# Patient Record
Sex: Female | Born: 1935
Health system: Southern US, Community
[De-identification: ages and names within clinical notes are randomized; demographics above are authoritative.]

## PROBLEM LIST (undated history)

## (undated) DIAGNOSIS — K219 Gastro-esophageal reflux disease without esophagitis: Secondary | ICD-10-CM

## (undated) DIAGNOSIS — K449 Diaphragmatic hernia without obstruction or gangrene: Secondary | ICD-10-CM

## (undated) DIAGNOSIS — M722 Plantar fascial fibromatosis: Secondary | ICD-10-CM

## (undated) DIAGNOSIS — E042 Nontoxic multinodular goiter: Secondary | ICD-10-CM

## (undated) DIAGNOSIS — I6529 Occlusion and stenosis of unspecified carotid artery: Secondary | ICD-10-CM

## (undated) DIAGNOSIS — Z0000 Encounter for general adult medical examination without abnormal findings: Secondary | ICD-10-CM

## (undated) DIAGNOSIS — I10 Essential (primary) hypertension: Secondary | ICD-10-CM

## (undated) DIAGNOSIS — J439 Emphysema, unspecified: Secondary | ICD-10-CM

## (undated) DIAGNOSIS — I209 Angina pectoris, unspecified: Secondary | ICD-10-CM

## (undated) DIAGNOSIS — E782 Mixed hyperlipidemia: Secondary | ICD-10-CM

## (undated) DIAGNOSIS — C801 Malignant (primary) neoplasm, unspecified: Secondary | ICD-10-CM

## (undated) DIAGNOSIS — N135 Crossing vessel and stricture of ureter without hydronephrosis: Secondary | ICD-10-CM

## (undated) DIAGNOSIS — E7800 Pure hypercholesterolemia, unspecified: Secondary | ICD-10-CM

## (undated) DIAGNOSIS — I671 Cerebral aneurysm, nonruptured: Secondary | ICD-10-CM

## (undated) DIAGNOSIS — K635 Polyp of colon: Secondary | ICD-10-CM

## (undated) DIAGNOSIS — D7589 Other specified diseases of blood and blood-forming organs: Secondary | ICD-10-CM

## (undated) DIAGNOSIS — R0600 Dyspnea, unspecified: Secondary | ICD-10-CM

## (undated) DIAGNOSIS — R911 Solitary pulmonary nodule: Principal | ICD-10-CM

## (undated) DIAGNOSIS — I479 Paroxysmal tachycardia, unspecified: Secondary | ICD-10-CM

## (undated) DIAGNOSIS — I471 Supraventricular tachycardia: Secondary | ICD-10-CM

## (undated) HISTORY — PX: CATARACT EXTRACTION W/ INTRAOCULAR LENS  IMPLANT, BILATERAL: SHX1307

## (undated) HISTORY — DX: Encounter for general adult medical examination without abnormal findings: Z00.00

## (undated) HISTORY — PX: TUBAL LIGATION: SHX77

## (undated) HISTORY — DX: Mixed hyperlipidemia: E78.2

## (undated) HISTORY — DX: Essential (primary) hypertension: I10

## (undated) HISTORY — DX: Supraventricular tachycardia: I47.1

## (undated) HISTORY — DX: Diaphragmatic hernia without obstruction or gangrene: K44.9

## (undated) HISTORY — DX: Gastro-esophageal reflux disease without esophagitis: K21.9

## (undated) HISTORY — DX: Pure hypercholesterolemia, unspecified: E78.00

## (undated) HISTORY — DX: Solitary pulmonary nodule: R91.1

## (undated) HISTORY — DX: Polyp of colon: K63.5

## (undated) HISTORY — DX: Cerebral aneurysm, nonruptured: I67.1

## (undated) HISTORY — DX: Occlusion and stenosis of unspecified carotid artery: I65.29

## (undated) HISTORY — DX: Plantar fascial fibromatosis: M72.2

## (undated) HISTORY — PX: CERVIX LESION DESTRUCTION: SHX591

## (undated) HISTORY — PX: BREAST LUMPECTOMY: SHX2

## (undated) HISTORY — DX: Other specified diseases of blood and blood-forming organs: D75.89

## (undated) HISTORY — DX: Paroxysmal tachycardia, unspecified: I47.9

## (undated) HISTORY — PX: BREAST EXCISIONAL BIOPSY: SUR124

---

## 2002-01-25 HISTORY — PX: CEREBRAL ANEURYSM REPAIR: SHX164

## 2004-07-13 ENCOUNTER — Ambulatory Visit: Payer: Self-pay | Admitting: Internal Medicine

## 2004-10-21 ENCOUNTER — Ambulatory Visit: Payer: Self-pay | Admitting: Unknown Physician Specialty

## 2005-04-20 ENCOUNTER — Other Ambulatory Visit: Payer: Self-pay

## 2005-04-20 ENCOUNTER — Emergency Department: Payer: Self-pay | Admitting: Emergency Medicine

## 2005-07-16 ENCOUNTER — Ambulatory Visit: Payer: Self-pay | Admitting: Internal Medicine

## 2006-02-22 ENCOUNTER — Ambulatory Visit: Payer: Self-pay | Admitting: Internal Medicine

## 2006-07-19 ENCOUNTER — Ambulatory Visit: Payer: Self-pay | Admitting: Internal Medicine

## 2007-07-20 ENCOUNTER — Ambulatory Visit: Payer: Self-pay | Admitting: Internal Medicine

## 2009-07-23 ENCOUNTER — Ambulatory Visit: Payer: Self-pay | Admitting: Internal Medicine

## 2010-08-06 ENCOUNTER — Ambulatory Visit: Payer: Self-pay | Admitting: Internal Medicine

## 2011-08-09 ENCOUNTER — Ambulatory Visit: Payer: Self-pay | Admitting: Internal Medicine

## 2011-11-03 ENCOUNTER — Ambulatory Visit: Payer: Self-pay | Admitting: Obstetrics and Gynecology

## 2011-11-03 LAB — BASIC METABOLIC PANEL
Calcium, Total: 8.9 mg/dL (ref 8.5–10.1)
Chloride: 108 mmol/L — ABNORMAL HIGH (ref 98–107)
EGFR (African American): >60
EGFR (Non-African Amer.): >60
Glucose: 79 mg/dL (ref 65–99)
Potassium: 3.7 mmol/L (ref 3.5–5.1)
Sodium: 143 mmol/L (ref 136–145)

## 2011-11-03 LAB — HEMOGLOBIN: HGB: 14.7 g/dL (ref 12.0–16.0)

## 2011-11-12 ENCOUNTER — Ambulatory Visit: Payer: Self-pay | Admitting: Obstetrics and Gynecology

## 2012-05-23 ENCOUNTER — Ambulatory Visit: Payer: Self-pay | Admitting: Internal Medicine

## 2012-08-09 ENCOUNTER — Ambulatory Visit: Payer: Self-pay | Admitting: Internal Medicine

## 2012-09-18 ENCOUNTER — Ambulatory Visit: Payer: Self-pay | Admitting: Unknown Physician Specialty

## 2012-11-29 ENCOUNTER — Ambulatory Visit: Payer: Self-pay | Admitting: Ophthalmology

## 2013-03-07 ENCOUNTER — Ambulatory Visit: Payer: Self-pay | Admitting: Ophthalmology

## 2013-06-19 DIAGNOSIS — I671 Cerebral aneurysm, nonruptured: Secondary | ICD-10-CM | POA: Insufficient documentation

## 2013-06-19 DIAGNOSIS — I6529 Occlusion and stenosis of unspecified carotid artery: Secondary | ICD-10-CM

## 2013-06-19 DIAGNOSIS — I471 Supraventricular tachycardia: Secondary | ICD-10-CM

## 2013-06-19 DIAGNOSIS — I4719 Other supraventricular tachycardia: Secondary | ICD-10-CM | POA: Insufficient documentation

## 2013-06-19 DIAGNOSIS — K219 Gastro-esophageal reflux disease without esophagitis: Secondary | ICD-10-CM | POA: Insufficient documentation

## 2013-06-19 HISTORY — DX: Other supraventricular tachycardia: I47.19

## 2013-06-19 HISTORY — DX: Supraventricular tachycardia: I47.1

## 2013-06-19 HISTORY — DX: Occlusion and stenosis of unspecified carotid artery: I65.29

## 2013-07-16 ENCOUNTER — Ambulatory Visit: Payer: Self-pay | Admitting: Internal Medicine

## 2013-08-23 ENCOUNTER — Ambulatory Visit: Payer: Self-pay | Admitting: Internal Medicine

## 2014-01-25 DIAGNOSIS — C3490 Malignant neoplasm of unspecified part of unspecified bronchus or lung: Secondary | ICD-10-CM

## 2014-01-25 HISTORY — DX: Malignant neoplasm of unspecified part of unspecified bronchus or lung: C34.90

## 2014-02-05 ENCOUNTER — Ambulatory Visit: Payer: Self-pay | Admitting: Internal Medicine

## 2014-02-13 ENCOUNTER — Ambulatory Visit: Payer: Self-pay | Admitting: Internal Medicine

## 2014-02-14 ENCOUNTER — Ambulatory Visit: Payer: Self-pay | Admitting: Cardiothoracic Surgery

## 2014-02-19 DIAGNOSIS — R918 Other nonspecific abnormal finding of lung field: Secondary | ICD-10-CM

## 2014-02-25 ENCOUNTER — Ambulatory Visit: Payer: Self-pay | Admitting: Cardiothoracic Surgery

## 2014-02-28 LAB — COMPREHENSIVE METABOLIC PANEL
ANION GAP: 9 (ref 7–16)
Albumin: 3.8 g/dL (ref 3.4–5.0)
Alkaline Phosphatase: 68 U/L (ref 46–116)
BILIRUBIN TOTAL: 0.5 mg/dL (ref 0.2–1.0)
BUN: 15 mg/dL (ref 7–18)
CHLORIDE: 104 mmol/L (ref 98–107)
Calcium, Total: 9.2 mg/dL (ref 8.5–10.1)
Co2: 31 mmol/L (ref 21–32)
Creatinine: 0.82 mg/dL (ref 0.60–1.30)
EGFR (African American): >60
EGFR (Non-African Amer.): >60
Glucose: 109 mg/dL — ABNORMAL HIGH (ref 65–99)
Osmolality: 288 (ref 275–301)
POTASSIUM: 4.1 mmol/L (ref 3.5–5.1)
SGOT(AST): 28 U/L (ref 15–37)
SGPT (ALT): 37 U/L (ref 14–63)
SODIUM: 144 mmol/L (ref 136–145)
TOTAL PROTEIN: 7 g/dL (ref 6.4–8.2)

## 2014-02-28 LAB — CBC CANCER CENTER
BASOS ABS: 0 x10 3/mm (ref 0.0–0.1)
Basophil %: 0.6 %
EOS ABS: 0.2 x10 3/mm (ref 0.0–0.7)
EOS PCT: 3.8 %
HCT: 43.7 % (ref 35.0–47.0)
HGB: 14.8 g/dL (ref 12.0–16.0)
LYMPHS PCT: 29.8 %
Lymphocyte #: 1.5 x10 3/mm (ref 1.0–3.6)
MCH: 34.3 pg — ABNORMAL HIGH (ref 26.0–34.0)
MCHC: 33.8 g/dL (ref 32.0–36.0)
MCV: 102 fL — AB (ref 80–100)
MONOS PCT: 8.9 %
Monocyte #: 0.4 x10 3/mm (ref 0.2–0.9)
NEUTROS ABS: 2.9 x10 3/mm (ref 1.4–6.5)
Neutrophil %: 56.9 %
Platelet: 176 x10 3/mm (ref 150–440)
RBC: 4.3 10*6/uL (ref 3.80–5.20)
RDW: 12.6 % (ref 11.5–14.5)
WBC: 5 x10 3/mm (ref 3.6–11.0)

## 2014-02-28 LAB — PROTIME-INR
INR: 1
Prothrombin Time: 13.4 secs

## 2014-02-28 LAB — APTT: Activated PTT: 25.8 secs (ref 23.6–35.9)

## 2014-03-07 ENCOUNTER — Ambulatory Visit: Payer: Self-pay

## 2014-03-13 ENCOUNTER — Inpatient Hospital Stay: Payer: Self-pay

## 2014-03-13 DIAGNOSIS — C3411 Malignant neoplasm of upper lobe, right bronchus or lung: Secondary | ICD-10-CM | POA: Insufficient documentation

## 2014-03-13 HISTORY — PX: OTHER SURGICAL HISTORY: SHX169

## 2014-03-26 ENCOUNTER — Ambulatory Visit
Admit: 2014-03-26 | Disposition: A | Discharge status: Home / Self Care | Payer: Self-pay | Attending: Cardiothoracic Surgery | Admitting: Cardiothoracic Surgery

## 2014-03-31 DIAGNOSIS — I479 Paroxysmal tachycardia, unspecified: Secondary | ICD-10-CM

## 2014-03-31 HISTORY — DX: Paroxysmal tachycardia, unspecified: I47.9

## 2014-04-26 ENCOUNTER — Ambulatory Visit
Admit: 2014-04-26 | Disposition: A | Discharge status: Home / Self Care | Payer: Self-pay | Attending: Cardiothoracic Surgery | Admitting: Cardiothoracic Surgery

## 2014-05-14 NOTE — Op Note (Signed)
PATIENT NAMEMACARENA, Heather [name] MR#:  [id number] DATE OF BIRTH:  1935/08/05  DATE OF PROCEDURE:  11/12/2011  PREOPERATIVE DIAGNOSIS: Postmenopausal bleeding.   POSTOPERATIVE DIAGNOSIS: Postmenopausal bleeding.   PROCEDURES:  1. Fractional dilation and curettage.  2. Hysteroscopy.   ANESTHESIA: General endotracheal anesthesia.   SURGEON: Boykin Nearing, MD    INDICATIONS: This is a 79 year old gravida 2 para 2 patient with postmenopausal bleeding, possible polyps on endometrial biopsy and proliferative endometrium noted.   PROCEDURE: After adequate general endotracheal anesthesia, the patient was placed in the dorsal supine position with the legs in candy-cane stirrups. The patient had previously received 2 grams Ancef and 90 mg of gentamicin intravenously prior to commencement of the case. The patient's bladder was catheterized. A weighted speculum was placed in the posterior vaginal vault. Anterior cervix was grasped with a single-tooth tenaculum. Uterus sounds to 7.5 cm. Endocervical curettage was performed followed by dilation of the cervix with Hanks dilator to #17 Hanks dilator. Hysteroscope was advanced into the endometrial cavity under direct visualization with lactated Ringer's distending medium. Endometrial cavity was smooth and no evidence of polyps. No defects noted. Pictures taken. Hysteroscope was removed and sharp curettage performed with scant tissue removed. Good hemostasis was noted. The procedure was terminated. There were no complications. Estimated blood loss minimal. Intraoperative fluids 600 mL. The patient tolerated the procedure well and was taken to the recovery room in good condition.   ____________________________ Boykin Nearing, MD tjs:drc D: 11/12/2011 08:19:15 ET T: 11/12/2011 10:48:13 ET JOB#: [id number] cc: Boykin Nearing, MD, <Dictator> Boykin Nearing MD ELECTRONICALLY SIGNED 11/13/2011 10:30

## 2014-05-20 LAB — SURGICAL PATHOLOGY

## 2014-05-26 NOTE — Consult Note (Signed)
PATIENT NAMEMARCHELLA, [name] MR#:  [id number] DATE OF BIRTH:  [birth year]  DATE OF CONSULTATION:  03/13/2014  CONSULTING PHYSICIAN:  Phelicia Dantes R. Trigo Winterbottom, MD  PRIMARY CARE PHYSICIAN: Emily Filbert, MD  CONSULTING PHYSICIAN:  Nestor Lewandowsky, MD  REASON FOR CONSULTATION: Hypertension and emphysema.   HISTORY OF PRESENTING ILLNESS: A 79 year old Caucasian female patient with history of hypertension, chronic obstructive pulmonary disease, who was recently diagnosed with right upper lobe mass in her lung, most consistent with an adenocarcinoma is admitted to the Critical Care Unit by Dr. Genevive Bi status post thoracotomy with a chest tube in place. The patient mentions that she is pain-free but on minimal movement she has significant pain. Does not complain of any significant shortness of breath. She does have chronic mild cough which nonproductive. Her blood pressure has been fairly controlled as outpatient. She quit smoking when she was 52. She is presently retired. Ambulates on her own.   Today her blood pressure after surgery is 129/64 with pulse of 73 and she is saturating 93% on 2 liters oxygen.   PAST MEDICAL HISTORY:  1.  Hypertension.  2.  Aneurysm.  3.  Hyperlipidemia.  4.  Chronic obstructive pulmonary disease.  5.  Past tobacco abuse.  6.  Right upper lobe lung mass consistent with adenocarcinoma.   ALLERGIES: DEMEROL AND IODINATED CONTRAST.   HOME MEDICATIONS: Zyrtec 10 mg daily, Zantac 150 mg 2 times a day, Hydrochlorothiazide 25 mg daily, Coenzyme Q 10 mg daily, Folic acid 8,144 International Units daily, Atorvastatin 40 mg daily, Atenolol 50 mg daily.  FAMILY HISTORY: Negative for any lung, colon, or breast cancer. Mother deceased of alcohol related complications. Father deceased from myocardial infarction.   REVIEW OF SYSTEMS: CONSTITUTIONAL:  Some fatigue.  EYES: No blurred vision, pain, or redness.  EARS, NOSE, AND THROAT: No tinnitus, ear pain, or hearing loss.   RESPIRATORY:  No  cough, wheeze, or hematemesis.  CARDIOVASCULAR: Has chest pain from her chest tube.  RESPIRATORY: Has chronic obstructive pulmonary disease.  GASTROINTESTINAL: No nausea, vomiting, diarrhea, or abdominal pain.  GENITOURINARY: No dysuria, hematuria, or frequency.  ENDOCRINE: No polyuria, nocturia, or thyroid problems.  HEMATOLOGIC AND LYMPHATIC: No anemia, easy bruising, or bleeding.  INTEGUMENT: No acne, rash, or lesion.  MUSCULOSKELETAL: No back pain or arthritis.  NEUROLOGIC: No focal numbness or weakness.  PSYCHIATRIC: No anxiety or depression.   PHYSICAL EXAMINATION:  VITAL SIGNS: Temperature 97.6, pulse 73, respirations 20, blood pressure 129/64, saturating 92% on 2 liters.  GENERAL: Moderately built Caucasian female, the patient lying in bed, overall seems comfortable.  PSYCHIATRIC: Alert and oriented x 3. Mood and affect appropriate. Judgment intact.  HEENT: Atraumatic, normocephalic. Oral mucosa moist and pink. External ears and nose normal. No pallor or icterus. Pupils are equal and reactive to light.  NECK: Supple. No thyromegaly. No palpable lymph nodes. Trachea midline. No carotid bruits or jugular venous distention.  CARDIOVASCULAR: S1, S2, without any murmurs. Peripheral pulses 2+. No edema.  RESPIRATORY: Normal work of breathing. Clear to auscultation on both sides, has a chest tube in place on the right side.  GASTROINTESTINAL: Soft abdomen, nontender. Bowel sounds present. No organomegaly palpable.  SKIN: Warm and dry. No petechiae, rash, or ulcers.  MUSCULOSKELETAL: No joint swelling, redness, effusion of the large joints. Normal muscle tone.  NEUROLOGICAL:  Moves all 4 extremities equally.   LABORATORY STUDIES: Glucose from today was 179. Recently done CBC and complete metabolic panel have been reviewed, which were normal. Her blood group  is A+. Also CT scan done in January 2016 has been reviewed.   ASSESSMENT AND PLAN:  1.  Hypertension. The patient is presently  started on atenolol which I agree with. We will hold her hydrochlorothiazide. If her blood pressure trends up can restart her hydrochlorothiazide at 25 mg daily. We will add labetalol IV p.r.n. at this point. We will monitor her blood pressure.  2.  Right upper lobe lung mass status post thoracotomy. Further management as per Dr. Genevive Bi with a chest tube.  3.  Deep vein thrombosis prophylaxis. The patient has SCDs in place. I would start Lovenox at the earliest possible time per Dr. Genevive Bi.   Time Spent on this consult was 40 minutes.   ___________________________ [name], MD srs:mc D: 03/13/2014 14:23:47 ET T: 03/13/2014 14:49:39 ET JOB#: [id number]  cc: Alveta Heimlich R. Labrenda Lasky, MD, <Dictator> Timothy E. Genevive Bi, MD Rusty Aus, MD  Neita Carp MD ELECTRONICALLY SIGNED 03/14/2014 16:28

## 2014-05-26 NOTE — Discharge Summary (Signed)
PATIENT NAMEMURANDA, Heather [name] MR#:  [id number] DATE OF BIRTH:  November 21, 1935  DATE OF ADMISSION:  03/13/2014 DATE OF DISCHARGE:  03/19/2014  ADMITTING DIAGNOSIS: Right upper lobe mass.   DISCHARGE DIAGNOSIS: Adenocarcinoma, of the lung T2, N0, M0.   HOSPITAL COURSE: Ms. Heather [name] is a 79 year old female with a recent history of a right upper lobe nodule discovered on x-ray. She had a PET scan done, which suggested a malignancy in the right upper lobe and she was brought to the operating room on 03/13/2014. She underwent a preoperative bronchoscopy, right thoracotomy, and right upper lobe wedge resection. Wedge resection returned a non-small cell carcinoma of the lung with negative margins. Because of the patient's desire to undergo a minimal resection if at all possible we elected to stop at that point. The patient was nursed in the intensive care unit overnight and then monitored on the floor for several days. Her chest tube was ultimately removed on 03/19/2014. Her chest x-ray looked good after chest tube removal and she was discharged to home. At the time of discharge, her wounds were healing as expected. Her chest x-ray showed no pneumothorax or pleural effusion. She was discharged on atenolol, atorvastatin, hydrochlorothiazide, lisinopril, Zyrtec, vitamin D, Zantac, and coenzyme Q10. She was also given a prescription for pain medication and will follow up with Dr. Genevive Bi in one week.    ____________________________ Lew Dawes. Genevive Bi, MD teo:jh D: 03/29/2014 10:16:47 ET T: 03/29/2014 13:03:17 ET JOB#: [id number]  cc: Christia Reading E. Genevive Bi, MD, <Dictator> Louis Matte MD ELECTRONICALLY SIGNED 05/06/2014 12:06

## 2014-05-26 NOTE — Op Note (Signed)
PATIENT NAME:  Heather [name], Heather [name] MR#:  [id number] DATE OF BIRTH:  January 11, 1936  DATE OF PROCEDURE:  03/13/2014  SURGEON: Louis Matte, M.D.   ASSISTANT: Bronson Ing, M.D.   PREOPERATIVE DIAGNOSIS: Right upper lobe mass.  POSTOPERATIVE DIAGNOSIS:  Right upper lobe mass (frozen section consistent with non-small cell carcinoma of the lung).   OPERATION PERFORMED:  1.  Preoperative bronchoscopy to assess endobronchial anatomy.  2.  Right thoracotomy with wedge resection of right upper lobe mass.   INDICATIONS FOR PROCEDURE: [name] is a 79 year old woman with a recent discovery of a right upper lobe mass which was less than a centimeter in size and was PET positive. The indications and risks of the procedure were explained to the patient who gave her informed consent. We discussed all the various options including wedge resection, lobectomy, and nonoperative management including radiation therapy. She elected to proceed with surgery and wedge resection if that was possible.    DESCRIPTION OF PROCEDURE: The patient was brought to the operating suite and placed in the supine position. General endotracheal anesthesia was given with a double-lumen tube. Preoperative bronchoscopy was carried out. This was normal to the subsegmental levels bilaterally. The patient was then turned for a right thoracotomy. All pressure points were carefully padded. The patient was prepped and draped in the usual sterile fashion. A limited lateral 5th interspace thoracotomy was performed. The latissimus muscle was divided but the serratus was spared. The chest was entered through the 5th interspace. We freed up the inferior pulmonary ligament and then had a good fill of the lung. There was a less than 1 cm mass present within the right upper lobe. This was easily palpable. The second smaller rounded lesion that had been seen on the CT scan was not palpable. The fissure between the upper and middle lobes was nonexistent. The tumor was  wedged from the lung and sent for frozen section. The frozen section was consistent with a non-small cell carcinoma. There was a 5 mm margin from the stapled line after the staples were removed. Therefore, this gave [place] approximately an 8 mm margin and I felt comfortable with that. The subcarinal space was opened and there were only a few very small lymph nodes present. There was a very small lymph node in the hilum. These were sent. The pretracheal space had no obvious lymph nodes to sample. The chest was drained with 2 chest tubes. These were secured in the standard fashion. The ribs were reapproximated with #2 Vicryl. The muscles of the chest wall were closed with #2 Vicryl, subcutaneous tissues with 2-0 Vicryl, and the skin with skin clips. The patient tolerated the procedure well and was rolled in the supine position where she was extubated and taken to the recovery room in stable condition.    ____________________________ Lew Dawes Genevive Bi, MD teo:mc D: 03/13/2014 13:59:46 ET T: 03/13/2014 14:25:47 ET JOB#: [id number]  cc: Christia Reading E. Genevive Bi, MD, <Dictator> Louis Matte MD ELECTRONICALLY SIGNED 03/15/2014 9:53

## 2014-06-11 ENCOUNTER — Encounter: Payer: Self-pay | Admitting: *Deleted

## 2014-06-21 ENCOUNTER — Other Ambulatory Visit: Payer: Self-pay

## 2014-06-21 DIAGNOSIS — R911 Solitary pulmonary nodule: Secondary | ICD-10-CM

## 2014-06-21 HISTORY — DX: Solitary pulmonary nodule: R91.1

## 2014-06-25 ENCOUNTER — Other Ambulatory Visit: Payer: Self-pay | Admitting: Internal Medicine

## 2014-06-25 DIAGNOSIS — Z1231 Encounter for screening mammogram for malignant neoplasm of breast: Secondary | ICD-10-CM

## 2014-06-28 ENCOUNTER — Ambulatory Visit
Admission: RE | Admit: 2014-06-28 | Discharge: 2014-06-28 | Disposition: A | Discharge status: Home / Self Care | Payer: PPO | Source: Ambulatory Visit | Attending: Cardiothoracic Surgery | Admitting: Cardiothoracic Surgery

## 2014-06-28 ENCOUNTER — Other Ambulatory Visit: Payer: Self-pay | Admitting: Cardiothoracic Surgery

## 2014-06-28 ENCOUNTER — Inpatient Hospital Stay: Payer: PPO | Attending: Hematology and Oncology

## 2014-06-28 ENCOUNTER — Inpatient Hospital Stay (HOSPITAL_BASED_OUTPATIENT_CLINIC_OR_DEPARTMENT_OTHER): Payer: PPO | Admitting: Hematology and Oncology

## 2014-06-28 VITALS — BP 156/74 | HR 70 | Temp 96.7°F | Wt 148.6 lb

## 2014-06-28 DIAGNOSIS — C3411 Malignant neoplasm of upper lobe, right bronchus or lung: Secondary | ICD-10-CM | POA: Diagnosis not present

## 2014-06-28 DIAGNOSIS — R000 Tachycardia, unspecified: Secondary | ICD-10-CM

## 2014-06-28 DIAGNOSIS — K219 Gastro-esophageal reflux disease without esophagitis: Secondary | ICD-10-CM | POA: Insufficient documentation

## 2014-06-28 DIAGNOSIS — I6529 Occlusion and stenosis of unspecified carotid artery: Secondary | ICD-10-CM

## 2014-06-28 DIAGNOSIS — Z8669 Personal history of other diseases of the nervous system and sense organs: Secondary | ICD-10-CM | POA: Insufficient documentation

## 2014-06-28 DIAGNOSIS — Z87891 Personal history of nicotine dependence: Secondary | ICD-10-CM

## 2014-06-28 DIAGNOSIS — Z79899 Other long term (current) drug therapy: Secondary | ICD-10-CM

## 2014-06-28 DIAGNOSIS — K449 Diaphragmatic hernia without obstruction or gangrene: Secondary | ICD-10-CM | POA: Diagnosis not present

## 2014-06-28 DIAGNOSIS — R911 Solitary pulmonary nodule: Secondary | ICD-10-CM

## 2014-06-28 DIAGNOSIS — Z86010 Personal history of colonic polyps: Secondary | ICD-10-CM | POA: Insufficient documentation

## 2014-06-28 DIAGNOSIS — I10 Essential (primary) hypertension: Secondary | ICD-10-CM | POA: Diagnosis not present

## 2014-06-28 DIAGNOSIS — E780 Pure hypercholesterolemia: Secondary | ICD-10-CM | POA: Diagnosis not present

## 2014-06-28 DIAGNOSIS — Z9889 Other specified postprocedural states: Secondary | ICD-10-CM | POA: Diagnosis not present

## 2014-06-28 LAB — COMPREHENSIVE METABOLIC PANEL
ALT: 26 U/L (ref 14–54)
AST: 27 U/L (ref 15–41)
Albumin: 4.4 g/dL (ref 3.5–5.0)
Alkaline Phosphatase: 60 U/L (ref 38–126)
Anion gap: 5 (ref 5–15)
BUN: 26 mg/dL — ABNORMAL HIGH (ref 6–20)
CO2: 26 mmol/L (ref 22–32)
Calcium: 9.1 mg/dL (ref 8.9–10.3)
Chloride: 104 mmol/L (ref 101–111)
Creatinine, Ser: 0.79 mg/dL (ref 0.44–1.00)
GFR calc Af Amer: >60 mL/min (ref >60)
GFR calc non Af Amer: >60 mL/min (ref >60)
Glucose, Bld: 152 mg/dL — ABNORMAL HIGH (ref 65–99)
Potassium: 3.9 mmol/L (ref 3.5–5.1)
Sodium: 135 mmol/L (ref 135–145)
Total Bilirubin: 0.7 mg/dL (ref 0.3–1.2)
Total Protein: 6.7 g/dL (ref 6.5–8.1)

## 2014-06-28 LAB — CBC
HCT: 45 % (ref 35.0–47.0)
Hemoglobin: 15.1 g/dL (ref 12.0–16.0)
MCH: 33.5 pg (ref 26.0–34.0)
MCHC: 33.6 g/dL (ref 32.0–36.0)
MCV: 99.8 fL (ref 80.0–100.0)
Platelets: 179 10*3/uL (ref 150–440)
RBC: 4.51 MIL/uL (ref 3.80–5.20)
RDW: 13.4 % (ref 11.5–14.5)
WBC: 6.8 10*3/uL (ref 3.6–11.0)

## 2014-06-29 LAB — CEA: CEA: 3.5 ng/mL (ref 0.0–4.7)

## 2014-07-18 ENCOUNTER — Ambulatory Visit
Admission: RE | Admit: 2014-07-18 | Discharge: 2014-07-18 | Disposition: A | Discharge status: Home / Self Care | Payer: PPO | Source: Ambulatory Visit | Attending: Internal Medicine | Admitting: Internal Medicine

## 2014-07-18 ENCOUNTER — Other Ambulatory Visit: Payer: Self-pay | Admitting: Internal Medicine

## 2014-07-18 DIAGNOSIS — Z1231 Encounter for screening mammogram for malignant neoplasm of breast: Secondary | ICD-10-CM

## 2014-07-18 HISTORY — DX: Malignant (primary) neoplasm, unspecified: C80.1

## 2014-09-27 ENCOUNTER — Ambulatory Visit
Admission: RE | Admit: 2014-09-27 | Discharge: 2014-09-27 | Disposition: A | Discharge status: Home / Self Care | Payer: PPO | Source: Ambulatory Visit | Attending: Hematology and Oncology | Admitting: Hematology and Oncology

## 2014-09-27 ENCOUNTER — Ambulatory Visit: Admission: RE | Admit: 2014-09-27 | Payer: PPO | Source: Ambulatory Visit

## 2014-09-27 DIAGNOSIS — J439 Emphysema, unspecified: Secondary | ICD-10-CM | POA: Insufficient documentation

## 2014-09-27 DIAGNOSIS — Z08 Encounter for follow-up examination after completed treatment for malignant neoplasm: Secondary | ICD-10-CM | POA: Insufficient documentation

## 2014-09-27 DIAGNOSIS — R918 Other nonspecific abnormal finding of lung field: Secondary | ICD-10-CM | POA: Insufficient documentation

## 2014-09-27 DIAGNOSIS — N1330 Unspecified hydronephrosis: Secondary | ICD-10-CM | POA: Diagnosis not present

## 2014-09-27 DIAGNOSIS — Z85118 Personal history of other malignant neoplasm of bronchus and lung: Secondary | ICD-10-CM | POA: Diagnosis present

## 2014-09-27 DIAGNOSIS — Z9889 Other specified postprocedural states: Secondary | ICD-10-CM | POA: Diagnosis not present

## 2014-09-27 DIAGNOSIS — C3411 Malignant neoplasm of upper lobe, right bronchus or lung: Secondary | ICD-10-CM

## 2014-09-29 NOTE — Progress Notes (Signed)
[facility] day:  06/28/2014  Chief Complaint: Heather [name] is a 79 y.o. 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  HPI: The patient was last seen in the medical oncology clinic on 03/28/2014.  At that time, she was seen in consultation  regarding adjuvant chemotherapy.  We discussed high risk criteria for stage IB disease including poorly differentiated, vascular invasion, wedge resection or visceral pleural involvement. She had both high-grade tumor as well as visceral pleural involvement. The LACE meta-analysis was discussed as well as data from CALGB 9633 trial.  Side effects of chemotherapy were reviewed. She felt comfortable not receiving therapy given her age and small risk of benefit.  We discussed the plan for close follow-up with CT scans every 6-12 months for the first 2 years then low-dose spiral chest CTs.  We discussed intervening chest x-rays.  At last visit, she was doing well.  Exam was unremarkable except for a fungal rash beneath her breasts.  She was given a prescription for Nystatin.  During the interim, she has continued to do well.  She feels great today. She states that she is going back to the gym.   CXR today reveals post-operative changes on the right with upper lobe scarring.  Past Medical History  Diagnosis Date  . Tachycardia, paroxysmal 03/31/14  . Carotid artery stenosis   . Brain aneurysm   . Colonic polyp   . Hiatal hernia   . GERD (gastroesophageal reflux disease)   . Plantar fasciitis   . Hypertension   . Hypercholesteremia   . Lung nodule 06/21/2014  . Cancer lung    2016    Past Surgical History  Procedure Laterality Date  . Cervix lesion destruction    . Breast lumpectomy      benign  . Tubal ligation    . Breast biopsy Right     neg    Family History  Problem Relation Age of Onset  . Alcohol abuse Mother   . Heart  attack Father     Social History:  reports that she quit smoking about 26 years ago. She does not have any smokeless tobacco history on file. She reports that she drinks alcohol. She reports that she does not use illicit drugs.  The patient is alone today.  Allergies:  Allergies  Allergen Reactions  . Contrast Media [Iodinated Diagnostic Agents]     Current Medications: Current Outpatient Prescriptions  Medication Sig Dispense Refill  . atenolol (TENORMIN) 50 MG tablet Take 50 mg by mouth at bedtime.    Marland Kitchen atorvastatin (LIPITOR) 40 MG tablet Take 40 mg by mouth daily at 6 PM.    . cetirizine (ZYRTEC) 10 MG tablet Take 10 mg by mouth daily.    . Cholecalciferol 1000 UNITS tablet Take 1,000 Units by mouth daily.    . Coenzyme Q10 (CO Q 10) 100 MG CAPS Take 1 capsule by mouth daily.    . hydrochlorothiazide (HYDRODIURIL) 25 MG tablet Take 25 mg by mouth daily.    Marland Kitchen lisinopril-hydrochlorothiazide (PRINZIDE,ZESTORETIC) 20-12.5 MG per tablet Take 1 tablet by mouth daily.    . ranitidine (ZANTAC) 150 MG tablet Take 150 mg by mouth 2 (two) times daily.    Marland Kitchen RESVERATROL PO Take 1 capsule by mouth daily.    Marland Kitchen nystatin (MYCOSTATIN/NYSTOP) 100000 UNIT/GM POWD Apply 1 Bottle topically 3 (three) times daily.  No current facility-administered medications for this visit.    Review of Systems:  GENERAL:  Feels great.  Active.  No fevers, sweats or weight loss. PERFORMANCE STATUS (ECOG):  0 HEENT:  No visual changes, runny nose, sore throat, mouth sores or tenderness. Lungs: No shortness of breath or cough.  No hemoptysis. Cardiac:  No chest pain, palpitations, orthopnea, or PND. GI:  No nausea, vomiting, diarrhea, constipation, melena or hematochezia. GU:  No urgency, frequency, dysuria, or hematuria. Musculoskeletal:  No back pain.  No joint pain.  No muscle tenderness. Extremities:  No pain or swelling. Skin:  No rashes or skin changes. Neuro:  No headache, numbness or weakness, balance or  coordination issues. Endocrine:  No diabetes, thyroid issues, hot flashes or night sweats. Psych:  No mood changes, depression or anxiety. Pain:  No focal pain. Review of systems:  All other systems reviewed and found to be negative.  Physical Exam: Blood pressure 156/74, pulse 70, temperature 96.7 F (35.9 C), temperature source Tympanic, weight 148 lb 9.4 oz (67.4 kg). GENERAL:  Well developed, well nourished, sitting comfortably in the exam room in no acute distress. MENTAL STATUS:  Alert and oriented to person, place and time. HEAD:  Styled auburn hair.  Normocephalic, atraumatic, face symmetric, no Cushingoid features. EYES:  Brown eyes.  Pupils equal round and reactive to light and accomodation.  No conjunctivitis or scleral icterus. ENT:  Oropharynx clear without lesion.  Tongue normal. Mucous membranes moist.  RESPIRATORY:  Clear to auscultation without rales, wheezes or rhonchi. CARDIOVASCULAR:  Regular rate and rhythm without murmur, rub or gallop. ABDOMEN:  Soft, non-tender, with active bowel sounds, and no hepatosplenomegaly.  No masses. SKIN:  No rashes, ulcers or lesions. EXTREMITIES: No edema, no skin discoloration or tenderness.  No palpable cords. LYMPH NODES: No palpable cervical, supraclavicular, axillary or inguinal adenopathy  NEUROLOGICAL: Unremarkable. PSYCH:  Appropriate.  Appointment on 06/28/2014  Component Date Value Ref Range Status  . WBC 06/28/2014 6.8  3.6 - 11.0 K/uL Final  . RBC 06/28/2014 4.51  3.80 - 5.20 MIL/uL Final  . Hemoglobin 06/28/2014 15.1  12.0 - 16.0 g/dL Final  . HCT 06/28/2014 45.0  35.0 - 47.0 % Final  . MCV 06/28/2014 99.8  80.0 - 100.0 fL Final  . MCH 06/28/2014 33.5  26.0 - 34.0 pg Final  . MCHC 06/28/2014 33.6  32.0 - 36.0 g/dL Final  . RDW 06/28/2014 13.4  11.5 - 14.5 % Final  . Platelets 06/28/2014 179  150 - 440 K/uL Final  . Sodium 06/28/2014 135  135 - 145 mmol/L Final  . Potassium 06/28/2014 3.9  3.5 - 5.1 mmol/L Final  .  Chloride 06/28/2014 104  101 - 111 mmol/L Final  . CO2 06/28/2014 26  22 - 32 mmol/L Final  . Glucose, Bld 06/28/2014 152* 65 - 99 mg/dL Final  . BUN 06/28/2014 26* 6 - 20 mg/dL Final  . Creatinine, Ser 06/28/2014 0.79  0.44 - 1.00 mg/dL Final  . Calcium 06/28/2014 9.1  8.9 - 10.3 mg/dL Final  . Total Protein 06/28/2014 6.7  6.5 - 8.1 g/dL Final  . Albumin 06/28/2014 4.4  3.5 - 5.0 g/dL Final  . AST 06/28/2014 27  15 - 41 U/L Final  . ALT 06/28/2014 26  14 - 54 U/L Final  . Alkaline Phosphatase 06/28/2014 60  38 - 126 U/L Final  . Total Bilirubin 06/28/2014 0.7  0.3 - 1.2 mg/dL Final  . GFR calc non Af Amer 06/28/2014 >60  >  60 mL/min Final  . GFR calc Af Amer 06/28/2014 >60  >60 mL/min Final   Comment: (NOTE) The eGFR has been calculated using the CKD EPI equation. This calculation has not been validated in all clinical situations. eGFR's persistently <60 mL/min signify possible Chronic Kidney Disease.   . Anion gap 06/28/2014 5  5 - 15 Final  . CEA 06/28/2014 3.5  0.0 - 4.7 ng/mL Final   Comment: (NOTE)       Roche ECLIA methodology       Nonsmokers  <3.9                                     Smokers     <5.6 Performed At: [facility] [address] [place], [place] [id number] Lindon Romp MD [id number]     Assessment:  JAZSMINE MACARI is a 79 y.o. female with stage IB and adenocarcinoma of the right upper lobe status post wedge resection on 03/13/2014. Pathology revealed a 1.1 cm high-grade adenocarcinoma with pleural invasion. Nodes were negative. Pathologic stage was T2aN1M0 (stage IB).  PET scan on 02/13/2014 revealed a 1 cm right upper lobe hypermetabolic nodule (SUV 3.8) and a 6 mm right upper lobe nodule (no metabolic activity). There was no mediastianl adenopathy.  CXR on 06/28/2014 revealed post-operative changes on the right with upper lobe scarring.   Symptomatically, she is doing well. Exam is unremarkable.  Plan: 1. Labs today:  CBC with diff, CMP,  CEA. 2. Schedule chest CT in 3 months. 3. RTC in 3 months for MD assessment, labs (CBC, CMP, CEA), and review of chest CT.   Lequita Asal, MD  06/28/2014

## 2014-09-30 ENCOUNTER — Encounter: Payer: Self-pay | Admitting: Hematology and Oncology

## 2014-10-01 ENCOUNTER — Inpatient Hospital Stay: Payer: PPO | Attending: Hematology and Oncology | Admitting: Hematology and Oncology

## 2014-10-01 ENCOUNTER — Inpatient Hospital Stay: Payer: PPO

## 2014-10-01 VITALS — BP 154/84 | HR 66 | Temp 98.2°F | Ht 62.0 in | Wt 147.7 lb

## 2014-10-01 DIAGNOSIS — C3411 Malignant neoplasm of upper lobe, right bronchus or lung: Secondary | ICD-10-CM

## 2014-10-01 DIAGNOSIS — K219 Gastro-esophageal reflux disease without esophagitis: Secondary | ICD-10-CM | POA: Diagnosis not present

## 2014-10-01 DIAGNOSIS — M722 Plantar fascial fibromatosis: Secondary | ICD-10-CM | POA: Insufficient documentation

## 2014-10-01 DIAGNOSIS — I6529 Occlusion and stenosis of unspecified carotid artery: Secondary | ICD-10-CM | POA: Diagnosis not present

## 2014-10-01 DIAGNOSIS — E785 Hyperlipidemia, unspecified: Secondary | ICD-10-CM | POA: Insufficient documentation

## 2014-10-01 DIAGNOSIS — Z87891 Personal history of nicotine dependence: Secondary | ICD-10-CM

## 2014-10-01 DIAGNOSIS — K449 Diaphragmatic hernia without obstruction or gangrene: Secondary | ICD-10-CM | POA: Insufficient documentation

## 2014-10-01 DIAGNOSIS — R911 Solitary pulmonary nodule: Secondary | ICD-10-CM

## 2014-10-01 DIAGNOSIS — Z86010 Personal history of colonic polyps: Secondary | ICD-10-CM | POA: Insufficient documentation

## 2014-10-01 DIAGNOSIS — Z79899 Other long term (current) drug therapy: Secondary | ICD-10-CM | POA: Diagnosis not present

## 2014-10-01 DIAGNOSIS — I10 Essential (primary) hypertension: Secondary | ICD-10-CM | POA: Diagnosis not present

## 2014-10-01 DIAGNOSIS — Z8673 Personal history of transient ischemic attack (TIA), and cerebral infarction without residual deficits: Secondary | ICD-10-CM | POA: Insufficient documentation

## 2014-10-01 DIAGNOSIS — D7589 Other specified diseases of blood and blood-forming organs: Secondary | ICD-10-CM

## 2014-10-01 LAB — CBC WITH DIFFERENTIAL/PLATELET
Basophils Absolute: 0 10*3/uL (ref 0–0.1)
Basophils Relative: 0 %
Eosinophils Absolute: 0.3 10*3/uL (ref 0–0.7)
Eosinophils Relative: 5 %
HCT: 43.1 % (ref 35.0–47.0)
Hemoglobin: 14.7 g/dL (ref 12.0–16.0)
Lymphocytes Relative: 35 %
Lymphs Abs: 1.9 10*3/uL (ref 1.0–3.6)
MCH: 34.8 pg — ABNORMAL HIGH (ref 26.0–34.0)
MCHC: 34.1 g/dL (ref 32.0–36.0)
MCV: 101.9 fL — ABNORMAL HIGH (ref 80.0–100.0)
Monocytes Absolute: 0.5 10*3/uL (ref 0.2–0.9)
Monocytes Relative: 8 %
Neutro Abs: 2.8 10*3/uL (ref 1.4–6.5)
Neutrophils Relative %: 52 %
Platelets: 170 10*3/uL (ref 150–440)
RBC: 4.23 MIL/uL (ref 3.80–5.20)
RDW: 13.6 % (ref 11.5–14.5)
WBC: 5.4 10*3/uL (ref 3.6–11.0)

## 2014-10-01 LAB — COMPREHENSIVE METABOLIC PANEL
ALT: 22 U/L (ref 14–54)
AST: 28 U/L (ref 15–41)
Albumin: 4.1 g/dL (ref 3.5–5.0)
Alkaline Phosphatase: 53 U/L (ref 38–126)
Anion gap: 7 (ref 5–15)
BUN: 20 mg/dL (ref 6–20)
CO2: 30 mmol/L (ref 22–32)
Calcium: 8.9 mg/dL (ref 8.9–10.3)
Chloride: 103 mmol/L (ref 101–111)
Creatinine, Ser: 0.82 mg/dL (ref 0.44–1.00)
GFR calc Af Amer: >60 mL/min (ref >60)
GFR calc non Af Amer: >60 mL/min (ref >60)
Glucose, Bld: 88 mg/dL (ref 65–99)
Potassium: 3.9 mmol/L (ref 3.5–5.1)
Sodium: 140 mmol/L (ref 135–145)
Total Bilirubin: 0.7 mg/dL (ref 0.3–1.2)
Total Protein: 6.9 g/dL (ref 6.5–8.1)

## 2014-10-01 NOTE — Progress Notes (Signed)
Patient here for follow up minor complaints of aching at surgical site otherwise feels good.

## 2014-10-01 NOTE — Progress Notes (Signed)
[facility] day:  10/01/2014   Chief Complaint: Heather [name] is a 79 y.o. 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  HPI: The patient was last seen in the medical oncology clinic on 06/28/2014.  At that time, she was doing well.  Exam was unremarkable.  CXR revealed post-operative changes on the right with upper lobe scarring.  Chest CT on 09/27/2014 revealed images consistent with right upper lobe wedge resection.  There was slight pleural parenchymal nodular thickening and mild distortion consistent with post operative scarring.    During the interim, she has felt great. She has been back to the gym and lifting weights. She denies any shortness of breath or cough or other systemic symptom.   Past Medical History  Diagnosis Date  . Tachycardia, paroxysmal 03/31/14  . Carotid artery stenosis   . Brain aneurysm   . Colonic polyp   . Hiatal hernia   . GERD (gastroesophageal reflux disease)   . Plantar fasciitis   . Hypertension   . Hypercholesteremia   . Lung nodule 06/21/2014  . Cancer lung    2016    Past Surgical History  Procedure Laterality Date  . Cervix lesion destruction    . Breast lumpectomy      benign  . Tubal ligation    . Breast biopsy Right     neg    Family History  Problem Relation Age of Onset  . Alcohol abuse Mother   . Heart attack Father     Social History:  reports that she quit smoking about 26 years ago. She does not have any smokeless tobacco history on file. She reports that she drinks alcohol. She reports that she does not use illicit drugs.  The patient is alone today.  Allergies:  Allergies  Allergen Reactions  . Contrast Media [Iodinated Diagnostic Agents]     Current Medications: Current Outpatient Prescriptions  Medication Sig Dispense Refill  . atenolol (TENORMIN) 50 MG tablet Take 50 mg by mouth at bedtime.     Marland Kitchen atorvastatin (LIPITOR) 40 MG tablet Take 40 mg by mouth daily at 6 PM.    . cetirizine (ZYRTEC) 10 MG tablet Take 10 mg by mouth daily.    . Cholecalciferol 1000 UNITS tablet Take 1,000 Units by mouth daily.    . Coenzyme Q10 (CO Q 10) 100 MG CAPS Take 1 capsule by mouth daily.    . hydrochlorothiazide (HYDRODIURIL) 25 MG tablet Take 25 mg by mouth daily.    Marland Kitchen lisinopril-hydrochlorothiazide (PRINZIDE,ZESTORETIC) 20-12.5 MG per tablet Take 1 tablet by mouth daily.    Marland Kitchen RESVERATROL PO Take 1 capsule by mouth daily.    Marland Kitchen nystatin (MYCOSTATIN/NYSTOP) 100000 UNIT/GM POWD Apply 1 Bottle topically 3 (three) times daily.    . ranitidine (ZANTAC) 150 MG tablet Take 150 mg by mouth 2 (two) times daily.     No current facility-administered medications for this visit.    Review of Systems:  GENERAL:  Feels great.  Active.  No fevers, sweats or weight loss. PERFORMANCE STATUS (ECOG):  0 HEENT:  No visual changes, runny nose, sore throat, mouth sores or tenderness. Lungs: No shortness of breath or cough.  No hemoptysis. Cardiac:  No chest pain, palpitations, orthopnea, or PND. GI:  No nausea, vomiting, diarrhea, constipation, melena or hematochezia. GU:  No urgency, frequency, dysuria, or hematuria. Musculoskeletal:  No back pain.  No joint pain.  No muscle tenderness. Extremities:  No pain or swelling. Skin:  No rashes or skin changes. Neuro:  No headache, numbness or weakness, balance or coordination issues. Endocrine:  No diabetes, thyroid issues, hot flashes or night sweats. Psych:  No mood changes, depression or anxiety. Pain:  No focal pain. Review of systems:  All other systems reviewed and found to be negative.  Physical Exam: Blood pressure 154/84, pulse 66, temperature 98.2 F (36.8 C), temperature source Oral, height $RemoveBefo'5\' 2"'[id number]$  (1.575 m), weight 147 lb 11.3 oz (67 kg). GENERAL:  Well developed, well nourished, sitting comfortably in the exam room in no acute distress. MENTAL STATUS:   Alert and oriented to person, place and time. HEAD:  Styled auburn hair.  Normocephalic, atraumatic, face symmetric, no Cushingoid features. EYES:  Brown eyes.  Pupils equal round and reactive to light and accomodation.  No conjunctivitis or scleral icterus. ENT:  Oropharynx clear without lesion.  Tongue normal. Mucous membranes moist.  RESPIRATORY:  Clear to auscultation without rales, wheezes or rhonchi. CARDIOVASCULAR:  Regular rate and rhythm without murmur, rub or gallop. ABDOMEN:  Soft, non-tender, with active bowel sounds, and no hepatosplenomegaly.  No masses. SKIN:  No rashes, ulcers or lesions. EXTREMITIES: No edema, no skin discoloration or tenderness.  No palpable cords. LYMPH NODES: No palpable cervical, supraclavicular, axillary or inguinal adenopathy  NEUROLOGICAL: Unremarkable. PSYCH:  Appropriate.  Appointment on 10/01/2014  Component Date Value Ref Range Status  . WBC 10/01/2014 5.4  3.6 - 11.0 K/uL Final  . RBC 10/01/2014 4.23  3.80 - 5.20 MIL/uL Final  . Hemoglobin 10/01/2014 14.7  12.0 - 16.0 g/dL Final  . HCT 10/01/2014 43.1  35.0 - 47.0 % Final  . MCV 10/01/2014 101.9* 80.0 - 100.0 fL Final  . MCH 10/01/2014 34.8* 26.0 - 34.0 pg Final  . MCHC 10/01/2014 34.1  32.0 - 36.0 g/dL Final  . RDW 10/01/2014 13.6  11.5 - 14.5 % Final  . Platelets 10/01/2014 170  150 - 440 K/uL Final  . Neutrophils Relative % 10/01/2014 52   Final  . Neutro Abs 10/01/2014 2.8  1.4 - 6.5 K/uL Final  . Lymphocytes Relative 10/01/2014 35   Final  . Lymphs Abs 10/01/2014 1.9  1.0 - 3.6 K/uL Final  . Monocytes Relative 10/01/2014 8   Final  . Monocytes Absolute 10/01/2014 0.5  0.2 - 0.9 K/uL Final  . Eosinophils Relative 10/01/2014 5   Final  . Eosinophils Absolute 10/01/2014 0.3  0 - 0.7 K/uL Final  . Basophils Relative 10/01/2014 0   Final  . Basophils Absolute 10/01/2014 0.0  0 - 0.1 K/uL Final  . Sodium 10/01/2014 140  135 - 145 mmol/L Final  . Potassium 10/01/2014 3.9  3.5 - 5.1 mmol/L  Final  . Chloride 10/01/2014 103  101 - 111 mmol/L Final  . CO2 10/01/2014 30  22 - 32 mmol/L Final  . Glucose, Bld 10/01/2014 88  65 - 99 mg/dL Final  . BUN 10/01/2014 20  6 - 20 mg/dL Final  . Creatinine, Ser 10/01/2014 0.82  0.44 - 1.00 mg/dL Final  . Calcium 10/01/2014 8.9  8.9 - 10.3 mg/dL Final  . Total Protein 10/01/2014 6.9  6.5 - 8.1 g/dL Final  . Albumin 10/01/2014 4.1  3.5 - 5.0 g/dL Final  . AST 10/01/2014 28  15 - 41 U/L Final  . ALT 10/01/2014 22  14 - 54 U/L Final  . Alkaline Phosphatase 10/01/2014 53  38 - 126  U/L Final  . Total Bilirubin 10/01/2014 0.7  0.3 - 1.2 mg/dL Final  . GFR calc non Af Amer 10/01/2014 >60  >60 mL/min Final  . GFR calc Af Amer 10/01/2014 >60  >60 mL/min Final   Comment: (NOTE) The eGFR has been calculated using the CKD EPI equation. This calculation has not been validated in all clinical situations. eGFR's persistently <60 mL/min signify possible Chronic Kidney Disease.   . Anion gap 10/01/2014 7  5 - 15 Final    Assessment:  Heather [name] is a 79 y.o. female with stage IB and adenocarcinoma of the right upper lobe status post wedge resection on 03/13/2014. Pathology revealed a 1.1 cm high-grade adenocarcinoma with pleural invasion. Nodes were negative. Pathologic stage was T2aN1M0 (stage IB).  PET scan on 02/13/2014 revealed a 1 cm right upper lobe hypermetabolic nodule (SUV 3.8) and a 6 mm right upper lobe nodule (no metabolic activity). There was no mediastianl adenopathy.  CXR on 06/28/2014 revealed post-operative changes on the right with upper lobe scarring.  Chest CT on 09/27/2014 revealed images consistent with right upper lobe wedge resection.  There was slight pleural parenchymal nodular thickening and mild distortion consistent with post operative scarring.  CEA was 3.5 on 06/28/2014 and 4.7 on 10/01/2014.  Symptomatically, she is doing well. Exam is unremarkable.  She has new macrocytic RBC indices.  Plan: 1. Review chest CT-  done. 2. Labs today:  CBC with diff, CMP, CEA. 3. CXR in 3 months. 4. RTC in 3 months for MD assessment, labs (CBC, CMP, CEA, B12, folate, TSH), and review of CXR.   Lequita Asal, MD  10/01/2014, 10:18 AM

## 2014-10-02 LAB — CEA: CEA: 4.7 ng/mL (ref 0.0–4.7)

## 2014-10-06 ENCOUNTER — Encounter: Payer: Self-pay | Admitting: Hematology and Oncology

## 2014-10-06 DIAGNOSIS — D7589 Other specified diseases of blood and blood-forming organs: Secondary | ICD-10-CM

## 2014-10-06 HISTORY — DX: Other specified diseases of blood and blood-forming organs: D75.89

## 2014-12-31 ENCOUNTER — Inpatient Hospital Stay (HOSPITAL_BASED_OUTPATIENT_CLINIC_OR_DEPARTMENT_OTHER): Payer: PPO | Admitting: Hematology and Oncology

## 2014-12-31 ENCOUNTER — Ambulatory Visit
Admission: RE | Admit: 2014-12-31 | Discharge: 2014-12-31 | Disposition: A | Discharge status: Home / Self Care | Payer: PPO | Source: Ambulatory Visit | Attending: Hematology and Oncology | Admitting: Hematology and Oncology

## 2014-12-31 ENCOUNTER — Inpatient Hospital Stay: Payer: PPO | Attending: Hematology and Oncology

## 2014-12-31 ENCOUNTER — Encounter: Payer: Self-pay | Admitting: Hematology and Oncology

## 2014-12-31 VITALS — BP 140/85 | HR 64 | Temp 96.9°F | Resp 18 | Ht 62.0 in | Wt 149.3 lb

## 2014-12-31 DIAGNOSIS — I517 Cardiomegaly: Secondary | ICD-10-CM | POA: Insufficient documentation

## 2014-12-31 DIAGNOSIS — Z86010 Personal history of colonic polyps: Secondary | ICD-10-CM

## 2014-12-31 DIAGNOSIS — I471 Supraventricular tachycardia: Secondary | ICD-10-CM | POA: Insufficient documentation

## 2014-12-31 DIAGNOSIS — R918 Other nonspecific abnormal finding of lung field: Secondary | ICD-10-CM | POA: Diagnosis not present

## 2014-12-31 DIAGNOSIS — Z09 Encounter for follow-up examination after completed treatment for conditions other than malignant neoplasm: Secondary | ICD-10-CM | POA: Diagnosis present

## 2014-12-31 DIAGNOSIS — K219 Gastro-esophageal reflux disease without esophagitis: Secondary | ICD-10-CM | POA: Insufficient documentation

## 2014-12-31 DIAGNOSIS — Z87891 Personal history of nicotine dependence: Secondary | ICD-10-CM | POA: Diagnosis not present

## 2014-12-31 DIAGNOSIS — Z79899 Other long term (current) drug therapy: Secondary | ICD-10-CM

## 2014-12-31 DIAGNOSIS — M722 Plantar fascial fibromatosis: Secondary | ICD-10-CM

## 2014-12-31 DIAGNOSIS — I10 Essential (primary) hypertension: Secondary | ICD-10-CM

## 2014-12-31 DIAGNOSIS — D7589 Other specified diseases of blood and blood-forming organs: Secondary | ICD-10-CM

## 2014-12-31 DIAGNOSIS — I2510 Atherosclerotic heart disease of native coronary artery without angina pectoris: Secondary | ICD-10-CM | POA: Insufficient documentation

## 2014-12-31 DIAGNOSIS — I6529 Occlusion and stenosis of unspecified carotid artery: Secondary | ICD-10-CM

## 2014-12-31 DIAGNOSIS — R911 Solitary pulmonary nodule: Secondary | ICD-10-CM

## 2014-12-31 DIAGNOSIS — E785 Hyperlipidemia, unspecified: Secondary | ICD-10-CM | POA: Diagnosis not present

## 2014-12-31 DIAGNOSIS — K449 Diaphragmatic hernia without obstruction or gangrene: Secondary | ICD-10-CM | POA: Diagnosis not present

## 2014-12-31 DIAGNOSIS — C3411 Malignant neoplasm of upper lobe, right bronchus or lung: Secondary | ICD-10-CM

## 2014-12-31 DIAGNOSIS — Z9889 Other specified postprocedural states: Secondary | ICD-10-CM | POA: Diagnosis not present

## 2014-12-31 LAB — TSH: TSH: 2.535 u[IU]/mL (ref 0.350–4.500)

## 2014-12-31 LAB — CBC WITH DIFFERENTIAL/PLATELET
Basophils Absolute: 0 10*3/uL (ref 0–0.1)
Basophils Relative: 1 %
Eosinophils Absolute: 0.2 10*3/uL (ref 0–0.7)
Eosinophils Relative: 4 %
HCT: 45.1 % (ref 35.0–47.0)
Hemoglobin: 15.2 g/dL (ref 12.0–16.0)
Lymphocytes Relative: 34 %
Lymphs Abs: 1.7 10*3/uL (ref 1.0–3.6)
MCH: 33.9 pg (ref 26.0–34.0)
MCHC: 33.7 g/dL (ref 32.0–36.0)
MCV: 100.7 fL — ABNORMAL HIGH (ref 80.0–100.0)
Monocytes Absolute: 0.4 10*3/uL (ref 0.2–0.9)
Monocytes Relative: 9 %
Neutro Abs: 2.7 10*3/uL (ref 1.4–6.5)
Neutrophils Relative %: 52 %
Platelets: 147 10*3/uL — ABNORMAL LOW (ref 150–440)
RBC: 4.48 MIL/uL (ref 3.80–5.20)
RDW: 12.6 % (ref 11.5–14.5)
WBC: 5 10*3/uL (ref 3.6–11.0)

## 2014-12-31 LAB — COMPREHENSIVE METABOLIC PANEL
ALT: 25 U/L (ref 14–54)
AST: 29 U/L (ref 15–41)
Albumin: 4.5 g/dL (ref 3.5–5.0)
Alkaline Phosphatase: 59 U/L (ref 38–126)
Anion gap: 7 (ref 5–15)
BUN: 19 mg/dL (ref 6–20)
CO2: 28 mmol/L (ref 22–32)
Calcium: 9.3 mg/dL (ref 8.9–10.3)
Chloride: 102 mmol/L (ref 101–111)
Creatinine, Ser: 0.79 mg/dL (ref 0.44–1.00)
GFR calc Af Amer: >60 mL/min (ref >60)
GFR calc non Af Amer: >60 mL/min (ref >60)
Glucose, Bld: 109 mg/dL — ABNORMAL HIGH (ref 65–99)
Potassium: 4.1 mmol/L (ref 3.5–5.1)
Sodium: 137 mmol/L (ref 135–145)
Total Bilirubin: 0.7 mg/dL (ref 0.3–1.2)
Total Protein: 7.1 g/dL (ref 6.5–8.1)

## 2014-12-31 LAB — VITAMIN B12: Vitamin B-12: 593 pg/mL (ref 180–914)

## 2014-12-31 LAB — FOLATE: Folate: 17.1 ng/mL (ref >5.9)

## 2014-12-31 NOTE — Progress Notes (Signed)
[facility] day:  12/31/2014   Chief Complaint: Heather [name] is a 79 y.o. 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  HPI: The patient was last seen in the medical oncology clinic on 10/01/2014.  At that time, she was doing well. Exam was unremarkable.  Chest CT on 09/27/2014 had revealed slight pleural parenchymal nodular thickening and mild distortion consistent with post operative scarring.  Labs were notable for new macrocytic RBC indices.  CXR on 12/31/2014 revealed a stable right apical postsurgical change most consistent with scarring.  During the interim, she has done well.  She denies any concerns.  Past Medical History  Diagnosis Date  . Tachycardia, paroxysmal 03/31/14  . Carotid artery stenosis   . Brain aneurysm   . Colonic polyp   . Hiatal hernia   . GERD (gastroesophageal reflux disease)   . Plantar fasciitis   . Hypertension   . Hypercholesteremia   . Lung nodule 06/21/2014  . Cancer lung    2016    Past Surgical History  Procedure Laterality Date  . Cervix lesion destruction    . Breast lumpectomy      benign  . Tubal ligation    . Breast biopsy Right     neg    Family History  Problem Relation Age of Onset  . Alcohol abuse Mother   . Heart attack Father     Social History:  reports that she quit smoking about 26 years ago. She does not have any smokeless tobacco history on file. She reports that she drinks alcohol. She reports that she does not use illicit drugs.  The patient is alone today.  Allergies:  Allergies  Allergen Reactions  . Contrast Media [Iodinated Diagnostic Agents]     Current Medications: Current Outpatient Prescriptions  Medication Sig Dispense Refill  . atenolol (TENORMIN) 50 MG tablet Take 50 mg by mouth at bedtime.    . cetirizine (ZYRTEC) 10 MG tablet Take 10 mg by mouth daily.    . Cholecalciferol  1000 UNITS tablet Take 1,000 Units by mouth daily.    . Coenzyme Q10 (CO Q 10) 100 MG CAPS Take 1 capsule by mouth daily.    . hydrochlorothiazide (HYDRODIURIL) 25 MG tablet Take 25 mg by mouth daily.    Marland Kitchen lisinopril-hydrochlorothiazide (PRINZIDE,ZESTORETIC) 20-12.5 MG per tablet Take 1 tablet by mouth daily.    . ranitidine (ZANTAC) 150 MG tablet Take 150 mg by mouth 2 (two) times daily.    Marland Kitchen RESVERATROL PO Take 1 capsule by mouth daily.    . simvastatin (ZOCOR) 20 MG tablet Take 20 mg by mouth daily.    Marland Kitchen nystatin (MYCOSTATIN/NYSTOP) 100000 UNIT/GM POWD Apply 1 Bottle topically 3 (three) times daily.     No current facility-administered medications for this visit.    Review of Systems:  GENERAL:  Feels good.  Active.  No fevers, sweats or weight loss. PERFORMANCE STATUS (ECOG):  0 HEENT:  No visual changes, runny nose, sore throat, mouth sores or tenderness. Lungs: No shortness of breath or cough.  No hemoptysis. Cardiac:  No chest pain, palpitations, orthopnea, or PND. GI:  No nausea, vomiting, diarrhea, constipation, melena or hematochezia. GU:  No urgency, frequency, dysuria, or hematuria. Musculoskeletal:  No back pain.  No joint pain.  No muscle tenderness. Extremities:  No pain or swelling. Skin:  No rashes or skin changes. Neuro:  No headache, numbness or weakness, balance or coordination issues. Endocrine:  No diabetes, thyroid issues, hot flashes or night sweats. Psych:  No mood changes, depression or anxiety. Pain:  No focal pain. Review of systems:  All other systems reviewed and found to be negative.  Physical Exam: Blood pressure 140/85, pulse 64, temperature 96.9 F (36.1 C), temperature source Tympanic, resp. rate 18, height _0  (1.575 m), weight 149 lb 4 oz (67.7 kg). GENERAL:  Well developed, well nourished, sitting comfortably in the exam room in no acute distress. MENTAL STATUS:  Alert and oriented to person, place and time. HEAD:  Styled auburn hair.   Normocephalic, atraumatic, face symmetric, no Cushingoid features. EYES:  Brown eyes.  Pupils equal round and reactive to light and accomodation.  No conjunctivitis or scleral icterus. ENT:  Oropharynx clear without lesion.  Tongue normal. Mucous membranes moist.  RESPIRATORY:  Clear to auscultation without rales, wheezes or rhonchi. CARDIOVASCULAR:  Regular rate and rhythm without murmur, rub or gallop. ABDOMEN:  Soft, non-tender, with active bowel sounds, and no hepatosplenomegaly.  No masses. SKIN:  No rashes, ulcers or lesions. EXTREMITIES: No edema, no skin discoloration or tenderness.  No palpable cords. LYMPH NODES: No palpable cervical, supraclavicular, axillary or inguinal adenopathy  NEUROLOGICAL: Unremarkable. PSYCH:  Appropriate.  Appointment on 12/31/2014  Component Date Value Ref Range Status  . WBC 12/31/2014 5.0  3.6 - 11.0 K/uL Final  . RBC 12/31/2014 4.48  3.80 - 5.20 MIL/uL Final  . Hemoglobin 12/31/2014 15.2  12.0 - 16.0 g/dL Final  . HCT 12/31/2014 45.1  35.0 - 47.0 % Final  . MCV 12/31/2014 100.7* 80.0 - 100.0 fL Final  . MCH 12/31/2014 33.9  26.0 - 34.0 pg Final  . MCHC 12/31/2014 33.7  32.0 - 36.0 g/dL Final  . RDW 12/31/2014 12.6  11.5 - 14.5 % Final  . Platelets 12/31/2014 147* 150 - 440 K/uL Final  . Neutrophils Relative % 12/31/2014 52   Final  . Neutro Abs 12/31/2014 2.7  1.4 - 6.5 K/uL Final  . Lymphocytes Relative 12/31/2014 34   Final  . Lymphs Abs 12/31/2014 1.7  1.0 - 3.6 K/uL Final  . Monocytes Relative 12/31/2014 9   Final  . Monocytes Absolute 12/31/2014 0.4  0.2 - 0.9 K/uL Final  . Eosinophils Relative 12/31/2014 4   Final  . Eosinophils Absolute 12/31/2014 0.2  0 - 0.7 K/uL Final  . Basophils Relative 12/31/2014 1   Final  . Basophils Absolute 12/31/2014 0.0  0 - 0.1 K/uL Final  . Sodium 12/31/2014 137  135 - 145 mmol/L Final  . Potassium 12/31/2014 4.1  3.5 - 5.1 mmol/L Final  . Chloride 12/31/2014 102  101 - 111 mmol/L Final  . CO2  12/31/2014 28  22 - 32 mmol/L Final  . Glucose, Bld 12/31/2014 109* 65 - 99 mg/dL Final  . BUN 12/31/2014 19  6 - 20 mg/dL Final  . Creatinine, Ser 12/31/2014 0.79  0.44 - 1.00 mg/dL Final  . Calcium 12/31/2014 9.3  8.9 - 10.3 mg/dL Final  . Total Protein 12/31/2014 7.1  6.5 - 8.1 g/dL Final  . Albumin 12/31/2014 4.5  3.5 - 5.0 g/dL Final  . AST 12/31/2014 29  15 - 41 U/L Final  . ALT 12/31/2014 25  14 - 54 U/L Final  . Alkaline Phosphatase 12/31/2014 59  38 - 126 U/L Final  . Total Bilirubin 12/31/2014 0.7  0.3 - 1.2 mg/dL Final  . GFR calc non Af  Amer 12/31/2014 >60  >60 mL/min Final  . GFR calc Af Amer 12/31/2014 >60  >60 mL/min Final   Comment: (NOTE) The eGFR has been calculated using the CKD EPI equation. This calculation has not been validated in all clinical situations. eGFR's persistently <60 mL/min signify possible Chronic Kidney Disease.   . Anion gap 12/31/2014 7  5 - 15 Final    Assessment:  Heather [name] is a 79 y.o. female with stage IB and adenocarcinoma of the right upper lobe status post wedge resection on 03/13/2014. Pathology revealed a 1.1 cm high-grade adenocarcinoma with pleural invasion. Nodes were negative. Pathologic stage was T2aN1M0 (stage IB).  PET scan on 02/13/2014 revealed a 1 cm right upper lobe hypermetabolic nodule (SUV 3.8) and a 6 mm right upper lobe nodule (no metabolic activity). There was no mediastianl adenopathy.  CXR on 06/28/2014 revealed post-operative changes on the right with upper lobe scarring.  Chest CT on 09/27/2014 revealed images consistent with right upper lobe wedge resection.  There was slight pleural parenchymal nodular thickening and mild distortion consistent with post operative scarring.  CXR on 12/31/2014 revealed a stable right apical postsurgical change most consistent with scarring.  CEA was 3.5 on 06/28/2014 and 4.7 on 10/01/2014.  Symptomatically, she is doing well. Exam is unremarkable.  She has persistent macrocytic RBC  indices.  Plan: 1.  Review CXR- done. 2.  Labs today:  CBC with diff, CMP, CEA, B12, folate, TSH. 3.  Schedule chest CT without contrast in 3 months- follow-up lung cancer. 4.  RTC in 3 months for MD assessment, labs (CBC, CMP), and review of chest CT.   Lequita Asal, MD  12/31/2014, 9:32 AM

## 2014-12-31 NOTE — Progress Notes (Signed)
Patient is here for follow-up of lung cancer. She states that she has been doing well and has no complaints this morning.

## 2015-01-01 LAB — CEA: CEA: 4.3 ng/mL (ref 0.0–4.7)

## 2015-01-02 ENCOUNTER — Other Ambulatory Visit: Payer: Self-pay

## 2015-01-02 DIAGNOSIS — C3411 Malignant neoplasm of upper lobe, right bronchus or lung: Secondary | ICD-10-CM

## 2015-02-27 DIAGNOSIS — Z86010 Personal history of colonic polyps: Secondary | ICD-10-CM | POA: Diagnosis not present

## 2015-03-31 ENCOUNTER — Ambulatory Visit: Payer: PPO | Admitting: Certified Registered Nurse Anesthetist

## 2015-03-31 ENCOUNTER — Encounter
Admission: RE | Disposition: A | Discharge status: Home / Self Care | Payer: Self-pay | Source: Ambulatory Visit | Attending: Unknown Physician Specialty

## 2015-03-31 ENCOUNTER — Encounter: Payer: Self-pay | Admitting: Anesthesiology

## 2015-03-31 ENCOUNTER — Ambulatory Visit
Admission: RE | Admit: 2015-03-31 | Discharge: 2015-03-31 | Disposition: A | Discharge status: Home / Self Care | Payer: PPO | Source: Ambulatory Visit | Attending: Unknown Physician Specialty | Admitting: Unknown Physician Specialty

## 2015-03-31 DIAGNOSIS — Z91041 Radiographic dye allergy status: Secondary | ICD-10-CM | POA: Insufficient documentation

## 2015-03-31 DIAGNOSIS — Z8249 Family history of ischemic heart disease and other diseases of the circulatory system: Secondary | ICD-10-CM | POA: Insufficient documentation

## 2015-03-31 DIAGNOSIS — E785 Hyperlipidemia, unspecified: Secondary | ICD-10-CM | POA: Insufficient documentation

## 2015-03-31 DIAGNOSIS — K621 Rectal polyp: Secondary | ICD-10-CM | POA: Diagnosis not present

## 2015-03-31 DIAGNOSIS — K635 Polyp of colon: Secondary | ICD-10-CM | POA: Diagnosis not present

## 2015-03-31 DIAGNOSIS — Z87891 Personal history of nicotine dependence: Secondary | ICD-10-CM | POA: Insufficient documentation

## 2015-03-31 DIAGNOSIS — Z1211 Encounter for screening for malignant neoplasm of colon: Secondary | ICD-10-CM | POA: Diagnosis not present

## 2015-03-31 DIAGNOSIS — I471 Supraventricular tachycardia: Secondary | ICD-10-CM | POA: Diagnosis not present

## 2015-03-31 DIAGNOSIS — C3411 Malignant neoplasm of upper lobe, right bronchus or lung: Secondary | ICD-10-CM | POA: Diagnosis not present

## 2015-03-31 DIAGNOSIS — K640 First degree hemorrhoids: Secondary | ICD-10-CM | POA: Insufficient documentation

## 2015-03-31 DIAGNOSIS — Z86010 Personal history of colonic polyps: Secondary | ICD-10-CM | POA: Diagnosis not present

## 2015-03-31 DIAGNOSIS — K219 Gastro-esophageal reflux disease without esophagitis: Secondary | ICD-10-CM | POA: Diagnosis not present

## 2015-03-31 DIAGNOSIS — K6389 Other specified diseases of intestine: Secondary | ICD-10-CM | POA: Diagnosis not present

## 2015-03-31 DIAGNOSIS — I6529 Occlusion and stenosis of unspecified carotid artery: Secondary | ICD-10-CM | POA: Diagnosis not present

## 2015-03-31 DIAGNOSIS — Z8489 Family history of other specified conditions: Secondary | ICD-10-CM | POA: Insufficient documentation

## 2015-03-31 DIAGNOSIS — K5730 Diverticulosis of large intestine without perforation or abscess without bleeding: Secondary | ICD-10-CM | POA: Insufficient documentation

## 2015-03-31 DIAGNOSIS — Z811 Family history of alcohol abuse and dependence: Secondary | ICD-10-CM | POA: Diagnosis not present

## 2015-03-31 DIAGNOSIS — K648 Other hemorrhoids: Secondary | ICD-10-CM | POA: Diagnosis not present

## 2015-03-31 DIAGNOSIS — D124 Benign neoplasm of descending colon: Secondary | ICD-10-CM | POA: Insufficient documentation

## 2015-03-31 DIAGNOSIS — D123 Benign neoplasm of transverse colon: Secondary | ICD-10-CM | POA: Insufficient documentation

## 2015-03-31 DIAGNOSIS — K449 Diaphragmatic hernia without obstruction or gangrene: Secondary | ICD-10-CM | POA: Diagnosis not present

## 2015-03-31 DIAGNOSIS — D128 Benign neoplasm of rectum: Secondary | ICD-10-CM | POA: Diagnosis not present

## 2015-03-31 DIAGNOSIS — Z79899 Other long term (current) drug therapy: Secondary | ICD-10-CM | POA: Diagnosis not present

## 2015-03-31 DIAGNOSIS — K5790 Diverticulosis of intestine, part unspecified, without perforation or abscess without bleeding: Secondary | ICD-10-CM | POA: Diagnosis not present

## 2015-03-31 DIAGNOSIS — Z8679 Personal history of other diseases of the circulatory system: Secondary | ICD-10-CM | POA: Diagnosis not present

## 2015-03-31 HISTORY — PX: COLONOSCOPY WITH PROPOFOL: SHX5780

## 2015-03-31 SURGERY — COLONOSCOPY WITH PROPOFOL
Anesthesia: General

## 2015-03-31 MED ORDER — PROPOFOL 500 MG/50ML IV EMUL
INTRAVENOUS | Status: DC | PRN
  Administered 2015-03-31: 100 ug/kg/min via INTRAVENOUS

## 2015-03-31 MED ORDER — FENTANYL CITRATE (PF) 100 MCG/2ML IJ SOLN
INTRAMUSCULAR | Status: DC | PRN
  Administered 2015-03-31: 50 ug via INTRAVENOUS

## 2015-03-31 MED ORDER — SODIUM CHLORIDE 0.9 % IV SOLN
INTRAVENOUS | Status: DC
Start: 2015-03-31 — End: 2015-03-31
  Administered 2015-03-31: 1000 mL via INTRAVENOUS

## 2015-03-31 MED ORDER — PHENYLEPHRINE HCL 10 MG/ML IJ SOLN
INTRAMUSCULAR | Status: DC | PRN
  Administered 2015-03-31: 200 ug via INTRAVENOUS
  Administered 2015-03-31: 100 ug via INTRAVENOUS
  Administered 2015-03-31: 200 ug via INTRAVENOUS

## 2015-03-31 MED ORDER — PROPOFOL 10 MG/ML IV BOLUS
INTRAVENOUS | Status: DC | PRN
  Administered 2015-03-31: 70 mg via INTRAVENOUS

## 2015-03-31 NOTE — Anesthesia Postprocedure Evaluation (Signed)
Anesthesia Post Note  Patient: Heather [name]  Procedure(s) Performed: Procedure(s) (LRB): COLONOSCOPY WITH PROPOFOL (N/A)  Patient location during evaluation: Endoscopy Anesthesia Type: General Level of consciousness: awake and alert Pain management: pain level controlled Vital Signs Assessment: post-procedure vital signs reviewed and stable Respiratory status: spontaneous breathing, nonlabored ventilation, respiratory function stable and patient connected to nasal cannula oxygen Cardiovascular status: blood pressure returned to baseline and stable Postop Assessment: no signs of nausea or vomiting Anesthetic complications: no    Last Vitals:  Filed Vitals:   03/31/15 1100 03/31/15 1110  BP: 97/50 128/59  Pulse: 68 68  Temp:    Resp: 15 16    Last Pain: There were no vitals filed for this visit.               Miguel Medal S

## 2015-03-31 NOTE — Op Note (Signed)
[facility] Gastroenterology Patient Name: Heather [name] Procedure Date: 03/31/2015 9:54 AM MRN: [id number] Account #: [id number] Date of Birth: 11-Feb-1935 Admit Type: Outpatient Age: 80 Room: [facility] ENDO ROOM 1 Gender: Female Note Status: Finalized Procedure:            Colonoscopy Indications:          High risk colon cancer surveillance: Personal history                        of colonic polyps Providers:            Manya Silvas, MD Referring MD:         Rusty Aus, MD (Referring MD) Medicines:            Propofol per Anesthesia Complications:        No immediate complications. Procedure:            Pre-Anesthesia Assessment:                       - After reviewing the risks and benefits, the patient                        was deemed in satisfactory condition to undergo the                        procedure.                       After obtaining informed consent, the colonoscope was                        passed under direct vision. Throughout the procedure,                        the patient's blood pressure, pulse, and oxygen                        saturations were monitored continuously. The                        Colonoscope was introduced through the anus and                        advanced to the the cecum, identified by appendiceal                        orifice and ileocecal valve. The colonoscopy was                        performed without difficulty. The patient tolerated the                        procedure well. The quality of the bowel preparation                        was excellent. Findings:      Five sessile polyps were found in the rectum, descending colon and       transverse colon. The polyps were diminutive in size. These polyps were       removed with a jumbo cold forceps. Resection and retrieval were complete.  Multiple small-mouthed diverticula were found in the sigmoid colon and       descending colon.      Internal  hemorrhoids were found during endoscopy. The hemorrhoids were       small and Grade I (internal hemorrhoids that do not prolapse).      Mild melanosis coli in left colon.      The exam was otherwise without abnormality. Impression:           - Five diminutive polyps in the rectum, in the                        descending colon and in the transverse colon, removed                        with a jumbo cold forceps. Resected and retrieved.                       - Diverticulosis in the sigmoid colon and in the                        descending colon.                       - Internal hemorrhoids.                       - The examination was otherwise normal. Recommendation:       - Await pathology results. Manya Silvas, MD 03/31/2015 10:47:30 AM This report has been signed electronically. Number of Addenda: 0 Note Initiated On: 03/31/2015 9:54 AM Scope Withdrawal Time: 0 hours 16 minutes 49 seconds  Total Procedure Duration: 0 hours 20 minutes 21 seconds       [facility]

## 2015-03-31 NOTE — Transfer of Care (Signed)
Immediate Anesthesia Transfer of Care Note  Patient: Heather [name]  Procedure(s) Performed: Procedure(s): COLONOSCOPY WITH PROPOFOL (N/A)  Patient Location: PACU  Anesthesia Type:General  Level of Consciousness: awake, alert , oriented and patient cooperative  Airway & Oxygen Therapy: Patient Spontanous Breathing and Patient connected to nasal cannula oxygen  Post-op Assessment: Report given to RN and Post -op Vital signs reviewed and stable  Post vital signs: Reviewed and stable  Last Vitals:  Filed Vitals:   03/31/15 1050 03/31/15 1052  BP: 97/46 97/46  Pulse: 77 79  Temp: 36 C 36 C  Resp: 16 18    Complications: No apparent anesthesia complications

## 2015-03-31 NOTE — Anesthesia Preprocedure Evaluation (Signed)
Anesthesia Evaluation  Patient identified by MRN, date of birth, ID band Patient awake    Reviewed: Allergy & Precautions, NPO status , Patient's Chart, lab work & pertinent test results, reviewed documented beta blocker date and time   Airway Mallampati: II  TM Distance: >3 FB     Dental  (+) Chipped   Pulmonary former smoker,           Cardiovascular hypertension, Pt. on medications and Pt. on home beta blockers      Neuro/Psych  Neuromuscular disease    GI/Hepatic hiatal hernia, GERD  ,  Endo/Other    Renal/GU      Musculoskeletal   Abdominal   Peds  Hematology   Anesthesia Other Findings   Reproductive/Obstetrics                             Anesthesia Physical Anesthesia Plan  ASA: III  Anesthesia Plan: General   Post-op Pain Management:    Induction: Intravenous  Airway Management Planned: Nasal Cannula  Additional Equipment:   Intra-op Plan:   Post-operative Plan:   Informed Consent: I have reviewed the patients History and Physical, chart, labs and discussed the procedure including the risks, benefits and alternatives for the proposed anesthesia with the patient or authorized representative who has indicated his/her understanding and acceptance.     Plan Discussed with: CRNA  Anesthesia Plan Comments:         Anesthesia Quick Evaluation

## 2015-04-01 LAB — SURGICAL PATHOLOGY

## 2015-04-05 DIAGNOSIS — R69 Illness, unspecified: Secondary | ICD-10-CM | POA: Diagnosis not present

## 2015-04-05 DIAGNOSIS — J069 Acute upper respiratory infection, unspecified: Secondary | ICD-10-CM | POA: Diagnosis not present

## 2015-04-07 DIAGNOSIS — J159 Unspecified bacterial pneumonia: Secondary | ICD-10-CM | POA: Diagnosis not present

## 2015-04-07 DIAGNOSIS — I950 Idiopathic hypotension: Secondary | ICD-10-CM | POA: Diagnosis not present

## 2015-04-08 ENCOUNTER — Ambulatory Visit: Payer: PPO

## 2015-04-10 ENCOUNTER — Other Ambulatory Visit: Payer: PPO

## 2015-04-10 ENCOUNTER — Ambulatory Visit: Payer: PPO | Admitting: Hematology and Oncology

## 2015-04-11 MED ORDER — SODIUM CHLORIDE 0.9 % IV SOLN: INTRAVENOUS | Status: DC

## 2015-04-11 NOTE — H&P (Signed)
Primary Care Physician:  Rusty Aus, MD Primary Gastroenterologist:  Dr. Vira Agar  Pre-Procedure History & Physical: HPI:  Heather [name] is a 80 y.o. female is here for an colonoscopy.   Past Medical History  Diagnosis Date  . Tachycardia, paroxysmal ([place]) 03/31/14  . Carotid artery stenosis   . Brain aneurysm   . Colonic polyp   . Hiatal hernia   . GERD (gastroesophageal reflux disease)   . Plantar fasciitis   . Hypertension   . Hypercholesteremia   . Lung nodule 06/21/2014  . Cancer [facility]) lung    2016    Past Surgical History  Procedure Laterality Date  . Cervix lesion destruction    . Breast lumpectomy      benign  . Tubal ligation    . Breast biopsy Right     neg  . Colonoscopy with propofol N/A 03/31/2015    Procedure: COLONOSCOPY WITH PROPOFOL;  Surgeon: Manya Silvas, MD;  Location: [facility] ENDOSCOPY;  Service: Endoscopy;  Laterality: N/A;    Prior to Admission medications   Medication Sig Start Date End Date Taking? Authorizing Provider  atenolol (TENORMIN) 50 MG tablet Take 50 mg by mouth at bedtime.   Yes Historical Provider, MD  cetirizine (ZYRTEC) 10 MG tablet Take 10 mg by mouth daily.   Yes Historical Provider, MD  hydrochlorothiazide (HYDRODIURIL) 25 MG tablet Take 25 mg by mouth daily.   Yes Historical Provider, MD  lisinopril-hydrochlorothiazide (PRINZIDE,ZESTORETIC) 20-12.5 MG per tablet Take 1 tablet by mouth daily.   Yes Historical Provider, MD  Cholecalciferol 1000 UNITS tablet Take 1,000 Units by mouth daily.    Historical Provider, MD  Coenzyme Q10 (CO Q 10) 100 MG CAPS Take 1 capsule by mouth daily.    Historical Provider, MD  nystatin (MYCOSTATIN/NYSTOP) 100000 UNIT/GM POWD Apply 1 Bottle topically 3 (three) times daily.    Historical Provider, MD  ranitidine (ZANTAC) 150 MG tablet Take 150 mg by mouth 2 (two) times daily.    Historical Provider, MD  RESVERATROL PO Take 1 capsule by mouth daily.    Historical Provider, MD  simvastatin (ZOCOR) 20  MG tablet Take 20 mg by mouth daily.    Historical Provider, MD    Allergies as of 03/25/2015 - Review Complete 12/31/2014  Allergen Reaction Noted  . Contrast media [iodinated diagnostic agents]  06/11/2014    Family History  Problem Relation Age of Onset  . Alcohol abuse Mother   . Heart attack Father     Social History   Social History  . Marital Status: Married    Spouse Name: N/A  . Number of Children: N/A  . Years of Education: N/A   Occupational History  . Not on file.   Social History Main Topics  . Smoking status: Former Smoker -- 60 years    Quit date: 06/10/1988  . Smokeless tobacco: Not on file  . Alcohol Use: 0.0 oz/week    0 Standard drinks or equivalent per week     Comment: occasional glass of wine  . Drug Use: No  . Sexual Activity: Not on file   Other Topics Concern  . Not on file   Social History Narrative    Review of Systems: See HPI, otherwise negative ROS  Physical Exam: BP 128/59 mmHg  Pulse 68  Temp(Src) 96.8 F (36 C) (Tympanic)  Resp 16  Ht '5\' 1"'$  (1.549 m)  Wt 63.957 kg (141 lb)  BMI 26.66 kg/m2  SpO2 98% General:  Alert,  pleasant and cooperative in NAD Head:  Normocephalic and atraumatic. Neck:  Supple; no masses or thyromegaly. Lungs:  Clear throughout to auscultation.    Heart:  Regular rate and rhythm. Abdomen:  Soft, nontender and nondistended. Normal bowel sounds, without guarding, and without rebound.   Neurologic:  Alert and  oriented x4;  grossly normal neurologically.  Impression/Plan: Heather [name] is here for an colonoscopy to be performed for [facility] colon polyps  Risks, benefits, limitations, and alternatives regarding  colonoscopy have been reviewed with the patient.  Questions have been answered.  All parties agreeable.   Gaylyn Cheers, MD  04/11/2015, 4:35 PM

## 2015-04-14 DIAGNOSIS — R509 Fever, unspecified: Secondary | ICD-10-CM | POA: Diagnosis not present

## 2015-04-14 DIAGNOSIS — J159 Unspecified bacterial pneumonia: Secondary | ICD-10-CM | POA: Diagnosis not present

## 2015-04-14 DIAGNOSIS — R05 Cough: Secondary | ICD-10-CM | POA: Diagnosis not present

## 2015-04-28 ENCOUNTER — Ambulatory Visit
Admission: RE | Admit: 2015-04-28 | Discharge: 2015-04-28 | Disposition: A | Discharge status: Home / Self Care | Payer: PPO | Source: Ambulatory Visit | Attending: Hematology and Oncology | Admitting: Hematology and Oncology

## 2015-04-28 DIAGNOSIS — C3411 Malignant neoplasm of upper lobe, right bronchus or lung: Secondary | ICD-10-CM | POA: Diagnosis not present

## 2015-04-28 DIAGNOSIS — J432 Centrilobular emphysema: Secondary | ICD-10-CM | POA: Diagnosis not present

## 2015-04-28 DIAGNOSIS — J439 Emphysema, unspecified: Secondary | ICD-10-CM | POA: Diagnosis not present

## 2015-04-28 DIAGNOSIS — J984 Other disorders of lung: Secondary | ICD-10-CM | POA: Diagnosis not present

## 2015-04-28 DIAGNOSIS — R918 Other nonspecific abnormal finding of lung field: Secondary | ICD-10-CM | POA: Diagnosis not present

## 2015-04-28 DIAGNOSIS — N1330 Unspecified hydronephrosis: Secondary | ICD-10-CM | POA: Insufficient documentation

## 2015-04-28 DIAGNOSIS — C3491 Malignant neoplasm of unspecified part of right bronchus or lung: Secondary | ICD-10-CM | POA: Diagnosis not present

## 2015-04-28 DIAGNOSIS — R911 Solitary pulmonary nodule: Secondary | ICD-10-CM | POA: Diagnosis not present

## 2015-05-01 ENCOUNTER — Inpatient Hospital Stay (HOSPITAL_BASED_OUTPATIENT_CLINIC_OR_DEPARTMENT_OTHER): Payer: PPO | Admitting: Hematology and Oncology

## 2015-05-01 ENCOUNTER — Inpatient Hospital Stay: Payer: PPO | Attending: Hematology and Oncology

## 2015-05-01 VITALS — BP 127/75 | HR 60 | Temp 97.6°F | Resp 20 | Wt 145.1 lb

## 2015-05-01 DIAGNOSIS — J40 Bronchitis, not specified as acute or chronic: Secondary | ICD-10-CM | POA: Diagnosis not present

## 2015-05-01 DIAGNOSIS — R000 Tachycardia, unspecified: Secondary | ICD-10-CM

## 2015-05-01 DIAGNOSIS — Z87891 Personal history of nicotine dependence: Secondary | ICD-10-CM | POA: Insufficient documentation

## 2015-05-01 DIAGNOSIS — Z86010 Personal history of colonic polyps: Secondary | ICD-10-CM | POA: Insufficient documentation

## 2015-05-01 DIAGNOSIS — J432 Centrilobular emphysema: Secondary | ICD-10-CM | POA: Insufficient documentation

## 2015-05-01 DIAGNOSIS — E7800 Pure hypercholesterolemia, unspecified: Secondary | ICD-10-CM | POA: Diagnosis not present

## 2015-05-01 DIAGNOSIS — C3411 Malignant neoplasm of upper lobe, right bronchus or lung: Secondary | ICD-10-CM | POA: Diagnosis not present

## 2015-05-01 DIAGNOSIS — N1330 Unspecified hydronephrosis: Secondary | ICD-10-CM | POA: Diagnosis not present

## 2015-05-01 DIAGNOSIS — K219 Gastro-esophageal reflux disease without esophagitis: Secondary | ICD-10-CM | POA: Diagnosis not present

## 2015-05-01 DIAGNOSIS — I6529 Occlusion and stenosis of unspecified carotid artery: Secondary | ICD-10-CM | POA: Insufficient documentation

## 2015-05-01 DIAGNOSIS — K5730 Diverticulosis of large intestine without perforation or abscess without bleeding: Secondary | ICD-10-CM

## 2015-05-01 DIAGNOSIS — K449 Diaphragmatic hernia without obstruction or gangrene: Secondary | ICD-10-CM | POA: Insufficient documentation

## 2015-05-01 DIAGNOSIS — I10 Essential (primary) hypertension: Secondary | ICD-10-CM

## 2015-05-01 LAB — COMPREHENSIVE METABOLIC PANEL
ALT: 22 U/L (ref 14–54)
AST: 22 U/L (ref 15–41)
Albumin: 4.6 g/dL (ref 3.5–5.0)
Alkaline Phosphatase: 58 U/L (ref 38–126)
Anion gap: 6 (ref 5–15)
BUN: 32 mg/dL — ABNORMAL HIGH (ref 6–20)
CO2: 28 mmol/L (ref 22–32)
Calcium: 9.3 mg/dL (ref 8.9–10.3)
Chloride: 102 mmol/L (ref 101–111)
Creatinine, Ser: 0.79 mg/dL (ref 0.44–1.00)
GFR calc Af Amer: >60 mL/min (ref >60)
GFR calc non Af Amer: >60 mL/min (ref >60)
Glucose, Bld: 104 mg/dL — ABNORMAL HIGH (ref 65–99)
Potassium: 4.3 mmol/L (ref 3.5–5.1)
Sodium: 136 mmol/L (ref 135–145)
Total Bilirubin: 1.1 mg/dL (ref 0.3–1.2)
Total Protein: 7.4 g/dL (ref 6.5–8.1)

## 2015-05-01 LAB — CBC WITH DIFFERENTIAL/PLATELET
Basophils Absolute: 0 10*3/uL (ref 0–0.1)
Basophils Relative: 1 %
Eosinophils Absolute: 0.2 10*3/uL (ref 0–0.7)
Eosinophils Relative: 4 %
HCT: 45.3 % (ref 35.0–47.0)
Hemoglobin: 15.5 g/dL (ref 12.0–16.0)
Lymphocytes Relative: 35 %
Lymphs Abs: 2.1 10*3/uL (ref 1.0–3.6)
MCH: 34.2 pg — ABNORMAL HIGH (ref 26.0–34.0)
MCHC: 34.3 g/dL (ref 32.0–36.0)
MCV: 99.6 fL (ref 80.0–100.0)
Monocytes Absolute: 0.6 10*3/uL (ref 0.2–0.9)
Monocytes Relative: 9 %
Neutro Abs: 3.1 10*3/uL (ref 1.4–6.5)
Neutrophils Relative %: 51 %
Platelets: 167 10*3/uL (ref 150–440)
RBC: 4.54 MIL/uL (ref 3.80–5.20)
RDW: 13.6 % (ref 11.5–14.5)
WBC: 6.1 10*3/uL (ref 3.6–11.0)

## 2015-05-01 NOTE — Progress Notes (Signed)
Hx R Lung Ca, Pt is here today for CT results. In March she had a bad bout of Bacterial bronchitis. Has a lingering cough. Productive sputum is thick and clear in color. No dyspnea noted today.Prior to bronchitis she had zero respiratory issues.Was going to the gym 3 days a week for exercise. Appetite is normal. Denies any pain anywhere. B/B WNL's.

## 2015-05-01 NOTE — Progress Notes (Signed)
[place] [facility] day:  05/01/2015   Chief Complaint: Heather [name] is a 80 y.o. 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  HPI: The patient was last seen in the medical oncology clinic on 12/31/2014.  At that time, she was doing well.  She denied any concerns.  Exam was unremarkable.  Labs included a hematocrit of 45.1, hemoglobin 100.7, and platelets 147,000.  CMP and CEA (4.3) were normal.  B12, folate, and TSH were normal.  During the interim, she underwent colonoscopy by Dr. Vira Agar on 03/31/2015.  There were five diminutive polyps in the rectum, in the descending colon and in the transverse colon, removed with cold forceps.  There was diverticulosis in the sigmoid colon and in the descending colon.  Pathology revealed melanosis coli, tubular adenoma, and hyperplastic polyps.  There was no dysplasia or malignancy.  Following her colonoscopy, she had bronchitis.  She has a residual cough of clear thick sputum.  She denies any shortness of breath.  She has not been back to the gym in awhile.  Chest CT without contrast on 04/28/2015 revealed stable postoperative findings and scarring in the right lung.  Given the slight nodularity along the wedge resection site, periodic surveillance is likely warranted.  There was a 7 x 6 mm right upper lobe nodule (unchanged from 02/05/2014). Surveillance was recommended.  Thre was stable chronic hydronephrosis on the left with extrarenal pelvis.  There was centrilobular emphysema.   Past Medical History  Diagnosis Date  . Tachycardia, paroxysmal ([place]) 03/31/14  . Carotid artery stenosis   . Brain aneurysm   . Colonic polyp   . Hiatal hernia   . GERD (gastroesophageal reflux disease)   . Plantar fasciitis   . Hypertension   . Hypercholesteremia   . Lung nodule 06/21/2014  . Cancer [facility]) lung    2016    Past Surgical History   Procedure Laterality Date  . Cervix lesion destruction    . Breast lumpectomy      benign  . Tubal ligation    . Breast biopsy Right     neg  . Colonoscopy with propofol N/A 03/31/2015    Procedure: COLONOSCOPY WITH PROPOFOL;  Surgeon: Manya Silvas, MD;  Location: [facility] ENDOSCOPY;  Service: Endoscopy;  Laterality: N/A;    Family History  Problem Relation Age of Onset  . Alcohol abuse Mother   . Heart attack Father     Social History:  reports that she quit smoking about 26 years ago. She does not have any smokeless tobacco history on file. She reports that she drinks alcohol. She reports that she does not use illicit drugs.  The patient is accompanied by her husband today.  Allergies:  Allergies  Allergen Reactions  . Contrast Media [Iodinated Diagnostic Agents]     Current Medications: Current Outpatient Prescriptions  Medication Sig Dispense Refill  . atenolol (TENORMIN) 50 MG tablet Take 50 mg by mouth at bedtime.    . cetirizine (ZYRTEC) 10 MG tablet Take 10 mg by mouth daily.    . Cholecalciferol 1000 UNITS tablet Take 1,000 Units by mouth daily.    . Coenzyme Q10 (CO Q 10) 100 MG CAPS Take 1 capsule by mouth daily.    . hydrochlorothiazide (HYDRODIURIL) 25 MG tablet Take 25 mg by mouth daily.    Marland Kitchen lisinopril-hydrochlorothiazide (PRINZIDE,ZESTORETIC) 20-12.5 MG per tablet Take 1 tablet by  mouth daily.    Marland Kitchen RESVERATROL PO Take 1 capsule by mouth daily.    . simvastatin (ZOCOR) 20 MG tablet Take 20 mg by mouth daily.    Marland Kitchen nystatin (MYCOSTATIN/NYSTOP) 100000 UNIT/GM POWD Apply 1 Bottle topically 3 (three) times daily. Reported on 05/01/2015    . ranitidine (ZANTAC) 150 MG tablet Take 150 mg by mouth 2 (two) times daily. Reported on 05/01/2015     No current facility-administered medications for this visit.   Facility-Administered Medications Ordered in Other Visits  Medication Dose Route Frequency Provider Last Rate Last Dose  . 0.9 %  sodium chloride infusion    Intravenous Continuous Manya Silvas, MD        Review of Systems:  GENERAL:  Feels pretty good.  No fevers, sweats or weight loss. PERFORMANCE STATUS (ECOG):  0 HEENT:  No visual changes, runny nose, sore throat, mouth sores or tenderness. Lungs:  Resolving bronchitis.  Residual clear clough.  No shortness of breath.  No hemoptysis. Cardiac:  No chest pain, palpitations, orthopnea, or PND. GI:  No nausea, vomiting, diarrhea, constipation, melena or hematochezia. Interval colonoscopy. GU:  No urgency, frequency, dysuria, or hematuria. Musculoskeletal:  No back pain.  No joint pain.  No muscle tenderness. Extremities:  No pain or swelling. Skin:  No rashes or skin changes. Neuro:  No headache, numbness or weakness, balance or coordination issues. Endocrine:  No diabetes, thyroid issues, hot flashes or night sweats. Psych:  No mood changes, depression or anxiety. Pain:  No focal pain. Review of systems:  All other systems reviewed and found to be negative.  Physical Exam: Blood pressure 127/75, pulse 60, temperature 97.6 F (36.4 C), temperature source Oral, resp. rate 20, weight 145 lb 1 oz (65.8 kg), SpO2 94 %. GENERAL:  Well developed, well nourished, sitting comfortably in the exam room in no acute distress. MENTAL STATUS:  Alert and oriented to person, place and time. HEAD:  Wavy auburn hair.  Normocephalic, atraumatic, face symmetric, no Cushingoid features. EYES:  Brown eyes.  Pupils equal round and reactive to light and accomodation.  No conjunctivitis or scleral icterus. ENT:  Oropharynx clear without lesion.  Tongue normal. Mucous membranes moist.  RESPIRATORY:  Clear to auscultation without rales, wheezes or rhonchi. CARDIOVASCULAR:  Regular rate and rhythm without murmur, rub or gallop. ABDOMEN:  Soft, non-tender, with active bowel sounds, and no hepatosplenomegaly.  No masses. SKIN:  No rashes, ulcers or lesions. EXTREMITIES: No edema, no skin discoloration or  tenderness.  No palpable cords. LYMPH NODES: No palpable cervical, supraclavicular, axillary or inguinal adenopathy  NEUROLOGICAL: Unremarkable. PSYCH:  Appropriate.  Appointment on 05/01/2015  Component Date Value Ref Range Status  . WBC 05/01/2015 6.1  3.6 - 11.0 K/uL Final  . RBC 05/01/2015 4.54  3.80 - 5.20 MIL/uL Final  . Hemoglobin 05/01/2015 15.5  12.0 - 16.0 g/dL Final  . HCT 05/01/2015 45.3  35.0 - 47.0 % Final  . MCV 05/01/2015 99.6  80.0 - 100.0 fL Final  . MCH 05/01/2015 34.2* 26.0 - 34.0 pg Final  . MCHC 05/01/2015 34.3  32.0 - 36.0 g/dL Final  . RDW 05/01/2015 13.6  11.5 - 14.5 % Final  . Platelets 05/01/2015 167  150 - 440 K/uL Final  . Neutrophils Relative % 05/01/2015 51   Final  . Neutro Abs 05/01/2015 3.1  1.4 - 6.5 K/uL Final  . Lymphocytes Relative 05/01/2015 35   Final  . Lymphs Abs 05/01/2015 2.1  1.0 - 3.6 K/uL  Final  . Monocytes Relative 05/01/2015 9   Final  . Monocytes Absolute 05/01/2015 0.6  0.2 - 0.9 K/uL Final  . Eosinophils Relative 05/01/2015 4   Final  . Eosinophils Absolute 05/01/2015 0.2  0 - 0.7 K/uL Final  . Basophils Relative 05/01/2015 1   Final  . Basophils Absolute 05/01/2015 0.0  0 - 0.1 K/uL Final  . Sodium 05/01/2015 136  135 - 145 mmol/L Final  . Potassium 05/01/2015 4.3  3.5 - 5.1 mmol/L Final  . Chloride 05/01/2015 102  101 - 111 mmol/L Final  . CO2 05/01/2015 28  22 - 32 mmol/L Final  . Glucose, Bld 05/01/2015 104* 65 - 99 mg/dL Final  . BUN 05/01/2015 32* 6 - 20 mg/dL Final  . Creatinine, Ser 05/01/2015 0.79  0.44 - 1.00 mg/dL Final  . Calcium 05/01/2015 9.3  8.9 - 10.3 mg/dL Final  . Total Protein 05/01/2015 7.4  6.5 - 8.1 g/dL Final  . Albumin 05/01/2015 4.6  3.5 - 5.0 g/dL Final  . AST 05/01/2015 22  15 - 41 U/L Final  . ALT 05/01/2015 22  14 - 54 U/L Final  . Alkaline Phosphatase 05/01/2015 58  38 - 126 U/L Final  . Total Bilirubin 05/01/2015 1.1  0.3 - 1.2 mg/dL Final  . GFR calc non Af Amer 05/01/2015 >60  >60 mL/min  Final  . GFR calc Af Amer 05/01/2015 >60  >60 mL/min Final   Comment: (NOTE) The eGFR has been calculated using the CKD EPI equation. This calculation has not been validated in all clinical situations. eGFR's persistently <60 mL/min signify possible Chronic Kidney Disease.   . Anion gap 05/01/2015 6  5 - 15 Final    Assessment:  Heather [name] is a 80 y.o. female with stage IB and adenocarcinoma of the right upper lobe status post wedge resection on 03/13/2014. Pathology revealed a 1.1 cm high-grade adenocarcinoma with pleural invasion. Nodes were negative. Pathologic stage was T2aN1M0 (stage IB).  PET scan on 02/13/2014 revealed a 1 cm right upper lobe hypermetabolic nodule (SUV 3.8) and a 6 mm right upper lobe nodule (no metabolic activity). There was no mediastianl adenopathy.  CXR on 06/28/2014 revealed post-operative changes on the right with upper lobe scarring.  Chest CT on 09/27/2014 revealed images consistent with right upper lobe wedge resection.  There was slight pleural parenchymal nodular thickening and mild distortion consistent with post operative scarring.  CXR on 12/31/2014 revealed a stable right apical postsurgical change most consistent with scarring.    Chest CT on 04/28/2015 revealed stable postoperative findings and scarring in the right lung.  There was slight nodularity along the wedge resection site.  There was a 7 x 6 mm right upper lobe nodule (unchanged from 02/05/2014).  CEA was 4.7 on 10/01/2014.  Symptomatically, she is doing well. She is recovering from bronchitis.  Exam is unremarkable.   Plan: 1.  Review chest CT - done. 2.  Labs today:  CBC with diff, CMP. 3.  Anticipate next CXR in 3 months and next chest CT in 6 months. 4.  RTC in 3 months for MD assessment, labs (CBC, CMP, CEA), and review of CXR.   Lequita Asal, MD  05/01/2015, 12:05 PM

## 2015-05-02 ENCOUNTER — Other Ambulatory Visit: Payer: Self-pay

## 2015-05-02 DIAGNOSIS — C3411 Malignant neoplasm of upper lobe, right bronchus or lung: Secondary | ICD-10-CM

## 2015-06-03 DIAGNOSIS — E860 Dehydration: Secondary | ICD-10-CM | POA: Diagnosis not present

## 2015-06-03 DIAGNOSIS — A084 Viral intestinal infection, unspecified: Secondary | ICD-10-CM | POA: Diagnosis not present

## 2015-06-05 DIAGNOSIS — R110 Nausea: Secondary | ICD-10-CM | POA: Diagnosis not present

## 2015-06-05 DIAGNOSIS — A084 Viral intestinal infection, unspecified: Secondary | ICD-10-CM | POA: Diagnosis not present

## 2015-06-07 DIAGNOSIS — R110 Nausea: Secondary | ICD-10-CM | POA: Diagnosis not present

## 2015-06-07 DIAGNOSIS — E871 Hypo-osmolality and hyponatremia: Secondary | ICD-10-CM | POA: Diagnosis not present

## 2015-06-09 DIAGNOSIS — G9340 Encephalopathy, unspecified: Secondary | ICD-10-CM | POA: Diagnosis not present

## 2015-06-09 DIAGNOSIS — E871 Hypo-osmolality and hyponatremia: Secondary | ICD-10-CM | POA: Diagnosis not present

## 2015-06-09 DIAGNOSIS — E782 Mixed hyperlipidemia: Secondary | ICD-10-CM | POA: Insufficient documentation

## 2015-06-09 HISTORY — DX: Mixed hyperlipidemia: E78.2

## 2015-06-11 DIAGNOSIS — E871 Hypo-osmolality and hyponatremia: Secondary | ICD-10-CM | POA: Diagnosis not present

## 2015-06-19 DIAGNOSIS — Z79899 Other long term (current) drug therapy: Secondary | ICD-10-CM | POA: Diagnosis not present

## 2015-06-19 DIAGNOSIS — I471 Supraventricular tachycardia: Secondary | ICD-10-CM | POA: Diagnosis not present

## 2015-06-19 DIAGNOSIS — E782 Mixed hyperlipidemia: Secondary | ICD-10-CM | POA: Diagnosis not present

## 2015-06-23 ENCOUNTER — Encounter: Payer: Self-pay | Admitting: Hematology and Oncology

## 2015-06-26 ENCOUNTER — Other Ambulatory Visit: Payer: Self-pay | Admitting: Internal Medicine

## 2015-06-26 DIAGNOSIS — G9340 Encephalopathy, unspecified: Secondary | ICD-10-CM | POA: Diagnosis not present

## 2015-06-26 DIAGNOSIS — Z1231 Encounter for screening mammogram for malignant neoplasm of breast: Secondary | ICD-10-CM

## 2015-06-26 DIAGNOSIS — I671 Cerebral aneurysm, nonruptured: Secondary | ICD-10-CM | POA: Diagnosis not present

## 2015-06-26 DIAGNOSIS — Z0000 Encounter for general adult medical examination without abnormal findings: Secondary | ICD-10-CM | POA: Diagnosis not present

## 2015-07-08 DIAGNOSIS — R300 Dysuria: Secondary | ICD-10-CM | POA: Diagnosis not present

## 2015-07-15 ENCOUNTER — Ambulatory Visit
Admission: RE | Admit: 2015-07-15 | Discharge: 2015-07-15 | Disposition: A | Discharge status: Home / Self Care | Payer: PPO | Source: Ambulatory Visit | Attending: Internal Medicine | Admitting: Internal Medicine

## 2015-07-15 ENCOUNTER — Other Ambulatory Visit: Payer: Self-pay | Admitting: Internal Medicine

## 2015-07-15 DIAGNOSIS — I671 Cerebral aneurysm, nonruptured: Secondary | ICD-10-CM | POA: Diagnosis not present

## 2015-07-15 DIAGNOSIS — I6522 Occlusion and stenosis of left carotid artery: Secondary | ICD-10-CM | POA: Diagnosis not present

## 2015-07-18 DIAGNOSIS — Z0000 Encounter for general adult medical examination without abnormal findings: Secondary | ICD-10-CM | POA: Diagnosis not present

## 2015-07-21 ENCOUNTER — Ambulatory Visit
Admission: RE | Admit: 2015-07-21 | Discharge: 2015-07-21 | Disposition: A | Discharge status: Home / Self Care | Payer: PPO | Source: Ambulatory Visit | Attending: Internal Medicine | Admitting: Internal Medicine

## 2015-07-21 ENCOUNTER — Other Ambulatory Visit: Payer: Self-pay | Admitting: Internal Medicine

## 2015-07-21 DIAGNOSIS — Z1231 Encounter for screening mammogram for malignant neoplasm of breast: Secondary | ICD-10-CM

## 2015-07-25 DIAGNOSIS — I471 Supraventricular tachycardia: Secondary | ICD-10-CM | POA: Diagnosis not present

## 2015-07-25 DIAGNOSIS — I671 Cerebral aneurysm, nonruptured: Secondary | ICD-10-CM | POA: Diagnosis not present

## 2015-07-25 DIAGNOSIS — E871 Hypo-osmolality and hyponatremia: Secondary | ICD-10-CM | POA: Diagnosis not present

## 2015-07-30 ENCOUNTER — Other Ambulatory Visit: Payer: Self-pay

## 2015-07-30 DIAGNOSIS — C3411 Malignant neoplasm of upper lobe, right bronchus or lung: Secondary | ICD-10-CM

## 2015-07-31 ENCOUNTER — Encounter: Payer: Self-pay | Admitting: Hematology and Oncology

## 2015-07-31 ENCOUNTER — Inpatient Hospital Stay (HOSPITAL_BASED_OUTPATIENT_CLINIC_OR_DEPARTMENT_OTHER): Payer: PPO | Admitting: Hematology and Oncology

## 2015-07-31 ENCOUNTER — Ambulatory Visit
Admission: RE | Admit: 2015-07-31 | Discharge: 2015-07-31 | Disposition: A | Discharge status: Home / Self Care | Payer: PPO | Source: Ambulatory Visit | Attending: Hematology and Oncology | Admitting: Hematology and Oncology

## 2015-07-31 ENCOUNTER — Inpatient Hospital Stay: Payer: PPO | Attending: Hematology and Oncology

## 2015-07-31 ENCOUNTER — Other Ambulatory Visit: Payer: Self-pay

## 2015-07-31 VITALS — BP 169/89 | HR 76 | Temp 97.5°F | Ht 62.5 in | Wt 147.8 lb

## 2015-07-31 DIAGNOSIS — Z86010 Personal history of colonic polyps: Secondary | ICD-10-CM | POA: Diagnosis not present

## 2015-07-31 DIAGNOSIS — Z87440 Personal history of urinary (tract) infections: Secondary | ICD-10-CM

## 2015-07-31 DIAGNOSIS — C3411 Malignant neoplasm of upper lobe, right bronchus or lung: Secondary | ICD-10-CM | POA: Insufficient documentation

## 2015-07-31 DIAGNOSIS — E7800 Pure hypercholesterolemia, unspecified: Secondary | ICD-10-CM | POA: Insufficient documentation

## 2015-07-31 DIAGNOSIS — K449 Diaphragmatic hernia without obstruction or gangrene: Secondary | ICD-10-CM | POA: Insufficient documentation

## 2015-07-31 DIAGNOSIS — I671 Cerebral aneurysm, nonruptured: Secondary | ICD-10-CM | POA: Diagnosis not present

## 2015-07-31 DIAGNOSIS — R918 Other nonspecific abnormal finding of lung field: Secondary | ICD-10-CM | POA: Insufficient documentation

## 2015-07-31 DIAGNOSIS — Z9889 Other specified postprocedural states: Secondary | ICD-10-CM | POA: Insufficient documentation

## 2015-07-31 DIAGNOSIS — I6529 Occlusion and stenosis of unspecified carotid artery: Secondary | ICD-10-CM

## 2015-07-31 DIAGNOSIS — I10 Essential (primary) hypertension: Secondary | ICD-10-CM

## 2015-07-31 DIAGNOSIS — R000 Tachycardia, unspecified: Secondary | ICD-10-CM

## 2015-07-31 DIAGNOSIS — Z79899 Other long term (current) drug therapy: Secondary | ICD-10-CM | POA: Insufficient documentation

## 2015-07-31 DIAGNOSIS — M722 Plantar fascial fibromatosis: Secondary | ICD-10-CM | POA: Insufficient documentation

## 2015-07-31 DIAGNOSIS — Z87891 Personal history of nicotine dependence: Secondary | ICD-10-CM

## 2015-07-31 DIAGNOSIS — K219 Gastro-esophageal reflux disease without esophagitis: Secondary | ICD-10-CM

## 2015-07-31 LAB — CBC WITH DIFFERENTIAL/PLATELET
Basophils Absolute: 0 10*3/uL (ref 0–0.1)
Basophils Relative: 1 %
Eosinophils Absolute: 0.2 10*3/uL (ref 0–0.7)
Eosinophils Relative: 4 %
HCT: 41.2 % (ref 35.0–47.0)
Hemoglobin: 14.4 g/dL (ref 12.0–16.0)
Lymphocytes Relative: 31 %
Lymphs Abs: 1.5 10*3/uL (ref 1.0–3.6)
MCH: 34.7 pg — ABNORMAL HIGH (ref 26.0–34.0)
MCHC: 34.9 g/dL (ref 32.0–36.0)
MCV: 99.4 fL (ref 80.0–100.0)
Monocytes Absolute: 0.4 10*3/uL (ref 0.2–0.9)
Monocytes Relative: 8 %
Neutro Abs: 2.8 10*3/uL (ref 1.4–6.5)
Neutrophils Relative %: 56 %
Platelets: 180 10*3/uL (ref 150–440)
RBC: 4.15 MIL/uL (ref 3.80–5.20)
RDW: 14.2 % (ref 11.5–14.5)
WBC: 4.9 10*3/uL (ref 3.6–11.0)

## 2015-07-31 LAB — COMPREHENSIVE METABOLIC PANEL
ALT: 33 U/L (ref 14–54)
AST: 35 U/L (ref 15–41)
Albumin: 4.3 g/dL (ref 3.5–5.0)
Alkaline Phosphatase: 56 U/L (ref 38–126)
Anion gap: 9 (ref 5–15)
BUN: 24 mg/dL — ABNORMAL HIGH (ref 6–20)
CO2: 28 mmol/L (ref 22–32)
Calcium: 9.2 mg/dL (ref 8.9–10.3)
Chloride: 103 mmol/L (ref 101–111)
Creatinine, Ser: 0.88 mg/dL (ref 0.44–1.00)
GFR calc Af Amer: >60 mL/min (ref >60)
GFR calc non Af Amer: >60 mL/min (ref >60)
Glucose, Bld: 96 mg/dL (ref 65–99)
Potassium: 4.3 mmol/L (ref 3.5–5.1)
Sodium: 140 mmol/L (ref 135–145)
Total Bilirubin: 0.5 mg/dL (ref 0.3–1.2)
Total Protein: 7 g/dL (ref 6.5–8.1)

## 2015-07-31 NOTE — Progress Notes (Signed)
Patient here for follow up. She is experiencing moderate chest congestion for the past week. O2 sat 93%

## 2015-07-31 NOTE — Progress Notes (Signed)
Heather [name] is a 80 y.o. 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  HPI: The patient was last seen in the medical oncology clinic on 05/01/2014.  At that time, she was doing well. She was recovering from bronchitis.  Exam was unremarkable.  Chest CT on 04/28/2015 revealed stable postoperative findings and scarring in the right lung.    CXR on 07/31/2015 revealed post surgical changes in the right upper lobe.  Symptomatically, she describes some chest congestion in March. She also had a UTI. She had some confusion. She states that her memory is now better.  She had a head MRA on 07/15/2015. Scan was positive for small 3-4 mm distal left ICA aneurysm.  Bilateral mammogram on 07/21/2015 revealed no evidence of malignancy.    Past Medical History  Diagnosis Date  . Tachycardia, paroxysmal ([place]) 03/31/14  . Carotid artery stenosis   . Brain aneurysm   . Colonic polyp   . Hiatal hernia   . GERD (gastroesophageal reflux disease)   . Plantar fasciitis   . Hypertension   . Hypercholesteremia   . Lung nodule 06/21/2014  . Cancer [facility]) lung    2016    Past Surgical History  Procedure Laterality Date  . Cervix lesion destruction    . Breast lumpectomy      benign  . Tubal ligation    . Colonoscopy with propofol N/A 03/31/2015    Procedure: COLONOSCOPY WITH PROPOFOL;  Surgeon: Manya Silvas, MD;  Location: [facility] ENDOSCOPY;  Service: Endoscopy;  Laterality: N/A;  . Breast excisional biopsy Right 40 yrs ago    neg    Family History  Problem Relation Age of Onset  . Alcohol abuse Mother   . Heart attack Father   . Breast cancer Neg Hx     Social History:  reports that she quit smoking about 27 years ago. She does not have any smokeless tobacco history on file. She reports that she drinks alcohol. She reports  that she does not use illicit drugs.  The patient is alone today.  Allergies:  Allergies  Allergen Reactions  . Contrast Media [Iodinated Diagnostic Agents]     Current Medications: Current Outpatient Prescriptions  Medication Sig Dispense Refill  . ALPRAZolam (XANAX) 0.25 MG tablet     . atenolol (TENORMIN) 50 MG tablet Take 50 mg by mouth at bedtime.    . cetirizine (ZYRTEC) 10 MG tablet Take 10 mg by mouth daily.    . Cholecalciferol 1000 UNITS tablet Take 1,000 Units by mouth daily.    . Coenzyme Q10 (CO Q 10) 100 MG CAPS Take 1 capsule by mouth daily.    . fluticasone (FLONASE) 50 MCG/ACT nasal spray Place into the nose.    . hydrochlorothiazide (HYDRODIURIL) 25 MG tablet Take 25 mg by mouth daily.    Marland Kitchen lisinopril-hydrochlorothiazide (PRINZIDE,ZESTORETIC) 20-12.5 MG per tablet Take 1 tablet by mouth daily.    Marland Kitchen RESVERATROL PO Take 1 capsule by mouth daily.    . simvastatin (ZOCOR) 20 MG tablet Take 20 mg by mouth daily.     No current facility-administered medications for this visit.   Facility-Administered Medications Ordered in Other Visits  Medication Dose Route Frequency Provider Last Rate Last Dose  . 0.9 %  sodium chloride infusion   Intravenous Continuous Manya Silvas, MD  Review of Systems:  GENERAL:  Feels good.  No fevers or sweats.  Weightup 2 pounds. PERFORMANCE STATUS (ECOG):  0 HEENT:  No visual changes, runny nose, sore throat, mouth sores or tenderness. Lungs:  No shortness of breath or cough.  No hemoptysis. Cardiac:  No chest pain, palpitations, orthopnea, or PND. GI:  No nausea, vomiting, diarrhea, constipation, melena or hematochezia. Interval colonoscopy. GU:  No urgency, frequency, dysuria, or hematuria.  Interval UTI. Musculoskeletal:  No back pain.  No joint pain.  No muscle tenderness. Extremities:  No pain or swelling. Skin:  No rashes or skin changes. Neuro:  No headache, numbness or weakness, balance or coordination  issues. Endocrine:  No diabetes, thyroid issues, hot flashes or night sweats. Psych:  No mood changes, depression or anxiety. Pain:  No focal pain. Review of systems:  All other systems reviewed and found to be negative.  Physical Exam: Blood pressure 169/89, pulse 76, temperature 97.5 F (36.4 C), temperature source Tympanic, height 5' 2.5" (1.588 m), weight 147 lb 13.1 oz (67.05 kg), SpO2 93 %. GENERAL:  Well developed, well nourished, woman sitting comfortably in the exam room in no acute distress. MENTAL STATUS:  Alert and oriented to person, place and time. HEAD:  [name] styled hair.  Normocephalic, atraumatic, face symmetric, no Cushingoid features. EYES:  Red glasses.  Brown eyes.  Arcus.  Pupils equal round and reactive to light and accomodation.  No conjunctivitis or scleral icterus. ENT:  Oropharynx clear without lesion.  Tongue normal. Mucous membranes moist.  RESPIRATORY:  Clear to auscultation without rales, wheezes or rhonchi. CARDIOVASCULAR:  Regular rate and rhythm without murmur, rub or gallop. ABDOMEN:  Soft, non-tender, with active bowel sounds, and no hepatosplenomegaly.  No masses. SKIN:  No rashes, ulcers or lesions. EXTREMITIES: No edema, no skin discoloration or tenderness.  No palpable cords. LYMPH NODES: No palpable cervical, supraclavicular, axillary or inguinal adenopathy  NEUROLOGICAL: Unremarkable. PSYCH:  Appropriate.   Orders Only on 07/31/2015  Component Date Value Ref Range Status  . WBC 07/31/2015 4.9  3.6 - 11.0 K/uL Final  . RBC 07/31/2015 4.15  3.80 - 5.20 MIL/uL Final  . Hemoglobin 07/31/2015 14.4  12.0 - 16.0 g/dL Final  . HCT 07/31/2015 41.2  35.0 - 47.0 % Final  . MCV 07/31/2015 99.4  80.0 - 100.0 fL Final  . MCH 07/31/2015 34.7* 26.0 - 34.0 pg Final  . MCHC 07/31/2015 34.9  32.0 - 36.0 g/dL Final  . RDW 07/31/2015 14.2  11.5 - 14.5 % Final  . Platelets 07/31/2015 180  150 - 440 K/uL Final  . Neutrophils Relative % 07/31/2015 56   Final  .  Neutro Abs 07/31/2015 2.8  1.4 - 6.5 K/uL Final  . Lymphocytes Relative 07/31/2015 31   Final  . Lymphs Abs 07/31/2015 1.5  1.0 - 3.6 K/uL Final  . Monocytes Relative 07/31/2015 8   Final  . Monocytes Absolute 07/31/2015 0.4  0.2 - 0.9 K/uL Final  . Eosinophils Relative 07/31/2015 4   Final  . Eosinophils Absolute 07/31/2015 0.2  0 - 0.7 K/uL Final  . Basophils Relative 07/31/2015 1   Final  . Basophils Absolute 07/31/2015 0.0  0 - 0.1 K/uL Final    Assessment:  Heather [name] is a 80 y.o. 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 Pathology revealed a 1.1 cm high-grade adenocarcinoma with pleural invasion. Nodes were negative. Pathologic stage was T2aN1M0 (stage IB).  PET  scan on 02/13/2014 revealed a 1 cm right upper lobe hypermetabolic nodule (SUV 3.8) and a 6 mm right upper lobe nodule (no metabolic activity). There was no mediastianl adenopathy.  CXR on 06/28/2014 revealed post-operative changes on the right with upper lobe scarring.  Chest CT on 09/27/2014 revealed images consistent with right upper lobe wedge resection.  There was slight pleural parenchymal nodular thickening and mild distortion consistent with post operative scarring.  CXR on 12/31/2014 revealed a stable right apical postsurgical change most consistent with scarring.    Chest CT on 04/28/2015 revealed stable postoperative findings and scarring in the right lung.  There was slight nodularity along the wedge resection site.  There was a 7 x 6 mm right upper lobe nodule (unchanged from 02/05/2014).  CXR on 07/31/2015 revealed post surgical changes in the right upper lobe.  CEA was 4.7 on 10/01/2014 and 4.5 on 07/31/2015.  Symptomatically, she is doing well. Exam is unremarkable.   Plan: 1.  Labs today:  CBC with diff, CMP, CEA. 2.  CXR (PA and lateral) today. 3.  Schedule chest CT on 10/28/2015 - f/u lung cancer. 4.  RTC in 3 months for MD assessment, labs (CBC  with diff, CMP) and review of chest CT.   Lequita Asal, MD  07/31/2015, 9:29 AM

## 2015-08-01 LAB — CEA: CEA: 4.5 ng/mL (ref 0.0–4.7)

## 2015-09-01 DIAGNOSIS — I671 Cerebral aneurysm, nonruptured: Secondary | ICD-10-CM | POA: Diagnosis not present

## 2015-10-09 DIAGNOSIS — R112 Nausea with vomiting, unspecified: Secondary | ICD-10-CM | POA: Diagnosis not present

## 2015-10-09 DIAGNOSIS — L570 Actinic keratosis: Secondary | ICD-10-CM | POA: Diagnosis not present

## 2015-10-09 DIAGNOSIS — G5701 Lesion of sciatic nerve, right lower limb: Secondary | ICD-10-CM | POA: Diagnosis not present

## 2015-10-28 ENCOUNTER — Ambulatory Visit
Admission: RE | Admit: 2015-10-28 | Discharge: 2015-10-28 | Disposition: A | Discharge status: Home / Self Care | Payer: PPO | Source: Ambulatory Visit | Attending: Hematology and Oncology | Admitting: Hematology and Oncology

## 2015-10-28 DIAGNOSIS — R911 Solitary pulmonary nodule: Secondary | ICD-10-CM | POA: Insufficient documentation

## 2015-10-28 DIAGNOSIS — C3411 Malignant neoplasm of upper lobe, right bronchus or lung: Secondary | ICD-10-CM

## 2015-11-02 NOTE — Progress Notes (Signed)
[facility] day:  11/03/15   Chief Complaint: Heather [name] is a 80 y.o. 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  HPI: The patient was last seen in the medical oncology clinic on 07/31/2015.  At that time, she was doing well. Labs were normal including a CBC with diff, CMP, and CEA (4.5).  CXR revealed post surgical changes in the right upper lobe.  Chest CT on 10/28/2015 revealed no evidence of lung cancer recurrence following right upper lobe wedge resection.  There was stable 7 mm perihilar pulmonary nodule in the right upper lobe  During the interim, she notes being diagnosed with a urinary tract infection.  She states it "wiped my memory out".  She has "slowly come back".  She thought her husband was her father.  She was quite confused for awhile.  She is now back to herself.  She denies any respiratory symptoms.   Past Medical History:  Diagnosis Date  . Brain aneurysm   . Cancer ([place]) lung   2016  . Carotid artery stenosis   . Colonic polyp   . GERD (gastroesophageal reflux disease)   . Hiatal hernia   . Hypercholesteremia   . Hypertension   . Lung nodule 06/21/2014  . Plantar fasciitis   . Tachycardia, paroxysmal ([place]) 03/31/14    Past Surgical History:  Procedure Laterality Date  . BREAST EXCISIONAL BIOPSY Right 40 yrs ago   neg  . BREAST LUMPECTOMY     benign  . CERVIX LESION DESTRUCTION    . COLONOSCOPY WITH PROPOFOL N/A 03/31/2015   Procedure: COLONOSCOPY WITH PROPOFOL;  Surgeon: Manya Silvas, MD;  Location: [facility] ENDOSCOPY;  Service: Endoscopy;  Laterality: N/A;  . TUBAL LIGATION      Family History  Problem Relation Age of Onset  . Alcohol abuse Mother   . Heart attack Father   . Breast cancer Neg Hx     Social History:  reports that she quit smoking about 27 years ago. She quit after 35.00 years of use. She does not have any  smokeless tobacco history on file. She reports that she drinks alcohol. She reports that she does not use drugs.  The patient is alone today.  Allergies:  Allergies  Allergen Reactions  . Contrast Media [Iodinated Diagnostic Agents]     Current Medications: Current Outpatient Prescriptions  Medication Sig Dispense Refill  . ALPRAZolam (XANAX) 0.25 MG tablet     . atenolol (TENORMIN) 50 MG tablet Take 50 mg by mouth at bedtime.    . cetirizine (ZYRTEC) 10 MG tablet Take 10 mg by mouth daily.    . Cholecalciferol 1000 UNITS tablet Take 1,000 Units by mouth daily.    . Coenzyme Q10 (CO Q 10) 100 MG CAPS Take 1 capsule by mouth daily.    . fluticasone (FLONASE) 50 MCG/ACT nasal spray Place into the nose.    . hydrochlorothiazide (HYDRODIURIL) 25 MG tablet Take 25 mg by mouth daily.    Marland Kitchen lisinopril-hydrochlorothiazide (PRINZIDE,ZESTORETIC) 20-12.5 MG per tablet Take 1 tablet by mouth daily.    Marland Kitchen RESVERATROL PO Take 1 capsule by mouth daily.    . simvastatin (ZOCOR) 20 MG tablet Take 20 mg by mouth daily.     No current facility-administered medications for this visit.    Facility-Administered Medications Ordered in Other Visits  Medication Dose Route Frequency Provider Last Rate Last  Dose  . 0.9 %  sodium chloride infusion   Intravenous Continuous Manya Silvas, MD        Review of Systems:  GENERAL:  Feels "ok".  No fevers or sweats.  Weight down 2 pounds. PERFORMANCE STATUS (ECOG):  0 HEENT:  No visual changes, runny nose, sore throat, mouth sores or tenderness. Lungs:  No shortness of breath or cough.  No hemoptysis. Cardiac:  No chest pain, palpitations, orthopnea, or PND. GI:  No nausea, vomiting, diarrhea, constipation, melena or hematochezia. Interval colonoscopy. GU:  No urgency, frequency, dysuria, or hematuria.  Interval UTI. Musculoskeletal:  No back pain.  No joint pain.  No muscle tenderness. Extremities:  No pain or swelling. Skin:  No rashes or skin  changes. Neuro:  Memory problems with UTI.  No headache, numbness or weakness, balance or coordination issues. Endocrine:  No diabetes, thyroid issues, hot flashes or night sweats. Psych:  No mood changes, depression or anxiety. Pain:  No focal pain. Review of systems:  All other systems reviewed and found to be negative.  Physical Exam: Blood pressure 139/71, pulse 76, temperature (!) 96.1 F (35.6 C), temperature source Tympanic, resp. rate 18, weight 145 lb 8.1 oz (66 kg). GENERAL:  Well developed, well nourished, woman sitting comfortably in the exam room in no acute distress. MENTAL STATUS:  Alert and oriented to person, place and time. HEAD:  A[place]urn styled hair.  Normocephalic, atraumatic, face symmetric, no Cushingoid features. EYES:  Red glasses.  Brown eyes.  Arcus.  Pupils equal round and reactive to light and accomodation.  No conjunctivitis or scleral icterus. ENT:  Oropharynx clear without lesion.  Tongue normal. Mucous membranes moist.  RESPIRATORY:  Clear to auscultation without rales, wheezes or rhonchi. CARDIOVASCULAR:  Regular rate and rhythm without murmur, rub or gallop. ABDOMEN:  Soft, non-tender, with active bowel sounds, and no hepatosplenomegaly.  No masses. SKIN:  No rashes, ulcers or lesions. EXTREMITIES: No edema, no skin discoloration or tenderness.  No palpable cords. LYMPH NODES: No palpable cervical, supraclavicular, axillary or inguinal adenopathy  NEUROLOGICAL: Unremarkable. PSYCH:  Appropriate.   Appointment on 11/03/2015  Component Date Value Ref Range Status  . WBC 11/03/2015 5.1  3.6 - 11.0 K/uL Final  . RBC 11/03/2015 4.37  3.80 - 5.20 MIL/uL Final  . Hemoglobin 11/03/2015 14.9  12.0 - 16.0 g/dL Final  . HCT 11/03/2015 43.4  35.0 - 47.0 % Final  . MCV 11/03/2015 99.4  80.0 - 100.0 fL Final  . MCH 11/03/2015 34.2* 26.0 - 34.0 pg Final  . MCHC 11/03/2015 34.4  32.0 - 36.0 g/dL Final  . RDW 11/03/2015 13.0  11.5 - 14.5 % Final  . Platelets  11/03/2015 124* 150 - 440 K/uL Final  . Neutrophils Relative % 11/03/2015 55  % Final  . Neutro Abs 11/03/2015 2.8  1.4 - 6.5 K/uL Final  . Lymphocytes Relative 11/03/2015 33  % Final  . Lymphs Abs 11/03/2015 1.7  1.0 - 3.6 K/uL Final  . Monocytes Relative 11/03/2015 9  % Final  . Monocytes Absolute 11/03/2015 0.4  0.2 - 0.9 K/uL Final  . Eosinophils Relative 11/03/2015 2  % Final  . Eosinophils Absolute 11/03/2015 0.1  0 - 0.7 K/uL Final  . Basophils Relative 11/03/2015 1  % Final  . Basophils Absolute 11/03/2015 0.0  0 - 0.1 K/uL Final    Assessment:  Heather [name] is a 80 y.o. female with stage IB adenocarcinoma of the right upper lobe status post wedge  resection on 03/13/2014. Pathology revealed a 1.1 cm high-grade adenocarcinoma with pleural invasion. Nodes were negative. Pathologic stage was T2aN1M0 (stage IB).  PET scan on 02/13/2014 revealed a 1 cm right upper lobe hypermetabolic nodule (SUV 3.8) and a 6 mm right upper lobe nodule (no metabolic activity). There was no mediastianl adenopathy.   Chest CT on 09/27/2014 revealed images consistent with right upper lobe wedge resection.  There was slight pleural parenchymal nodular thickening and mild distortion consistent with post operative scarring.  Chest CT on 04/28/2015 revealed stable postoperative findings and scarring in the right lung.  There was slight nodularity along the wedge resection site.  There was a 7 x 6 mm right upper lobe nodule (unchanged from 02/05/2014).   Chest CT on 10/28/2015 revealed no evidence of lung cancer recurrence following right upper lobe wedge resection.  There was stable 7 mm perihilar pulmonary nodule in the right upper lobe.  CEA was 4.7 on 10/01/2014 and 4.5 on 07/31/2015.  Bilateral mammogram on 07/21/2015 revealed no evidence of malignancy.  She had an interval UTI which caused confusion.  Symptomatically, she is doing well. Exam is unremarkable.   Plan: 1.  Labs today:  CBC with diff, CMP. 2.   Review interval chest CT. 3.  Schedule chest CT in 6 months 4.  RTC in 6 months for MD assessment, labs (CBC with diff, CMP) and review of chest CT.   Lequita Asal, MD  11/03/2015, 12:10 PM

## 2015-11-03 ENCOUNTER — Inpatient Hospital Stay (HOSPITAL_BASED_OUTPATIENT_CLINIC_OR_DEPARTMENT_OTHER): Payer: PPO | Admitting: Hematology and Oncology

## 2015-11-03 ENCOUNTER — Encounter (INDEPENDENT_AMBULATORY_CARE_PROVIDER_SITE_OTHER): Payer: Self-pay

## 2015-11-03 ENCOUNTER — Inpatient Hospital Stay: Payer: PPO | Attending: Hematology and Oncology

## 2015-11-03 VITALS — BP 139/71 | HR 76 | Temp 96.1°F | Resp 18 | Wt 145.5 lb

## 2015-11-03 DIAGNOSIS — I6529 Occlusion and stenosis of unspecified carotid artery: Secondary | ICD-10-CM | POA: Insufficient documentation

## 2015-11-03 DIAGNOSIS — C3411 Malignant neoplasm of upper lobe, right bronchus or lung: Secondary | ICD-10-CM

## 2015-11-03 DIAGNOSIS — R000 Tachycardia, unspecified: Secondary | ICD-10-CM

## 2015-11-03 DIAGNOSIS — Z87440 Personal history of urinary (tract) infections: Secondary | ICD-10-CM

## 2015-11-03 DIAGNOSIS — R410 Disorientation, unspecified: Secondary | ICD-10-CM | POA: Diagnosis not present

## 2015-11-03 DIAGNOSIS — R918 Other nonspecific abnormal finding of lung field: Secondary | ICD-10-CM | POA: Diagnosis not present

## 2015-11-03 DIAGNOSIS — Z79899 Other long term (current) drug therapy: Secondary | ICD-10-CM

## 2015-11-03 DIAGNOSIS — K219 Gastro-esophageal reflux disease without esophagitis: Secondary | ICD-10-CM | POA: Insufficient documentation

## 2015-11-03 DIAGNOSIS — I10 Essential (primary) hypertension: Secondary | ICD-10-CM

## 2015-11-03 DIAGNOSIS — E7800 Pure hypercholesterolemia, unspecified: Secondary | ICD-10-CM | POA: Diagnosis not present

## 2015-11-03 DIAGNOSIS — M722 Plantar fascial fibromatosis: Secondary | ICD-10-CM | POA: Diagnosis not present

## 2015-11-03 DIAGNOSIS — Z8669 Personal history of other diseases of the nervous system and sense organs: Secondary | ICD-10-CM

## 2015-11-03 DIAGNOSIS — K449 Diaphragmatic hernia without obstruction or gangrene: Secondary | ICD-10-CM

## 2015-11-03 DIAGNOSIS — Z87442 Personal history of urinary calculi: Secondary | ICD-10-CM

## 2015-11-03 DIAGNOSIS — Z87891 Personal history of nicotine dependence: Secondary | ICD-10-CM

## 2015-11-03 LAB — CBC WITH DIFFERENTIAL/PLATELET
Basophils Absolute: 0 10*3/uL (ref 0–0.1)
Basophils Relative: 1 %
Eosinophils Absolute: 0.1 10*3/uL (ref 0–0.7)
Eosinophils Relative: 2 %
HCT: 43.4 % (ref 35.0–47.0)
Hemoglobin: 14.9 g/dL (ref 12.0–16.0)
Lymphocytes Relative: 33 %
Lymphs Abs: 1.7 10*3/uL (ref 1.0–3.6)
MCH: 34.2 pg — ABNORMAL HIGH (ref 26.0–34.0)
MCHC: 34.4 g/dL (ref 32.0–36.0)
MCV: 99.4 fL (ref 80.0–100.0)
Monocytes Absolute: 0.4 10*3/uL (ref 0.2–0.9)
Monocytes Relative: 9 %
Neutro Abs: 2.8 10*3/uL (ref 1.4–6.5)
Neutrophils Relative %: 55 %
Platelets: 124 10*3/uL — ABNORMAL LOW (ref 150–440)
RBC: 4.37 MIL/uL (ref 3.80–5.20)
RDW: 13 % (ref 11.5–14.5)
WBC: 5.1 10*3/uL (ref 3.6–11.0)

## 2015-11-03 LAB — COMPREHENSIVE METABOLIC PANEL
ALT: 24 U/L (ref 14–54)
AST: 26 U/L (ref 15–41)
Albumin: 4 g/dL (ref 3.5–5.0)
Alkaline Phosphatase: 53 U/L (ref 38–126)
Anion gap: 9 (ref 5–15)
BUN: 20 mg/dL (ref 6–20)
CO2: 28 mmol/L (ref 22–32)
Calcium: 9.4 mg/dL (ref 8.9–10.3)
Chloride: 102 mmol/L (ref 101–111)
Creatinine, Ser: 0.85 mg/dL (ref 0.44–1.00)
GFR calc Af Amer: >60 mL/min (ref >60)
GFR calc non Af Amer: >60 mL/min (ref >60)
Glucose, Bld: 97 mg/dL (ref 65–99)
Potassium: 4.3 mmol/L (ref 3.5–5.1)
Sodium: 139 mmol/L (ref 135–145)
Total Bilirubin: 0.7 mg/dL (ref 0.3–1.2)
Total Protein: 6.8 g/dL (ref 6.5–8.1)

## 2015-11-04 ENCOUNTER — Other Ambulatory Visit: Payer: Self-pay | Admitting: *Deleted

## 2015-11-04 DIAGNOSIS — C3411 Malignant neoplasm of upper lobe, right bronchus or lung: Secondary | ICD-10-CM

## 2015-11-13 DIAGNOSIS — D492 Neoplasm of unspecified behavior of bone, soft tissue, and skin: Secondary | ICD-10-CM | POA: Diagnosis not present

## 2015-11-16 ENCOUNTER — Encounter: Payer: Self-pay | Admitting: Hematology and Oncology

## 2015-11-18 DIAGNOSIS — C44622 Squamous cell carcinoma of skin of right upper limb, including shoulder: Secondary | ICD-10-CM | POA: Diagnosis not present

## 2015-11-18 DIAGNOSIS — D485 Neoplasm of uncertain behavior of skin: Secondary | ICD-10-CM | POA: Diagnosis not present

## 2015-11-18 DIAGNOSIS — L728 Other follicular cysts of the skin and subcutaneous tissue: Secondary | ICD-10-CM | POA: Diagnosis not present

## 2015-11-28 DIAGNOSIS — C44622 Squamous cell carcinoma of skin of right upper limb, including shoulder: Secondary | ICD-10-CM | POA: Diagnosis not present

## 2015-12-31 DIAGNOSIS — Z85828 Personal history of other malignant neoplasm of skin: Secondary | ICD-10-CM | POA: Diagnosis not present

## 2015-12-31 DIAGNOSIS — Z08 Encounter for follow-up examination after completed treatment for malignant neoplasm: Secondary | ICD-10-CM | POA: Diagnosis not present

## 2015-12-31 DIAGNOSIS — D1801 Hemangioma of skin and subcutaneous tissue: Secondary | ICD-10-CM | POA: Diagnosis not present

## 2015-12-31 DIAGNOSIS — Z1283 Encounter for screening for malignant neoplasm of skin: Secondary | ICD-10-CM | POA: Diagnosis not present

## 2015-12-31 DIAGNOSIS — L718 Other rosacea: Secondary | ICD-10-CM | POA: Diagnosis not present

## 2016-01-01 DIAGNOSIS — M5430 Sciatica, unspecified side: Secondary | ICD-10-CM | POA: Diagnosis not present

## 2016-01-14 DIAGNOSIS — I471 Supraventricular tachycardia: Secondary | ICD-10-CM | POA: Diagnosis not present

## 2016-01-21 DIAGNOSIS — I471 Supraventricular tachycardia: Secondary | ICD-10-CM | POA: Diagnosis not present

## 2016-01-21 DIAGNOSIS — E782 Mixed hyperlipidemia: Secondary | ICD-10-CM | POA: Diagnosis not present

## 2016-01-21 DIAGNOSIS — C3411 Malignant neoplasm of upper lobe, right bronchus or lung: Secondary | ICD-10-CM | POA: Diagnosis not present

## 2016-01-22 ENCOUNTER — Other Ambulatory Visit: Payer: Self-pay | Admitting: Internal Medicine

## 2016-01-22 DIAGNOSIS — M5416 Radiculopathy, lumbar region: Secondary | ICD-10-CM

## 2016-02-02 ENCOUNTER — Ambulatory Visit: Payer: PPO

## 2016-02-03 DIAGNOSIS — A419 Sepsis, unspecified organism: Secondary | ICD-10-CM | POA: Diagnosis not present

## 2016-02-03 DIAGNOSIS — M5430 Sciatica, unspecified side: Secondary | ICD-10-CM | POA: Diagnosis not present

## 2016-02-03 DIAGNOSIS — N390 Urinary tract infection, site not specified: Secondary | ICD-10-CM | POA: Diagnosis not present

## 2016-02-03 DIAGNOSIS — R350 Frequency of micturition: Secondary | ICD-10-CM | POA: Diagnosis not present

## 2016-02-03 DIAGNOSIS — R300 Dysuria: Secondary | ICD-10-CM | POA: Diagnosis not present

## 2016-02-09 ENCOUNTER — Ambulatory Visit
Admission: RE | Admit: 2016-02-09 | Discharge: 2016-02-09 | Disposition: A | Discharge status: Home / Self Care | Payer: PPO | Source: Ambulatory Visit | Attending: Internal Medicine | Admitting: Internal Medicine

## 2016-02-09 DIAGNOSIS — M545 Low back pain: Secondary | ICD-10-CM | POA: Diagnosis not present

## 2016-02-09 DIAGNOSIS — M5116 Intervertebral disc disorders with radiculopathy, lumbar region: Secondary | ICD-10-CM | POA: Diagnosis not present

## 2016-02-09 DIAGNOSIS — M4726 Other spondylosis with radiculopathy, lumbar region: Secondary | ICD-10-CM | POA: Insufficient documentation

## 2016-02-09 DIAGNOSIS — M5416 Radiculopathy, lumbar region: Secondary | ICD-10-CM | POA: Diagnosis not present

## 2016-02-10 DIAGNOSIS — N390 Urinary tract infection, site not specified: Secondary | ICD-10-CM | POA: Diagnosis not present

## 2016-02-10 DIAGNOSIS — M51369 Other intervertebral disc degeneration, lumbar region without mention of lumbar back pain or lower extremity pain: Secondary | ICD-10-CM | POA: Insufficient documentation

## 2016-02-10 DIAGNOSIS — A419 Sepsis, unspecified organism: Secondary | ICD-10-CM | POA: Diagnosis not present

## 2016-02-10 DIAGNOSIS — M5136 Other intervertebral disc degeneration, lumbar region: Secondary | ICD-10-CM | POA: Insufficient documentation

## 2016-03-05 DIAGNOSIS — M5116 Intervertebral disc disorders with radiculopathy, lumbar region: Secondary | ICD-10-CM | POA: Diagnosis not present

## 2016-03-09 DIAGNOSIS — M5416 Radiculopathy, lumbar region: Secondary | ICD-10-CM | POA: Diagnosis not present

## 2016-04-05 DIAGNOSIS — B029 Zoster without complications: Secondary | ICD-10-CM | POA: Diagnosis not present

## 2016-04-14 DIAGNOSIS — M5126 Other intervertebral disc displacement, lumbar region: Secondary | ICD-10-CM | POA: Diagnosis not present

## 2016-04-14 DIAGNOSIS — M5416 Radiculopathy, lumbar region: Secondary | ICD-10-CM | POA: Diagnosis not present

## 2016-04-29 ENCOUNTER — Ambulatory Visit: Payer: PPO

## 2016-04-30 ENCOUNTER — Ambulatory Visit: Payer: PPO | Admitting: Hematology and Oncology

## 2016-04-30 ENCOUNTER — Other Ambulatory Visit: Payer: PPO

## 2016-04-30 ENCOUNTER — Ambulatory Visit
Admission: RE | Admit: 2016-04-30 | Discharge: 2016-04-30 | Disposition: A | Discharge status: Home / Self Care | Payer: PPO | Source: Ambulatory Visit | Attending: Hematology and Oncology | Admitting: Hematology and Oncology

## 2016-04-30 DIAGNOSIS — R911 Solitary pulmonary nodule: Secondary | ICD-10-CM | POA: Diagnosis not present

## 2016-04-30 DIAGNOSIS — C3490 Malignant neoplasm of unspecified part of unspecified bronchus or lung: Secondary | ICD-10-CM | POA: Diagnosis not present

## 2016-04-30 DIAGNOSIS — C3411 Malignant neoplasm of upper lobe, right bronchus or lung: Secondary | ICD-10-CM | POA: Diagnosis not present

## 2016-04-30 DIAGNOSIS — K7689 Other specified diseases of liver: Secondary | ICD-10-CM | POA: Diagnosis not present

## 2016-05-03 ENCOUNTER — Inpatient Hospital Stay (HOSPITAL_BASED_OUTPATIENT_CLINIC_OR_DEPARTMENT_OTHER): Payer: PPO | Admitting: Hematology and Oncology

## 2016-05-03 ENCOUNTER — Inpatient Hospital Stay: Payer: PPO | Attending: Hematology and Oncology

## 2016-05-03 VITALS — BP 152/78 | HR 66 | Temp 96.1°F | Resp 18 | Wt 146.5 lb

## 2016-05-03 DIAGNOSIS — Z8669 Personal history of other diseases of the nervous system and sense organs: Secondary | ICD-10-CM

## 2016-05-03 DIAGNOSIS — R000 Tachycardia, unspecified: Secondary | ICD-10-CM | POA: Insufficient documentation

## 2016-05-03 DIAGNOSIS — R918 Other nonspecific abnormal finding of lung field: Secondary | ICD-10-CM

## 2016-05-03 DIAGNOSIS — C3411 Malignant neoplasm of upper lobe, right bronchus or lung: Secondary | ICD-10-CM

## 2016-05-03 DIAGNOSIS — Z86010 Personal history of colonic polyps: Secondary | ICD-10-CM | POA: Insufficient documentation

## 2016-05-03 DIAGNOSIS — B029 Zoster without complications: Secondary | ICD-10-CM | POA: Insufficient documentation

## 2016-05-03 DIAGNOSIS — E7800 Pure hypercholesterolemia, unspecified: Secondary | ICD-10-CM

## 2016-05-03 DIAGNOSIS — Z87891 Personal history of nicotine dependence: Secondary | ICD-10-CM | POA: Diagnosis not present

## 2016-05-03 DIAGNOSIS — I6529 Occlusion and stenosis of unspecified carotid artery: Secondary | ICD-10-CM | POA: Insufficient documentation

## 2016-05-03 DIAGNOSIS — Z79899 Other long term (current) drug therapy: Secondary | ICD-10-CM | POA: Insufficient documentation

## 2016-05-03 DIAGNOSIS — K219 Gastro-esophageal reflux disease without esophagitis: Secondary | ICD-10-CM | POA: Insufficient documentation

## 2016-05-03 DIAGNOSIS — M545 Low back pain: Secondary | ICD-10-CM | POA: Insufficient documentation

## 2016-05-03 DIAGNOSIS — K449 Diaphragmatic hernia without obstruction or gangrene: Secondary | ICD-10-CM | POA: Diagnosis not present

## 2016-05-03 DIAGNOSIS — R911 Solitary pulmonary nodule: Secondary | ICD-10-CM

## 2016-05-03 LAB — COMPREHENSIVE METABOLIC PANEL
ALT: 19 U/L (ref 14–54)
AST: 25 U/L (ref 15–41)
Albumin: 4 g/dL (ref 3.5–5.0)
Alkaline Phosphatase: 48 U/L (ref 38–126)
Anion gap: 7 (ref 5–15)
BUN: 25 mg/dL — ABNORMAL HIGH (ref 6–20)
CO2: 25 mmol/L (ref 22–32)
Calcium: 9.2 mg/dL (ref 8.9–10.3)
Chloride: 107 mmol/L (ref 101–111)
Creatinine, Ser: 0.71 mg/dL (ref 0.44–1.00)
GFR calc Af Amer: >60 mL/min (ref >60)
GFR calc non Af Amer: >60 mL/min (ref >60)
Glucose, Bld: 122 mg/dL — ABNORMAL HIGH (ref 65–99)
Potassium: 4.1 mmol/L (ref 3.5–5.1)
Sodium: 139 mmol/L (ref 135–145)
Total Bilirubin: 0.5 mg/dL (ref 0.3–1.2)
Total Protein: 6.6 g/dL (ref 6.5–8.1)

## 2016-05-03 LAB — CBC WITH DIFFERENTIAL/PLATELET
Basophils Absolute: 0 10*3/uL (ref 0–0.1)
Basophils Relative: 1 %
Eosinophils Absolute: 0.1 10*3/uL (ref 0–0.7)
Eosinophils Relative: 3 %
HCT: 41.2 % (ref 35.0–47.0)
Hemoglobin: 14.3 g/dL (ref 12.0–16.0)
Lymphocytes Relative: 40 %
Lymphs Abs: 2.1 10*3/uL (ref 1.0–3.6)
MCH: 34 pg (ref 26.0–34.0)
MCHC: 34.6 g/dL (ref 32.0–36.0)
MCV: 98.2 fL (ref 80.0–100.0)
Monocytes Absolute: 0.4 10*3/uL (ref 0.2–0.9)
Monocytes Relative: 8 %
Neutro Abs: 2.5 10*3/uL (ref 1.4–6.5)
Neutrophils Relative %: 48 %
Platelets: 153 10*3/uL (ref 150–440)
RBC: 4.2 MIL/uL (ref 3.80–5.20)
RDW: 13.3 % (ref 11.5–14.5)
WBC: 5.2 10*3/uL (ref 3.6–11.0)

## 2016-05-03 NOTE — Progress Notes (Signed)
[facility] day:  05/03/16   Chief Complaint: Heather [name] is a 81 y.o. 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  HPI: The patient was last seen in the medical oncology clinic on 11/03/2015.  At that time, she was doing well. Chest CT on 10/28/2015 revealed no evidence of lung cancer.  There was stable 7 mm perihilar pulmonary nodule in the right upper lobe.  CEA was 4.5 on 07/31/2015.  Chest CT on 04/30/2016 revealed stable surgical changes involving the right upper lung from a prior wedge resection.  There was no findings for recurrent tumor, mediastinal/hilar adenopathy or pulmonary metastatic disease.  There was a stable 7 mm superior segment right lower lobe pulmonary nodule.  The left lower lobe 6 mm pulmonary nodule appeared slightly more solid and nodular when compared to prior examination.   Recommend attention on future scans.  There were no findings for upper abdominal metastatic disease.  During the interim, she notes having a "bout with shingles". Lesions have dried up. She notes some lower back pain due to a ruptured disc. She is following up with Dr. Lacinda Axon at the [facility] She had her second epidural recently.  She denies any respiratory symptoms.   Past Medical History:  Diagnosis Date  . Brain aneurysm   . Cancer ([place]) lung   2016  . Carotid artery stenosis   . Colonic polyp   . GERD (gastroesophageal reflux disease)   . Hiatal hernia   . Hypercholesteremia   . Hypertension   . Lung nodule 06/21/2014  . Plantar fasciitis   . Tachycardia, paroxysmal ([place]) 03/31/14    Past Surgical History:  Procedure Laterality Date  . BREAST EXCISIONAL BIOPSY Right 40 yrs ago   neg  . BREAST LUMPECTOMY     benign  . CERVIX LESION DESTRUCTION    . COLONOSCOPY WITH PROPOFOL N/A 03/31/2015   Procedure: COLONOSCOPY WITH PROPOFOL;  Surgeon: Manya Silvas, MD;  Location: [facility] ENDOSCOPY;  Service: Endoscopy;  Laterality: N/A;  . TUBAL LIGATION      Family History  Problem Relation Age of Onset  . Alcohol abuse Mother   . Heart attack Father   . Breast cancer Neg Hx     Social History:  reports that she quit smoking about 27 years ago. She quit after 35.00 years of use. She does not have any smokeless tobacco history on file. She reports that she drinks alcohol. She reports that she does not use drugs.  The patient is alone today.  Allergies:  Allergies  Allergen Reactions  . Contrast Media [Iodinated Diagnostic Agents]     Current Medications: Current Outpatient Prescriptions  Medication Sig Dispense Refill  . ALPRAZolam (XANAX) 0.25 MG tablet     . atenolol (TENORMIN) 50 MG tablet Take 50 mg by mouth at bedtime.    . cetirizine (ZYRTEC) 10 MG tablet Take 10 mg by mouth daily.    . Cholecalciferol 1000 UNITS tablet Take 1,000 Units by mouth daily.    . Coenzyme Q10 (CO Q 10) 100 MG CAPS Take 1 capsule by mouth daily.    . fluticasone (FLONASE) 50 MCG/ACT nasal spray Place into the nose.    . hydrochlorothiazide (HYDRODIURIL) 25 MG tablet Take 25 mg by mouth daily.    Marland Kitchen lisinopril-hydrochlorothiazide (PRINZIDE,ZESTORETIC) 20-12.5 MG per tablet Take 1 tablet by mouth daily.    Marland Kitchen  RESVERATROL PO Take 1 capsule by mouth daily.    . simvastatin (ZOCOR) 20 MG tablet Take 20 mg by mouth daily.     No current facility-administered medications for this visit.    Facility-Administered Medications Ordered in Other Visits  Medication Dose Route Frequency Provider Last Rate Last Dose  . 0.9 %  sodium chloride infusion   Intravenous Continuous Manya Silvas, MD        Review of Systems:  GENERAL:  Feels "good".  No fevers or sweats.  Weight up 1 pound. PERFORMANCE STATUS (ECOG):  0 HEENT:  No visual changes, runny nose, sore throat, mouth sores or tenderness. Lungs:  No shortness of breath or cough.  No hemoptysis. Cardiac:  No  chest pain, palpitations, orthopnea, or PND. GI:  No nausea, vomiting, diarrhea, constipation, melena or hematochezia. Interval colonoscopy. GU:  No urgency, frequency, dysuria, or hematuria.  Interval UTI. Musculoskeletal:  Low back pain due to ruptured disk (see HPI).  No joint pain.  No muscle tenderness. Extremities:  No pain or swelling. Skin:  No rashes or skin changes. Neuro: No headache, numbness or weakness, balance or coordination issues. Endocrine:  No diabetes, thyroid issues, hot flashes or night sweats. Psych:  No mood changes, depression or anxiety. Pain:  No focal pain. Review of systems:  All other systems reviewed and found to be negative.  Physical Exam: Blood pressure (!) 152/78, pulse 66, temperature (!) 96.1 F (35.6 C), temperature source Tympanic, resp. rate 18, weight 146 lb 8 oz (66.5 kg). GENERAL:  Well developed, well nourished, woman sitting comfortably in the exam room in no acute distress. MENTAL STATUS:  Alert and oriented to person, place and time. HEAD:  A[place]urn styled hair.  Normocephalic, atraumatic, face symmetric, no Cushingoid features. EYES:  Red glasses.  Brown eyes.  Arcus.  Pupils equal round and reactive to light and accomodation.  No conjunctivitis or scleral icterus. ENT:  Oropharynx clear without lesion.  Tongue normal. Mucous membranes moist.  RESPIRATORY:  Clear to auscultation without rales, wheezes or rhonchi. CARDIOVASCULAR:  Regular rate and rhythm without murmur, rub or gallop. ABDOMEN:  Soft, non-tender, with active bowel sounds, and no hepatosplenomegaly.  No masses. SKIN:  No rashes, ulcers or lesions. EXTREMITIES: No edema, no skin discoloration or tenderness.  No palpable cords. LYMPH NODES: No palpable cervical, supraclavicular, axillary or inguinal adenopathy  NEUROLOGICAL: Unremarkable. PSYCH:  Appropriate.   Appointment on 05/03/2016  Component Date Value Ref Range Status  . WBC 05/03/2016 5.2  3.6 - 11.0 K/uL Final  . RBC  05/03/2016 4.20  3.80 - 5.20 MIL/uL Final  . Hemoglobin 05/03/2016 14.3  12.0 - 16.0 g/dL Final  . HCT 05/03/2016 41.2  35.0 - 47.0 % Final  . MCV 05/03/2016 98.2  80.0 - 100.0 fL Final  . MCH 05/03/2016 34.0  26.0 - 34.0 pg Final  . MCHC 05/03/2016 34.6  32.0 - 36.0 g/dL Final  . RDW 05/03/2016 13.3  11.5 - 14.5 % Final  . Platelets 05/03/2016 153  150 - 440 K/uL Final  . Neutrophils Relative % 05/03/2016 48  % Final  . Neutro Abs 05/03/2016 2.5  1.4 - 6.5 K/uL Final  . Lymphocytes Relative 05/03/2016 40  % Final  . Lymphs Abs 05/03/2016 2.1  1.0 - 3.6 K/uL Final  . Monocytes Relative 05/03/2016 8  % Final  . Monocytes Absolute 05/03/2016 0.4  0.2 - 0.9 K/uL Final  . Eosinophils Relative 05/03/2016 3  % Final  . Eosinophils  Absolute 05/03/2016 0.1  0 - 0.7 K/uL Final  . Basophils Relative 05/03/2016 1  % Final  . Basophils Absolute 05/03/2016 0.0  0 - 0.1 K/uL Final  . Sodium 05/03/2016 139  135 - 145 mmol/L Final  . Potassium 05/03/2016 4.1  3.5 - 5.1 mmol/L Final  . Chloride 05/03/2016 107  101 - 111 mmol/L Final  . CO2 05/03/2016 25  22 - 32 mmol/L Final  . Glucose, Bld 05/03/2016 122* 65 - 99 mg/dL Final  . BUN 05/03/2016 25* 6 - 20 mg/dL Final  . Creatinine, Ser 05/03/2016 0.71  0.44 - 1.00 mg/dL Final  . Calcium 05/03/2016 9.2  8.9 - 10.3 mg/dL Final  . Total Protein 05/03/2016 6.6  6.5 - 8.1 g/dL Final  . Albumin 05/03/2016 4.0  3.5 - 5.0 g/dL Final  . AST 05/03/2016 25  15 - 41 U/L Final  . ALT 05/03/2016 19  14 - 54 U/L Final  . Alkaline Phosphatase 05/03/2016 48  38 - 126 U/L Final  . Total Bilirubin 05/03/2016 0.5  0.3 - 1.2 mg/dL Final  . GFR calc non Af Amer 05/03/2016 >60  >60 mL/min Final  . GFR calc Af Amer 05/03/2016 >60  >60 mL/min Final   Comment: (NOTE) The eGFR has been calculated using the CKD EPI equation. This calculation has not been validated in all clinical situations. eGFR's persistently <60 mL/min signify possible Chronic Kidney Disease.   . Anion  gap 05/03/2016 7  5 - 15 Final    Assessment:  Heather [name] is a 81 y.o. 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 Pathology revealed a 1.1 cm high-grade adenocarcinoma with pleural invasion. Nodes were negative. Pathologic stage was T2aN1M0 (stage IB).  PET scan on 02/13/2014 revealed a 1 cm right upper lobe hypermetabolic nodule (SUV 3.8) and a 6 mm right upper lobe nodule (no metabolic activity). There was no mediastianl adenopathy.   Chest CT on 09/27/2014 revealed images consistent with right upper lobe wedge resection.  There was slight pleural parenchymal nodular thickening and mild distortion consistent with post operative scarring.  Chest CT on 04/28/2015 revealed stable postoperative findings and scarring in the right lung.  There was slight nodularity along the wedge resection site.  There was a 7 x 6 mm right upper lobe nodule (unchanged from 02/05/2014).   Chest CT on 10/28/2015 revealed no evidence of lung cancer recurrence following right upper lobe wedge resection.  There was stable 7 mm perihilar pulmonary nodule in the right upper lobe.  Chest CT on 04/30/2016 revealed stable surgical changes involving the right upper lung from a prior wedge resection.  There was no findings for recurrent tumor, mediastinal/hilar adenopathy or pulmonary metastatic disease.  There was a stable 7 mm superior segment right lower lobe pulmonary nodule.  The left lower lobe 6 mm pulmonary nodule appeared slightly more solid and nodular when compared to prior examination.  CEA has been followed: 4.7 on 10/01/2014, 4.5 on 07/31/2015, and 3.9 on 05/03/2016.  Bilateral mammogram on 07/21/2015 revealed no evidence of malignancy.  She had a UTI which caused confusion.  She has recently had shingles.  Symptomatically, she has low back pain secondary to disk issues. Exam is stable.   Plan: 1.  Labs today:  CBC with diff, CMP, CEA. 2.  Review  interval chest CT.  Discuss RLL nodule (stable) and LLL nodule (slightly more solid and nodular).  Discuss ongoing surveillance. 3.  Chest CT without contrast on 08/02/2016- f/u LLL nodule. 4.  RTC after CT scan.   Lequita Asal, MD  05/03/2016, 11:57 AM

## 2016-05-03 NOTE — Progress Notes (Signed)
Patient is just getting over shingles.  She is also getting epidurals for back pain.  Patient here today for CT results.

## 2016-05-04 LAB — CEA: CEA: 3.9 ng/mL (ref 0.0–4.7)

## 2016-05-05 DIAGNOSIS — M5416 Radiculopathy, lumbar region: Secondary | ICD-10-CM | POA: Diagnosis not present

## 2016-05-05 DIAGNOSIS — M5126 Other intervertebral disc displacement, lumbar region: Secondary | ICD-10-CM | POA: Diagnosis not present

## 2016-05-12 ENCOUNTER — Ambulatory Visit: Payer: PPO

## 2016-05-14 ENCOUNTER — Ambulatory Visit: Payer: PPO | Admitting: Hematology and Oncology

## 2016-05-24 DIAGNOSIS — J45902 Unspecified asthma with status asthmaticus: Secondary | ICD-10-CM | POA: Diagnosis not present

## 2016-05-24 DIAGNOSIS — J40 Bronchitis, not specified as acute or chronic: Secondary | ICD-10-CM | POA: Diagnosis not present

## 2016-06-02 ENCOUNTER — Encounter: Payer: Self-pay | Admitting: Hematology and Oncology

## 2016-07-01 DIAGNOSIS — E782 Mixed hyperlipidemia: Secondary | ICD-10-CM | POA: Diagnosis not present

## 2016-07-01 DIAGNOSIS — I471 Supraventricular tachycardia: Secondary | ICD-10-CM | POA: Diagnosis not present

## 2016-07-02 ENCOUNTER — Other Ambulatory Visit: Payer: Self-pay | Admitting: Internal Medicine

## 2016-07-02 DIAGNOSIS — Z1231 Encounter for screening mammogram for malignant neoplasm of breast: Secondary | ICD-10-CM

## 2016-07-08 DIAGNOSIS — Z0000 Encounter for general adult medical examination without abnormal findings: Secondary | ICD-10-CM

## 2016-07-08 DIAGNOSIS — I471 Supraventricular tachycardia: Secondary | ICD-10-CM | POA: Diagnosis not present

## 2016-07-08 DIAGNOSIS — E782 Mixed hyperlipidemia: Secondary | ICD-10-CM | POA: Diagnosis not present

## 2016-07-08 DIAGNOSIS — Z79899 Other long term (current) drug therapy: Secondary | ICD-10-CM | POA: Diagnosis not present

## 2016-07-08 HISTORY — DX: Encounter for general adult medical examination without abnormal findings: Z00.00

## 2016-07-22 ENCOUNTER — Ambulatory Visit
Admission: RE | Admit: 2016-07-22 | Discharge: 2016-07-22 | Disposition: A | Discharge status: Home / Self Care | Payer: PPO | Source: Ambulatory Visit | Attending: Internal Medicine | Admitting: Internal Medicine

## 2016-07-22 DIAGNOSIS — Z1231 Encounter for screening mammogram for malignant neoplasm of breast: Secondary | ICD-10-CM | POA: Diagnosis not present

## 2016-08-02 ENCOUNTER — Ambulatory Visit
Admission: RE | Admit: 2016-08-02 | Discharge: 2016-08-02 | Disposition: A | Discharge status: Home / Self Care | Payer: PPO | Source: Ambulatory Visit | Attending: Hematology and Oncology | Admitting: Hematology and Oncology

## 2016-08-02 DIAGNOSIS — R911 Solitary pulmonary nodule: Secondary | ICD-10-CM | POA: Diagnosis not present

## 2016-08-02 DIAGNOSIS — C3411 Malignant neoplasm of upper lobe, right bronchus or lung: Secondary | ICD-10-CM | POA: Diagnosis not present

## 2016-08-02 DIAGNOSIS — I700 Atherosclerosis of aorta: Secondary | ICD-10-CM | POA: Diagnosis not present

## 2016-08-02 DIAGNOSIS — J439 Emphysema, unspecified: Secondary | ICD-10-CM | POA: Diagnosis not present

## 2016-08-02 DIAGNOSIS — C3491 Malignant neoplasm of unspecified part of right bronchus or lung: Secondary | ICD-10-CM | POA: Diagnosis not present

## 2016-08-03 ENCOUNTER — Inpatient Hospital Stay: Payer: PPO | Attending: Hematology and Oncology | Admitting: Hematology and Oncology

## 2016-08-03 ENCOUNTER — Encounter: Payer: Self-pay | Admitting: Hematology and Oncology

## 2016-08-03 VITALS — BP 143/86 | HR 61 | Temp 97.2°F | Resp 18 | Wt 145.2 lb

## 2016-08-03 DIAGNOSIS — I6529 Occlusion and stenosis of unspecified carotid artery: Secondary | ICD-10-CM

## 2016-08-03 DIAGNOSIS — Z8673 Personal history of transient ischemic attack (TIA), and cerebral infarction without residual deficits: Secondary | ICD-10-CM | POA: Diagnosis not present

## 2016-08-03 DIAGNOSIS — K449 Diaphragmatic hernia without obstruction or gangrene: Secondary | ICD-10-CM | POA: Insufficient documentation

## 2016-08-03 DIAGNOSIS — Z86010 Personal history of colonic polyps: Secondary | ICD-10-CM | POA: Insufficient documentation

## 2016-08-03 DIAGNOSIS — R000 Tachycardia, unspecified: Secondary | ICD-10-CM | POA: Diagnosis not present

## 2016-08-03 DIAGNOSIS — I10 Essential (primary) hypertension: Secondary | ICD-10-CM | POA: Diagnosis not present

## 2016-08-03 DIAGNOSIS — Z8619 Personal history of other infectious and parasitic diseases: Secondary | ICD-10-CM

## 2016-08-03 DIAGNOSIS — M545 Low back pain: Secondary | ICD-10-CM | POA: Insufficient documentation

## 2016-08-03 DIAGNOSIS — R918 Other nonspecific abnormal finding of lung field: Secondary | ICD-10-CM | POA: Diagnosis not present

## 2016-08-03 DIAGNOSIS — Z87891 Personal history of nicotine dependence: Secondary | ICD-10-CM | POA: Diagnosis not present

## 2016-08-03 DIAGNOSIS — Z87440 Personal history of urinary (tract) infections: Secondary | ICD-10-CM | POA: Insufficient documentation

## 2016-08-03 DIAGNOSIS — K219 Gastro-esophageal reflux disease without esophagitis: Secondary | ICD-10-CM

## 2016-08-03 DIAGNOSIS — C3411 Malignant neoplasm of upper lobe, right bronchus or lung: Secondary | ICD-10-CM | POA: Diagnosis not present

## 2016-08-03 DIAGNOSIS — Z79899 Other long term (current) drug therapy: Secondary | ICD-10-CM

## 2016-08-03 NOTE — Progress Notes (Signed)
Patient offers no complaints today. 

## 2016-08-03 NOTE — Progress Notes (Signed)
[facility] day:  08/03/2016  Chief Complaint: Heather [name] is a 81 y.o. 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  HPI: The patient was last seen in the medical oncology clinic on 05/03/2016.  At that time, she was recovering from shingles.  She had low back pain secondary to disk issues. Exam was stable.  Chest CT revealed a slightly more solid and nodular left lower lobe pulmonary nodule.  We discussed short term follow-up imaging.  Chest CT on 08/02/2016 revealed stable surgical changes right lung.  There was a stable 7 mm right retro hilar nodule.  The 7 mm posterior left lower lobe nodule was not substantially changed from 6 mm on the prior study. When comparing back to older exam of 10/28/2015, this was more clearly progressed over that timeframe.  Continued close attention on follow-up imaging was recommended.  During the interim, patient reports that she is doing "ok". Back pain continues; chronic in nature due to known lumbosacral disc issues.    Past Medical History:  Diagnosis Date  . Brain aneurysm   . Cancer ([place]) lung   2016  . Carotid artery stenosis   . Colonic polyp   . GERD (gastroesophageal reflux disease)   . Hiatal hernia   . Hypercholesteremia   . Hypertension   . Lung nodule 06/21/2014  . Plantar fasciitis   . Tachycardia, paroxysmal ([place]) 03/31/14    Past Surgical History:  Procedure Laterality Date  . BREAST EXCISIONAL BIOPSY Right 40 yrs ago   neg  . BREAST LUMPECTOMY     benign  . CERVIX LESION DESTRUCTION    . COLONOSCOPY WITH PROPOFOL N/A 03/31/2015   Procedure: COLONOSCOPY WITH PROPOFOL;  Surgeon: Manya Silvas, MD;  Location: [facility] ENDOSCOPY;  Service: Endoscopy;  Laterality: N/A;  . TUBAL LIGATION      Family History  Problem Relation Age of Onset  . Alcohol abuse Mother   . Heart attack Father   . Breast cancer Neg  Hx     Social History:  reports that she quit smoking about 28 years ago. She quit after 35.00 years of use. She has never used smokeless tobacco. She reports that she drinks alcohol. She reports that she does not use drugs.  The patient presents to the clinic with her husband today.  Allergies:  Allergies  Allergen Reactions  . Contrast Media [Iodinated Diagnostic Agents]   . Levofloxacin Itching and Swelling    Current Medications: Current Outpatient Prescriptions  Medication Sig Dispense Refill  . ALPRAZolam (XANAX) 0.25 MG tablet     . atenolol (TENORMIN) 50 MG tablet Take 50 mg by mouth at bedtime.    . cetirizine (ZYRTEC) 10 MG tablet Take 10 mg by mouth daily.    . Cholecalciferol 1000 UNITS tablet Take 1,000 Units by mouth daily.    . Coenzyme Q10 (CO Q 10) 100 MG CAPS Take 1 capsule by mouth daily.    . fluticasone (FLONASE) 50 MCG/ACT nasal spray Place into the nose.    . hydrochlorothiazide (HYDRODIURIL) 25 MG tablet Take 25 mg by mouth daily.    Marland Kitchen lisinopril-hydrochlorothiazide (PRINZIDE,ZESTORETIC) 20-12.5 MG per tablet Take 1 tablet by mouth daily.    Marland Kitchen RESVERATROL PO Take 1 capsule by mouth daily.    . simvastatin (ZOCOR) 20 MG tablet Take 20 mg by mouth daily.    Marland Kitchen donepezil (ARICEPT)  5 MG tablet Take 5 mg by mouth daily.     No current facility-administered medications for this visit.    Facility-Administered Medications Ordered in Other Visits  Medication Dose Route Frequency Provider Last Rate Last Dose  . 0.9 %  sodium chloride infusion   Intravenous Continuous Manya Silvas, MD        Review of Systems:  GENERAL:  Feels "good".  No fevers or sweats.  Weight down 1 pound since last visit. PERFORMANCE STATUS (ECOG):  0 HEENT:  No visual changes, runny nose, sore throat, mouth sores or tenderness. Lungs:  No shortness of breath or cough.  No hemoptysis. Cardiac:  No chest pain, palpitations, orthopnea, or PND. GI:  No nausea, vomiting, diarrhea,  constipation, melena or hematochezia. Interval colonoscopy. GU:  No urgency, frequency, dysuria, or hematuria.  Interval UTI. Musculoskeletal:  Low back pain due to ruptured disk (see HPI).  No joint pain.  No muscle tenderness. Extremities:  No pain or swelling. Skin:  No rashes or skin changes. Neuro: No headache, numbness or weakness, balance or coordination issues. Endocrine:  No diabetes, thyroid issues, hot flashes or night sweats. Psych:  No mood changes, depression or anxiety. Pain:  No focal pain. Review of systems:  All other systems reviewed and found to be negative.  Physical Exam: Blood pressure (!) 143/86, pulse 61, temperature (!) 97.2 F (36.2 C), temperature source Tympanic, resp. rate 18, weight 145 lb 3 oz (65.9 kg), SpO2 97 %. GENERAL:  Well developed, well nourished, woman sitting comfortably in the exam room in no acute distress. MENTAL STATUS:  Alert and oriented to person, place and time. HEAD:  [place] styled hair.  Normocephalic, atraumatic, face symmetric, no Cushingoid features. EYES:  Red glasses.  Brown eyes.  Arcus.  Pupils equal round and reactive to light and accomodation.  No conjunctivitis or scleral icterus. ENT:  Oropharynx clear without lesion.  Tongue normal. Mucous membranes moist.  RESPIRATORY:  Clear to auscultation without rales, wheezes or rhonchi. CARDIOVASCULAR:  Regular rate and rhythm without murmur, rub or gallop. ABDOMEN:  Soft, non-tender, with active bowel sounds, and no hepatosplenomegaly.  No masses. SKIN:  No rashes, ulcers or lesions. EXTREMITIES: No edema, no skin discoloration or tenderness.  No palpable cords. LYMPH NODES: No palpable cervical, supraclavicular, axillary or inguinal adenopathy  NEUROLOGICAL: Unremarkable. PSYCH:  Appropriate.   No visits with results within 3 Day(s) from this visit.  Latest known visit with results is:  Appointment on 05/03/2016  Component Date Value Ref Range Status  . WBC 05/03/2016 5.2  3.6  - 11.0 K/uL Final  . RBC 05/03/2016 4.20  3.80 - 5.20 MIL/uL Final  . Hemoglobin 05/03/2016 14.3  12.0 - 16.0 g/dL Final  . HCT 05/03/2016 41.2  35.0 - 47.0 % Final  . MCV 05/03/2016 98.2  80.0 - 100.0 fL Final  . MCH 05/03/2016 34.0  26.0 - 34.0 pg Final  . MCHC 05/03/2016 34.6  32.0 - 36.0 g/dL Final  . RDW 05/03/2016 13.3  11.5 - 14.5 % Final  . Platelets 05/03/2016 153  150 - 440 K/uL Final  . Neutrophils Relative % 05/03/2016 48  % Final  . Neutro Abs 05/03/2016 2.5  1.4 - 6.5 K/uL Final  . Lymphocytes Relative 05/03/2016 40  % Final  . Lymphs Abs 05/03/2016 2.1  1.0 - 3.6 K/uL Final  . Monocytes Relative 05/03/2016 8  % Final  . Monocytes Absolute 05/03/2016 0.4  0.2 - 0.9 K/uL Final  .  Eosinophils Relative 05/03/2016 3  % Final  . Eosinophils Absolute 05/03/2016 0.1  0 - 0.7 K/uL Final  . Basophils Relative 05/03/2016 1  % Final  . Basophils Absolute 05/03/2016 0.0  0 - 0.1 K/uL Final  . Sodium 05/03/2016 139  135 - 145 mmol/L Final  . Potassium 05/03/2016 4.1  3.5 - 5.1 mmol/L Final  . Chloride 05/03/2016 107  101 - 111 mmol/L Final  . CO2 05/03/2016 25  22 - 32 mmol/L Final  . Glucose, Bld 05/03/2016 122* 65 - 99 mg/dL Final  . BUN 05/03/2016 25* 6 - 20 mg/dL Final  . Creatinine, Ser 05/03/2016 0.71  0.44 - 1.00 mg/dL Final  . Calcium 05/03/2016 9.2  8.9 - 10.3 mg/dL Final  . Total Protein 05/03/2016 6.6  6.5 - 8.1 g/dL Final  . Albumin 05/03/2016 4.0  3.5 - 5.0 g/dL Final  . AST 05/03/2016 25  15 - 41 U/L Final  . ALT 05/03/2016 19  14 - 54 U/L Final  . Alkaline Phosphatase 05/03/2016 48  38 - 126 U/L Final  . Total Bilirubin 05/03/2016 0.5  0.3 - 1.2 mg/dL Final  . GFR calc non Af Amer 05/03/2016 >60  >60 mL/min Final  . GFR calc Af Amer 05/03/2016 >60  >60 mL/min Final   Comment: (NOTE) The eGFR has been calculated using the CKD EPI equation. This calculation has not been validated in all clinical situations. eGFR's persistently <60 mL/min signify possible Chronic  Kidney Disease.   . Anion gap 05/03/2016 7  5 - 15 Final  . CEA 05/03/2016 3.9  0.0 - 4.7 ng/mL Final   Comment: (NOTE)       Roche ECLIA methodology       Nonsmokers  <3.9                                     Smokers     <5.6 Performed At: [facility] [address] [place], [place] [id number] Lindon Romp MD YW:[id number]     Assessment:  Heather [name] is a 81 y.o. 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 Pathology revealed a 1.1 cm high-grade adenocarcinoma with pleural invasion. Nodes were negative. Pathologic stage was [id number] (stage IB).  PET scan on 02/13/2014 revealed a 1 cm right upper lobe hypermetabolic nodule (SUV 3.8) and a 6 mm right upper lobe nodule (no metabolic activity). There was no mediastianl adenopathy.   Chest CT on 09/27/2014 revealed images consistent with right upper lobe wedge resection.  There was slight pleural parenchymal nodular thickening and mild distortion consistent with post operative scarring.  Chest CT on 04/28/2015 revealed stable postoperative findings and scarring in the right lung.  There was slight nodularity along the wedge resection site.  There was a 7 x 6 mm right upper lobe nodule (unchanged from 02/05/2014).   Chest CT on 10/28/2015 revealed no evidence of lung cancer recurrence following right upper lobe wedge resection.  There was stable 7 mm perihilar pulmonary nodule in the right upper lobe.  Chest CT on 04/30/2016 revealed stable surgical changes involving the right upper lung from a prior wedge resection.  There was no findings for recurrent tumor, mediastinal/hilar adenopathy or pulmonary metastatic disease.  There was a stable 7 mm superior segment right lower lobe pulmonary nodule.  The left lower lobe 6 mm pulmonary nodule appeared  slightly more solid and nodular when compared to prior examination.  Chest CT on 08/02/2016 revealed stable surgical changes right  lung and a stable 7 mm right retro hilar nodule.  The 7 mm posterior left lower lobe nodule was not substantially changed from 6 mm on the prior study. When comparing back to older exam of 10/28/2015, this was more clearly progressed over that timeframe.  CEA has been followed: 4.7 on 10/01/2014, 4.5 on 07/31/2015, and 3.9 on 05/03/2016.  Bilateral mammogram on 07/21/2015 revealed no evidence of malignancy.  She had a UTI which caused confusion.  She has recently had shingles.  Symptomatically, she has low back pain secondary to disk issues. Exam is stable.   Plan: 1.  Review interval chest CT.  Discuss small left lower lobe pulmonary nodule. Discuss reimaging in 3-6 months. 2.  CT chest in 6 months (02/02/2017) 3.  RTC in 6 months for MD assessment, labs (CBC with diff, CMP, CEA), and review of chest CT scan.   Lequita Asal, MD  08/03/2016

## 2016-08-17 DIAGNOSIS — G8929 Other chronic pain: Secondary | ICD-10-CM | POA: Diagnosis not present

## 2016-08-17 DIAGNOSIS — M545 Low back pain: Secondary | ICD-10-CM | POA: Diagnosis not present

## 2016-08-19 DIAGNOSIS — H348322 Tributary (branch) retinal vein occlusion, left eye, stable: Secondary | ICD-10-CM | POA: Diagnosis not present

## 2016-08-20 DIAGNOSIS — H348322 Tributary (branch) retinal vein occlusion, left eye, stable: Secondary | ICD-10-CM | POA: Diagnosis not present

## 2016-08-26 DIAGNOSIS — I10 Essential (primary) hypertension: Secondary | ICD-10-CM | POA: Diagnosis not present

## 2016-08-30 IMAGING — MG MM DIGITAL SCREENING BILAT W/ TOMO W/ CAD
9 of 12 series · 9 of 28 positions shown · non-contrast
Comparison: Previous exam(s).

CLINICAL DATA: Screening.

EXAM:
2D DIGITAL SCREENING BILATERAL MAMMOGRAM WITH CAD AND ADJUNCT TOMO

[R MLO]
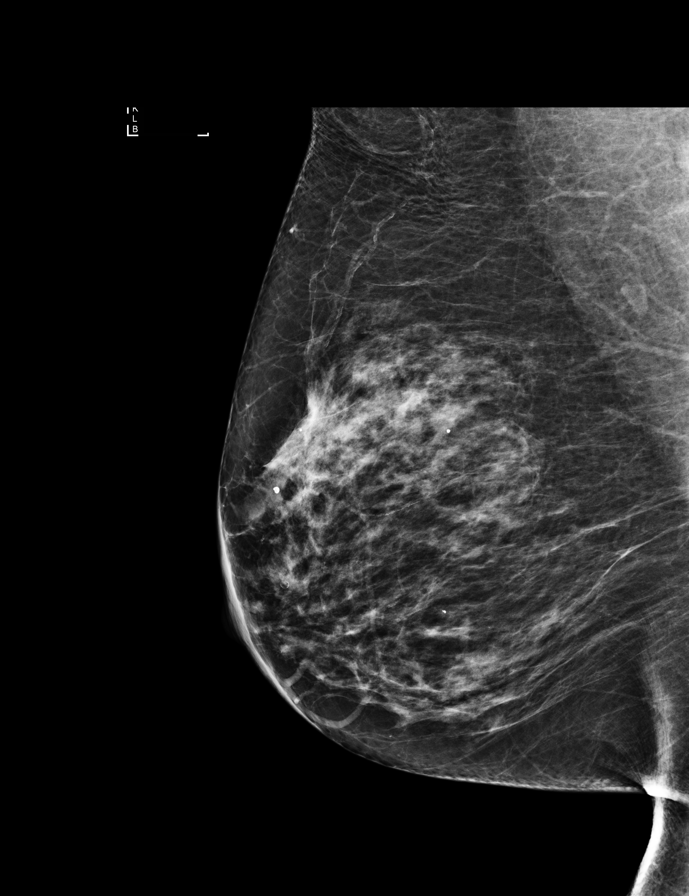

[L MLO]
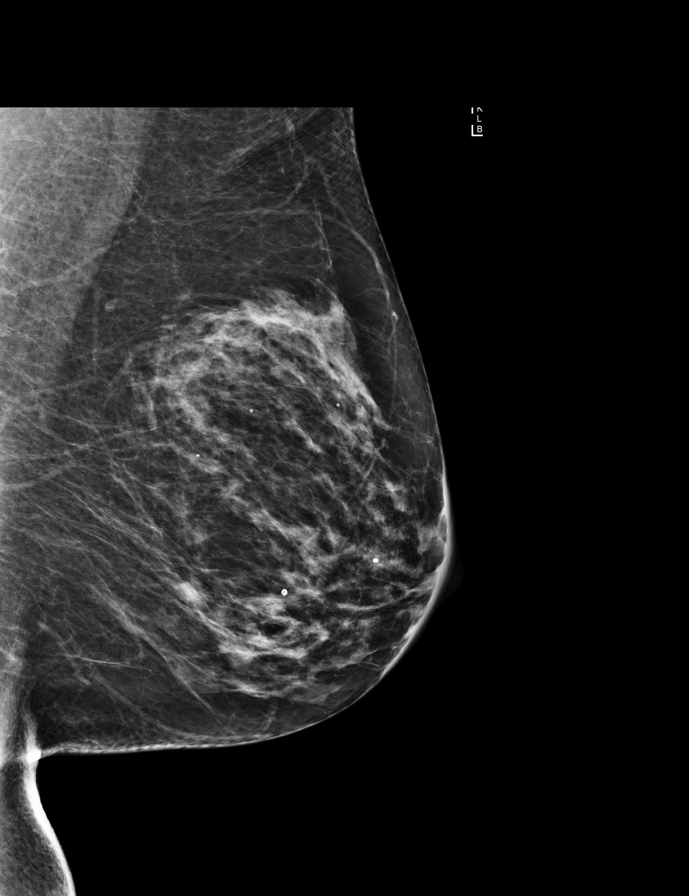

[L CC]
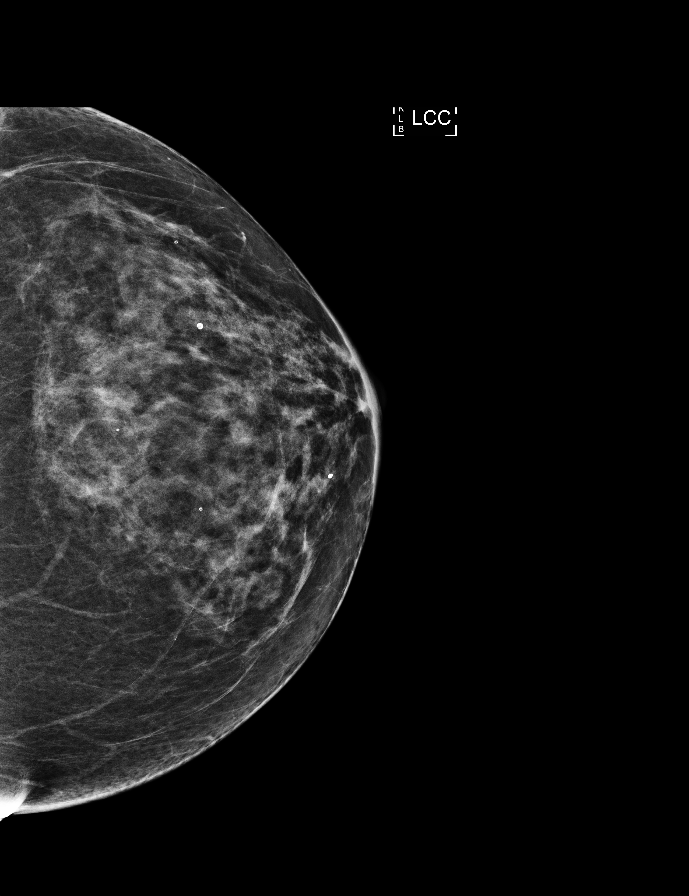

[R MLO synth-2D]
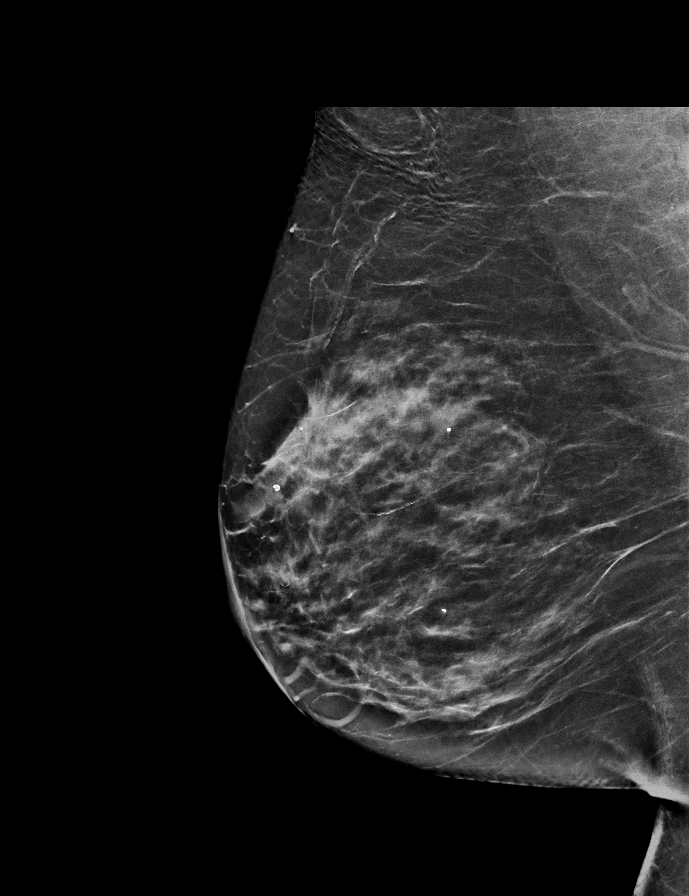

[L MLO synth-2D]
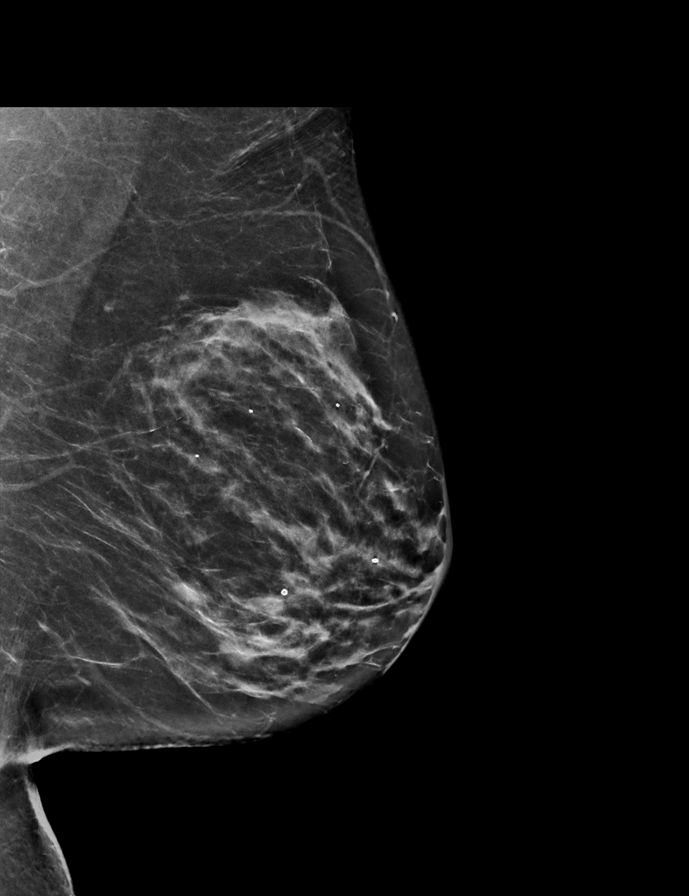

[L CC synth-2D]
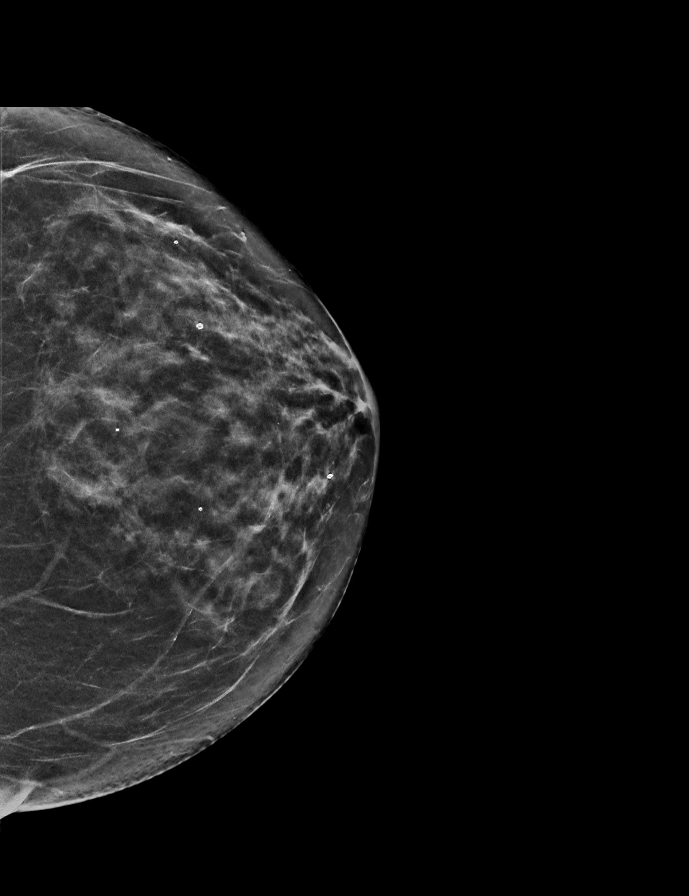

[R CC]
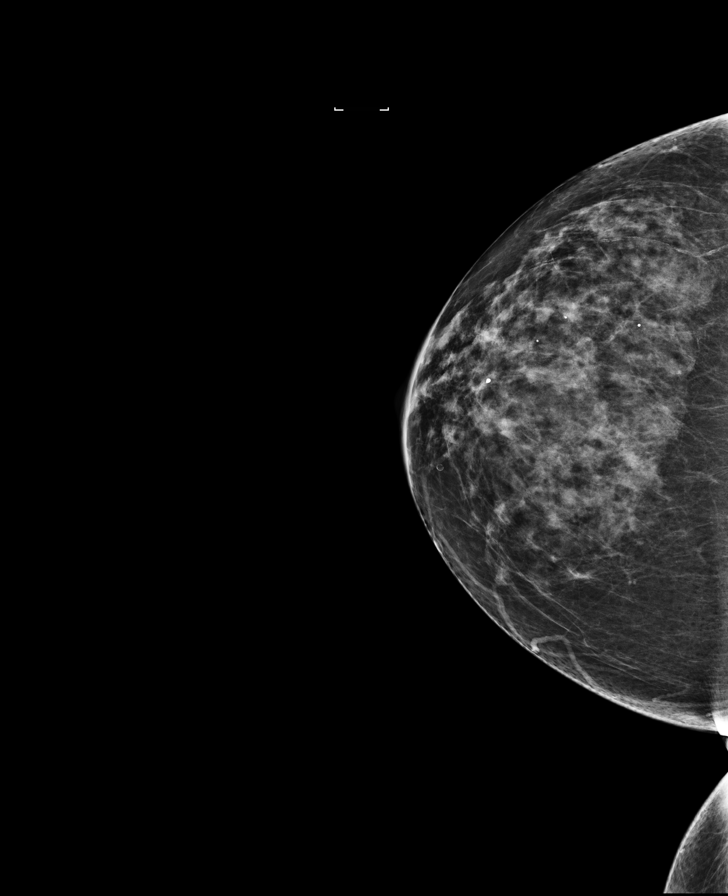

[R CC synth-2D]
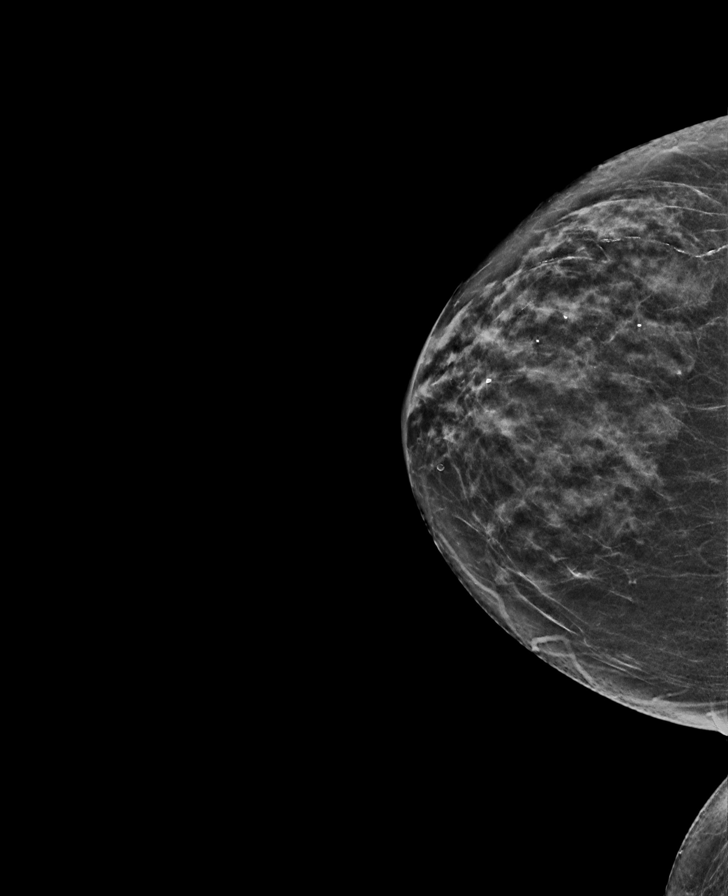

[L CC tomo · tomo slice 31/61.0]
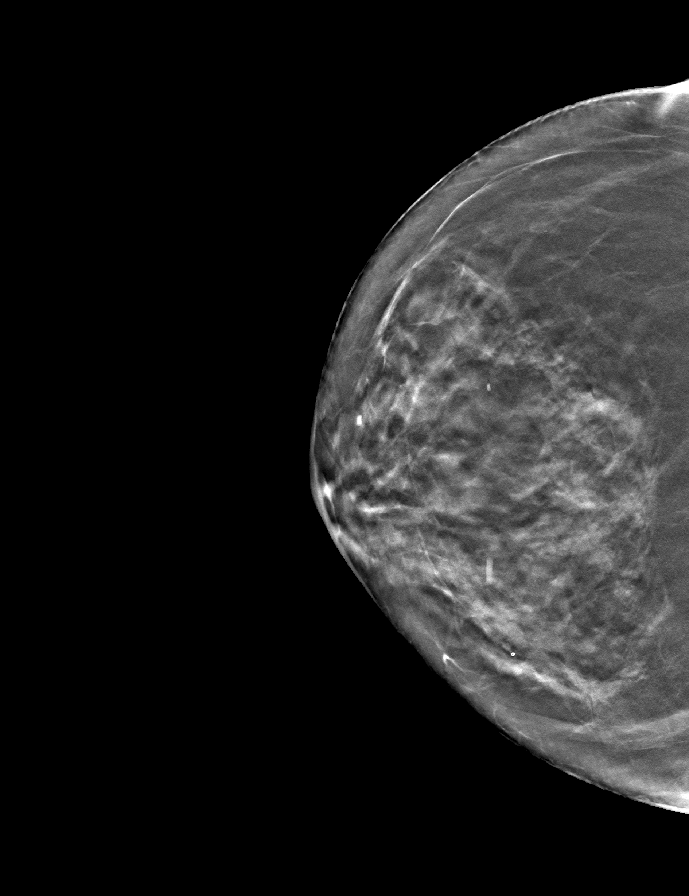

[9 of 28 positions shown; findings below may reference images not displayed]

ACR Breast Density Category c: The breast tissue is heterogeneously
dense, which may obscure small masses.
FINDINGS: There are no findings suspicious for malignancy. Images were
processed with CAD.
IMPRESSION: No mammographic evidence of malignancy. A result letter of this
screening mammogram will be mailed directly to the patient.

RECOMMENDATION:
Screening mammogram in one year. (Code:TN-[id number])

BI-RADS CATEGORY  1: Negative.

## 2016-09-17 DIAGNOSIS — J019 Acute sinusitis, unspecified: Secondary | ICD-10-CM | POA: Diagnosis not present

## 2016-09-23 DIAGNOSIS — T781XXA Other adverse food reactions, not elsewhere classified, initial encounter: Secondary | ICD-10-CM | POA: Diagnosis not present

## 2016-12-31 DIAGNOSIS — E782 Mixed hyperlipidemia: Secondary | ICD-10-CM | POA: Diagnosis not present

## 2016-12-31 DIAGNOSIS — Z79899 Other long term (current) drug therapy: Secondary | ICD-10-CM | POA: Diagnosis not present

## 2017-01-05 DIAGNOSIS — E782 Mixed hyperlipidemia: Secondary | ICD-10-CM | POA: Diagnosis not present

## 2017-01-05 DIAGNOSIS — I471 Supraventricular tachycardia: Secondary | ICD-10-CM | POA: Diagnosis not present

## 2017-01-05 DIAGNOSIS — Z79899 Other long term (current) drug therapy: Secondary | ICD-10-CM | POA: Diagnosis not present

## 2017-02-02 ENCOUNTER — Ambulatory Visit: Payer: Medicare HMO

## 2017-02-02 ENCOUNTER — Ambulatory Visit: Payer: PPO

## 2017-02-04 ENCOUNTER — Ambulatory Visit: Payer: PPO | Admitting: Hematology and Oncology

## 2017-02-04 ENCOUNTER — Other Ambulatory Visit: Payer: PPO

## 2017-02-10 ENCOUNTER — Ambulatory Visit: Admission: RE | Admit: 2017-02-10 | Payer: Medicare HMO | Source: Ambulatory Visit

## 2017-02-10 ENCOUNTER — Ambulatory Visit
Admission: RE | Admit: 2017-02-10 | Discharge: 2017-02-10 | Disposition: A | Discharge status: Home / Self Care | Payer: Medicare HMO | Source: Ambulatory Visit | Attending: Hematology and Oncology | Admitting: Hematology and Oncology

## 2017-02-10 DIAGNOSIS — K5730 Diverticulosis of large intestine without perforation or abscess without bleeding: Secondary | ICD-10-CM | POA: Insufficient documentation

## 2017-02-10 DIAGNOSIS — R918 Other nonspecific abnormal finding of lung field: Secondary | ICD-10-CM | POA: Insufficient documentation

## 2017-02-10 DIAGNOSIS — C3411 Malignant neoplasm of upper lobe, right bronchus or lung: Secondary | ICD-10-CM | POA: Insufficient documentation

## 2017-02-10 DIAGNOSIS — J432 Centrilobular emphysema: Secondary | ICD-10-CM | POA: Insufficient documentation

## 2017-02-10 DIAGNOSIS — I700 Atherosclerosis of aorta: Secondary | ICD-10-CM | POA: Diagnosis not present

## 2017-02-10 DIAGNOSIS — K7689 Other specified diseases of liver: Secondary | ICD-10-CM | POA: Diagnosis not present

## 2017-02-10 DIAGNOSIS — K769 Liver disease, unspecified: Secondary | ICD-10-CM | POA: Diagnosis not present

## 2017-02-10 DIAGNOSIS — N1330 Unspecified hydronephrosis: Secondary | ICD-10-CM | POA: Diagnosis not present

## 2017-02-11 ENCOUNTER — Telehealth: Payer: Self-pay | Admitting: *Deleted

## 2017-02-11 ENCOUNTER — Inpatient Hospital Stay: Payer: Medicare HMO | Admitting: Hematology and Oncology

## 2017-02-11 ENCOUNTER — Inpatient Hospital Stay: Payer: Medicare HMO | Attending: Hematology and Oncology

## 2017-02-11 VITALS — BP 138/80 | HR 58 | Temp 97.8°F | Resp 16 | Wt 145.0 lb

## 2017-02-11 DIAGNOSIS — E7800 Pure hypercholesterolemia, unspecified: Secondary | ICD-10-CM

## 2017-02-11 DIAGNOSIS — R079 Chest pain, unspecified: Secondary | ICD-10-CM | POA: Diagnosis not present

## 2017-02-11 DIAGNOSIS — I6529 Occlusion and stenosis of unspecified carotid artery: Secondary | ICD-10-CM

## 2017-02-11 DIAGNOSIS — I479 Paroxysmal tachycardia, unspecified: Secondary | ICD-10-CM | POA: Diagnosis not present

## 2017-02-11 DIAGNOSIS — Z8673 Personal history of transient ischemic attack (TIA), and cerebral infarction without residual deficits: Secondary | ICD-10-CM | POA: Diagnosis not present

## 2017-02-11 DIAGNOSIS — R918 Other nonspecific abnormal finding of lung field: Secondary | ICD-10-CM | POA: Diagnosis not present

## 2017-02-11 DIAGNOSIS — Z87891 Personal history of nicotine dependence: Secondary | ICD-10-CM

## 2017-02-11 DIAGNOSIS — M722 Plantar fascial fibromatosis: Secondary | ICD-10-CM

## 2017-02-11 DIAGNOSIS — C3411 Malignant neoplasm of upper lobe, right bronchus or lung: Secondary | ICD-10-CM

## 2017-02-11 DIAGNOSIS — Z79899 Other long term (current) drug therapy: Secondary | ICD-10-CM | POA: Insufficient documentation

## 2017-02-11 DIAGNOSIS — Z86010 Personal history of colonic polyps: Secondary | ICD-10-CM | POA: Diagnosis not present

## 2017-02-11 DIAGNOSIS — K219 Gastro-esophageal reflux disease without esophagitis: Secondary | ICD-10-CM

## 2017-02-11 DIAGNOSIS — M545 Low back pain: Secondary | ICD-10-CM | POA: Diagnosis not present

## 2017-02-11 DIAGNOSIS — I10 Essential (primary) hypertension: Secondary | ICD-10-CM | POA: Diagnosis not present

## 2017-02-11 DIAGNOSIS — K449 Diaphragmatic hernia without obstruction or gangrene: Secondary | ICD-10-CM | POA: Insufficient documentation

## 2017-02-11 DIAGNOSIS — J45909 Unspecified asthma, uncomplicated: Secondary | ICD-10-CM

## 2017-02-11 DIAGNOSIS — R911 Solitary pulmonary nodule: Secondary | ICD-10-CM

## 2017-02-11 LAB — CBC WITH DIFFERENTIAL/PLATELET
Basophils Absolute: 0 10*3/uL (ref 0–0.1)
Basophils Relative: 0 %
Eosinophils Absolute: 0.1 10*3/uL (ref 0–0.7)
Eosinophils Relative: 1 %
HCT: 42 % (ref 35.0–47.0)
Hemoglobin: 14.1 g/dL (ref 12.0–16.0)
Lymphocytes Relative: 27 %
Lymphs Abs: 1.9 10*3/uL (ref 1.0–3.6)
MCH: 34.1 pg — ABNORMAL HIGH (ref 26.0–34.0)
MCHC: 33.6 g/dL (ref 32.0–36.0)
MCV: 101.4 fL — ABNORMAL HIGH (ref 80.0–100.0)
Monocytes Absolute: 0.6 10*3/uL (ref 0.2–0.9)
Monocytes Relative: 8 %
Neutro Abs: 4.6 10*3/uL (ref 1.4–6.5)
Neutrophils Relative %: 64 %
Platelets: 197 10*3/uL (ref 150–440)
RBC: 4.14 MIL/uL (ref 3.80–5.20)
RDW: 13.2 % (ref 11.5–14.5)
WBC: 7.2 10*3/uL (ref 3.6–11.0)

## 2017-02-11 LAB — COMPREHENSIVE METABOLIC PANEL
ALT: 19 U/L (ref 14–54)
AST: 28 U/L (ref 15–41)
Albumin: 4.1 g/dL (ref 3.5–5.0)
Alkaline Phosphatase: 55 U/L (ref 38–126)
Anion gap: 8 (ref 5–15)
BUN: 22 mg/dL — ABNORMAL HIGH (ref 6–20)
CO2: 28 mmol/L (ref 22–32)
Calcium: 9.4 mg/dL (ref 8.9–10.3)
Chloride: 102 mmol/L (ref 101–111)
Creatinine, Ser: 0.78 mg/dL (ref 0.44–1.00)
GFR calc Af Amer: >60 mL/min (ref >60)
GFR calc non Af Amer: >60 mL/min (ref >60)
Glucose, Bld: 143 mg/dL — ABNORMAL HIGH (ref 65–99)
Potassium: 4.2 mmol/L (ref 3.5–5.1)
Sodium: 138 mmol/L (ref 135–145)
Total Bilirubin: 0.5 mg/dL (ref 0.3–1.2)
Total Protein: 6.9 g/dL (ref 6.5–8.1)

## 2017-02-11 NOTE — Progress Notes (Signed)
[facility] day:  02/11/2017   Chief Complaint: Heather [name] is a 82 y.o. 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  HPI: The patient was last seen in the medical oncology clinic on 08/03/2016.  At that time, she had low back pain secondary to disk issues. Exam was stable. Chest CT on 08/02/2016 revealed stable surgical changes right lung.  There was a stable 7 mm right retro hilar nodule and a stable 7 mm posterior left lower lobe nodule. When comparing back to older exam of 10/28/2015, this was more clearly progressed over that timeframe.  Continued close attention on follow-up imaging was recommended.  Chest CT on 02/10/2017 revealed clear interval progression of the posterior left lower lobe pulmonary nodule, now measuring 12 mm in long axis and having a distinctly lobular contour. Imaging features were very concerning for neoplasm. Lesion position in the posterior lung base makes it susceptible to motion artifact on PET imaging, but at it's current size, PET imaging may well prove helpful to further evaluate.  There was no change in the 7 mm nodule posterior to the right hilum.  She was seen by Dr Emily Filbert on 02/07/2017.  She was noted to have asthmatic bronchitis-the cause of her dyspnea.  She received Kenalog 1.5 cc IM x1 and Ceftin 250 mg twice daily.  She had atypical chest pain-EKG shows normal sinus rhythm with nonspecific ST-T wave changes, no sign for coronary ischemia.  She had epigastric discomfort-because of her chest pain, consistent with viral induced gastritis, Prilosec OTC times 1 box.  She had follow-up with Dr. Emily Filbert on 02/10/2017.  She was noted to have facial flushing-a reaction to the cortisone injection, not an antibiotic allergy, symptoms were better, abdominal pain resolved, off antibiotics.  She was not felt  allergic to Ceftin nor Levaquin.  PAT-no recurrence, controlled  Symptomatically, denies any complaints.  She denies any respiratory symptoms.    Past Medical History:  Diagnosis Date  . Brain aneurysm   . Cancer ([place]) lung   2016  . Carotid artery stenosis   . Colonic polyp   . GERD (gastroesophageal reflux disease)   . Hiatal hernia   . Hypercholesteremia   . Hypertension   . Lung nodule 06/21/2014  . Plantar fasciitis   . Tachycardia, paroxysmal ([place]) 03/31/14    Past Surgical History:  Procedure Laterality Date  . BREAST EXCISIONAL BIOPSY Right 40 yrs ago   neg  . BREAST LUMPECTOMY     benign  . CERVIX LESION DESTRUCTION    . COLONOSCOPY WITH PROPOFOL N/A 03/31/2015   Procedure: COLONOSCOPY WITH PROPOFOL;  Surgeon: Manya Silvas, MD;  Location: [facility] ENDOSCOPY;  Service: Endoscopy;  Laterality: N/A;  . TUBAL LIGATION      Family History  Problem Relation Age of Onset  . Alcohol abuse Mother   . Heart attack Father   . Breast cancer Neg Hx     Social History:  reports that she quit smoking about 28 years ago. She quit after 35.00 years of use. she has never used smokeless tobacco. She reports that she drinks alcohol. She reports that she does not use drugs.  She lives in [place].  She is accompanied by her husband today.  Allergies:  Allergies  Allergen Reactions  . Contrast Media [Iodinated Diagnostic Agents]   . Cortisone  Patient had facial swelling and itching from cortisone injection IM    Current Medications: Current Outpatient Medications  Medication Sig Dispense Refill  . ALPRAZolam (XANAX) 0.25 MG tablet     . atenolol (TENORMIN) 50 MG tablet Take 50 mg by mouth at bedtime.    . cetirizine (ZYRTEC) 10 MG tablet Take 10 mg by mouth daily.    . Cholecalciferol 1000 UNITS tablet Take 1,000 Units by mouth daily.    . Coenzyme Q10 (CO Q 10) 100 MG CAPS Take 1 capsule by mouth daily.    Marland Kitchen donepezil (ARICEPT) 5 MG tablet Take 5 mg by mouth daily.    .  fluticasone (FLONASE) 50 MCG/ACT nasal spray Place into the nose.    . hydrochlorothiazide (HYDRODIURIL) 25 MG tablet Take 25 mg by mouth daily.    Marland Kitchen lisinopril-hydrochlorothiazide (PRINZIDE,ZESTORETIC) 20-12.5 MG per tablet Take 1 tablet by mouth daily.    Marland Kitchen RESVERATROL PO Take 1 capsule by mouth daily.    . simvastatin (ZOCOR) 20 MG tablet Take 20 mg by mouth daily.     No current facility-administered medications for this visit.    Facility-Administered Medications Ordered in Other Visits  Medication Dose Route Frequency Provider Last Rate Last Dose  . 0.9 %  sodium chloride infusion   Intravenous Continuous Manya Silvas, MD        Review of Systems:  GENERAL:  Feels "good".  No fevers or sweats.  Weight stable since last visit. PERFORMANCE STATUS (ECOG):  0 HEENT:  No visual changes, runny nose, sore throat, mouth sores or tenderness. Lungs:  No shortness of breath or cough.  No hemoptysis. Cardiac:  No chest pain, palpitations, orthopnea, or PND. GI:  No nausea, vomiting, diarrhea, constipation, melena or hematochezia. Interval colonoscopy. GU:  No urgency, frequency, dysuria, or hematuria.  Interval UTI. Musculoskeletal:  Low back pain due to ruptured disk.  No joint pain.  No muscle tenderness. Extremities:  No pain or swelling. Skin:  No rashes or skin changes. Neuro: No headache, numbness or weakness, balance or coordination issues. Endocrine:  No diabetes, thyroid issues, hot flashes or night sweats. Psych:  No mood changes, depression or anxiety. Pain:  No focal pain. Review of systems:  All other systems reviewed and found to be negative.  Physical Exam: Blood pressure 138/80, pulse (!) 58, temperature 97.8 F (36.6 C), temperature source Tympanic, resp. rate 16, weight 145 lb (65.8 kg). GENERAL:  Well developed, well nourished, woman sitting comfortably in the exam room in no acute distress. MENTAL STATUS:  Alert and oriented to person, place and time. HEAD:   Auburn styled hair.  Normocephalic, atraumatic, face symmetric, no Cushingoid features. EYES:  Red glasses.  Brown eyes.  Arcus.  Pupils equal round and reactive to light and accomodation.  No conjunctivitis or scleral icterus. ENT:  Oropharynx clear without lesion.  Tongue normal. Mucous membranes moist.  RESPIRATORY:  Clear to auscultation without rales, wheezes or rhonchi. CARDIOVASCULAR:  Regular rate and rhythm without murmur, rub or gallop. ABDOMEN:  Soft, non-tender, with active bowel sounds, and no hepatosplenomegaly.  No masses. SKIN:  No rashes, ulcers or lesions. EXTREMITIES: No edema, no skin discoloration or tenderness.  No palpable cords. LYMPH NODES: No palpable cervical, supraclavicular, axillary or inguinal adenopathy  NEUROLOGICAL: Unremarkable. PSYCH:  Appropriate.   Imaging studies: 02/13/2014:  PET scan revealed a 1 cm right upper lobe hypermetabolic nodule (SUV 3.8) and a 6 mm right upper lobe nodule (no metabolic activity). There was no  mediastianl adenopathy.  09/27/2014:  Chest CT revealed images consistent with right upper lobe wedge resection.  There was slight pleural parenchymal nodular thickening and mild distortion consistent with post operative scarring.   04/28/2015:  Chest CT revealed stable postoperative findings and scarring in the right lung.  There was slight nodularity along the wedge resection site.  There was a 7 x 6 mm right upper lobe nodule (unchanged from 02/05/2014).  10/28/2015:  Chest CT revealed no evidence of lung cancer recurrence following right upper lobe wedge resection.  There was stable 7 mm perihilar pulmonary nodule in the right upper lobe.   04/30/2016:  Chest CT revealed stable surgical changes involving the right upper lung from a prior wedge resection.  There was no findings for recurrent tumor, mediastinal/hilar adenopathy or pulmonary metastatic disease.  There was a stable 7 mm superior segment right lower lobe pulmonary nodule.  The  left lower lobe 6 mm pulmonary nodule appeared slightly more solid and nodular when compared to prior examination. 08/02/2016:  Chest CT revealed stable surgical changes right lung and a stable 7 mm right retro hilar nodule.  The 7 mm posterior left lower lobe nodule was not substantially changed from 6 mm on the prior study. When comparing back to older exam of 10/28/2015, this was more clearly progressed over that timeframe. 02/10/2017:  Chest CT revealed clear interval progression of the posterior left lower lobe pulmonary nodule, now measuring 12 mm in long axis and having a distinctly lobular contour. Imaging features were very concerning for neoplasm. Lesion position in the posterior lung base makes it susceptible to motion artifact on PET imaging, but at it's current size, PET imaging may well prove helpful to further evaluate.  There was no change in the 7 mm nodule posterior to the right hilum.   Appointment on 02/11/2017  Component Date Value Ref Range Status  . Sodium 02/11/2017 138  135 - 145 mmol/L Final  . Potassium 02/11/2017 4.2  3.5 - 5.1 mmol/L Final  . Chloride 02/11/2017 102  101 - 111 mmol/L Final  . CO2 02/11/2017 28  22 - 32 mmol/L Final  . Glucose, Bld 02/11/2017 143* 65 - 99 mg/dL Final  . BUN 02/11/2017 22* 6 - 20 mg/dL Final  . Creatinine, Ser 02/11/2017 0.78  0.44 - 1.00 mg/dL Final  . Calcium 02/11/2017 9.4  8.9 - 10.3 mg/dL Final  . Total Protein 02/11/2017 6.9  6.5 - 8.1 g/dL Final  . Albumin 02/11/2017 4.1  3.5 - 5.0 g/dL Final  . AST 02/11/2017 28  15 - 41 U/L Final  . ALT 02/11/2017 19  14 - 54 U/L Final  . Alkaline Phosphatase 02/11/2017 55  38 - 126 U/L Final  . Total Bilirubin 02/11/2017 0.5  0.3 - 1.2 mg/dL Final  . GFR calc non Af Amer 02/11/2017 >60  >60 mL/min Final  . GFR calc Af Amer 02/11/2017 >60  >60 mL/min Final   Comment: (NOTE) The eGFR has been calculated using the CKD EPI equation. This calculation has not been validated in all clinical  situations. eGFR's persistently <60 mL/min signify possible Chronic Kidney Disease.   Georgiann Hahn gap 02/11/2017 8  5 - 15 Final   Performed at [facility], [address] [place], [place] [address]  . CEA 02/11/2017 3.9  0.0 - 4.7 ng/mL Final   Comment: (NOTE)  Nonsmokers          <3.9                             Smokers             <5.6 [employer] Electrochemiluminescence Immunoassay (ECLIA) Values obtained with different assay methods or kits cannot be used interchangeably.  Results cannot be interpreted as absolute evidence of the presence or absence of malignant disease. Performed At: [facility] [place], [place] [id number] Rush Farmer MD JJ:[id number] Performed at [facility], [address], [place], [place] [address]   . WBC 02/11/2017 7.2  3.6 - 11.0 K/uL Final  . RBC 02/11/2017 4.14  3.80 - 5.20 MIL/uL Final  . Hemoglobin 02/11/2017 14.1  12.0 - 16.0 g/dL Final  . HCT 02/11/2017 42.0  35.0 - 47.0 % Final  . MCV 02/11/2017 101.4* 80.0 - 100.0 fL Final  . MCH 02/11/2017 34.1* 26.0 - 34.0 pg Final  . MCHC 02/11/2017 33.6  32.0 - 36.0 g/dL Final  . RDW 02/11/2017 13.2  11.5 - 14.5 % Final  . Platelets 02/11/2017 197  150 - 440 K/uL Final  . Neutrophils Relative % 02/11/2017 64  % Final  . Neutro Abs 02/11/2017 4.6  1.4 - 6.5 K/uL Final  . Lymphocytes Relative 02/11/2017 27  % Final  . Lymphs Abs 02/11/2017 1.9  1.0 - 3.6 K/uL Final  . Monocytes Relative 02/11/2017 8  % Final  . Monocytes Absolute 02/11/2017 0.6  0.2 - 0.9 K/uL Final  . Eosinophils Relative 02/11/2017 1  % Final  . Eosinophils Absolute 02/11/2017 0.1  0 - 0.7 K/uL Final  . Basophils Relative 02/11/2017 0  % Final  . Basophils Absolute 02/11/2017 0.0  0 - 0.1 K/uL Final   Performed at [facility], [address] [place], [place] [address]    Assessment:  Heather [name] is a 82 y.o. female with stage IB adenocarcinoma of  the right upper lobe status post wedge resection on 03/13/2014. Pathology revealed a 1.1 cm high-grade adenocarcinoma with pleural invasion. Nodes were negative. Pathologic stage was T2aN1M0 (stage IB).  PET scan on 02/13/2014 revealed a 1 cm right upper lobe hypermetabolic nodule (SUV 3.8) and a 6 mm right upper lobe nodule (no metabolic activity). There was no mediastianl adenopathy.   Chest CT on 02/10/2017 revealed clear interval progression of the posterior left lower lobe pulmonary nodule, now measuring 12 mm in long axis and having a distinctly lobular contour. Imaging features were very concerning for neoplasm. Lesion position in the posterior lung base makes it susceptible to motion artifact on PET imaging, but at it's current size, PET imaging may well prove helpful to further evaluate.  There was no change in the 7 mm nodule posterior to the right hilum.  CEA has been followed: 4.7 on 10/01/2014, 4.5 on 07/31/2015, 3.9 on 05/03/2016, and 3.9 on 02/11/2017.  Bilateral mammogram on 07/22/2016 revealed no evidence of malignancy.  She had a UTI which caused confusion.  She had shingles.  Symptomatically, she denies any complaints. Exam is stable.   Plan: 1.  Labs today:  CBC with diff, CMP, CEA. 2.  Review interval chest CT.  Discuss progression of posterior left lower lobe pulmonary nodule.  Discuss plan for PET scan and follow-up assessment by Dr. Genevive Bi. 3.  Schedule PET scan. 4.  Present at tumor board on 02/17/2017. 5.  RTC  in 1 week for MD assessment and discussion regarding direction of therapy.   Lequita Asal, MD  02/11/2017, 4:35 PM

## 2017-02-11 NOTE — Telephone Encounter (Signed)
Called to let patient know that she will not need any premedication for scan, per Dr. Mike Gip.  The scan will be just like the one she just had.  Also informed her that I had changed pharmacy in the system to [facility] in [place] per her request at checkout.

## 2017-02-12 ENCOUNTER — Encounter: Payer: Self-pay | Admitting: Hematology and Oncology

## 2017-02-12 LAB — CEA: CEA: 3.9 ng/mL (ref 0.0–4.7)

## 2017-02-17 ENCOUNTER — Inpatient Hospital Stay: Payer: Medicare HMO

## 2017-02-18 ENCOUNTER — Ambulatory Visit
Admission: RE | Admit: 2017-02-18 | Discharge: 2017-02-18 | Disposition: A | Discharge status: Home / Self Care | Payer: Medicare HMO | Source: Ambulatory Visit | Attending: Hematology and Oncology | Admitting: Hematology and Oncology

## 2017-02-18 DIAGNOSIS — C3411 Malignant neoplasm of upper lobe, right bronchus or lung: Secondary | ICD-10-CM | POA: Diagnosis not present

## 2017-02-18 DIAGNOSIS — Z902 Acquired absence of lung [part of]: Secondary | ICD-10-CM | POA: Diagnosis not present

## 2017-02-18 DIAGNOSIS — R911 Solitary pulmonary nodule: Secondary | ICD-10-CM

## 2017-02-18 LAB — GLUCOSE, CAPILLARY: Glucose-Capillary: 100 mg/dL — ABNORMAL HIGH (ref 65–99)

## 2017-02-18 MED ORDER — FLUDEOXYGLUCOSE F - 18 (FDG) INJECTION
12.6800 | Freq: Once | INTRAVENOUS | Status: AC | PRN
  Administered 2017-02-18: 12.68 via INTRAVENOUS

## 2017-02-22 ENCOUNTER — Inpatient Hospital Stay (HOSPITAL_BASED_OUTPATIENT_CLINIC_OR_DEPARTMENT_OTHER): Payer: Medicare HMO | Admitting: Hematology and Oncology

## 2017-02-22 ENCOUNTER — Encounter: Payer: Self-pay | Admitting: Hematology and Oncology

## 2017-02-22 VITALS — BP 169/83 | HR 65 | Temp 97.1°F | Wt 146.2 lb

## 2017-02-22 DIAGNOSIS — R079 Chest pain, unspecified: Secondary | ICD-10-CM

## 2017-02-22 DIAGNOSIS — K219 Gastro-esophageal reflux disease without esophagitis: Secondary | ICD-10-CM | POA: Diagnosis not present

## 2017-02-22 DIAGNOSIS — K449 Diaphragmatic hernia without obstruction or gangrene: Secondary | ICD-10-CM | POA: Diagnosis not present

## 2017-02-22 DIAGNOSIS — I6529 Occlusion and stenosis of unspecified carotid artery: Secondary | ICD-10-CM

## 2017-02-22 DIAGNOSIS — Z87891 Personal history of nicotine dependence: Secondary | ICD-10-CM

## 2017-02-22 DIAGNOSIS — M722 Plantar fascial fibromatosis: Secondary | ICD-10-CM | POA: Diagnosis not present

## 2017-02-22 DIAGNOSIS — I479 Paroxysmal tachycardia, unspecified: Secondary | ICD-10-CM

## 2017-02-22 DIAGNOSIS — R918 Other nonspecific abnormal finding of lung field: Secondary | ICD-10-CM

## 2017-02-22 DIAGNOSIS — I10 Essential (primary) hypertension: Secondary | ICD-10-CM | POA: Diagnosis not present

## 2017-02-22 DIAGNOSIS — E7800 Pure hypercholesterolemia, unspecified: Secondary | ICD-10-CM | POA: Diagnosis not present

## 2017-02-22 DIAGNOSIS — J45909 Unspecified asthma, uncomplicated: Secondary | ICD-10-CM | POA: Diagnosis not present

## 2017-02-22 DIAGNOSIS — Z86010 Personal history of colonic polyps: Secondary | ICD-10-CM

## 2017-02-22 DIAGNOSIS — Z79899 Other long term (current) drug therapy: Secondary | ICD-10-CM

## 2017-02-22 DIAGNOSIS — M545 Low back pain: Secondary | ICD-10-CM | POA: Diagnosis not present

## 2017-02-22 DIAGNOSIS — Z8673 Personal history of transient ischemic attack (TIA), and cerebral infarction without residual deficits: Secondary | ICD-10-CM

## 2017-02-22 DIAGNOSIS — R911 Solitary pulmonary nodule: Secondary | ICD-10-CM

## 2017-02-22 DIAGNOSIS — C3411 Malignant neoplasm of upper lobe, right bronchus or lung: Secondary | ICD-10-CM | POA: Diagnosis not present

## 2017-02-22 NOTE — Progress Notes (Signed)
[place] [facility] day:  02/22/2017   Chief Complaint: Heather [name] is a 83 y.o. 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  HPI: The patient was last seen in the medical oncology clinic on 02/11/2017.  At that time, chest CT on 02/10/2017 revealed progression of the posterior left lower lobe nodule.  Imaging was concerning for neoplasm.    PET scan on 02/18/2017 revealed a 11 x 8 mm posterior left lower lobe pulmonary nodule that had mildly progressed from prior studies and demonstrated mild hypermetabolism (SUV 1.6).  The appearance was considered worrisome for an indolent primary bronchogenic neoplasm.  Metastasis was considered unlikely.  Symptomatically, patient feels fine. She denies any shortness of breath. Appetite is good.  Weight is up 1 pound.  She denies pain in the clinic today.    Past Medical History:  Diagnosis Date  . Brain aneurysm   . Cancer ([place]) lung   2016  . Carotid artery stenosis   . Colonic polyp   . GERD (gastroesophageal reflux disease)   . Hiatal hernia   . Hypercholesteremia   . Hypertension   . Lung nodule 06/21/2014  . Plantar fasciitis   . Tachycardia, paroxysmal ([place]) 03/31/14    Past Surgical History:  Procedure Laterality Date  . BREAST EXCISIONAL BIOPSY Right 40 yrs ago   neg  . BREAST LUMPECTOMY     benign  . CERVIX LESION DESTRUCTION    . COLONOSCOPY WITH PROPOFOL N/A 03/31/2015   Procedure: COLONOSCOPY WITH PROPOFOL;  Surgeon: Manya Silvas, MD;  [facility] ENDOSCOPY;  Service: Endoscopy;  Laterality: N/A;  . TUBAL LIGATION      Family History  Problem Relation Age of Onset  . Alcohol abuse Mother   . Heart attack Father   . Breast cancer Neg Hx     Social History:  reports that she quit smoking about 28 years ago. She quit after 35.00 years of use. she  has never used smokeless tobacco. She reports that she drinks alcohol. She reports that she does not use drugs.  She lives in [place].  She is accompanied by her husband, 2 sons Psychologist, forensic and [place]), and Lawn (nurse navigator) today.  Allergies:  Allergies  Allergen Reactions  . Contrast Media [Iodinated Diagnostic Agents]   . Cortisone     Patient had facial swelling and itching from cortisone injection IM    Current Medications: Current Outpatient Medications  Medication Sig Dispense Refill  . ALPRAZolam (XANAX) 0.25 MG tablet     . atenolol (TENORMIN) 50 MG tablet Take 50 mg by mouth at bedtime.    . cetirizine (ZYRTEC) 10 MG tablet Take 10 mg by mouth daily.    . Cholecalciferol 1000 UNITS tablet Take 1,000 Units by mouth daily.    . Coenzyme Q10 (CO Q 10) 100 MG CAPS Take 1 capsule by mouth daily.    Marland Kitchen donepezil (ARICEPT) 5 MG tablet Take 5 mg by mouth daily.    . fluticasone (FLONASE) 50 MCG/ACT nasal spray Place into the nose.    . hydrochlorothiazide (HYDRODIURIL) 25 MG tablet Take 25 mg by mouth daily.    Marland Kitchen lisinopril-hydrochlorothiazide (PRINZIDE,ZESTORETIC) 20-12.5 MG per tablet Take 1 tablet by mouth daily.    Marland Kitchen RESVERATROL PO Take 1 capsule by mouth daily.    . simvastatin (ZOCOR) 20  MG tablet Take 20 mg by mouth daily.     No current facility-administered medications for this visit.    Facility-Administered Medications Ordered in Other Visits  Medication Dose Route Frequency Provider Last Rate Last Dose  . 0.9 %  sodium chloride infusion   Intravenous Continuous Manya Silvas, MD        Review of Systems:  GENERAL:  Feels "good".  No fevers or sweats.  Weight up 1 pound since last visit. PERFORMANCE STATUS (ECOG):  0 HEENT:  No visual changes, runny nose, sore throat, mouth sores or tenderness. Lungs:  No shortness of breath or cough.  No hemoptysis. Cardiac:  No chest pain, palpitations, orthopnea, or PND. GI:  No nausea, vomiting, diarrhea, constipation,  melena or hematochezia. Interval colonoscopy. GU:  No urgency, frequency, dysuria, or hematuria.  Interval UTI. Musculoskeletal:  Low back pain due to ruptured disk.  No joint pain.  No muscle tenderness. Extremities:  No pain or swelling. Skin:  No rashes or skin changes. Neuro: No headache, numbness or weakness, balance or coordination issues. Endocrine:  No diabetes, thyroid issues, hot flashes or night sweats. Psych:  No mood changes, depression or anxiety. Pain:  No focal pain. Review of systems:  All other systems reviewed and found to be negative.  Physical Exam: Blood pressure (!) 169/83, pulse 65, temperature (!) 97.1 F (36.2 C), weight 146 lb 3 oz (66.3 kg), SpO2 91 %. GENERAL:  Well developed, well nourished, woman sitting comfortably in the exam room in no acute distress. MENTAL STATUS:  Alert and oriented to person, place and time. HEAD:  Auburn styled hair.  Normocephalic, atraumatic, face symmetric, no Cushingoid features. EYES:  Red glasses.  Brown eyes.  Arcus.  No conjunctivitis or scleral icterus. NEUROLOGICAL: Unremarkable. PSYCH:  Appropriate.   Imaging studies: 02/13/2014:  PET scan revealed a 1 cm right upper lobe hypermetabolic nodule (SUV 3.8) and a 6 mm right upper lobe nodule (no metabolic activity). There was no mediastianl adenopathy.  09/27/2014:  Chest CT revealed images consistent with right upper lobe wedge resection.  There was slight pleural parenchymal nodular thickening and mild distortion consistent with post operative scarring.   04/28/2015:  Chest CT revealed stable postoperative findings and scarring in the right lung.  There was slight nodularity along the wedge resection site.  There was a 7 x 6 mm right upper lobe nodule (unchanged from 02/05/2014).  10/28/2015:  Chest CT revealed no evidence of lung cancer recurrence following right upper lobe wedge resection.  There was stable 7 mm perihilar pulmonary nodule in the right upper lobe.    04/30/2016:  Chest CT revealed stable surgical changes involving the right upper lung from a prior wedge resection.  There was no findings for recurrent tumor, mediastinal/hilar adenopathy or pulmonary metastatic disease.  There was a stable 7 mm superior segment right lower lobe pulmonary nodule.  The left lower lobe 6 mm pulmonary nodule appeared slightly more solid and nodular when compared to prior examination. 08/02/2016:  Chest CT revealed stable surgical changes right lung and a stable 7 mm right retro hilar nodule.  The 7 mm posterior left lower lobe nodule was not substantially changed from 6 mm on the prior study. When comparing back to older exam of 10/28/2015, this was more clearly progressed over that timeframe. 02/10/2017:  Chest CT revealed clear interval progression of the posterior left lower lobe pulmonary nodule, now measuring 12 mm in long axis and having a distinctly lobular contour. Imaging features  were very concerning for neoplasm. Lesion position in the posterior lung base makes it susceptible to motion artifact on PET imaging, but at it's current size, PET imaging may well prove helpful to further evaluate.  There was no change in the 7 mm nodule posterior to the right hilum. 02/18/2017:  PET scan revealed a 11 x 8 mm posterior left lower lobe pulmonary nodule that had mildly progressed from prior studies and demonstrated mild hypermetabolism (SUV 1.6).  The appearance was considered worrisome for an indolent primary bronchogenic neoplasm.  Metastasis was considered unlikely.   No visits with results within 3 Day(s) from this visit.  Latest known visit with results is:  Hospital Outpatient Visit on 02/18/2017  Component Date Value Ref Range Status  . Glucose-Capillary 02/18/2017 100* 65 - 99 mg/dL Final    Assessment:  Heather [name] is a 82 y.o. 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 Pathology revealed a 1.1 cm  high-grade adenocarcinoma with pleural invasion. Nodes were negative. Pathologic stage was T2aN1M0 (stage IB).  PET scan on 02/13/2014 revealed a 1 cm right upper lobe hypermetabolic nodule (SUV 3.8) and a 6 mm right upper lobe nodule (no metabolic activity). There was no mediastianl adenopathy.   Chest CT on 02/10/2017 revealed clear interval progression of the posterior left lower lobe pulmonary nodule, now measuring 12 mm in long axis and having a distinctly lobular contour. Imaging features were very concerning for neoplasm. Lesion position in the posterior lung base makes it susceptible to motion artifact on PET imaging, but at it's current size, PET imaging may well prove helpful to further evaluate.  There was no change in the 7 mm nodule posterior to the right hilum.  PET scan on 02/18/2017 revealed a 11 x 8 mm posterior left lower lobe pulmonary nodule that had mildly progressed from prior studies and demonstrated mild hypermetabolism (SUV 1.6).  The appearance was considered worrisome for an indolent primary bronchogenic neoplasm.  CEA has been followed: 4.7 on 10/01/2014, 4.5 on 07/31/2015, 3.9 on 05/03/2016, and 3.9 on 02/11/2017.  Bilateral mammogram on 07/22/2016 revealed no evidence of malignancy.  She had a UTI which caused confusion.  She had shingles.  Symptomatically, she denies any complaints. Exam is stable.   Plan: 1.  Review interval PET scan- 1.1 cm LLL nodule with mild uptake worrisome for an indolent lung cancer.  2.  Discuss referral to CVTS. Patient to be referred to Dr. Genevive Bi for evaluation for possible wedge resection.  3.  Discuss need for pre-surgical pulmonary function testing. 4.  RTC after patient is seen by Dr. Genevive Bi for discussion regarding treatment. Patient will need to call for an appointment.    Honor Loh, NP  02/22/2017, 11:45 AM  I saw and evaluated the patient, participating in the key portions of the service and reviewing pertinent diagnostic studies  and records.  I reviewed the nurse practitioner's note and agree with the findings and the plan.  The assessment and plan were discussed with the patient.  Several questions were asked by the patient and answered.   Nolon Stalls, MD 02/22/2017,11:45 AM

## 2017-02-22 NOTE — Progress Notes (Signed)
Patient here today to discuss treatment plan and PET results.

## 2017-02-24 ENCOUNTER — Ambulatory Visit: Payer: Medicare HMO | Attending: Urgent Care

## 2017-02-24 DIAGNOSIS — C3411 Malignant neoplasm of upper lobe, right bronchus or lung: Secondary | ICD-10-CM | POA: Diagnosis not present

## 2017-02-24 DIAGNOSIS — R911 Solitary pulmonary nodule: Secondary | ICD-10-CM | POA: Diagnosis not present

## 2017-02-24 MED ORDER — ALBUTEROL SULFATE (2.5 MG/3ML) 0.083% IN NEBU
2.5000 mg | INHALATION_SOLUTION | Freq: Once | RESPIRATORY_TRACT | Status: AC
  Administered 2017-02-24: 2.5 mg via RESPIRATORY_TRACT
  Filled 2017-02-24: qty 3

## 2017-02-25 ENCOUNTER — Ambulatory Visit (INDEPENDENT_AMBULATORY_CARE_PROVIDER_SITE_OTHER): Payer: Medicare HMO | Admitting: Cardiothoracic Surgery

## 2017-02-25 ENCOUNTER — Encounter: Payer: Self-pay | Admitting: Cardiothoracic Surgery

## 2017-02-25 VITALS — BP 155/79 | HR 66 | Temp 98.1°F | Wt 145.0 lb

## 2017-02-25 DIAGNOSIS — R918 Other nonspecific abnormal finding of lung field: Secondary | ICD-10-CM

## 2017-02-25 NOTE — H&P (View-Only) (Signed)
Patient ID: Heather [name], female   DOB: 1935/04/05, 82 y.o.   MRN: [id number]  Chief Complaint  Patient presents with  . Follow-up    Right Upper Lung -Adenocarcinoma    Referred By Dr. Emily Filbert and Dr. Nolon Stalls Reason for Referral left lower lobe mass  HPI Location, Quality, Duration, Severity, Timing, Context, Modifying Factors, Associated Signs and Symptoms.  Heather [name] is a 82 y.o. female.  She underwent a right thoracotomy and right upper lobe wedge resection 3 years ago for an adenocarcinoma of the lung.  She did well after that surgery and has been followed by Dr. Mike Gip in the oncology department ever since.  She has had serial CT scans made as part of her surveillance and has had an enlarging left lower lobe nodule.  This has grown over the last year and a half and now is approximately 1 cm in size.  She underwent PET scanning and pulmonary function testing.  Her PET scan showed some uptake in the area suggestive of a malignancy.  She then had some pulmonary function studies done which reveal an FEV1 and a DLCO in the 75% range.  States that she does not get short of breath.  She is able to walk up a flight of stairs.  She has not smoked in over 30 years.  Overall she seems to be in good functional status.   Past Medical History:  Diagnosis Date  . Brain aneurysm   . Cancer ([place]) lung   2016  . Carotid artery stenosis   . Carotid stenosis 06/19/2013   Overview:  Normal study, 1/16  . Colonic polyp   . GERD (gastroesophageal reflux disease)   . Hiatal hernia   . Hypercholesteremia   . Hyperlipidemia, mixed 06/09/2015  . Hypertension   . Lung nodule 06/21/2014  . Macrocytic 10/06/2014  . Medicare annual wellness visit, initial 07/08/2016   Overview:  6/18  . PAT (paroxysmal atrial tachycardia) ([place]) 06/19/2013  . Plantar fasciitis   . Tachycardia, paroxysmal ([place]) 03/31/14    Past Surgical History:  Procedure Laterality Date  . BREAST EXCISIONAL BIOPSY Right 40  yrs ago   neg  . BREAST LUMPECTOMY     benign  . CERVIX LESION DESTRUCTION    . COLONOSCOPY WITH PROPOFOL N/A 03/31/2015   Procedure: COLONOSCOPY WITH PROPOFOL;  Surgeon: Manya Silvas, MD;  Location: [facility] ENDOSCOPY;  Service: Endoscopy;  Laterality: N/A;  . TUBAL LIGATION      Family History  Problem Relation Age of Onset  . Alcohol abuse Mother   . Heart attack Father   . Breast cancer Neg Hx     Social History Social History   Tobacco Use  . Smoking status: Former Smoker    Years: 35.00    Last attempt to quit: 06/10/1988    Years since quitting: 28.7  . Smokeless tobacco: Never Used  Substance Use Topics  . Alcohol use: Yes    Alcohol/week: 0.0 oz    Comment: occasional glass of wine  . Drug use: No    Allergies  Allergen Reactions  . Contrast Media [Iodinated Diagnostic Agents]   . Cortisone     Patient had facial swelling and itching from cortisone injection IM    Current Outpatient Medications  Medication Sig Dispense Refill  . ALPRAZolam (XANAX) 0.25 MG tablet     . atenolol (TENORMIN) 50 MG tablet Take 50 mg by mouth at bedtime.    . cetirizine (ZYRTEC) 10 MG  tablet Take 10 mg by mouth daily.    . Cholecalciferol 1000 UNITS tablet Take 1,000 Units by mouth daily.    . Coenzyme Q10 (CO Q 10) 100 MG CAPS Take 1 capsule by mouth daily.    Marland Kitchen donepezil (ARICEPT) 5 MG tablet Take 5 mg by mouth daily.    . fluticasone (FLONASE) 50 MCG/ACT nasal spray Place into the nose.    . hydrochlorothiazide (HYDRODIURIL) 25 MG tablet Take 25 mg by mouth daily.    Marland Kitchen lisinopril-hydrochlorothiazide (PRINZIDE,ZESTORETIC) 20-12.5 MG per tablet Take 1 tablet by mouth daily.    Marland Kitchen RESVERATROL PO Take 1 capsule by mouth daily.    . simvastatin (ZOCOR) 20 MG tablet Take 20 mg by mouth daily.     No current facility-administered medications for this visit.    Facility-Administered Medications Ordered in Other Visits  Medication Dose Route Frequency Provider Last Rate Last Dose  .  0.9 %  sodium chloride infusion   Intravenous Continuous Manya Silvas, MD          Review of Systems A complete review of systems was asked and was negative except for the following positive findings skin rash  Blood pressure (!) 155/79, pulse 66, temperature 98.1 F (36.7 C), temperature source Oral, weight 145 lb (65.8 kg), SpO2 94 %.  Physical Exam CONSTITUTIONAL:  Pleasant, well-developed, well-nourished, and in no acute distress. EYES: Pupils equal and reactive to light, Sclera non-icteric EARS, NOSE, MOUTH AND THROAT:  The oropharynx was clear.  Dentition is good repair.  Oral mucosa pink and moist. LYMPH NODES:  Lymph nodes in the neck and axillae were normal RESPIRATORY:  Lungs were clear.  Normal respiratory effort without pathologic use of accessory muscles of respiration CARDIOVASCULAR: Heart was regular without murmurs.  There were no carotid bruits. GI: The abdomen was soft, nontender, and nondistended. There were no palpable masses. There was no hepatosplenomegaly. There were normal bowel sounds in all quadrants. GU:  Rectal deferred.   MUSCULOSKELETAL:  Normal muscle strength and tone.  No clubbing or cyanosis.   SKIN:  There were no pathologic skin lesions.  There were no nodules on palpation.  Her right thoracotomy wound was well-healed.  There are no nodules present. NEUROLOGIC:  Sensation is normal.  Cranial nerves are grossly intact. PSYCH:  Oriented to person, place and time.  Mood and affect are normal.  Data Reviewed CT scans and PET scans  I have personally reviewed the patient's imaging, laboratory findings and medical records.    Assessment    I have independently reviewed her CT scans.  I did review those with the family as well.  There is a slowly enlarging peripheral left lower lobe nodule.  It now measures about 1 cm.  It is highly suspicious for a malignancy.    Plan    I had a long discussion with her regarding the options.  Given her early  stage of lung cancer I believe that she would be a candidate for either surgery or radiation therapy.  I discussed with her the advantages and disadvantages of the various options.  We also reviewed the advantage of wedge resection versus segment versus lobectomy.  I explained to her that formal pulmonary lobectomy was still considered the standard of care but given her advanced age and her prior history of lung resection that a wedge resection would be an adequate treatment plan.  She understands that this may or may not be technically possible but if it is that  is what I would recommend.  She also understands that she would be a candidate for radiation therapy if she were to decline surgery.  After an extensive discussion with her and her family today they would like to proceed with left thoracotomy and wedge resection of left lower lobe mass if possible.  If this is not possible then a lobectomy may be considered.  We will go ahead and set her up for her preoperative evaluation and I will schedule her surgery as soon as we can.       Nestor Lewandowsky, MD 02/25/2017, 10:52 AM

## 2017-02-25 NOTE — Progress Notes (Signed)
Patient ID: Heather [name], female   DOB: Oct 26, 1935, 82 y.o.   MRN: [id number]  Chief Complaint  Patient presents with  . Follow-up    Right Upper Lung -Adenocarcinoma    Referred By Dr. Emily Filbert and Dr. Nolon Stalls Reason for Referral left lower lobe mass  HPI Location, Quality, Duration, Severity, Timing, Context, Modifying Factors, Associated Signs and Symptoms.  Heather [name] is a 82 y.o. female.  She underwent a right thoracotomy and right upper lobe wedge resection 3 years ago for an adenocarcinoma of the lung.  She did well after that surgery and has been followed by Dr. Mike Gip in the oncology department ever since.  She has had serial CT scans made as part of her surveillance and has had an enlarging left lower lobe nodule.  This has grown over the last year and a half and now is approximately 1 cm in size.  She underwent PET scanning and pulmonary function testing.  Her PET scan showed some uptake in the area suggestive of a malignancy.  She then had some pulmonary function studies done which reveal an FEV1 and a DLCO in the 75% range.  States that she does not get short of breath.  She is able to walk up a flight of stairs.  She has not smoked in over 30 years.  Overall she seems to be in good functional status.   Past Medical History:  Diagnosis Date  . Brain aneurysm   . Cancer ([place]) lung   2016  . Carotid artery stenosis   . Carotid stenosis 06/19/2013   Overview:  Normal study, 1/16  . Colonic polyp   . GERD (gastroesophageal reflux disease)   . Hiatal hernia   . Hypercholesteremia   . Hyperlipidemia, mixed 06/09/2015  . Hypertension   . Lung nodule 06/21/2014  . Macrocytic 10/06/2014  . Medicare annual wellness visit, initial 07/08/2016   Overview:  6/18  . PAT (paroxysmal atrial tachycardia) ([place]) 06/19/2013  . Plantar fasciitis   . Tachycardia, paroxysmal ([place]) 03/31/14    Past Surgical History:  Procedure Laterality Date  . BREAST EXCISIONAL BIOPSY Right 40  yrs ago   neg  . BREAST LUMPECTOMY     benign  . CERVIX LESION DESTRUCTION    . COLONOSCOPY WITH PROPOFOL N/A 03/31/2015   Procedure: COLONOSCOPY WITH PROPOFOL;  Surgeon: Manya Silvas, MD;  Location: [facility] ENDOSCOPY;  Service: Endoscopy;  Laterality: N/A;  . TUBAL LIGATION      Family History  Problem Relation Age of Onset  . Alcohol abuse Mother   . Heart attack Father   . Breast cancer Neg Hx     Social History Social History   Tobacco Use  . Smoking status: Former Smoker    Years: 35.00    Last attempt to quit: 06/10/1988    Years since quitting: 28.7  . Smokeless tobacco: Never Used  Substance Use Topics  . Alcohol use: Yes    Alcohol/week: 0.0 oz    Comment: occasional glass of wine  . Drug use: No    Allergies  Allergen Reactions  . Contrast Media [Iodinated Diagnostic Agents]   . Cortisone     Patient had facial swelling and itching from cortisone injection IM    Current Outpatient Medications  Medication Sig Dispense Refill  . ALPRAZolam (XANAX) 0.25 MG tablet     . atenolol (TENORMIN) 50 MG tablet Take 50 mg by mouth at bedtime.    . cetirizine (ZYRTEC) 10 MG  tablet Take 10 mg by mouth daily.    . Cholecalciferol 1000 UNITS tablet Take 1,000 Units by mouth daily.    . Coenzyme Q10 (CO Q 10) 100 MG CAPS Take 1 capsule by mouth daily.    Marland Kitchen donepezil (ARICEPT) 5 MG tablet Take 5 mg by mouth daily.    . fluticasone (FLONASE) 50 MCG/ACT nasal spray Place into the nose.    . hydrochlorothiazide (HYDRODIURIL) 25 MG tablet Take 25 mg by mouth daily.    Marland Kitchen lisinopril-hydrochlorothiazide (PRINZIDE,ZESTORETIC) 20-12.5 MG per tablet Take 1 tablet by mouth daily.    Marland Kitchen RESVERATROL PO Take 1 capsule by mouth daily.    . simvastatin (ZOCOR) 20 MG tablet Take 20 mg by mouth daily.     No current facility-administered medications for this visit.    Facility-Administered Medications Ordered in Other Visits  Medication Dose Route Frequency Provider Last Rate Last Dose  .  0.9 %  sodium chloride infusion   Intravenous Continuous Manya Silvas, MD          Review of Systems A complete review of systems was asked and was negative except for the following positive findings skin rash  Blood pressure (!) 155/79, pulse 66, temperature 98.1 F (36.7 C), temperature source Oral, weight 145 lb (65.8 kg), SpO2 94 %.  Physical Exam CONSTITUTIONAL:  Pleasant, well-developed, well-nourished, and in no acute distress. EYES: Pupils equal and reactive to light, Sclera non-icteric EARS, NOSE, MOUTH AND THROAT:  The oropharynx was clear.  Dentition is good repair.  Oral mucosa pink and moist. LYMPH NODES:  Lymph nodes in the neck and axillae were normal RESPIRATORY:  Lungs were clear.  Normal respiratory effort without pathologic use of accessory muscles of respiration CARDIOVASCULAR: Heart was regular without murmurs.  There were no carotid bruits. GI: The abdomen was soft, nontender, and nondistended. There were no palpable masses. There was no hepatosplenomegaly. There were normal bowel sounds in all quadrants. GU:  Rectal deferred.   MUSCULOSKELETAL:  Normal muscle strength and tone.  No clubbing or cyanosis.   SKIN:  There were no pathologic skin lesions.  There were no nodules on palpation.  Her right thoracotomy wound was well-healed.  There are no nodules present. NEUROLOGIC:  Sensation is normal.  Cranial nerves are grossly intact. PSYCH:  Oriented to person, place and time.  Mood and affect are normal.  Data Reviewed CT scans and PET scans  I have personally reviewed the patient's imaging, laboratory findings and medical records.    Assessment    I have independently reviewed her CT scans.  I did review those with the family as well.  There is a slowly enlarging peripheral left lower lobe nodule.  It now measures about 1 cm.  It is highly suspicious for a malignancy.    Plan    I had a long discussion with her regarding the options.  Given her early  stage of lung cancer I believe that she would be a candidate for either surgery or radiation therapy.  I discussed with her the advantages and disadvantages of the various options.  We also reviewed the advantage of wedge resection versus segment versus lobectomy.  I explained to her that formal pulmonary lobectomy was still considered the standard of care but given her advanced age and her prior history of lung resection that a wedge resection would be an adequate treatment plan.  She understands that this may or may not be technically possible but if it is that  is what I would recommend.  She also understands that she would be a candidate for radiation therapy if she were to decline surgery.  After an extensive discussion with her and her family today they would like to proceed with left thoracotomy and wedge resection of left lower lobe mass if possible.  If this is not possible then a lobectomy may be considered.  We will go ahead and set her up for her preoperative evaluation and I will schedule her surgery as soon as we can.       Nestor Lewandowsky, MD 02/25/2017, 10:52 AM

## 2017-02-28 ENCOUNTER — Telehealth: Payer: Self-pay

## 2017-02-28 ENCOUNTER — Telehealth: Payer: Self-pay | Admitting: Cardiothoracic Surgery

## 2017-02-28 ENCOUNTER — Other Ambulatory Visit: Payer: Self-pay

## 2017-02-28 DIAGNOSIS — R918 Other nonspecific abnormal finding of lung field: Secondary | ICD-10-CM

## 2017-02-28 NOTE — Telephone Encounter (Signed)
Faxed Dr. [name] Heck a Medical Clearance so it could be filled out before patient's surgery.

## 2017-02-28 NOTE — Telephone Encounter (Signed)
Pt advised of pre op date/time and sx date. Sx: 03/10/17 with Dr Kathlyn [place] assisting-Pre op bronchoscopy, left thoracotomy with wedge resection, possible lobectomy.  Pre op: 03/03/17 @ 9:00am--office interview.   Patient made aware to call [phone], between 1-3:00pm the day before surgery, to find out what time to arrive.

## 2017-03-03 ENCOUNTER — Ambulatory Visit
Admission: RE | Admit: 2017-03-03 | Discharge: 2017-03-03 | Disposition: A | Discharge status: Home / Self Care | Payer: Medicare HMO | Source: Ambulatory Visit | Attending: Cardiothoracic Surgery | Admitting: Cardiothoracic Surgery

## 2017-03-03 ENCOUNTER — Encounter
Admission: RE | Admit: 2017-03-03 | Discharge: 2017-03-03 | Disposition: A | Discharge status: Home / Self Care | Payer: Medicare HMO | Source: Ambulatory Visit | Attending: Cardiothoracic Surgery | Admitting: Cardiothoracic Surgery

## 2017-03-03 ENCOUNTER — Other Ambulatory Visit: Payer: Self-pay

## 2017-03-03 DIAGNOSIS — Z01812 Encounter for preprocedural laboratory examination: Secondary | ICD-10-CM | POA: Insufficient documentation

## 2017-03-03 DIAGNOSIS — R918 Other nonspecific abnormal finding of lung field: Secondary | ICD-10-CM | POA: Insufficient documentation

## 2017-03-03 DIAGNOSIS — Z01810 Encounter for preprocedural cardiovascular examination: Secondary | ICD-10-CM | POA: Diagnosis not present

## 2017-03-03 DIAGNOSIS — R001 Bradycardia, unspecified: Secondary | ICD-10-CM | POA: Insufficient documentation

## 2017-03-03 DIAGNOSIS — R9431 Abnormal electrocardiogram [ECG] [EKG]: Secondary | ICD-10-CM | POA: Insufficient documentation

## 2017-03-03 DIAGNOSIS — I10 Essential (primary) hypertension: Secondary | ICD-10-CM | POA: Diagnosis not present

## 2017-03-03 LAB — DIFFERENTIAL
BASOS PCT: 1 %
Basophils Absolute: 0 10*3/uL (ref 0–0.1)
EOS ABS: 0.1 10*3/uL (ref 0–0.7)
Eosinophils Relative: 2 %
LYMPHS ABS: 1.9 10*3/uL (ref 1.0–3.6)
Lymphocytes Relative: 32 %
MONO ABS: 0.4 10*3/uL (ref 0.2–0.9)
MONOS PCT: 8 %
Neutro Abs: 3.3 10*3/uL (ref 1.4–6.5)
Neutrophils Relative %: 57 %

## 2017-03-03 LAB — COMPREHENSIVE METABOLIC PANEL
ALT: 21 U/L (ref 14–54)
AST: 28 U/L (ref 15–41)
Albumin: 4.4 g/dL (ref 3.5–5.0)
Alkaline Phosphatase: 63 U/L (ref 38–126)
Anion gap: 10 (ref 5–15)
BILIRUBIN TOTAL: 0.8 mg/dL (ref 0.3–1.2)
BUN: 22 mg/dL — AB (ref 6–20)
CHLORIDE: 103 mmol/L (ref 101–111)
CO2: 28 mmol/L (ref 22–32)
Calcium: 9.5 mg/dL (ref 8.9–10.3)
Creatinine, Ser: 0.83 mg/dL (ref 0.44–1.00)
Glucose, Bld: 109 mg/dL — ABNORMAL HIGH (ref 65–99)
Potassium: 4 mmol/L (ref 3.5–5.1)
Sodium: 141 mmol/L (ref 135–145)
TOTAL PROTEIN: 7.3 g/dL (ref 6.5–8.1)

## 2017-03-03 LAB — PROTIME-INR
INR: 0.98
PROTHROMBIN TIME: 12.9 s (ref 11.4–15.2)

## 2017-03-03 LAB — CBC
HEMATOCRIT: 45.6 % (ref 35.0–47.0)
HEMOGLOBIN: 15.2 g/dL (ref 12.0–16.0)
MCH: 33.7 pg (ref 26.0–34.0)
MCHC: 33.4 g/dL (ref 32.0–36.0)
MCV: 100.8 fL — AB (ref 80.0–100.0)
Platelets: 152 10*3/uL (ref 150–440)
RBC: 4.53 MIL/uL (ref 3.80–5.20)
RDW: 13.3 % (ref 11.5–14.5)
WBC: 5.8 10*3/uL (ref 3.6–11.0)

## 2017-03-03 LAB — SURGICAL PCR SCREEN
MRSA, PCR: NEGATIVE
Staphylococcus aureus: NEGATIVE

## 2017-03-03 LAB — APTT: aPTT: 29 seconds (ref 24–36)

## 2017-03-03 NOTE — Patient Instructions (Signed)
Your procedure is scheduled on: March 10, 2017 THURSDAY Report to Same Day Surgery on the 2nd floor in the [facility]. To find out your arrival time, please call [phone] between 1PM - 3PM on: Wednesday March 09, 2017  REMEMBER: Instructions that are not followed completely may result in serious medical risk, up to and including death; or upon the discretion of your surgeon and anesthesiologist your surgery may need to be rescheduled.  Do not eat food after midnight the night before your procedure.  No gum chewing or hard candies.  You may however, drink CLEAR liquids up to 2 hours before you are scheduled to arrive at the hospital for your procedure.  Do not drink clear liquids within 2 hours of the start of your surgery.  Clear liquids include: - water  - apple juice without pulp - clear gatorade - black coffee or tea (Do NOT add anything to the coffee or tea) Do NOT drink anything that is not on this list.  No Alcohol for 24 hours before or after surgery.  No Smoking including e-cigarettes for 24 hours prior to surgery. No chewable tobacco products for at least 6 hours prior to surgery. No nicotine patches on the day of surgery.  On the morning of surgery brush your teeth with toothpaste and water, you may rinse your mouth with mouthwash if you wish. Do not swallow any  toothpaste of mouthwash.  Notify your doctor if there is any change in your medical condition (cold, fever, infection).  Do not wear jewelry, make-up, hairpins, clips or nail polish.  Do not wear lotions, powders, or perfumes. You may NOT wear deodorant.  Do not shave 48 hours prior to surgery. Men may shave face and neck.  Contacts and dentures may not be worn into surgery.  Do not bring valuables to the hospital. [facility] is not responsible for any belongings or valuables.   TAKE THESE MEDICATIONS THE MORNING OF SURGERY WITH A SIP OF WATER: ZYRTEC   Use CHG Soap    Use FLONASE  on  the day of surgery and bring to the hospital.  Stop Anti-inflammatories such as Advil, Aleve, Ibuprofen, Motrin, Naproxen, Naprosyn, Goodie powder, or aspirin products. (May take Tylenol or Acetaminophen if needed.) UNTIL AFTER SURGERY   Stop ANY OVER THE COUNTER supplements until after surgery CO Q 10. (May continue Vitamin D, Vitamin B, and multivitamin.)  If you are being admitted to the hospital overnight, leave your suitcase in the car. After surgery it may be brought to your room.  If you are being discharged the day of surgery, you will not be allowed to drive home. You will need someone to drive you home and stay with you that night.   If you are taking public transportation, you will need to have a responsible adult to with you.  Please call the number above if you have any questions about these instructions.

## 2017-03-07 ENCOUNTER — Telehealth: Payer: Self-pay

## 2017-03-07 MED ORDER — PROPOFOL 500 MG/50ML IV EMUL
INTRAVENOUS | Status: AC
  Filled 2017-03-07: qty 50

## 2017-03-07 NOTE — Telephone Encounter (Signed)
Medical clearance was obtained. This will be placed in patient's chart under media. I will fax a copy to pre-admit.

## 2017-03-07 NOTE — Telephone Encounter (Signed)
Medical Clearance obtained and approved at this time. Clearance can be found under Media Tab in patients chart.

## 2017-03-09 MED ORDER — SODIUM CHLORIDE 0.9 % IV SOLN
1.5000 g | INTRAVENOUS | Status: AC
  Administered 2017-03-10: 1.5 g via INTRAVENOUS
  Filled 2017-03-09: qty 1.5

## 2017-03-10 ENCOUNTER — Inpatient Hospital Stay: Payer: Medicare HMO | Admitting: Anesthesiology

## 2017-03-10 ENCOUNTER — Encounter: Payer: Self-pay | Admitting: *Deleted

## 2017-03-10 ENCOUNTER — Inpatient Hospital Stay: Payer: Medicare HMO

## 2017-03-10 ENCOUNTER — Encounter
Admission: RE | Disposition: A | Discharge status: Home / Self Care | Payer: Self-pay | Source: Ambulatory Visit | Attending: Cardiothoracic Surgery

## 2017-03-10 ENCOUNTER — Inpatient Hospital Stay
Admission: RE | Admit: 2017-03-10 | Discharge: 2017-03-15 | DRG: 182 | Disposition: A | Discharge status: Home / Self Care | Payer: Medicare HMO | Source: Ambulatory Visit | Attending: Cardiothoracic Surgery | Admitting: Cardiothoracic Surgery

## 2017-03-10 ENCOUNTER — Other Ambulatory Visit: Payer: Self-pay

## 2017-03-10 DIAGNOSIS — J449 Chronic obstructive pulmonary disease, unspecified: Secondary | ICD-10-CM | POA: Diagnosis present

## 2017-03-10 DIAGNOSIS — R918 Other nonspecific abnormal finding of lung field: Secondary | ICD-10-CM

## 2017-03-10 DIAGNOSIS — Z79899 Other long term (current) drug therapy: Secondary | ICD-10-CM

## 2017-03-10 DIAGNOSIS — I739 Peripheral vascular disease, unspecified: Secondary | ICD-10-CM | POA: Diagnosis present

## 2017-03-10 DIAGNOSIS — C3432 Malignant neoplasm of lower lobe, left bronchus or lung: Secondary | ICD-10-CM | POA: Diagnosis not present

## 2017-03-10 DIAGNOSIS — R402413 Glasgow coma scale score 13-15, at hospital admission: Secondary | ICD-10-CM | POA: Diagnosis present

## 2017-03-10 DIAGNOSIS — C3492 Malignant neoplasm of unspecified part of left bronchus or lung: Secondary | ICD-10-CM | POA: Diagnosis present

## 2017-03-10 DIAGNOSIS — G709 Myoneural disorder, unspecified: Secondary | ICD-10-CM | POA: Diagnosis present

## 2017-03-10 DIAGNOSIS — I6529 Occlusion and stenosis of unspecified carotid artery: Secondary | ICD-10-CM | POA: Diagnosis present

## 2017-03-10 DIAGNOSIS — Z09 Encounter for follow-up examination after completed treatment for conditions other than malignant neoplasm: Secondary | ICD-10-CM

## 2017-03-10 DIAGNOSIS — Z91041 Radiographic dye allergy status: Secondary | ICD-10-CM

## 2017-03-10 DIAGNOSIS — R911 Solitary pulmonary nodule: Secondary | ICD-10-CM

## 2017-03-10 DIAGNOSIS — Z8679 Personal history of other diseases of the circulatory system: Secondary | ICD-10-CM

## 2017-03-10 DIAGNOSIS — Z87891 Personal history of nicotine dependence: Secondary | ICD-10-CM

## 2017-03-10 DIAGNOSIS — Z902 Acquired absence of lung [part of]: Secondary | ICD-10-CM | POA: Diagnosis not present

## 2017-03-10 DIAGNOSIS — R0602 Shortness of breath: Secondary | ICD-10-CM | POA: Diagnosis not present

## 2017-03-10 DIAGNOSIS — F419 Anxiety disorder, unspecified: Secondary | ICD-10-CM | POA: Diagnosis present

## 2017-03-10 DIAGNOSIS — E782 Mixed hyperlipidemia: Secondary | ICD-10-CM | POA: Diagnosis present

## 2017-03-10 DIAGNOSIS — Z888 Allergy status to other drugs, medicaments and biological substances status: Secondary | ICD-10-CM | POA: Diagnosis not present

## 2017-03-10 DIAGNOSIS — Z9889 Other specified postprocedural states: Secondary | ICD-10-CM

## 2017-03-10 DIAGNOSIS — M1990 Unspecified osteoarthritis, unspecified site: Secondary | ICD-10-CM | POA: Diagnosis present

## 2017-03-10 DIAGNOSIS — I4891 Unspecified atrial fibrillation: Secondary | ICD-10-CM | POA: Diagnosis present

## 2017-03-10 DIAGNOSIS — R0789 Other chest pain: Secondary | ICD-10-CM | POA: Diagnosis not present

## 2017-03-10 DIAGNOSIS — E876 Hypokalemia: Secondary | ICD-10-CM | POA: Diagnosis not present

## 2017-03-10 DIAGNOSIS — K219 Gastro-esophageal reflux disease without esophagitis: Secondary | ICD-10-CM | POA: Diagnosis present

## 2017-03-10 DIAGNOSIS — I10 Essential (primary) hypertension: Secondary | ICD-10-CM | POA: Diagnosis present

## 2017-03-10 HISTORY — PX: THORACOTOMY/LOBECTOMY: SHX6116

## 2017-03-10 HISTORY — PX: FLEXIBLE BRONCHOSCOPY: SHX5094

## 2017-03-10 LAB — ABO/RH: ABO/RH(D): A POS

## 2017-03-10 LAB — GLUCOSE, CAPILLARY: GLUCOSE-CAPILLARY: 136 mg/dL — AB (ref 65–99)

## 2017-03-10 SURGERY — LOBECTOMY, LUNG, OPEN
Anesthesia: General | Wound class: Clean Contaminated

## 2017-03-10 MED ORDER — ROCURONIUM BROMIDE 50 MG/5ML IV SOLN
INTRAVENOUS | Status: AC
  Filled 2017-03-10: qty 1

## 2017-03-10 MED ORDER — FENTANYL CITRATE (PF) 100 MCG/2ML IJ SOLN
INTRAMUSCULAR | Status: AC
  Filled 2017-03-10: qty 2

## 2017-03-10 MED ORDER — SODIUM CHLORIDE 0.9 % IV SOLN: Freq: Once | INTRAVENOUS | Status: DC

## 2017-03-10 MED ORDER — PROPOFOL 10 MG/ML IV BOLUS
INTRAVENOUS | Status: AC
  Filled 2017-03-10: qty 20

## 2017-03-10 MED ORDER — ACETAMINOPHEN 10 MG/ML IV SOLN
INTRAVENOUS | Status: DC | PRN
  Administered 2017-03-10: 1000 mg via INTRAVENOUS

## 2017-03-10 MED ORDER — MIDAZOLAM HCL 2 MG/2ML IJ SOLN
INTRAMUSCULAR | Status: AC
  Filled 2017-03-10: qty 2

## 2017-03-10 MED ORDER — HYDROCHLOROTHIAZIDE 12.5 MG PO CAPS
12.5000 mg | ORAL_CAPSULE | Freq: Every day | ORAL | Status: DC
  Administered 2017-03-10 – 2017-03-13 (×4): 12.5 mg via ORAL
  Filled 2017-03-10 (×4): qty 1

## 2017-03-10 MED ORDER — ALPRAZOLAM 0.25 MG PO TABS: 0.2500 mg | ORAL_TABLET | Freq: Two times a day (BID) | ORAL | Status: DC | PRN

## 2017-03-10 MED ORDER — FENTANYL CITRATE (PF) 100 MCG/2ML IJ SOLN
INTRAMUSCULAR | Status: AC
  Administered 2017-03-10: 25 ug via INTRAVENOUS
  Filled 2017-03-10: qty 2

## 2017-03-10 MED ORDER — FENTANYL CITRATE (PF) 100 MCG/2ML IJ SOLN
25.0000 ug | INTRAMUSCULAR | Status: DC | PRN
  Administered 2017-03-10: 25 ug via INTRAVENOUS
  Administered 2017-03-10: 50 ug via INTRAVENOUS
  Administered 2017-03-10: 25 ug via INTRAVENOUS

## 2017-03-10 MED ORDER — SODIUM CHLORIDE 0.9 % IV SOLN
INTRAVENOUS | Status: DC | PRN
  Administered 2017-03-10: 70 mL

## 2017-03-10 MED ORDER — PROMETHAZINE HCL 25 MG/ML IJ SOLN: 12.5000 mg | Freq: Four times a day (QID) | INTRAMUSCULAR | Status: DC | PRN

## 2017-03-10 MED ORDER — BUPIVACAINE LIPOSOME 1.3 % IJ SUSP
INTRAMUSCULAR | Status: AC
  Filled 2017-03-10: qty 20

## 2017-03-10 MED ORDER — TRAMADOL HCL 50 MG PO TABS
50.0000 mg | ORAL_TABLET | Freq: Four times a day (QID) | ORAL | Status: DC
  Administered 2017-03-10: 50 mg via ORAL
  Filled 2017-03-10: qty 1

## 2017-03-10 MED ORDER — ALBUTEROL SULFATE (2.5 MG/3ML) 0.083% IN NEBU
2.5000 mg | INHALATION_SOLUTION | RESPIRATORY_TRACT | Status: DC
  Administered 2017-03-10 – 2017-03-15 (×17): 2.5 mg via RESPIRATORY_TRACT
  Filled 2017-03-10 (×19): qty 3

## 2017-03-10 MED ORDER — MORPHINE SULFATE (PF) 2 MG/ML IV SOLN: 1.0000 mg | INTRAVENOUS | Status: DC | PRN

## 2017-03-10 MED ORDER — BUPIVACAINE HCL (PF) 0.5 % IJ SOLN
INTRAMUSCULAR | Status: AC
  Filled 2017-03-10: qty 30

## 2017-03-10 MED ORDER — EPHEDRINE SULFATE 50 MG/ML IJ SOLN
INTRAMUSCULAR | Status: DC | PRN
  Administered 2017-03-10: 10 mg via INTRAVENOUS
  Administered 2017-03-10: 15 mg via INTRAVENOUS

## 2017-03-10 MED ORDER — ACETAMINOPHEN 10 MG/ML IV SOLN
INTRAVENOUS | Status: AC
  Filled 2017-03-10: qty 100

## 2017-03-10 MED ORDER — LACTATED RINGERS IV SOLN
INTRAVENOUS | Status: DC
  Administered 2017-03-10: 08:00:00 via INTRAVENOUS

## 2017-03-10 MED ORDER — ROCURONIUM BROMIDE 100 MG/10ML IV SOLN
INTRAVENOUS | Status: DC | PRN
  Administered 2017-03-10: 40 mg via INTRAVENOUS
  Administered 2017-03-10: 10 mg via INTRAVENOUS
  Administered 2017-03-10: 50 mg via INTRAVENOUS

## 2017-03-10 MED ORDER — SIMVASTATIN 20 MG PO TABS
20.0000 mg | ORAL_TABLET | Freq: Every day | ORAL | Status: DC
  Administered 2017-03-11 – 2017-03-15 (×5): 20 mg via ORAL
  Filled 2017-03-10 (×5): qty 1

## 2017-03-10 MED ORDER — ONDANSETRON HCL 4 MG/2ML IJ SOLN
4.0000 mg | Freq: Four times a day (QID) | INTRAMUSCULAR | Status: DC | PRN
  Administered 2017-03-10 – 2017-03-12 (×2): 4 mg via INTRAVENOUS
  Filled 2017-03-10 (×2): qty 2

## 2017-03-10 MED ORDER — ONDANSETRON HCL 4 MG/2ML IJ SOLN
INTRAMUSCULAR | Status: DC | PRN
  Administered 2017-03-10: 4 mg via INTRAVENOUS

## 2017-03-10 MED ORDER — FENTANYL CITRATE (PF) 100 MCG/2ML IJ SOLN
12.5000 ug | INTRAMUSCULAR | Status: DC | PRN
  Administered 2017-03-11: 12.5 ug via INTRAVENOUS
  Filled 2017-03-10: qty 2

## 2017-03-10 MED ORDER — SODIUM CHLORIDE 0.9 % IJ SOLN
INTRAMUSCULAR | Status: AC
  Filled 2017-03-10: qty 10

## 2017-03-10 MED ORDER — METOCLOPRAMIDE HCL 5 MG/ML IJ SOLN: 5.0000 mg | Freq: Four times a day (QID) | INTRAMUSCULAR | Status: DC

## 2017-03-10 MED ORDER — VASOPRESSIN 20 UNIT/ML IV SOLN
INTRAVENOUS | Status: DC | PRN
  Administered 2017-03-10: 1 [IU] via INTRAVENOUS

## 2017-03-10 MED ORDER — HYDROMORPHONE HCL 1 MG/ML IJ SOLN
0.5000 mg | Freq: Once | INTRAMUSCULAR | Status: AC
  Administered 2017-03-10: 0.5 mg via INTRAVENOUS
  Filled 2017-03-10: qty 1

## 2017-03-10 MED ORDER — SUGAMMADEX SODIUM 200 MG/2ML IV SOLN
INTRAVENOUS | Status: DC | PRN
  Administered 2017-03-10: 130 mg via INTRAVENOUS

## 2017-03-10 MED ORDER — FAMOTIDINE 20 MG PO TABS
ORAL_TABLET | ORAL | Status: AC
  Filled 2017-03-10: qty 1

## 2017-03-10 MED ORDER — MIDAZOLAM HCL 2 MG/2ML IJ SOLN
INTRAMUSCULAR | Status: DC | PRN
  Administered 2017-03-10: 2 mg via INTRAVENOUS

## 2017-03-10 MED ORDER — BISACODYL 5 MG PO TBEC
10.0000 mg | DELAYED_RELEASE_TABLET | Freq: Every day | ORAL | Status: DC
  Administered 2017-03-11 – 2017-03-12 (×2): 10 mg via ORAL
  Filled 2017-03-10 (×4): qty 2

## 2017-03-10 MED ORDER — ONDANSETRON HCL 4 MG/2ML IJ SOLN
INTRAMUSCULAR | Status: AC
  Filled 2017-03-10: qty 2

## 2017-03-10 MED ORDER — SODIUM CHLORIDE FLUSH 0.9 % IV SOLN
INTRAVENOUS | Status: AC
  Administered 2017-03-10: 10 mL
  Filled 2017-03-10: qty 10

## 2017-03-10 MED ORDER — OXYCODONE HCL 5 MG PO TABS: 5.0000 mg | ORAL_TABLET | Freq: Once | ORAL | Status: DC | PRN

## 2017-03-10 MED ORDER — ATENOLOL 50 MG PO TABS
50.0000 mg | ORAL_TABLET | Freq: Every day | ORAL | Status: DC
  Administered 2017-03-11 – 2017-03-13 (×2): 50 mg via ORAL
  Filled 2017-03-10 (×6): qty 1

## 2017-03-10 MED ORDER — METOCLOPRAMIDE HCL 5 MG/ML IJ SOLN
5.0000 mg | Freq: Four times a day (QID) | INTRAMUSCULAR | Status: DC | PRN
  Administered 2017-03-10 – 2017-03-13 (×7): 5 mg via INTRAVENOUS
  Filled 2017-03-10 (×7): qty 2

## 2017-03-10 MED ORDER — FENTANYL CITRATE (PF) 250 MCG/5ML IJ SOLN
INTRAMUSCULAR | Status: AC
  Filled 2017-03-10: qty 5

## 2017-03-10 MED ORDER — METOCLOPRAMIDE HCL 5 MG/ML IJ SOLN: 5.0000 mg | Freq: Four times a day (QID) | INTRAMUSCULAR | Status: DC | PRN

## 2017-03-10 MED ORDER — BUPIVACAINE HCL (PF) 0.5 % IJ SOLN
INTRAMUSCULAR | Status: DC | PRN
  Administered 2017-03-10: 30 mL

## 2017-03-10 MED ORDER — FAMOTIDINE 20 MG PO TABS
20.0000 mg | ORAL_TABLET | Freq: Once | ORAL | Status: AC
  Administered 2017-03-10: 20 mg via ORAL

## 2017-03-10 MED ORDER — SODIUM CHLORIDE 0.9 % IJ SOLN
INTRAMUSCULAR | Status: AC
  Filled 2017-03-10: qty 50

## 2017-03-10 MED ORDER — SUGAMMADEX SODIUM 200 MG/2ML IV SOLN
INTRAVENOUS | Status: AC
  Filled 2017-03-10: qty 2

## 2017-03-10 MED ORDER — GLYCOPYRROLATE 0.2 MG/ML IJ SOLN
INTRAMUSCULAR | Status: DC | PRN
  Administered 2017-03-10: .2 mg via INTRAVENOUS

## 2017-03-10 MED ORDER — LISINOPRIL-HYDROCHLOROTHIAZIDE 20-12.5 MG PO TABS: 1.0000 | ORAL_TABLET | Freq: Every day | ORAL | Status: DC

## 2017-03-10 MED ORDER — HYDROMORPHONE HCL 1 MG/ML IJ SOLN: 0.5000 mg | INTRAMUSCULAR | Status: DC | PRN

## 2017-03-10 MED ORDER — DEXTROSE-NACL 5-0.45 % IV SOLN
INTRAVENOUS | Status: DC
  Administered 2017-03-10 – 2017-03-12 (×4): via INTRAVENOUS

## 2017-03-10 MED ORDER — SODIUM CHLORIDE 0.9 % IV SOLN
1.5000 g | Freq: Two times a day (BID) | INTRAVENOUS | Status: AC
  Administered 2017-03-10 – 2017-03-11 (×2): 1.5 g via INTRAVENOUS
  Filled 2017-03-10 (×2): qty 1.5

## 2017-03-10 MED ORDER — SUCCINYLCHOLINE CHLORIDE 20 MG/ML IJ SOLN
INTRAMUSCULAR | Status: AC
  Filled 2017-03-10: qty 1

## 2017-03-10 MED ORDER — FENTANYL CITRATE (PF) 100 MCG/2ML IJ SOLN
INTRAMUSCULAR | Status: DC | PRN
  Administered 2017-03-10 (×2): 100 ug via INTRAVENOUS

## 2017-03-10 MED ORDER — OXYCODONE HCL 5 MG/5ML PO SOLN: 5.0000 mg | Freq: Once | ORAL | Status: DC | PRN

## 2017-03-10 MED ORDER — PROPOFOL 10 MG/ML IV BOLUS
INTRAVENOUS | Status: DC | PRN
  Administered 2017-03-10: 150 mg via INTRAVENOUS
  Administered 2017-03-10: 50 mg via INTRAVENOUS

## 2017-03-10 MED ORDER — OXYCODONE-ACETAMINOPHEN 5-325 MG PO TABS
1.0000 | ORAL_TABLET | ORAL | Status: DC | PRN
  Administered 2017-03-10: 1 via ORAL
  Filled 2017-03-10: qty 1

## 2017-03-10 MED ORDER — LISINOPRIL 20 MG PO TABS
20.0000 mg | ORAL_TABLET | Freq: Every day | ORAL | Status: DC
  Administered 2017-03-10 – 2017-03-13 (×4): 20 mg via ORAL
  Filled 2017-03-10 (×4): qty 1

## 2017-03-10 MED ORDER — SODIUM CHLORIDE 0.9 % IV BOLUS (SEPSIS)
250.0000 mL | Freq: Once | INTRAVENOUS | Status: AC
  Administered 2017-03-10: 250 mL via INTRAVENOUS

## 2017-03-10 SURGICAL SUPPLY — 64 items
BENZOIN TINCTURE PRP APPL 2/3 (GAUZE/BANDAGES/DRESSINGS) ×3 IMPLANT
BNDG COHESIVE 4X5 TAN STRL (GAUZE/BANDAGES/DRESSINGS) IMPLANT
BRONCHOSCOPE PED SLIM DISP (MISCELLANEOUS) ×3 IMPLANT
CANISTER SUCT 1200ML W/VALVE (MISCELLANEOUS) ×3 IMPLANT
CATH FOLEY 2WAY 5CC 18FR (CATHETERS) ×3 IMPLANT
CATH THOR STR 28F  SOFT WA (CATHETERS) ×1
CATH THOR STR 28F SOFT WA (CATHETERS) ×2 IMPLANT
CATH URET ROBINSON 16FR STRL (CATHETERS) ×3 IMPLANT
CHLORAPREP W/TINT 26ML (MISCELLANEOUS) ×6 IMPLANT
CNTNR SPEC 2.5X3XGRAD LEK (MISCELLANEOUS) ×2
CONT SPEC 4OZ STER OR WHT (MISCELLANEOUS) ×1
CONTAINER SPEC 2.5X3XGRAD LEK (MISCELLANEOUS) ×2 IMPLANT
CUTTER ECHEON FLEX ENDO 45 340 (ENDOMECHANICALS) IMPLANT
DRAIN CHANNEL 19F RND (DRAIN) ×3 IMPLANT
DRAIN CHEST DRY SUCT SGL (MISCELLANEOUS) ×3 IMPLANT
DRAPE C-SECTION (MISCELLANEOUS) IMPLANT
DRAPE MAG INST 16X20 L/F (DRAPES) ×3 IMPLANT
DRSG OPSITE POSTOP 4X6 (GAUZE/BANDAGES/DRESSINGS) ×3 IMPLANT
DRSG OPSITE POSTOP 4X8 (GAUZE/BANDAGES/DRESSINGS) ×3 IMPLANT
DRSG TELFA 3X8 NADH (GAUZE/BANDAGES/DRESSINGS) ×3 IMPLANT
ELECT BLADE 6.5 EXT (BLADE) ×3 IMPLANT
ELECT CAUTERY BLADE TIP 2.5 (TIP) ×3
ELECT REM PT RETURN 9FT ADLT (ELECTROSURGICAL) ×3
ELECTRODE CAUTERY BLDE TIP 2.5 (TIP) ×2 IMPLANT
ELECTRODE REM PT RTRN 9FT ADLT (ELECTROSURGICAL) ×2 IMPLANT
GAUZE SPONGE 4X4 12PLY STRL (GAUZE/BANDAGES/DRESSINGS) ×3 IMPLANT
GLOVE SURG SYN 7.5  E (GLOVE) ×6
GLOVE SURG SYN 7.5 E (GLOVE) ×12 IMPLANT
GOWN STRL REUS W/ TWL LRG LVL3 (GOWN DISPOSABLE) ×12 IMPLANT
GOWN STRL REUS W/TWL LRG LVL3 (GOWN DISPOSABLE) ×6
KIT TURNOVER KIT A (KITS) ×3 IMPLANT
LABEL OR SOLS (LABEL) ×3 IMPLANT
LOOP RED MAXI  1X406MM (MISCELLANEOUS) ×1
LOOP VESSEL MAXI 1X406 RED (MISCELLANEOUS) ×2 IMPLANT
MARKER SKIN DUAL TIP RULER LAB (MISCELLANEOUS) ×3 IMPLANT
PACK BASIN MAJOR ARMC (MISCELLANEOUS) ×3 IMPLANT
RELOAD STAPLER GOLD 60MM (STAPLE) ×6 IMPLANT
RELOAD STAPLER LINE PROX 30 GR (STAPLE) ×2 IMPLANT
SPONGE KITTNER 5P (MISCELLANEOUS) ×3 IMPLANT
STAPLER ECHELON LONG 60 440 (INSTRUMENTS) ×3 IMPLANT
STAPLER RELOAD GOLD 60MM (STAPLE) ×9
STAPLER RELOAD LINE PROX 30 GR (STAPLE) ×3
STAPLER SKIN PROX 35W (STAPLE) ×3 IMPLANT
STAPLER VASCULAR ECHELON 35 (CUTTER) IMPLANT
STRIP CLOSURE SKIN 1/2X4 (GAUZE/BANDAGES/DRESSINGS) ×3 IMPLANT
SUT MNCRL AB 3-0 PS2 27 (SUTURE) IMPLANT
SUT PROLENE 5 0 RB 1 DA (SUTURE) IMPLANT
SUT SILK 0 (SUTURE) ×1
SUT SILK 0 30XBRD TIE 6 (SUTURE) ×2 IMPLANT
SUT SILK 1 SH (SUTURE) ×21 IMPLANT
SUT VIC AB 0 CT1 36 (SUTURE) ×6 IMPLANT
SUT VIC AB 2-0 CT1 27 (SUTURE) ×2
SUT VIC AB 2-0 CT1 TAPERPNT 27 (SUTURE) ×4 IMPLANT
SUT VICRYL 2 TP 1 (SUTURE) ×9 IMPLANT
SYR 10ML SLIP (SYRINGE) ×3 IMPLANT
SYR BULB IRRIG 60ML STRL (SYRINGE) ×3 IMPLANT
TAPE ADH 3 LX (MISCELLANEOUS) ×3 IMPLANT
TAPE TRANSPORE STRL 2 31045 (GAUZE/BANDAGES/DRESSINGS) ×3 IMPLANT
TRAY FOLEY W/METER SILVER 16FR (SET/KITS/TRAYS/PACK) ×3 IMPLANT
TROCAR FLEXIPATH 20X80 (ENDOMECHANICALS) IMPLANT
TROCAR FLEXIPATH THORACIC 15MM (ENDOMECHANICALS) IMPLANT
TUBING CONNECTING 10 (TUBING) ×3 IMPLANT
WATER STERILE IRR 1000ML POUR (IV SOLUTION) ×3 IMPLANT
YANKAUER SUCT BULB TIP FLEX NO (MISCELLANEOUS) ×3 IMPLANT

## 2017-03-10 NOTE — Transfer of Care (Signed)
Immediate Anesthesia Transfer of Care Note  Patient: Heather [name]  Procedure(s) Performed: THORACOTOMY WITH LUNG WEDGE RESECTION POSSIBLE LOBECTOMY (Left ) FLEXIBLE BRONCHOSCOPY (N/A )  Patient Location: PACU  Anesthesia Type:General  Level of Consciousness: drowsy and patient cooperative  Airway & Oxygen Therapy: Patient Spontanous Breathing and Patient connected to face mask oxygen  Post-op Assessment: Report given to RN and Post -op Vital signs reviewed and stable  Post vital signs: Reviewed and stable  Last Vitals:  Vitals:   03/10/17 1145 03/10/17 1148  BP: (!) 176/65 (!) 176/65  Pulse: 71 76  Resp: 13 20  Temp: 36.5 C   SpO2: 100% 100%    Last Pain:  Vitals:   03/10/17 0714  PainSc: 0-No pain         Complications: No apparent anesthesia complications

## 2017-03-10 NOTE — Op Note (Signed)
  03/10/2017  11:45 AM  PATIENT:  Heather [name]  81 y.o. female  PRE-OPERATIVE DIAGNOSIS: Left lower lobe mass  POST-OPERATIVE DIAGNOSIS: Left lower lobe mass  PROCEDURE: Preoperative bronchoscopy to assess endobronchial anatomy; left thoracotomy with wedge resection of left lower lobe mass  SURGEON:  Surgeon(s) and Role:    * Nestor Lewandowsky, MD - Primary    * Clayburn Pert, MD - Assisting.  Due to the complexity of this case and the lack of any other available assistants Dr. Clayburn Pert was required  ASSISTANTS: Verne Grain PAS  ANESTHESIA: General  INDICATIONS FOR PROCEDURE this patient is a 82 year old woman with enlarging left lower lobe mass.  She has had a previous right thoracotomy for a lung resection for carcinoma.  The workup was suggestive of a new primary lung cancer in her left lower lobe.  She was apprised of the indications and the risks of surgery.  She gave her informed consent.  DICTATION: Patient was brought to the operating suite placed in supine position.  General endotracheal anesthesia was given through the double-lumen tube.  Preoperative bronchoscopy was carried out.  There was no major endobronchial tumors within the trachea and the mainstem bronchi.  There were some bloody secretions present which obscured visualization of the distal airways.  The patient was then turned for a left thoracotomy.  All pressure points were carefully padded.  The patient was prepped and draped in usual sterile fashion.  We began by making a muscle sparing thoracotomy and going through approximately the fifth interspace.  The serratus and latissimus muscles were spared and retracted anteriorly and posteriorly.  Upon entering the chest there was some adhesions from the lower lobe to the diaphragm.  These were taken down with direct visualization.  The inferior pulmonary ligament was divided using electrocautery.  Palpation of the lung then confirmed the presence of a 1 cm lesion  within the inferior aspect of the left lower lobe.  We then decided to perform a wedge resection of the tumor.  This was accomplished using multiple firings of the GIA stapler.  Frozen section confirmed the presence of an adenocarcinoma with negative margins.  The wedge resection was done in the triangular fashion and a stitch was used to mark the closest margin.  Hemostasis was complete throughout.  After the frozen section confirmed the diagnosis we elected to stop at that point.  A single chest tube was positioned to the apex of the lung.  This was secured with silk sutures.  20 cc of Exparel with 30 cc of half percent Marcaine and 50 cc of saline were then used to provide local anesthesia.  The ribs were then closed.  #2 Vicryl pericostal sutures were used to approximate the ribs.  The serratus and latissimus muscles were allowed to return to the normal anatomic position.  They were secured together with 0 silk the subcutaneous tissues were drained with a [place].  The subcutaneous tissues were closed with a 2-0 Vicryl and the skin with skin clips.  All sponge needle and instrument counts were correct as reported to me at the end the case.  Patient was extubated and taken to the recovery room in stable condition.   Nestor Lewandowsky, MD

## 2017-03-10 NOTE — Plan of Care (Signed)
Pt and family oriented to ICU 

## 2017-03-10 NOTE — Anesthesia Preprocedure Evaluation (Addendum)
Anesthesia Evaluation  Patient identified by MRN, date of birth, ID band Patient awake    Reviewed: Allergy & Precautions, H&P , NPO status , Patient's Chart, lab work & pertinent test results  History of Anesthesia Complications (+) PONV and history of anesthetic complications  Airway Mallampati: III  TM Distance: >3 FB Neck ROM: limited    Dental  (+) Chipped, Poor Dentition, Missing   Pulmonary neg shortness of breath, COPD, former smoker,           Cardiovascular Exercise Tolerance: Good hypertension, (-) angina+ Peripheral Vascular Disease  (-) Past MI and (-) DOE + dysrhythmias Atrial Fibrillation      Neuro/Psych  Neuromuscular disease negative psych ROS   GI/Hepatic Neg liver ROS, hiatal hernia, GERD  Medicated and Controlled,  Endo/Other  negative endocrine ROS  Renal/GU      Musculoskeletal  (+) Arthritis ,   Abdominal   Peds  Hematology negative hematology ROS (+)   Anesthesia Other Findings Past Medical History: No date: Brain aneurysm lung: Cancer ([place])     Comment:  2016 No date: Carotid artery stenosis 06/19/2013: Carotid stenosis     Comment:  Overview:  Normal study, 1/16 No date: Colonic polyp No date: GERD (gastroesophageal reflux disease) No date: Hiatal hernia No date: Hypercholesteremia 06/09/2015: Hyperlipidemia, mixed No date: Hypertension 06/21/2014: Lung nodule 10/06/2014: Macrocytic 07/08/2016: Medicare annual wellness visit, initial     Comment:  Overview:  6/18 06/19/2013: PAT (paroxysmal atrial tachycardia) ([facility]) No date: Plantar fasciitis 03/31/14: Tachycardia, paroxysmal ([place])  Past Surgical History: 40 yrs ago: BREAST EXCISIONAL BIOPSY; Right     Comment:  neg No date: BREAST LUMPECTOMY     Comment:  benign No date: CATARACT EXTRACTION W/ INTRAOCULAR LENS  IMPLANT, BILATERAL;  Bilateral 2004: CEREBRAL ANEURYSM REPAIR No date: CERVIX LESION DESTRUCTION 03/31/2015:  COLONOSCOPY WITH PROPOFOL; N/A     Comment:  Procedure: COLONOSCOPY WITH PROPOFOL;  Surgeon: Manya Silvas, MD;  Location: [place];  Service:               Endoscopy;  Laterality: N/A; No date: TUBAL LIGATION  BMI    Body Mass Index:  25.79 kg/m      Reproductive/Obstetrics negative OB ROS                            Anesthesia Physical Anesthesia Plan  ASA: III  Anesthesia Plan: General ETT   Post-op Pain Management:    Induction: Intravenous  PONV Risk Score and Plan: Ondansetron and Midazolam  Airway Management Planned: Double Lumen EBT  Additional Equipment:   Intra-op Plan:   Post-operative Plan: Extubation in OR and Possible Post-op intubation/ventilation  Informed Consent: I have reviewed the patients History and Physical, chart, labs and discussed the procedure including the risks, benefits and alternatives for the proposed anesthesia with the patient or authorized representative who has indicated his/her understanding and acceptance.   Dental Advisory Given  Plan Discussed with: Anesthesiologist, CRNA and Surgeon  Anesthesia Plan Comments: (Patient consented for risks of anesthesia including but not limited to:  - adverse reactions to medications - damage to teeth, lips or other oral mucosa - sore throat or hoarseness - Damage to heart, brain, lungs or loss of life  Patient voiced understanding.)       Anesthesia Quick Evaluation

## 2017-03-10 NOTE — Consult Note (Signed)
PULMONARY / CRITICAL CARE MEDICINE   Name: Heather [name] MRN: [id number] DOB: [birth year]    ADMISSION DATE:  03/10/2017 CONSULTATION DATE:  03/10/17  REFERRING MD:  Dr. Genevive Bi  CHIEF COMPLAINT:  S/P left thoracotomy with wedge resection   SIGNIFICANT EVENTS: 2/14 Left thoracotomy with left lower lobe wedge resection, postoperatively admitted to ICU  HISTORY OF PRESENT ILLNESS:   41F with PMH as below significant for right lung adenocarcinoma s/p RUL wedge resection 3 years ago, who was found to have a left lower lobe nodule suspicious for malignancy. She underwent left thoracotomy with wedge resection this admission and was subsequently admitted to the ICU post-operatively. CC has been consulted to assist with medical management.   On exam, the pt complains of incisional pain in the left chest as well as mild sore throat 2/2 intubation.   PAST MEDICAL HISTORY :  She  has a past medical history of Brain aneurysm, Cancer ([place]) (lung), Carotid artery stenosis, Carotid stenosis (06/19/2013), Colonic polyp, GERD (gastroesophageal reflux disease), Hiatal hernia, Hypercholesteremia, Hyperlipidemia, mixed (06/09/2015), Hypertension, Lung nodule (06/21/2014), Macrocytic (10/06/2014), Medicare annual wellness visit, initial (07/08/2016), PAT (paroxysmal atrial tachycardia) ([place]) (06/19/2013), Plantar fasciitis, and Tachycardia, paroxysmal ([place]) (03/31/14).  PAST SURGICAL HISTORY: She  has a past surgical history that includes Cervix lesion destruction; Breast lumpectomy; Tubal ligation; Colonoscopy with propofol (N/A, 03/31/2015); Breast excisional biopsy (Right, 40 yrs ago); Cataract extraction w/ intraocular lens  implant, bilateral (Bilateral); and Cerebral aneurysm repair (2004).  Allergies  Allergen Reactions  . Contrast Media [Iodinated Diagnostic Agents]   . Cortisone     Patient had facial swelling and itching from cortisone injection IM    Current Facility-Administered Medications on File Prior to  Encounter  Medication  . 0.9 %  sodium chloride infusion   Current Outpatient Medications on File Prior to Encounter  Medication Sig  . ALPRAZolam (XANAX) 0.25 MG tablet Take 0.25 mg by mouth 2 (two) times daily as needed for anxiety.   Marland Kitchen atenolol (TENORMIN) 50 MG tablet Take 50 mg by mouth at bedtime.  . cetirizine (ZYRTEC) 10 MG tablet Take 10 mg by mouth daily.  . Cholecalciferol 1000 UNITS tablet Take 1,000 Units by mouth daily.  . Coenzyme Q10 (CO Q 10) 100 MG CAPS Take 1 capsule by mouth daily.  Marland Kitchen donepezil (ARICEPT) 5 MG tablet Take 5 mg by mouth at bedtime.   . fluticasone (FLONASE) 50 MCG/ACT nasal spray Place 1 spray into the nose daily as needed for allergies.   . hydrochlorothiazide (HYDRODIURIL) 25 MG tablet Take 25 mg by mouth daily as needed.   Marland Kitchen lisinopril-hydrochlorothiazide (PRINZIDE,ZESTORETIC) 20-12.5 MG per tablet Take 1 tablet by mouth daily.  . simvastatin (ZOCOR) 20 MG tablet Take 20 mg by mouth daily.    FAMILY HISTORY:  Her indicated that her mother is deceased. She indicated that her father is deceased. She indicated that the status of her neg hx is unknown.   SOCIAL HISTORY: She  reports that she quit smoking about 28 years ago. She quit after 35.00 years of use. she has never used smokeless tobacco. She reports that she drinks alcohol. She reports that she does not use drugs.  REVIEW OF SYSTEMS:   10-point review of systems negative except as per HPI  SUBJECTIVE:  Doing well, not interested in eating/drinking as she is afraid of vomiting  VITAL SIGNS: BP (!) 160/65   Pulse 65   Temp 97.6 F (36.4 C) (Oral)   Resp 15   Ht  5\' 2"  (1.575 m)   Wt 69.4 kg (152 lb 16 oz)   SpO2 99%   BMI 27.98 kg/m   HEMODYNAMICS:    VENTILATOR SETTINGS:    INTAKE / OUTPUT: No intake/output data recorded.  PHYSICAL EXAMINATION: General: non-distressed Neuro:  Grossly non-focal HEENT:  NCAT, PERRL Cardiovascular:  S1S2, RRR, no m/g/r, 2+ peripheral pulses,  no LE edema Lungs:  CTAB, some respiratory splinting, no increased WOB, left chest tube in place with water seal to -20 suction Abdomen:  Soft, non-tender, non-distended Musculoskeletal:  No gross deformity Skin:  Warm, dry, acyanotic  LABS:  BMET No results for input(s): NA, K, CL, CO2, BUN, CREATININE, GLUCOSE in the last 168 hours.  Electrolytes No results for input(s): CALCIUM, MG, PHOS in the last 168 hours.  CBC No results for input(s): WBC, HGB, HCT, PLT in the last 168 hours.  Coag's No results for input(s): APTT, INR in the last 168 hours.  Sepsis Markers No results for input(s): LATICACIDVEN, PROCALCITON, O2SATVEN in the last 168 hours.  ABG No results for input(s): PHART, PCO2ART, PO2ART in the last 168 hours.  Liver Enzymes No results for input(s): AST, ALT, ALKPHOS, BILITOT, ALBUMIN in the last 168 hours.  Cardiac Enzymes No results for input(s): TROPONINI, PROBNP in the last 168 hours.  Glucose Recent Labs  Lab 03/10/17 1309  GLUCAP 136*    Imaging Dg Chest Port 1 View  Result Date: 03/10/2017 CLINICAL DATA:  Postop. EXAM: PORTABLE CHEST 1 VIEW COMPARISON:  03/03/2017 chest radiograph.  PET of 02/18/2017. FINDINGS: Midline trachea. Normal heart size for level of inspiration. Atherosclerosis in the transverse aorta. Placement of a left-sided chest tube. Suspect an approximately 5% left apical pneumothorax, with the visceral pleural line 9 mm from the chest wall. Trace left pleural fluid. Low lung volumes with resultant pulmonary interstitial prominence. No overt congestive failure. Left greater than right base atelectasis. IMPRESSION: Interval placement of a left-sided chest tube with suspicion of a 5% left apical pneumothorax. Trace left pleural fluid suspected. Diminished lung volumes with bibasilar atelectasis. Aortic and branch vessel atherosclerosis. Electronically Signed   By: Abigail Miyamoto M.D.   On: 03/10/2017 12:13   STUDIES:  2/14 Postoperative CXR  with chest tube in place, very small left pneumothorax, and bibasilar atelectasis  LINES/TUBES: 19 Blake left chest tube drained to waterseal with suction at -20. Serosanguinous fluid noted in canister  DISCUSSION: 64F with recurrent adenocarcinoma of the lung, s/p left lower lobe wedge resection today. Consulted for medical management.   ASSESSMENT / PLAN:  PULMONARY A: Adenocarcinoma of the left lung s/p left thoracotomy with left lower lobe wedge resection P:   Management as per thoracic surgery Continue ultram and percocet PRN for pain Recommend incentive spirometer  CARDIOVASCULAR A:  H/O HTN H/O HLD P:  Continue lisinopril/HCTZ, and atenolol Continue Simvastatin  RENAL A:   No issues P:   Monitor BMP  GASTROINTESTINAL A:   Post-operative nausea/vomiting P:   Continue PRN Zofran Diet as tolerated  HEMATOLOGIC A:   No issues P:  DVT prophylaxis with SCD  Plan to start lovenox POD 1 Monitor CBC  INFECTIOUS A:   No issues P:    ENDOCRINE A:   No issues P:    NEUROLOGIC A:   Anxiety P:   Continue Xanax PRN  Audery Amel, PA-S 03/10/2017, 4:00 PM

## 2017-03-10 NOTE — Anesthesia Post-op Follow-up Note (Signed)
Anesthesia QCDR form completed.        

## 2017-03-10 NOTE — Progress Notes (Signed)
Chaplain met with patient offering prayer and spiritual support. 

## 2017-03-10 NOTE — Anesthesia Procedure Notes (Signed)
Procedure Name: Intubation Performed by: Jonna Clark, CRNA Pre-anesthesia Checklist: Patient identified, Patient being monitored, Timeout performed, Emergency Drugs available and Suction available Patient Re-evaluated:Patient Re-evaluated prior to induction Oxygen Delivery Method: Circle system utilized Preoxygenation: Pre-oxygenation with 100% oxygen Induction Type: IV induction Ventilation: Mask ventilation without difficulty Laryngoscope Size: 3 and McGraph Grade View: Grade I Endobronchial tube: Left, Double lumen EBT, EBT position confirmed by fiberoptic bronchoscope, Bronchial Blocker placed under direct vision and EBT position confirmed by auscultation and 37 Fr Number of attempts: 2 Airway Equipment and Method: Stylet Placement Confirmation: ETT inserted through vocal cords under direct vision,  positive ETCO2 and breath sounds checked- equal and bilateral Secured at: 29 cm Tube secured with: Tape Dental Injury: Teeth and Oropharynx as per pre-operative assessment  Comments: Easy mask. Started with 39Fr tube and switched to 37 per sugeon request

## 2017-03-10 NOTE — Progress Notes (Signed)
Dr Faith Rogue notified of decreased UOP, bolus pt 250 and increase primary rate to 75 ml/hr. He & Dr Celesta Aver aware pt experiencing pain but does not want to turn in bed or take narcotics,, she is "terrified I might throw up, and I think I would pass out."  She agreed to take .5 mg dilaudid ordered by Celesta Aver.  Writer administered Zofran first, but she did still experience a bout of severe nausea without actual emesis.  Writer gave her more ginger ale and a cold cloth for her head.  Writer unable to activate the pulmonary function on the bed d/t the [facility] order at 45 degrees.  Minimal sanguinous drainage into CT or JP drain.

## 2017-03-10 NOTE — Consult Note (Signed)
PULMONARY / CRITICAL CARE MEDICINE   Name: Heather [name] MRN: [id number] DOB: 04/20/1935    ADMISSION DATE:  03/10/2017 CONSULTATION DATE:  03/10/17  REFERRING MD:  Dr. Genevive Bi  CHIEF COMPLAINT:  S/P left thoracotomy with wedge resection   SIGNIFICANT EVENTS: 2/14 Left thoracotomy with left lower lobe wedge resection, postoperatively admitted to ICU  HISTORY OF PRESENT ILLNESS:   60F with PMH as below significant for right lung adenocarcinoma s/p RUL wedge resection 3 years ago, who was found to have a left lower lobe nodule suspicious for malignancy. She underwent left thoracotomy with wedge resection this admission and was subsequently admitted to the ICU post-operatively. CC has been consulted to assist with medical management.   On exam, the pt complains of incisional pain in the left chest as well as mild sore throat 2/2 intubation.   PAST MEDICAL HISTORY :  She  has a past medical history of Brain aneurysm, Cancer ([place]) (lung), Carotid artery stenosis, Carotid stenosis (06/19/2013), Colonic polyp, GERD (gastroesophageal reflux disease), Hiatal hernia, Hypercholesteremia, Hyperlipidemia, mixed (06/09/2015), Hypertension, Lung nodule (06/21/2014), Macrocytic (10/06/2014), Medicare annual wellness visit, initial (07/08/2016), PAT (paroxysmal atrial tachycardia) ([place]) (06/19/2013), Plantar fasciitis, and Tachycardia, paroxysmal ([place]) (03/31/14).  PAST SURGICAL HISTORY: She  has a past surgical history that includes Cervix lesion destruction; Breast lumpectomy; Tubal ligation; Colonoscopy with propofol (N/A, 03/31/2015); Breast excisional biopsy (Right, 40 yrs ago); Cataract extraction w/ intraocular lens  implant, bilateral (Bilateral); and Cerebral aneurysm repair (2004).  Allergies  Allergen Reactions  . Contrast Media [Iodinated Diagnostic Agents]   . Cortisone     Patient had facial swelling and itching from cortisone injection IM    Current Facility-Administered Medications on File Prior to  Encounter  Medication  . 0.9 %  sodium chloride infusion   Current Outpatient Medications on File Prior to Encounter  Medication Sig  . ALPRAZolam (XANAX) 0.25 MG tablet Take 0.25 mg by mouth 2 (two) times daily as needed for anxiety.   Marland Kitchen atenolol (TENORMIN) 50 MG tablet Take 50 mg by mouth at bedtime.  . cetirizine (ZYRTEC) 10 MG tablet Take 10 mg by mouth daily.  . Cholecalciferol 1000 UNITS tablet Take 1,000 Units by mouth daily.  . Coenzyme Q10 (CO Q 10) 100 MG CAPS Take 1 capsule by mouth daily.  Marland Kitchen donepezil (ARICEPT) 5 MG tablet Take 5 mg by mouth at bedtime.   . fluticasone (FLONASE) 50 MCG/ACT nasal spray Place 1 spray into the nose daily as needed for allergies.   . hydrochlorothiazide (HYDRODIURIL) 25 MG tablet Take 25 mg by mouth daily as needed.   Marland Kitchen lisinopril-hydrochlorothiazide (PRINZIDE,ZESTORETIC) 20-12.5 MG per tablet Take 1 tablet by mouth daily.  . simvastatin (ZOCOR) 20 MG tablet Take 20 mg by mouth daily.    FAMILY HISTORY:  Her indicated that her mother is deceased. She indicated that her father is deceased. She indicated that the status of her neg hx is unknown.   SOCIAL HISTORY: She  reports that she quit smoking about 28 years ago. She quit after 35.00 years of use. she has never used smokeless tobacco. She reports that she drinks alcohol. She reports that she does not use drugs.  REVIEW OF SYSTEMS:   10-point review of systems negative except as per HPI  SUBJECTIVE:  Doing well, not interested in eating/drinking as she is afraid of vomiting  VITAL SIGNS: BP (!) 149/66   Pulse 65   Temp 97.6 F (36.4 C) (Oral)   Resp 15   Ht  5\' 2"  (1.575 m)   Wt 152 lb 16 oz (69.4 kg)   SpO2 98%   BMI 27.98 kg/m   HEMODYNAMICS:    VENTILATOR SETTINGS:    INTAKE / OUTPUT: I/O last 3 completed shifts: In: 2162 [P.O.:237; I.V.:1675; IV Piggyback:250] Out: 512 [Urine:425; Drains:25; Blood:10; Chest Tube:52]  PHYSICAL EXAMINATION: General:  non-distressed Neuro:  Grossly non-focal HEENT:  NCAT, PERRL Cardiovascular:  S1S2, RRR, no m/g/r, 2+ peripheral pulses, no LE edema Lungs:  CTAB, some respiratory splinting, no increased WOB, left chest tube in place with water seal to -20 suction Abdomen:  Soft, non-tender, non-distended Musculoskeletal:  No gross deformity Skin:  Warm, dry, acyanotic  LABS:  BMET No results for input(s): NA, K, CL, CO2, BUN, CREATININE, GLUCOSE in the last 168 hours.  Electrolytes No results for input(s): CALCIUM, MG, PHOS in the last 168 hours.  CBC No results for input(s): WBC, HGB, HCT, PLT in the last 168 hours.  Coag's No results for input(s): APTT, INR in the last 168 hours.  Sepsis Markers No results for input(s): LATICACIDVEN, PROCALCITON, O2SATVEN in the last 168 hours.  ABG No results for input(s): PHART, PCO2ART, PO2ART in the last 168 hours.  Liver Enzymes No results for input(s): AST, ALT, ALKPHOS, BILITOT, ALBUMIN in the last 168 hours.  Cardiac Enzymes No results for input(s): TROPONINI, PROBNP in the last 168 hours.  Glucose Recent Labs  Lab 03/10/17 1309  GLUCAP 136*    Imaging Dg Chest Port 1 View  Result Date: 03/10/2017 CLINICAL DATA:  Postop. EXAM: PORTABLE CHEST 1 VIEW COMPARISON:  03/03/2017 chest radiograph.  PET of 02/18/2017. FINDINGS: Midline trachea. Normal heart size for level of inspiration. Atherosclerosis in the transverse aorta. Placement of a left-sided chest tube. Suspect an approximately 5% left apical pneumothorax, with the visceral pleural line 9 mm from the chest wall. Trace left pleural fluid. Low lung volumes with resultant pulmonary interstitial prominence. No overt congestive failure. Left greater than right base atelectasis. IMPRESSION: Interval placement of a left-sided chest tube with suspicion of a 5% left apical pneumothorax. Trace left pleural fluid suspected. Diminished lung volumes with bibasilar atelectasis. Aortic and branch vessel  atherosclerosis. Electronically Signed   By: Abigail Miyamoto M.D.   On: 03/10/2017 12:13   STUDIES:  2/14 Postoperative CXR with chest tube in place, very small left pneumothorax, and bibasilar atelectasis  LINES/TUBES: 19 Blake left chest tube drained to waterseal with suction at -20. Serosanguinous fluid noted in canister  DISCUSSION: 60F with recurrent adenocarcinoma of the lung, s/p left lower lobe wedge resection today. Consulted for medical management.   ASSESSMENT / PLAN:  1. Adenocarcinoma of the left lung s/p left thoracotomy with left lower lobe wedge resection P:   Management as per thoracic surgery Continue ultram and percocet PRN for pain Recommend incentive spirometer.  Patient given a dose of 0.5 Dilaudid because of on going operative site pain. Very keen to control pain in order for patient to be compliant with incentives.    2. Hx of  Hypertension P:  Continue lisinopril/HCTZ, and atenolol Continue Simvastatin. Close monitor; avoid hypotension.   3. Hx of Anxiety P:   Continue Xanax PRN  4. Hx of GOERD P GI prophylaxis If post op H&H remains stable, start SQ Heparin in AM, continue with SCD's.  Cammie Sickle, MD 03/10/2017, 7:34 PM

## 2017-03-10 NOTE — Progress Notes (Signed)
Chaplain follow up with patient and family.  Chaplain met with patient in PACU and CCU and provided spiritual and emotional support.

## 2017-03-10 NOTE — Interval H&P Note (Signed)
History and Physical Interval Note:  03/10/2017 8:04 AM  Heather [name]  has presented today for surgery, with the diagnosis of LUNG MASS  The various methods of treatment have been discussed with the patient and family. After consideration of risks, benefits and other options for treatment, the patient has consented to  Procedure(s): THORACOTOMY WITH LUNG WEDGE RESECTION POSSIBLE LOBECTOMY (Left) FLEXIBLE BRONCHOSCOPY (N/A) as a surgical intervention .  The patient's history has been reviewed, patient examined, no change in status, stable for surgery.  I have reviewed the patient's chart and labs.  Questions were answered to the patient's satisfaction.     Nestor Lewandowsky

## 2017-03-11 ENCOUNTER — Inpatient Hospital Stay: Payer: Medicare HMO

## 2017-03-11 ENCOUNTER — Encounter: Payer: Self-pay | Admitting: Cardiothoracic Surgery

## 2017-03-11 DIAGNOSIS — C3492 Malignant neoplasm of unspecified part of left bronchus or lung: Principal | ICD-10-CM

## 2017-03-11 LAB — COMPREHENSIVE METABOLIC PANEL
ALT: 16 U/L (ref 14–54)
AST: 30 U/L (ref 15–41)
Albumin: 3.3 g/dL — ABNORMAL LOW (ref 3.5–5.0)
Alkaline Phosphatase: 44 U/L (ref 38–126)
Anion gap: 9 (ref 5–15)
BUN: 10 mg/dL (ref 6–20)
CHLORIDE: 101 mmol/L (ref 101–111)
CO2: 26 mmol/L (ref 22–32)
CREATININE: 0.75 mg/dL (ref 0.44–1.00)
Calcium: 8.5 mg/dL — ABNORMAL LOW (ref 8.9–10.3)
GFR calc non Af Amer: >60 mL/min (ref >60)
Glucose, Bld: 176 mg/dL — ABNORMAL HIGH (ref 65–99)
POTASSIUM: 3.6 mmol/L (ref 3.5–5.1)
SODIUM: 136 mmol/L (ref 135–145)
Total Bilirubin: 0.6 mg/dL (ref 0.3–1.2)
Total Protein: 5.8 g/dL — ABNORMAL LOW (ref 6.5–8.1)

## 2017-03-11 LAB — TYPE AND SCREEN
ABO/RH(D): A POS
ANTIBODY SCREEN: NEGATIVE
UNIT DIVISION: 0
Unit division: 0

## 2017-03-11 LAB — BPAM RBC
BLOOD PRODUCT EXPIRATION DATE: 201903122359
Blood Product Expiration Date: 201903122359
ISSUE DATE / TIME: 201902141004
Unit Type and Rh: 5100
Unit Type and Rh: 5100

## 2017-03-11 LAB — CBC
HCT: 37.6 % (ref 35.0–47.0)
HEMOGLOBIN: 12.6 g/dL (ref 12.0–16.0)
MCH: 34.2 pg — ABNORMAL HIGH (ref 26.0–34.0)
MCHC: 33.5 g/dL (ref 32.0–36.0)
MCV: 102 fL — AB (ref 80.0–100.0)
Platelets: 127 10*3/uL — ABNORMAL LOW (ref 150–440)
RBC: 3.69 MIL/uL — ABNORMAL LOW (ref 3.80–5.20)
RDW: 12.9 % (ref 11.5–14.5)
WBC: 8.5 10*3/uL (ref 3.6–11.0)

## 2017-03-11 LAB — PREPARE RBC (CROSSMATCH)

## 2017-03-11 LAB — PHOSPHORUS: Phosphorus: 3.3 mg/dL (ref 2.5–4.6)

## 2017-03-11 LAB — MAGNESIUM: Magnesium: 1.7 mg/dL (ref 1.7–2.4)

## 2017-03-11 MED ORDER — FENTANYL 12 MCG/HR TD PT72
12.5000 ug | MEDICATED_PATCH | TRANSDERMAL | Status: DC
  Administered 2017-03-11: 12.5 ug via TRANSDERMAL
  Filled 2017-03-11 (×2): qty 1

## 2017-03-11 NOTE — Consult Note (Signed)
PULMONARY / CRITICAL CARE MEDICINE   Name: Heather [name] MRN: [id number] DOB: May 15, 1935    ADMISSION DATE:  03/10/2017 CONSULTATION DATE:  03/10/17  REFERRING MD:  Dr. Genevive Bi  CHIEF COMPLAINT:  S/P left thoracotomy with wedge resection   SIGNIFICANT EVENTS: 2/14 Left thoracotomy with left lower lobe wedge resection, postoperatively admitted to ICU 2/15 Pt transferred to floor in stable condition, hospitalist following  HISTORY OF PRESENT ILLNESS:   55F with PMH as below significant for right lung adenocarcinoma s/p RUL wedge resection 3 years ago, who was found to have a left lower lobe nodule suspicious for malignancy. She underwent left thoracotomy with wedge resection this admission and was subsequently admitted to the ICU post-operatively. CC has been consulted to assist with medical management.   On exam, the pt complains of incisional pain in the left chest as well as mild sore throat 2/2 intubation. She had 2 episodes of vomiting overnight but denies n/v at present.   PAST MEDICAL HISTORY :  She  has a past medical history of Brain aneurysm, Cancer ([place]) (lung), Carotid artery stenosis, Carotid stenosis (06/19/2013), Colonic polyp, GERD (gastroesophageal reflux disease), Hiatal hernia, Hypercholesteremia, Hyperlipidemia, mixed (06/09/2015), Hypertension, Lung nodule (06/21/2014), Macrocytic (10/06/2014), Medicare annual wellness visit, initial (07/08/2016), PAT (paroxysmal atrial tachycardia) ([place]) (06/19/2013), Plantar fasciitis, and Tachycardia, paroxysmal ([place]) (03/31/14).  PAST SURGICAL HISTORY: She  has a past surgical history that includes Cervix lesion destruction; Breast lumpectomy; Tubal ligation; Colonoscopy with propofol (N/A, 03/31/2015); Breast excisional biopsy (Right, 40 yrs ago); Cataract extraction w/ intraocular lens  implant, bilateral (Bilateral); Cerebral aneurysm repair (2004); Thoracotomy/lobectomy (Left, 03/10/2017); and Flexible bronchoscopy (N/A, 03/10/2017).  Allergies   Allergen Reactions  . Contrast Media [Iodinated Diagnostic Agents]   . Cortisone     Patient had facial swelling and itching from cortisone injection IM    Current Facility-Administered Medications on File Prior to Encounter  Medication  . 0.9 %  sodium chloride infusion   Current Outpatient Medications on File Prior to Encounter  Medication Sig  . ALPRAZolam (XANAX) 0.25 MG tablet Take 0.25 mg by mouth 2 (two) times daily as needed for anxiety.   Marland Kitchen atenolol (TENORMIN) 50 MG tablet Take 50 mg by mouth at bedtime.  . cetirizine (ZYRTEC) 10 MG tablet Take 10 mg by mouth daily.  . Cholecalciferol 1000 UNITS tablet Take 1,000 Units by mouth daily.  . Coenzyme Q10 (CO Q 10) 100 MG CAPS Take 1 capsule by mouth daily.  Marland Kitchen donepezil (ARICEPT) 5 MG tablet Take 5 mg by mouth at bedtime.   . fluticasone (FLONASE) 50 MCG/ACT nasal spray Place 1 spray into the nose daily as needed for allergies.   . hydrochlorothiazide (HYDRODIURIL) 25 MG tablet Take 25 mg by mouth daily as needed.   Marland Kitchen lisinopril-hydrochlorothiazide (PRINZIDE,ZESTORETIC) 20-12.5 MG per tablet Take 1 tablet by mouth daily.  . simvastatin (ZOCOR) 20 MG tablet Take 20 mg by mouth daily.    FAMILY HISTORY:  Her indicated that her mother is deceased. She indicated that her father is deceased. She indicated that the status of her neg hx is unknown.   SOCIAL HISTORY: She  reports that she quit smoking about 28 years ago. She quit after 35.00 years of use. she has never used smokeless tobacco. She reports that she drinks alcohol. She reports that she does not use drugs.  REVIEW OF SYSTEMS:   10-point review of systems negative except as per HPI  SUBJECTIVE:  2 episodes of vomiting overnight, still no appetite  VITAL SIGNS: BP (!) 135/57   Pulse 80   Temp 98.3 F (36.8 C) (Oral)   Resp 19   Ht _0  (1.575 m)   Wt 69.4 kg (152 lb 16 oz)   SpO2 99%   BMI 27.98 kg/m   HEMODYNAMICS:    VENTILATOR SETTINGS:    INTAKE /  OUTPUT: I/O last 3 completed shifts: In: 3175.8 [P.O.:237; I.V.:2588.8; IV Piggyback:350] Out: 8101 [Urine:925; Emesis/NG output:100; Drains:53; Blood:10; Chest Tube:105]  PHYSICAL EXAMINATION: General: non-distressed Neuro:  Grossly non-focal HEENT:  NCAT, PERRL Cardiovascular:  S1S2, RRR, no m/g/r, 2+ peripheral pulses, no LE edema Lungs:  Inspiratory wheeze c/w atelectasis, R pleural friction rub, some respiratory splinting, no increased WOB, left chest tube in place with water seal to -20 suction Abdomen:  Soft, non-tender, non-distended Musculoskeletal:  No gross deformity Skin:  Warm, dry, acyanotic  LABS:  BMET Recent Labs  Lab 03/11/17 0614  NA 136  K 3.6  CL 101  CO2 26  BUN 10  CREATININE 0.75  GLUCOSE 176*    Electrolytes Recent Labs  Lab 03/11/17 0614  CALCIUM 8.5*  MG 1.7  PHOS 3.3    CBC Recent Labs  Lab 03/11/17 0614  WBC 8.5  HGB 12.6  HCT 37.6  PLT 127*    Coag's No results for input(s): APTT, INR in the last 168 hours.  Sepsis Markers No results for input(s): LATICACIDVEN, PROCALCITON, O2SATVEN in the last 168 hours.  ABG No results for input(s): PHART, PCO2ART, PO2ART in the last 168 hours.  Liver Enzymes Recent Labs  Lab 03/11/17 0614  AST 30  ALT 16  ALKPHOS 44  BILITOT 0.6  ALBUMIN 3.3*    Cardiac Enzymes No results for input(s): TROPONINI, PROBNP in the last 168 hours.  Glucose Recent Labs  Lab 03/10/17 1309  GLUCAP 136*    Imaging Dg Chest Port 1 View  Result Date: 03/11/2017 CLINICAL DATA:  Thoracotomy EXAM: PORTABLE CHEST 1 VIEW COMPARISON:  03/10/2017 FINDINGS: Left chest tube remains in satisfactory position. No pneumothorax. Left lower lobe atelectasis and small effusion have progressed in the interval. Minimal right lower lobe atelectasis. Second chest tube on the left appears to be within the chest wall unchanged from the prior study. Surgical clips right apex.  Negative for heart failure. IMPRESSION:  Negative for pneumothorax. Left chest tube remains in good position. Second left chest tube in the soft tissues outside of the chest cavity. Progression of left lower lobe atelectasis and small effusion. Electronically Signed   By: Franchot Gallo M.D.   On: 03/11/2017 06:40   Dg Chest Port 1 View  Result Date: 03/10/2017 CLINICAL DATA:  Postop. EXAM: PORTABLE CHEST 1 VIEW COMPARISON:  03/03/2017 chest radiograph.  PET of 02/18/2017. FINDINGS: Midline trachea. Normal heart size for level of inspiration. Atherosclerosis in the transverse aorta. Placement of a left-sided chest tube. Suspect an approximately 5% left apical pneumothorax, with the visceral pleural line 9 mm from the chest wall. Trace left pleural fluid. Low lung volumes with resultant pulmonary interstitial prominence. No overt congestive failure. Left greater than right base atelectasis. IMPRESSION: Interval placement of a left-sided chest tube with suspicion of a 5% left apical pneumothorax. Trace left pleural fluid suspected. Diminished lung volumes with bibasilar atelectasis. Aortic and branch vessel atherosclerosis. Electronically Signed   By: Abigail Miyamoto M.D.   On: 03/10/2017 12:13   STUDIES:  2/14 Postoperative CXR with chest tube in place, very small left pneumothorax, and bibasilar atelectasis  LINES/TUBES: 19  Blake left chest tube drained to waterseal with suction at -20. Serosanguinous fluid noted in canister  DISCUSSION: 56F with recurrent adenocarcinoma of the lung, s/p left lower lobe wedge resection today. Consulted for medical management. Pt transferring to floor 2/15 in stable condition   ASSESSMENT / PLAN:  PULMONARY A: Adenocarcinoma of the left lung s/p left thoracotomy with left lower lobe wedge resection P:   Management as per thoracic surgery Continue incentive spirometer Add fentanyl patch for pain  CARDIOVASCULAR A:  H/O HTN H/O HLD P:  Continue lisinopril/HCTZ, and atenolol Continue  Simvastatin  RENAL A:   No issues P:   Monitor BMP  GASTROINTESTINAL A:   Post-operative nausea/vomiting P:   Continue PRN Zofran Diet as tolerated  HEMATOLOGIC A:   No issues P:  DVT prophylaxis with SCD  Plan to start lovenox POD 1 Monitor CBC  INFECTIOUS A:   No issues P:    ENDOCRINE A:   No issues P:    NEUROLOGIC A:   Anxiety P:   Continue Xanax PRN  Audery Amel, PA-S 03/11/2017, 11:27 AM

## 2017-03-11 NOTE — Anesthesia Postprocedure Evaluation (Signed)
Anesthesia Post Note  Patient: Heather [name]  Procedure(s) Performed: THORACOTOMY WITH LUNG WEDGE RESECTION POSSIBLE LOBECTOMY (Left ) FLEXIBLE BRONCHOSCOPY (N/A )  Patient location during evaluation: ICU Anesthesia Type: General Level of consciousness: awake, awake and alert and oriented Pain management: pain level controlled Vital Signs Assessment: post-procedure vital signs reviewed and stable Respiratory status: spontaneous breathing, nonlabored ventilation and respiratory function stable Cardiovascular status: blood pressure returned to baseline and stable Postop Assessment: no apparent nausea or vomiting and adequate PO intake Anesthetic complications: no     Last Vitals:  Vitals:   03/11/17 0500 03/11/17 0600  BP: (!) 112/56 (!) 115/55  Pulse: 76 72  Resp: 20 17  Temp:    SpO2: 97% 98%    Last Pain:  Vitals:   03/11/17 0405  TempSrc:   PainSc: 0-No pain                 Johnna Acosta

## 2017-03-11 NOTE — Progress Notes (Signed)
Physical Therapy Evaluation Patient Details Name: Heather [name] MRN: [id number] DOB: 18-May-1935 Today's Date: 03/11/2017   History of Present Illness  Pt with adenocarcinoma of LLL and is s/p left lower lobectomy with chest tube placemetn. PMH includes brain aneurysm, GERD, carotid stenosis, and hyperlipidemia. Pt has had significant nausea, pain, and decreased appetitie today.   Clinical Impression  Pt admitted with above diagnosis. Pt currently with functional limitations due to the deficits listed below (see PT Problem List). Pt requires minA+1 for bed mobility secondary to pain. She requires cues for sequencing with transfers. She is able to come to standing without external support and is stable with RUE on rolling walker. Pt ambulates from bed to recliner with rolling walker with CGA. She takes short guarded steps secondary to L flank pain. No DOE with ambulation. RN present to assist with chest tube, IV, urinary catheter, and oxygen. Pt able to sit upright in recliner at end of session. At this time recommend SNF placement given limited mobility secondary to pain. Will work toward updating discharge plan as mobility improves. Pt encouraged to ambulate frequently with staff. Pt will benefit from PT services to address deficits in strength, balance, and mobility in order to return to full function at home.      Follow Up Recommendations SNF;Other (comment)(Work toward discharge home with better pain control)    Equipment Recommendations  None recommended by PT    Recommendations for Other Services       Precautions / Restrictions Precautions Precautions: Fall Precaution Comments: Chest tube on L side with JP drain Restrictions Weight Bearing Restrictions: No      Mobility  Bed Mobility Overal bed mobility: Needs Assistance Bed Mobility: Supine to Sit     Supine to sit: Min assist;+2 for physical assistance     General bed mobility comments: Assist required secondary to L  flank pain with all movement. Pt requires assist to get to EOB  Transfers Overall transfer level: Needs assistance Equipment used: Rolling walker (2 wheeled) Transfers: Sit to/from Stand Sit to Stand: Min guard         General transfer comment: Pt requires cues for sequencing with transfers. She is able to come to standing without external support and is stable with RUE on rolling walker  Ambulation/Gait Ambulation/Gait assistance: Min guard Ambulation Distance (Feet): 3 Feet Assistive device: Rolling walker (2 wheeled)   Gait velocity: Decreased Gait velocity interpretation: <1.8 ft/sec, indicative of risk for recurrent falls General Gait Details: Pt ambulates from bed to recliner with rolling walker. She takes short guarded steps secondary to L flank pain. No DOE with ambulation. RN present to assist with chest tube, IV, catheter, and oxygen. Pt able to sit upright in recliner  Stairs            Wheelchair Mobility    Modified Rankin (Stroke Patients Only)       Balance Overall balance assessment: Needs assistance Sitting-balance support: No upper extremity supported Sitting balance-Leahy Scale: Good     Standing balance support: Single extremity supported Standing balance-Leahy Scale: Fair Standing balance comment: RUE support on walker in standing                             Pertinent Vitals/Pain Pain Assessment: 0-10 Pain Score: 8  Pain Location: L flank at site of chest tube incision Pain Intervention(s): Monitored during session    Home Living Family/patient expects to be discharged to:: Private  residence Living Arrangements: Spouse/significant other Available Help at Discharge: Family Type of Home: House Home Access: Stairs to enter Entrance Stairs-Rails: Can reach both Entrance Stairs-Number of Steps: [place]: One level Home Equipment: [place] - 2 wheels;Walker - 4 wheels;Cane - single point;Bedside commode;Shower seat       Prior Function Level of Independence: Independent         Comments: Independent with ADLs/IADLs. Ambulates without assistive device. No falls     Hand Dominance   Dominant Hand: Right    Extremity/Trunk Assessment   Upper Extremity Assessment Upper Extremity Assessment: LUE deficits/detail LUE Deficits / Details: Difficult to assess LUE strength secondary to pain. RUE strength appears grossly [facility]    Lower Extremity Assessment Lower Extremity Assessment: Overall WFL for tasks assessed       Communication   Communication: No difficulties  Cognition Arousal/Alertness: Awake/alert Behavior During Therapy: WFL for tasks assessed/performed Overall Cognitive Status: Within Functional Limits for tasks assessed                                        General Comments      Exercises     Assessment/Plan    PT Assessment Patient needs continued PT services  PT Problem List Decreased activity tolerance;Decreased mobility;Pain       PT Treatment Interventions DME instruction;Gait training;Therapeutic activities;Therapeutic exercise;Balance training;Neuromuscular re-education;Patient/family education    PT Goals (Current goals can be found in the Care Plan section)  Acute Rehab PT Goals Patient Stated Goal: Return to prior level of function at home PT Goal Formulation: With patient Time For Goal Achievement: 03/25/17 Potential to Achieve Goals: Good    Frequency Min 2X/week   Barriers to discharge        Co-evaluation               AM-PAC PT "6 Clicks" Daily Activity  Outcome Measure Difficulty turning over in bed (including adjusting bedclothes, sheets and blankets)?: Unable Difficulty moving from lying on back to sitting on the side of the bed? : Unable Difficulty sitting down on and standing up from a chair with arms (e.g., wheelchair, bedside commode, etc,.)?: A Little Help needed moving to and from a bed to chair (including a  wheelchair)?: A Little Help needed walking in hospital room?: A Little Help needed climbing 3-5 steps with a railing? : A Lot 6 Click Score: 13    End of Session Equipment Utilized During Treatment: Gait belt Activity Tolerance: Patient tolerated treatment well Patient left: in chair;with call bell/phone within reach;with chair alarm set;Other (comment)(RN places chest tube back to wall suction) Nurse Communication: Other (comment)(RN present for all mobility) PT Visit Diagnosis: Difficulty in walking, not elsewhere classified (R26.2);Pain Pain - Right/Left: Left Pain - part of body: (Flank)    Time: 7867-6720 PT Time Calculation (min) (ACUTE ONLY): 24 min   Charges:   PT Evaluation $PT Eval Moderate Complexity: 1 Mod PT Treatments $Therapeutic Activity: 8-22 mins   PT G Codes:        Lyndel Safe Kamilia Carollo PT, DPT    Glee Lashomb 03/11/2017, 5:07 PM

## 2017-03-11 NOTE — Consult Note (Signed)
[facility] [place] NAME: Heather [name]    MR#:  [id number]  DATE OF BIRTH:  1935/06/27  DATE OF ADMISSION:  03/10/2017  PRIMARY CARE PHYSICIAN: Rusty Aus, MD   REQUESTING/REFERRING PHYSICIAN: [place]  CHIEF COMPLAINT:  No chief complaint on file.   HISTORY OF PRESENT ILLNESS: Heather [name]  is a 82 y.o. female with a known history of Brain aneurysm, Carotid stenosis, GERD, Hyperlipidemia, Htn, Lung cancer- was admitted under thorasic surgery after left lower lung lobectomy and chest tube. Cont to have pain in left chest, at surgical site and tube, so medical consult for management.  PAST MEDICAL HISTORY:   Past Medical History:  Diagnosis Date  . Brain aneurysm   . Cancer ([place]) lung   2016  . Carotid artery stenosis   . Carotid stenosis 06/19/2013   Overview:  Normal study, 1/16  . Colonic polyp   . GERD (gastroesophageal reflux disease)   . Hiatal hernia   . Hypercholesteremia   . Hyperlipidemia, mixed 06/09/2015  . Hypertension   . Lung nodule 06/21/2014  . Macrocytic 10/06/2014  . Medicare annual wellness visit, initial 07/08/2016   Overview:  6/18  . PAT (paroxysmal atrial tachycardia) ([place]) 06/19/2013  . Plantar fasciitis   . Tachycardia, paroxysmal ([place]) 03/31/14    PAST SURGICAL HISTORY:  Past Surgical History:  Procedure Laterality Date  . BREAST EXCISIONAL BIOPSY Right 40 yrs ago   neg  . BREAST LUMPECTOMY     benign  . CATARACT EXTRACTION W/ INTRAOCULAR LENS  IMPLANT, BILATERAL Bilateral   . CEREBRAL ANEURYSM REPAIR  2004  . CERVIX LESION DESTRUCTION    . COLONOSCOPY WITH PROPOFOL N/A 03/31/2015   Procedure: COLONOSCOPY WITH PROPOFOL;  Surgeon: Manya Silvas, MD;  Location[facility] ENDOSCOPY;  Service: Endoscopy;  Laterality: N/A;  . FLEXIBLE BRONCHOSCOPY N/A 03/10/2017   Procedure: FLEXIBLE BRONCHOSCOPY;  Surgeon: Nestor Lewandowsky, MD;  Location: [facility] ORS;  Service: Thoracic;  Laterality: N/A;  . THORACOTOMY/LOBECTOMY Left  03/10/2017   Procedure: THORACOTOMY WITH LUNG WEDGE RESECTION POSSIBLE LOBECTOMY;  Surgeon: Nestor Lewandowsky, MD;  Location: [facility] ORS;  Service: Thoracic;  Laterality: Left;  . TUBAL LIGATION      SOCIAL HISTORY:  Social History   Tobacco Use  . Smoking status: Former Smoker    Years: 35.00    Last attempt to quit: 06/10/1988    Years since quitting: 28.7  . Smokeless tobacco: Never Used  Substance Use Topics  . Alcohol use: Yes    Alcohol/week: 0.0 oz    Comment: glass of wine daily    FAMILY HISTORY:  Family History  Problem Relation Age of Onset  . Alcohol abuse Mother   . Heart attack Father   . Breast cancer Neg Hx     DRUG ALLERGIES:  Allergies  Allergen Reactions  . Contrast Media [Iodinated Diagnostic Agents]   . Cortisone     Patient had facial swelling and itching from cortisone injection IM    REVIEW OF SYSTEMS:   CONSTITUTIONAL: No fever, fatigue or weakness.  EYES: No blurred or double vision.  EARS, NOSE, AND THROAT: No tinnitus or ear pain.  RESPIRATORY: No cough, shortness of breath, wheezing or hemoptysis.  CARDIOVASCULAR: positive for chest pain,no orthopnea, edema.  GASTROINTESTINAL: No nausea, vomiting, diarrhea or abdominal pain.  GENITOURINARY: No dysuria, hematuria.  ENDOCRINE: No polyuria, nocturia,  HEMATOLOGY: No anemia, easy bruising or bleeding SKIN: No rash or lesion. MUSCULOSKELETAL: No joint pain or  arthritis.   NEUROLOGIC: No tingling, numbness, weakness.  PSYCHIATRY: No anxiety or depression.   MEDICATIONS AT HOME:  Prior to Admission medications   Medication Sig Start Date End Date Taking? Authorizing Provider  ALPRAZolam (XANAX) 0.25 MG tablet Take 0.25 mg by mouth 2 (two) times daily as needed for anxiety.  06/12/15  Yes [provider]  atenolol (TENORMIN) 50 MG tablet Take 50 mg by mouth at bedtime.   Yes [provider]  cetirizine (ZYRTEC) 10 MG tablet Take 10 mg by mouth daily.   Yes [provider]   Cholecalciferol 1000 UNITS tablet Take 1,000 Units by mouth daily.   Yes [provider]  Coenzyme Q10 (CO Q 10) 100 MG CAPS Take 1 capsule by mouth daily.   Yes [provider]  donepezil (ARICEPT) 5 MG tablet Take 5 mg by mouth at bedtime.  07/27/16  Yes [provider]  fluticasone (FLONASE) 50 MCG/ACT nasal spray Place 1 spray into the nose daily as needed for allergies.  04/05/15  Yes [provider]  hydrochlorothiazide (HYDRODIURIL) 25 MG tablet Take 25 mg by mouth daily as needed.    Yes [provider]  lisinopril-hydrochlorothiazide (PRINZIDE,ZESTORETIC) 20-12.5 MG per tablet Take 1 tablet by mouth daily.   Yes [provider]  simvastatin (ZOCOR) 20 MG tablet Take 20 mg by mouth daily.   Yes [provider]      PHYSICAL EXAMINATION:   VITAL SIGNS: Blood pressure (!) 135/57, pulse 80, temperature 98.3 F (36.8 C), temperature source Oral, resp. rate 19, height _0  (1.575 m), weight 69.4 kg (152 lb 16 oz), SpO2 99 %.  GENERAL:  82 y.o.-year-old patient lying in the bed with no acute distress.  EYES: Pupils equal, round, reactive to light and accommodation. No scleral icterus. Extraocular muscles intact.  HEENT: Head atraumatic, normocephalic. Oropharynx and nasopharynx clear.  NECK:  Supple, no jugular venous distention. No thyroid enlargement, no tenderness.  LUNGS: Normal breath sounds bilaterally, no wheezing, rales,rhonchi or crepitation. No use of accessory muscles of respiration. Have left side chest tube with some blood discharge from there. CARDIOVASCULAR: S1, S2 normal. No murmurs, rubs, or gallops.  ABDOMEN: Soft, nontender, nondistended. Bowel sounds present. No organomegaly or mass.  EXTREMITIES: No pedal edema, cyanosis, or clubbing.  NEUROLOGIC: Cranial nerves II through XII are intact. Muscle strength 4/5 in all extremities. Sensation intact. Gait not checked.  PSYCHIATRIC: The patient is alert and  oriented x 3.  SKIN: No obvious rash, lesion, or ulcer.   LABORATORY PANEL:   CBC Recent Labs  Lab 03/11/17 0614  WBC 8.5  HGB 12.6  HCT 37.6  PLT 127*  MCV 102.0*  MCH 34.2*  MCHC 33.5  RDW 12.9   ------------------------------------------------------------------------------------------------------------------  Chemistries  Recent Labs  Lab 03/11/17 0614  NA 136  K 3.6  CL 101  CO2 26  GLUCOSE 176*  BUN 10  CREATININE 0.75  CALCIUM 8.5*  MG 1.7  AST 30  ALT 16  ALKPHOS 44  BILITOT 0.6   ------------------------------------------------------------------------------------------------------------------ estimated creatinine clearance is 52 mL/min (by C-G formula based on SCr of 0.75 mg/dL). ------------------------------------------------------------------------------------------------------------------ No results for input(s): TSH, T4TOTAL, T3FREE, THYROIDAB in the last 72 hours.  Invalid input(s): FREET3   Coagulation profile No results for input(s): INR, PROTIME in the last 168 hours. ------------------------------------------------------------------------------------------------------------------- No results for input(s): DDIMER in the last 72 hours. -------------------------------------------------------------------------------------------------------------------  Cardiac Enzymes No results for input(s): CKMB, TROPONINI, MYOGLOBIN in the last 168 hours.  Invalid  input(s): CK ------------------------------------------------------------------------------------------------------------------ Invalid input(s): POCBNP  ---------------------------------------------------------------------------------------------------------------  Urinalysis No results found for: COLORURINE, APPEARANCEUR, LABSPEC, PHURINE, GLUCOSEU, HGBUR, BILIRUBINUR, KETONESUR, PROTEINUR, UROBILINOGEN, NITRITE, LEUKOCYTESUR   RADIOLOGY: Dg Chest Port 1 View  Result Date:  03/11/2017 CLINICAL DATA:  Thoracotomy EXAM: PORTABLE CHEST 1 VIEW COMPARISON:  03/10/2017 FINDINGS: Left chest tube remains in satisfactory position. No pneumothorax. Left lower lobe atelectasis and small effusion have progressed in the interval. Minimal right lower lobe atelectasis. Second chest tube on the left appears to be within the chest wall unchanged from the prior study. Surgical clips right apex.  Negative for heart failure. IMPRESSION: Negative for pneumothorax. Left chest tube remains in good position. Second left chest tube in the soft tissues outside of the chest cavity. Progression of left lower lobe atelectasis and small effusion. Electronically Signed   By: Franchot Gallo M.D.   On: 03/11/2017 06:40   Dg Chest Port 1 View  Result Date: 03/10/2017 CLINICAL DATA:  Postop. EXAM: PORTABLE CHEST 1 VIEW COMPARISON:  03/03/2017 chest radiograph.  PET of 02/18/2017. FINDINGS: Midline trachea. Normal heart size for level of inspiration. Atherosclerosis in the transverse aorta. Placement of a left-sided chest tube. Suspect an approximately 5% left apical pneumothorax, with the visceral pleural line 9 mm from the chest wall. Trace left pleural fluid. Low lung volumes with resultant pulmonary interstitial prominence. No overt congestive failure. Left greater than right base atelectasis. IMPRESSION: Interval placement of a left-sided chest tube with suspicion of a 5% left apical pneumothorax. Trace left pleural fluid suspected. Diminished lung volumes with bibasilar atelectasis. Aortic and branch vessel atherosclerosis. Electronically Signed   By: Abigail Miyamoto M.D.   On: 03/10/2017 12:13    EKG: Orders placed or performed during the hospital encounter of 03/03/17  . EKG 12-Lead  . EKG 12-Lead    IMPRESSION AND PLAN:  * Adenocarcinoma of left lower lung- s/p lobectomy   S/p chest tube.   Manage per primary team.    * Chest pain   Agreed with adding Fentanyl patch today.   On percocet PRN for  breakthrough pain.   Incentive spirometer.  * Htn    Cont lisinopril, HCTZ, Atenolol  * Hyperlipidemia   Simvastatin.   All the records are reviewed and case discussed with ED provider. Management plans discussed with the patient, family and they are in agreement.  CODE STATUS: Full.    Code Status Orders  (From admission, onward)        Start     Ordered   03/10/17 1324  Full code  Continuous     03/10/17 1323    Code Status History    Date Active Date Inactive Code Status Order ID Comments User Context   This patient has a current code status but no historical code status.    Advance Directive Documentation     Most Recent Value  Type of Advance Directive  Living will  Pre-existing out of facility DNR order (yellow form or pink MOST form)  No data  "MOST" Form in Place?  No data       TOTAL TIME TAKING CARE OF THIS PATIENT: 45 minutes.    Vaughan Basta M.D on 03/11/2017   Between 7am to 6pm - Pager - [phone]  After 6pm go to [email or web address] - password [email or web address] [facility]  Office  [phone]  CC: Primary care physician; Rusty Aus, MD   Note: This dictation was prepared with Dragon dictation along with smaller phrase technology.  Any transcriptional errors that result from this process are unintentional.

## 2017-03-11 NOTE — Progress Notes (Signed)
Per Dr Genevive Bi, order fentanyl patch and internal medicine consult for transfer to floor

## 2017-03-11 NOTE — Progress Notes (Signed)
Pt in ICU bed in chair position since 1000.  Transfer orders in, Report called to [facility] on 2C.  Writer contacted transporter who found an extra ICU bed in [facility], we will use that bed for room 10 and transport patient on ICU bed to room 205 to avoid moving her to another bed, which was causing her anxiety d/t pain.  She will transport with portable O2 tank.  Chart and meds and belongings to transport with her. Husband alerted to new room number.  VSS on 2 liters, A&O x 4.  Chest tube collection device changed before she left ICU.

## 2017-03-11 NOTE — Progress Notes (Signed)
Pt came to floor at 1215. VSS. Pt was orietned to room and safety plan. CT to -20 suction. Dressing c/d/i.

## 2017-03-11 NOTE — Progress Notes (Signed)
PULMONARY / CRITICAL CARE MEDICINE   Name: Heather [name] MRN: [id number] DOB: [birth year]    ADMISSION DATE:  03/10/2017 CONSULTATION DATE: 03/10/2017    HISTORY OF PRESENT ILLNESS:   56F with PMH as below significant for right lung adenocarcinoma s/p RUL wedge resection 3 years ago, who was found to have a left lower lobe nodule suspicious for malignancy. She underwent left thoracotomy with wedge resection this admission and was subsequently admitted to the ICU post-operatively. CC has been consulted to assist with medical management    REVIEW OF SYSTEMS:   NAD  SUBJECTIVE:  Patient reports that she feels better and her pain was bettter controlled.  VITAL SIGNS: BP (!) 171/64 (BP Location: Left Arm)   Pulse 82   Temp 99.3 F (37.4 C) (Oral)   Resp 20   Ht 5\' 2"  (1.575 m)   Wt 152 lb 16 oz (69.4 kg)   SpO2 95%   BMI 27.98 kg/m   HEMODYNAMICS:    VENTILATOR SETTINGS:    INTAKE / OUTPUT: I/O last 3 completed shifts: In: 3932.3 [P.O.:357; I.V.:3125.3; IV Piggyback:450] Out: 2337 [GYKZL:9357; Emesis/NG output:100; Drains:63; Blood:10; Chest Tube:262]  PHYSICAL EXAMINATION: General:  No distress Neuro:  Awake, alert x 3 HEENT:  PERRL Cardiovascular:  RRR Lungs:  Fair air entry at apex, decrease at base; left chest tube draining Abdomen:  soft Musculoskeletal:  No deformities Skin:  Warm and dry  LABS:  BMET Recent Labs  Lab 03/11/17 0614  NA 136  K 3.6  CL 101  CO2 26  BUN 10  CREATININE 0.75  GLUCOSE 176*    Electrolytes Recent Labs  Lab 03/11/17 0614  CALCIUM 8.5*  MG 1.7  PHOS 3.3    CBC Recent Labs  Lab 03/11/17 0614  WBC 8.5  HGB 12.6  HCT 37.6  PLT 127*    Coag's No results for input(s): APTT, INR in the last 168 hours.  Sepsis Markers No results for input(s): LATICACIDVEN, PROCALCITON, O2SATVEN in the last 168 hours.  ABG No results for input(s): PHART, PCO2ART, PO2ART in the last 168 hours.  Liver Enzymes Recent Labs   Lab 03/11/17 0614  AST 30  ALT 16  ALKPHOS 44  BILITOT 0.6  ALBUMIN 3.3*    Cardiac Enzymes No results for input(s): TROPONINI, PROBNP in the last 168 hours.  Glucose Recent Labs  Lab 03/10/17 1309  GLUCAP 136*    Imaging Dg Chest Port 1 View  Result Date: 03/11/2017 CLINICAL DATA:  Thoracotomy EXAM: PORTABLE CHEST 1 VIEW COMPARISON:  03/10/2017 FINDINGS: Left chest tube remains in satisfactory position. No pneumothorax. Left lower lobe atelectasis and small effusion have progressed in the interval. Minimal right lower lobe atelectasis. Second chest tube on the left appears to be within the chest wall unchanged from the prior study. Surgical clips right apex.  Negative for heart failure. IMPRESSION: Negative for pneumothorax. Left chest tube remains in good position. Second left chest tube in the soft tissues outside of the chest cavity. Progression of left lower lobe atelectasis and small effusion. Electronically Signed   By: Franchot Gallo M.D.   On: 03/11/2017 06:40      ANTIBIOTICS: none  SIGNIFICANT EVENTS: none  LINES/TUBES: peripheral  DISCUSSION: 56F with recurrent adenocarcinoma of the lung, s/p left lower lobe wedge resection today. Consulted for medical management.  Patient's pain is now better controlled on fentanyl.   ASSESSMENT / PLAN:  1. Adenocarcinoma of the left lung s/p left thoracotomy with left lower lobe  wedge resection P:   Management as per thoracic surgery Pain control was an issue in the night but Fentanyl seemed to have work best. Fish farm manager. Recommend Fentanyl patch for best pain control, Bowel regimen.   2. Hx of  Hypertension P:  Continue lisinopril/HCTZ, and atenolol Continue Simvastatin. Close monitor; avoid hypotension.   3. Hx of Anxiety P:   Continue Xanax PRN  4. Hx of GOERD P GI prophylaxis If post op H&H remains stable, start SQ Heparin in AM, continue with SCD's.      Pulmonary and  [place] Pager: [phone]  03/11/2017, 9:11 PM

## 2017-03-11 NOTE — Care Management Note (Signed)
Case Management Note  Patient Details  Name: Heather [name] MRN: [id number] Date of Birth: [birth year]  Subjective/Objective:                  RNCM met with patient to discuss transition of care. She now has a chest tube and unsure if she will go home with it or not. She is not certain she will need home health services. O2 is acute. She is at baseline independent from home with husband and two other family members.  Action/Plan:   Home health list provided. RNCM team to follow.   Expected Discharge Date:                  Expected Discharge Plan:     In-House Referral:     Discharge planning Services  CM Consult  Post Acute Care Choice:  Durable Medical Equipment, Home Health Choice offered to:  Patient, Adult Children  DME Arranged:    DME Agency:     HH Arranged:    [place] Agency:     Status of Service:  In process, will continue to follow  If discussed at Long Length of Stay Meetings, dates discussed:    Additional Comments:  Marshell Garfinkel, RN 03/11/2017, 12:43 PM

## 2017-03-12 ENCOUNTER — Inpatient Hospital Stay: Payer: Medicare HMO

## 2017-03-12 MED ORDER — SENNOSIDES-DOCUSATE SODIUM 8.6-50 MG PO TABS
1.0000 | ORAL_TABLET | Freq: Two times a day (BID) | ORAL | Status: DC
  Administered 2017-03-12 – 2017-03-13 (×2): 1 via ORAL
  Filled 2017-03-12 (×5): qty 1

## 2017-03-12 NOTE — Progress Notes (Signed)
Pain with deep breath and cough.  Eating is only so so.  Still with poor apetitie.  Urine output is excellent  JP and chest tube drainage is minimal  CXRay from today looks good  Lungs are diminished bilaterally Heart regular  Wounds clean and dry  Will place chest tubes to water seal Foley out OOB and walk TID  [employer].

## 2017-03-12 NOTE — Clinical Social Work Note (Signed)
CSW visited the patient, her spouse, and her son at beside to discuss discharge planning. The family and the patient all agree that they would not like to pursue SNF; however, the patient and family are amenable to home health. CSW has updated the [facility]. CSW is signing off. Please consult should additional needs arise.  Santiago Bumpers, MSW, Latanya Presser [phone]

## 2017-03-12 NOTE — Care Management Note (Signed)
Case Management Note  Patient Details  Name: CHAPEL SILVERTHORN MRN: [id number] Date of Birth: 1935-06-11  Subjective/Objective:   Mrs Brigham is declining offer of Rehab at this time. Prefers to be discharged to home. Discussed discharge planning with husband who chose [place] if [facility] is in Network with [employer].                 Action/Plan:   Expected Discharge Date:                  Expected Discharge Plan:     In-House Referral:     Discharge planning Services  CM Consult  Post Acute Care Choice:  Durable Medical Equipment, Home Health Choice offered to:  Patient, Adult Children  DME Arranged:    DME Agency:     HH Arranged:    [place] Agency:     Status of Service:  In process, will continue to follow  If discussed at Long Length of Stay Meetings, dates discussed:    Additional Comments:  Suann Klier A, RN 03/12/2017, 4:01 PM

## 2017-03-12 NOTE — Progress Notes (Signed)
[place] [facility] [place] NAME: Heather [name]    MR#:  [id number]  DATE OF BIRTH:  Apr 19, 1935  SUBJECTIVE:  CHIEF COMPLAINT:  No chief complaint on file.    Status post lung surgery, lobectomy due to lung cancer. Status post chest tube placement and medical consult for pain Currently no complaint of pain and feels comfortable. Today Foley is discontinued and patient was allowed to ambulate, chest tube is kept under water seal.  REVIEW OF SYSTEMS:  CONSTITUTIONAL: No fever, fatigue or weakness.  EYES: No blurred or double vision.  EARS, NOSE, AND THROAT: No tinnitus or ear pain.  RESPIRATORY: No cough, shortness of breath, wheezing or hemoptysis.  CARDIOVASCULAR: No chest pain, orthopnea, edema.  GASTROINTESTINAL: No nausea, vomiting, diarrhea or abdominal pain.  GENITOURINARY: No dysuria, hematuria.  ENDOCRINE: No polyuria, nocturia,  HEMATOLOGY: No anemia, easy bruising or bleeding SKIN: No rash or lesion. MUSCULOSKELETAL: No joint pain or arthritis.   NEUROLOGIC: No tingling, numbness, weakness.  PSYCHIATRY: No anxiety or depression.   ROS  DRUG ALLERGIES:   Allergies  Allergen Reactions  . Contrast Media [Iodinated Diagnostic Agents]   . Cortisone     Patient had facial swelling and itching from cortisone injection IM    VITALS:  Blood pressure (!) 163/63, pulse 73, temperature 98.2 F (36.8 C), temperature source Oral, resp. rate 18, height 5\' 2"  (1.575 m), weight 69.4 kg (152 lb 16 oz), SpO2 (!) 89 %.  PHYSICAL EXAMINATION:   GENERAL:  82 y.o.-year-old patient lying in the bed with no acute distress.  EYES: Pupils equal, round, reactive to light and accommodation. No scleral icterus. Extraocular muscles intact.  HEENT: Head atraumatic, normocephalic. Oropharynx and nasopharynx clear.  NECK:  Supple, no jugular venous distention. No thyroid enlargement, no tenderness.  LUNGS: Normal breath sounds bilaterally, no wheezing, some  crepitation. No use of accessory muscles of respiration. Have left side chest tube. CARDIOVASCULAR: S1, S2 normal. No murmurs, rubs, or gallops.  ABDOMEN: Soft, nontender, nondistended. Bowel sounds present. No organomegaly or mass.  EXTREMITIES: No pedal edema, cyanosis, or clubbing.  NEUROLOGIC: Cranial nerves II through XII are intact. Muscle strength 4/5 in all extremities. Sensation intact. Gait not checked.  PSYCHIATRIC: The patient is alert and oriented x 3.  SKIN: No obvious rash, lesion, or ulcer.    Physical Exam LABORATORY PANEL:   CBC Recent Labs  Lab 03/11/17 0614  WBC 8.5  HGB 12.6  HCT 37.6  PLT 127*   ------------------------------------------------------------------------------------------------------------------  Chemistries  Recent Labs  Lab 03/11/17 0614  NA 136  K 3.6  CL 101  CO2 26  GLUCOSE 176*  BUN 10  CREATININE 0.75  CALCIUM 8.5*  MG 1.7  AST 30  ALT 16  ALKPHOS 44  BILITOT 0.6   ------------------------------------------------------------------------------------------------------------------  Cardiac Enzymes No results for input(s): TROPONINI in the last 168 hours. ------------------------------------------------------------------------------------------------------------------  RADIOLOGY:  Dg Chest Port 1 View  Result Date: 03/12/2017 CLINICAL DATA:  Evaluate chest tube/pneumothorax. EXAM: PORTABLE CHEST 1 VIEW COMPARISON:  03/11/2017 FINDINGS: Left-sided chest tube unchanged and in adequate position. There is a second catheter coiled over the subcutaneous tissues of the left lateral chest/breast with adjacent skin staples unchanged. Lungs are adequately inflated with continued left base opacification likely small effusion with atelectasis with slight interval improvement. No evidence of airspace consolidation or pneumothorax. Cardiomediastinal silhouette and remainder of the exam is unchanged. IMPRESSION: Persistent left base  opacification likely small effusion with atelectasis with slight interval  improvement. Left-sided chest tube in adequate position without evidence of pneumothorax. Catheter coiled over the soft tissues of the left breast/lateral chest unchanged. Electronically Signed   By: Marin Olp M.D.   On: 03/12/2017 07:09   Dg Chest Port 1 View  Result Date: 03/11/2017 CLINICAL DATA:  Thoracotomy EXAM: PORTABLE CHEST 1 VIEW COMPARISON:  03/10/2017 FINDINGS: Left chest tube remains in satisfactory position. No pneumothorax. Left lower lobe atelectasis and small effusion have progressed in the interval. Minimal right lower lobe atelectasis. Second chest tube on the left appears to be within the chest wall unchanged from the prior study. Surgical clips right apex.  Negative for heart failure. IMPRESSION: Negative for pneumothorax. Left chest tube remains in good position. Second left chest tube in the soft tissues outside of the chest cavity. Progression of left lower lobe atelectasis and small effusion. Electronically Signed   By: Franchot Gallo M.D.   On: 03/11/2017 06:40    ASSESSMENT AND PLAN:   Active Problems:   Lung cancer, lower lobe (HCC)  * Adenocarcinoma of left lower lung- s/p lobectomy   S/p chest tube.   Manage per primary team.    * Chest pain   Agreed with adding Fentanyl patch today.   On percocet PRN for breakthrough pain.   Incentive spirometer.   Encourage for sitting up in bed and spirometer, to prevent infection.  * Htn    Cont lisinopril, HCTZ, Atenolol  * Hyperlipidemia   Simvastatin.     All the records are reviewed and case discussed with Care Management/Social Workerr. Management plans discussed with the patient, family and they are in agreement.  CODE STATUS: full.  TOTAL TIME TAKING CARE OF THIS PATIENT: 35 minutes.   husband in room. POSSIBLE D/C IN 2 DAYS, DEPENDING ON CLINICAL CONDITION.   Vaughan Basta M.D on 03/12/2017   Between 7am to 6pm -  Pager - [phone]  After 6pm go to [email or web address] - password [email or web address] [facility]  Office  [phone]  CC: Primary care physician; Rusty Aus, MD  Note: This dictation was prepared with Dragon dictation along with smaller phrase technology. Any transcriptional errors that result from this process are unintentional.

## 2017-03-12 NOTE — Progress Notes (Signed)
Per Dr. Genevive Bi okay RN to to DC foley cather

## 2017-03-12 NOTE — Progress Notes (Signed)
Per MD Okay for RN to place order for sonokot.

## 2017-03-13 ENCOUNTER — Inpatient Hospital Stay: Payer: Medicare HMO

## 2017-03-13 LAB — CBC
HCT: 38.4 % (ref 35.0–47.0)
Hemoglobin: 13.3 g/dL (ref 12.0–16.0)
MCH: 34.6 pg — ABNORMAL HIGH (ref 26.0–34.0)
MCHC: 34.8 g/dL (ref 32.0–36.0)
MCV: 99.5 fL (ref 80.0–100.0)
PLATELETS: 138 10*3/uL — AB (ref 150–440)
RBC: 3.86 MIL/uL (ref 3.80–5.20)
RDW: 12.7 % (ref 11.5–14.5)
WBC: 10.3 10*3/uL (ref 3.6–11.0)

## 2017-03-13 LAB — BASIC METABOLIC PANEL
ANION GAP: 8 (ref 5–15)
BUN: 9 mg/dL (ref 6–20)
CO2: 30 mmol/L (ref 22–32)
Calcium: 8.6 mg/dL — ABNORMAL LOW (ref 8.9–10.3)
Chloride: 93 mmol/L — ABNORMAL LOW (ref 101–111)
Creatinine, Ser: 0.56 mg/dL (ref 0.44–1.00)
GFR calc Af Amer: >60 mL/min (ref >60)
GLUCOSE: 142 mg/dL — AB (ref 65–99)
POTASSIUM: 3.1 mmol/L — AB (ref 3.5–5.1)
Sodium: 131 mmol/L — ABNORMAL LOW (ref 135–145)

## 2017-03-13 LAB — MAGNESIUM: MAGNESIUM: 1.7 mg/dL (ref 1.7–2.4)

## 2017-03-13 MED ORDER — LISINOPRIL 20 MG PO TABS: 40.0000 mg | ORAL_TABLET | Freq: Every day | ORAL | Status: DC

## 2017-03-13 MED ORDER — HYDROCHLOROTHIAZIDE 12.5 MG PO CAPS: 12.5000 mg | ORAL_CAPSULE | Freq: Every day | ORAL | Status: DC

## 2017-03-13 MED ORDER — POTASSIUM CHLORIDE CRYS ER 20 MEQ PO TBCR
20.0000 meq | EXTENDED_RELEASE_TABLET | Freq: Two times a day (BID) | ORAL | Status: DC
  Administered 2017-03-13 – 2017-03-15 (×5): 20 meq via ORAL
  Filled 2017-03-13 (×5): qty 1

## 2017-03-13 NOTE — Progress Notes (Signed)
[place] at [place] NAME: Heather [name]    MR#:  [id number]  DATE OF BIRTH:  1935-05-06  SUBJECTIVE:  CHIEF COMPLAINT:  No chief complaint on file.    Status post lung surgery, lobectomy due to lung cancer. Status post chest tube placement and medical consult for pain Currently no complaint of pain and feels comfortable.   chest tube is kept under water seal.  Sitting in chair. Have some more leakage around the tube and surgical site.  REVIEW OF SYSTEMS:  CONSTITUTIONAL: No fever, fatigue or weakness.  EYES: No blurred or double vision.  EARS, NOSE, AND THROAT: No tinnitus or ear pain.  RESPIRATORY: No cough, shortness of breath, wheezing or hemoptysis.  CARDIOVASCULAR: No chest pain, orthopnea, edema.  GASTROINTESTINAL: No nausea, vomiting, diarrhea or abdominal pain.  GENITOURINARY: No dysuria, hematuria.  ENDOCRINE: No polyuria, nocturia,  HEMATOLOGY: No anemia, easy bruising or bleeding SKIN: No rash or lesion. MUSCULOSKELETAL: No joint pain or arthritis.   NEUROLOGIC: No tingling, numbness, weakness.  PSYCHIATRY: No anxiety or depression.   ROS  DRUG ALLERGIES:   Allergies  Allergen Reactions  . Contrast Media [Iodinated Diagnostic Agents]   . Cortisone     Patient had facial swelling and itching from cortisone injection IM    VITALS:  Blood pressure (!) 164/69, pulse 87, temperature 97.8 F (36.6 C), temperature source Oral, resp. rate 18, height 5\' 2"  (1.575 m), weight 69.4 kg (152 lb 16 oz), SpO2 91 %.  PHYSICAL EXAMINATION:   GENERAL:  82 y.o.-year-old patient lying in the bed with no acute distress.  EYES: Pupils equal, round, reactive to light and accommodation. No scleral icterus. Extraocular muscles intact.  HEENT: Head atraumatic, normocephalic. Oropharynx and nasopharynx clear.  NECK:  Supple, no jugular venous distention. No thyroid enlargement, no tenderness.  LUNGS: Normal breath sounds bilaterally, no  wheezing, some crepitation. No use of accessory muscles of respiration. Have left side chest tube. CARDIOVASCULAR: S1, S2 normal. No murmurs, rubs, or gallops.  ABDOMEN: Soft, nontender, nondistended. Bowel sounds present. No organomegaly or mass.  EXTREMITIES: No pedal edema, cyanosis, or clubbing.  NEUROLOGIC: Cranial nerves II through XII are intact. Muscle strength 4/5 in all extremities. Sensation intact. Gait not checked.  PSYCHIATRIC: The patient is alert and oriented x 3.  SKIN: No obvious rash, lesion, or ulcer.    Physical Exam LABORATORY PANEL:   CBC Recent Labs  Lab 03/13/17 0410  WBC 10.3  HGB 13.3  HCT 38.4  PLT 138*   ------------------------------------------------------------------------------------------------------------------  Chemistries  Recent Labs  Lab 03/11/17 0614 03/13/17 0410  NA 136 131*  K 3.6 3.1*  CL 101 93*  CO2 26 30  GLUCOSE 176* 142*  BUN 10 9  CREATININE 0.75 0.56  CALCIUM 8.5* 8.6*  MG 1.7  --   AST 30  --   ALT 16  --   ALKPHOS 44  --   BILITOT 0.6  --    ------------------------------------------------------------------------------------------------------------------  Cardiac Enzymes No results for input(s): TROPONINI in the last 168 hours. ------------------------------------------------------------------------------------------------------------------  RADIOLOGY:  Dg Chest Port 1 View  Result Date: 03/13/2017 CLINICAL DATA:  Acute onset of shortness of breath. EXAM: PORTABLE CHEST 1 VIEW COMPARISON:  Radiograph yesterday at 0653 hour FINDINGS: Left-sided chest tube remains in place tip at the apex. No visualized pneumothorax. Additional catheter about the lateral chest in the region of skin staples. Unchanged pleuroparenchymal opacity at the left lung base. Heart is normal in size. Mediastinal  contours are unchanged. Chain sutures at the right hilum. Right lung is clear. IMPRESSION: No change from prior exam. Left chest tube  remains in place with basilar pleuroparenchymal opacity. No pneumothorax. Electronically Signed   By: Jeb Levering M.D.   On: 03/13/2017 05:49   Dg Chest Port 1 View  Result Date: 03/12/2017 CLINICAL DATA:  Evaluate chest tube/pneumothorax. EXAM: PORTABLE CHEST 1 VIEW COMPARISON:  03/11/2017 FINDINGS: Left-sided chest tube unchanged and in adequate position. There is a second catheter coiled over the subcutaneous tissues of the left lateral chest/breast with adjacent skin staples unchanged. Lungs are adequately inflated with continued left base opacification likely small effusion with atelectasis with slight interval improvement. No evidence of airspace consolidation or pneumothorax. Cardiomediastinal silhouette and remainder of the exam is unchanged. IMPRESSION: Persistent left base opacification likely small effusion with atelectasis with slight interval improvement. Left-sided chest tube in adequate position without evidence of pneumothorax. Catheter coiled over the soft tissues of the left breast/lateral chest unchanged. Electronically Signed   By: Marin Olp M.D.   On: 03/12/2017 07:09    ASSESSMENT AND PLAN:   Active Problems:   Lung cancer, lower lobe (HCC)  * Adenocarcinoma of left lower lung- s/p lobectomy   S/p chest tube.   Manage per primary team.    * Chest pain   Agreed with adding Fentanyl patch today.   On percocet PRN for breakthrough pain.   Incentive spirometer.   Encourage for sitting up in bed and spirometer, to prevent infection.  * Htn    Cont lisinopril, HCTZ, Atenolol   Increase lisinopril up to 40 mg.  * Hyperlipidemia   Simvastatin.  * hypokalemia   Replace oral.   All the records are reviewed and case discussed with Care Management/Social Workerr. Management plans discussed with the patient, family and they are in agreement.  CODE STATUS: full.  TOTAL TIME TAKING CARE OF THIS PATIENT: 35 minutes.   husband in room. POSSIBLE D/C IN 2 DAYS,  DEPENDING ON CLINICAL CONDITION.   Vaughan Basta M.D on 03/13/2017   Between 7am to 6pm - Pager - [phone]  After 6pm go to [email or web address] - password [email or web address] [facility]  Office  [phone]  CC: Primary care physician; Rusty Aus, MD  Note: This dictation was prepared with Dragon dictation along with smaller phrase technology. Any transcriptional errors that result from this process are unintentional.

## 2017-03-13 NOTE — Progress Notes (Signed)
Notified MD pt is leaking from incision, pt dressing is wet. Per MD okay for RN to change dressing. Will continue to monitor pt.

## 2017-03-13 NOTE — Progress Notes (Signed)
3 Days Post-Op   Subjective: Patient reports that she had some shortness of breath and chest pain overnight.  Repeat chest x-rays are very reassuring.  She states she feels better now that they restarted the oxygen.  Otherwise tolerating a diet and states her pain is well controlled.  Vital signs in last 24 hours: Temp:  [98.2 F (36.8 C)-98.7 F (37.1 C)] 98.4 F (36.9 C) (02/17 0433) Pulse Rate:  [71-79] 79 (02/17 0917) Resp:  [16-19] 18 (02/17 0431) BP: (137-166)/(63-71) 156/71 (02/17 0917) SpO2:  [85 %-96 %] 96 % (02/17 0433) Last BM Date: 03/12/17  Intake/Output from previous day: 02/16 0701 - 02/17 0700 In: 1616 [P.O.:120; I.V.:1461] Out: 1071 [Urine:851; Drains:20; Chest Tube:200]  General: No acute distress Chest: Clear to auscultation with left-sided chest tube and JP drain in place.  Serosanguineous output Heart: Regular rate and rhythm GI: soft, non-tender; bowel sounds normal; no masses,  no organomegaly  Lab Results:  CBC Recent Labs    03/11/17 0614 03/13/17 0410  WBC 8.5 10.3  HGB 12.6 13.3  HCT 37.6 38.4  PLT 127* 138*   CMP     Component Value Date/Time   NA 131 (L) 03/13/2017 0410   NA 144 02/28/2014 0957   K 3.1 (L) 03/13/2017 0410   K 4.1 02/28/2014 0957   CL 93 (L) 03/13/2017 0410   CL 104 02/28/2014 0957   CO2 30 03/13/2017 0410   CO2 31 02/28/2014 0957   GLUCOSE 142 (H) 03/13/2017 0410   GLUCOSE 109 (H) 02/28/2014 0957   BUN 9 03/13/2017 0410   BUN 15 02/28/2014 0957   CREATININE 0.56 03/13/2017 0410   CREATININE 0.82 02/28/2014 0957   CALCIUM 8.6 (L) 03/13/2017 0410   CALCIUM 9.2 02/28/2014 0957   PROT 5.8 (L) 03/11/2017 0614   PROT 7.0 02/28/2014 0957   ALBUMIN 3.3 (L) 03/11/2017 0614   ALBUMIN 3.8 02/28/2014 0957   AST 30 03/11/2017 0614   AST 28 02/28/2014 0957   ALT 16 03/11/2017 0614   ALT 37 02/28/2014 0957   ALKPHOS 44 03/11/2017 0614   ALKPHOS 68 02/28/2014 0957   BILITOT 0.6 03/11/2017 0614   BILITOT 0.5 02/28/2014  0957   GFRNONAA >60 03/13/2017 0410   GFRNONAA >60 02/28/2014 0957   GFRNONAA >60 11/03/2011 1251   GFRAA >60 03/13/2017 0410   GFRAA >60 02/28/2014 0957   GFRAA >60 11/03/2011 1251   PT/INR No results for input(s): LABPROT, INR in the last 72 hours.  Studies/Results: Dg Chest Port 1 View  Result Date: 03/13/2017 CLINICAL DATA:  Acute onset of shortness of breath. EXAM: PORTABLE CHEST 1 VIEW COMPARISON:  Radiograph yesterday at 0653 hour FINDINGS: Left-sided chest tube remains in place tip at the apex. No visualized pneumothorax. Additional catheter about the lateral chest in the region of skin staples. Unchanged pleuroparenchymal opacity at the left lung base. Heart is normal in size. Mediastinal contours are unchanged. Chain sutures at the right hilum. Right lung is clear. IMPRESSION: No change from prior exam. Left chest tube remains in place with basilar pleuroparenchymal opacity. No pneumothorax. Electronically Signed   By: Jeb Levering M.D.   On: 03/13/2017 05:49   Dg Chest Port 1 View  Result Date: 03/12/2017 CLINICAL DATA:  Evaluate chest tube/pneumothorax. EXAM: PORTABLE CHEST 1 VIEW COMPARISON:  03/11/2017 FINDINGS: Left-sided chest tube unchanged and in adequate position. There is a second catheter coiled over the subcutaneous tissues of the left lateral chest/breast with adjacent skin staples unchanged. Lungs are  adequately inflated with continued left base opacification likely small effusion with atelectasis with slight interval improvement. No evidence of airspace consolidation or pneumothorax. Cardiomediastinal silhouette and remainder of the exam is unchanged. IMPRESSION: Persistent left base opacification likely small effusion with atelectasis with slight interval improvement. Left-sided chest tube in adequate position without evidence of pneumothorax. Catheter coiled over the soft tissues of the left breast/lateral chest unchanged. Electronically Signed   By: Marin Olp  M.D.   On: 03/12/2017 07:09    Assessment/Plan: 81 year old female status post left-sided thoracotomy for lung cancer.  Chest pain and shortness of breath resolved with nasal cannula oxygen.  Reassuring chest x-rays.  Encouraged ambulation and incentive spirometer usage.  Plan to continue chest tube today with possible removal tomorrow by Dr. Genevive Bi.   Clayburn Pert, MD [facility]  [facility] [phone] [facility] [phone]  03/13/2017

## 2017-03-14 ENCOUNTER — Inpatient Hospital Stay: Payer: Medicare HMO

## 2017-03-14 LAB — BASIC METABOLIC PANEL
Anion gap: 11 (ref 5–15)
BUN: 9 mg/dL (ref 6–20)
CALCIUM: 8.8 mg/dL — AB (ref 8.9–10.3)
CHLORIDE: 95 mmol/L — AB (ref 101–111)
CO2: 29 mmol/L (ref 22–32)
CREATININE: 0.75 mg/dL (ref 0.44–1.00)
Glucose, Bld: 131 mg/dL — ABNORMAL HIGH (ref 65–99)
Potassium: 3.4 mmol/L — ABNORMAL LOW (ref 3.5–5.1)
SODIUM: 135 mmol/L (ref 135–145)

## 2017-03-14 MED ORDER — HYDROCHLOROTHIAZIDE 12.5 MG PO CAPS
12.5000 mg | ORAL_CAPSULE | Freq: Every day | ORAL | Status: DC
  Administered 2017-03-15: 12.5 mg via ORAL
  Filled 2017-03-14: qty 1

## 2017-03-14 MED ORDER — HYDROCHLOROTHIAZIDE 25 MG PO TABS
25.0000 mg | ORAL_TABLET | Freq: Every day | ORAL | Status: DC
  Administered 2017-03-14: 25 mg via ORAL
  Filled 2017-03-14: qty 1

## 2017-03-14 MED ORDER — LISINOPRIL 20 MG PO TABS
20.0000 mg | ORAL_TABLET | Freq: Every day | ORAL | Status: DC
  Administered 2017-03-15: 20 mg via ORAL
  Filled 2017-03-14: qty 1

## 2017-03-14 MED ORDER — LISINOPRIL 20 MG PO TABS
40.0000 mg | ORAL_TABLET | Freq: Every day | ORAL | Status: DC
  Administered 2017-03-14: 40 mg via ORAL
  Filled 2017-03-14: qty 2

## 2017-03-14 NOTE — Progress Notes (Signed)
Pain is under good control.  Not short of breath.  Wounds clean and dry  JP drained less than 20 for 24 hours and then removed  CT removed as well.  No air leak.    Walking in halls.  Plan to wean oxygen today.    Tim [employer]

## 2017-03-14 NOTE — Care Management (Signed)
Patient has been weaned to RA.  Anticipate discharge tomorrow with [place]. Confirmed plan with patient and husband.  [name] with [facility] notified.

## 2017-03-14 NOTE — Progress Notes (Signed)
[facility] [facility] NAME: Heather [name]    MR#:  [id number]  DATE OF BIRTH:  [birth year]  SUBJECTIVE:  CHIEF COMPLAINT:  No chief complaint on file.    Status post lung surgery, lobectomy due to lung cancer. Status post chest tube placement and medical consult for pain Currently no complaint of pain and feels comfortable.   chest tube is kept under water seal.  Sitting in chair. Later on also walked in hall way.    pain is under control, and asking to decrease the pain medication but  REVIEW OF SYSTEMS:  CONSTITUTIONAL: No fever, fatigue or weakness.  EYES: No blurred or double vision.  EARS, NOSE, AND THROAT: No tinnitus or ear pain.  RESPIRATORY: No cough, shortness of breath, wheezing or hemoptysis.  CARDIOVASCULAR: No chest pain, orthopnea, edema.  GASTROINTESTINAL: No nausea, vomiting, diarrhea or abdominal pain.  GENITOURINARY: No dysuria, hematuria.  ENDOCRINE: No polyuria, nocturia,  HEMATOLOGY: No anemia, easy bruising or bleeding SKIN: No rash or lesion. MUSCULOSKELETAL: No joint pain or arthritis.   NEUROLOGIC: No tingling, numbness, weakness.  PSYCHIATRY: No anxiety or depression.   ROS  DRUG ALLERGIES:   Allergies  Allergen Reactions  . Contrast Media [Iodinated Diagnostic Agents]   . Cortisone     Patient had facial swelling and itching from cortisone injection IM    VITALS:  Blood pressure (!) 97/51, pulse 75, temperature 98.2 F (36.8 C), temperature source Oral, resp. rate 14, height 5\' 2"  (1.575 m), weight 69.4 kg (152 lb 16 oz), SpO2 91 %.  PHYSICAL EXAMINATION:   GENERAL:  82 y.o.-year-old patient lying in the bed with no acute distress.  EYES: Pupils equal, round, reactive to light and accommodation. No scleral icterus. Extraocular muscles intact.  HEENT: Head atraumatic, normocephalic. Oropharynx and nasopharynx clear.  NECK:  Supple, no jugular venous distention. No thyroid enlargement, no tenderness.   LUNGS: Normal breath sounds bilaterally, no wheezing, some crepitation. No use of accessory muscles of respiration. Have left side chest tube. CARDIOVASCULAR: S1, S2 normal. No murmurs, rubs, or gallops.  ABDOMEN: Soft, nontender, nondistended. Bowel sounds present. No organomegaly or mass.  EXTREMITIES: No pedal edema, cyanosis, or clubbing.  NEUROLOGIC: Cranial nerves II through XII are intact. Muscle strength 4/5 in all extremities. Sensation intact. Gait not checked.  PSYCHIATRIC: The patient is alert and oriented x 3.  SKIN: No obvious rash, lesion, or ulcer.    Physical Exam LABORATORY PANEL:   CBC Recent Labs  Lab 03/13/17 0410  WBC 10.3  HGB 13.3  HCT 38.4  PLT 138*   ------------------------------------------------------------------------------------------------------------------  Chemistries  Recent Labs  Lab 03/11/17 0614  03/13/17 1030 03/14/17 0313  NA 136   < >  --  135  K 3.6   < >  --  3.4*  CL 101   < >  --  95*  CO2 26   < >  --  29  GLUCOSE 176*   < >  --  131*  BUN 10   < >  --  9  CREATININE 0.75   < >  --  0.75  CALCIUM 8.5*   < >  --  8.8*  MG 1.7  --  1.7  --   AST 30  --   --   --   ALT 16  --   --   --   ALKPHOS 44  --   --   --   BILITOT 0.6  --   --   --    < > =  values in this interval not displayed.   ------------------------------------------------------------------------------------------------------------------  Cardiac Enzymes No results for input(s): TROPONINI in the last 168 hours. ------------------------------------------------------------------------------------------------------------------  RADIOLOGY:  Dg Chest 2 View  Result Date: 03/14/2017 CLINICAL DATA:  Previous thoracotomy and lobectomy on the left for carcinoma. Chest tube in place EXAM: CHEST  2 VIEW COMPARISON:  March 13, 2017. FINDINGS: Chest tube unchanged in position. There is a minimal left apical pneumothorax. There is atelectatic change in consolidation  in the left base with small left pleural effusion. There is postoperative change on the right with volume loss the right upper lobe. No new opacity is evident compared to 1 day prior. Heart size is normal. Pulmonary vascular is normal. No adenopathy. There is aortic atherosclerosis. No evident bone lesions. IMPRESSION: Chest tube remains on the left with minimal left apical pneumothorax. Persistent atelectasis and consolidation in left base with small left pleural effusion. Postoperative change right upper lobe with volume loss. No new opacity evident. Stable cardiac silhouette. There is aortic atherosclerosis. Aortic Atherosclerosis (ICD10-I70.0). Electronically Signed   By: Lowella Grip III M.D.   On: 03/14/2017 09:15   Dg Chest Port 1 View  Result Date: 03/13/2017 CLINICAL DATA:  Acute onset of shortness of breath. EXAM: PORTABLE CHEST 1 VIEW COMPARISON:  Radiograph yesterday at 0653 hour FINDINGS: Left-sided chest tube remains in place tip at the apex. No visualized pneumothorax. Additional catheter about the lateral chest in the region of skin staples. Unchanged pleuroparenchymal opacity at the left lung base. Heart is normal in size. Mediastinal contours are unchanged. Chain sutures at the right hilum. Right lung is clear. IMPRESSION: No change from prior exam. Left chest tube remains in place with basilar pleuroparenchymal opacity. No pneumothorax. Electronically Signed   By: Jeb Levering M.D.   On: 03/13/2017 05:49    ASSESSMENT AND PLAN:   Active Problems:   Lung cancer, lower lobe (HCC)  * Adenocarcinoma of left lower lung- s/p lobectomy   S/p chest tube.   Manage per primary team.    * Chest pain    on fentanyl patch.   On percocet PRN for breakthrough pain.   Incentive spirometer.   Encourage for sitting up in bed and spirometer, to prevent infection.   Much improved now, asking me to cut down the pain medication as she is not hurting much.   I have discontinued fentanyl  patch today and encouraged her to assess for Percocet for any breakthrough pains.   If she is stable, she could be discharged on oral Percocet prescription as needed and should be provided with 20 tablets.  * Htn    Cont lisinopril, HCTZ, Atenolol   Increase lisinopril up to 40 mg  as her blood pressure was running higher.   But today again her blood pressure came down so I'm decreasing it back to the home dose.   She can be discharged on the same home dose of hypertensive medications  * Hyperlipidemia   Simvastatin.  * hypokalemia   Replace oral. Improved.   All the records are reviewed and case discussed with Care Management/Social Workerr. Management plans discussed with the patient, family and they are in agreement.  CODE STATUS: full.  TOTAL TIME TAKING CARE OF THIS PATIENT: 35 minutes.   husband in room. Discussed the plan with him and patient.  Likely discharge planning in 24 hours by primary team, as there is no active issues going on currently I will sign off. Suggest to discharge her on the same  oral medications and give her a prescription for Percocet  tablets as needed for her pain. Please call for any changes or new updates  POSSIBLE D/C IN 1 DAYS, DEPENDING ON CLINICAL CONDITION.   Vaughan Basta M.D on 03/14/2017   Between 7am to 6pm - Pager - [phone]  After 6pm go to [email or web address] - password [email or web address] [facility]  Office  [phone]  CC: Primary care physician; Rusty Aus, MD  Note: This dictation was prepared with Dragon dictation along with smaller phrase technology. Any transcriptional errors that result from this process are unintentional.

## 2017-03-14 NOTE — Clinical Social Work Note (Signed)
CSW received referral for SNF.  Case discussed with case manager and plan is to discharge home with home health.  CSW to sign off please re-consult if social work needs arise.  Wyolene Weimann R. Ricketta Colantonio, MSW, LCSWA [phone]  

## 2017-03-14 NOTE — Progress Notes (Addendum)
Notified MD of soft BP and BP med dose ordered here is more than at home per patient.

## 2017-03-14 NOTE — Progress Notes (Addendum)
Appears that Fentanly patch was removed by MD with dressing change. New patch will not be replaced as Dr. Molli Hazard wants to see how patients pain is without patch.

## 2017-03-14 NOTE — Care Management Important Message (Signed)
Important Message  Patient Details  Name: Heather [name] MRN: [id number] Date of Birth: March 09, 1935   Medicare Important Message Given:  Yes    Beverly Sessions, RN 03/14/2017, 3:34 PM

## 2017-03-14 NOTE — Progress Notes (Signed)
SATURATION QUALIFICATIONS: (This note is used to comply with regulatory documentation for home oxygen)  Patient Saturations on Room Air at Rest = 93%  Patient Saturations on Room Air while Ambulating = 89%  Please briefly explain why patient needs home oxygen: Patient's sats did not drop low enough to qualify for home O2.

## 2017-03-15 ENCOUNTER — Inpatient Hospital Stay: Payer: Medicare HMO

## 2017-03-15 ENCOUNTER — Other Ambulatory Visit: Payer: Self-pay

## 2017-03-15 DIAGNOSIS — J939 Pneumothorax, unspecified: Secondary | ICD-10-CM

## 2017-03-15 LAB — BASIC METABOLIC PANEL
Anion gap: 9 (ref 5–15)
BUN: 13 mg/dL (ref 6–20)
CHLORIDE: 98 mmol/L — AB (ref 101–111)
CO2: 28 mmol/L (ref 22–32)
CREATININE: 0.66 mg/dL (ref 0.44–1.00)
Calcium: 8.8 mg/dL — ABNORMAL LOW (ref 8.9–10.3)
GFR calc Af Amer: >60 mL/min (ref >60)
GFR calc non Af Amer: >60 mL/min (ref >60)
Glucose, Bld: 128 mg/dL — ABNORMAL HIGH (ref 65–99)
POTASSIUM: 3.6 mmol/L (ref 3.5–5.1)
Sodium: 135 mmol/L (ref 135–145)

## 2017-03-15 MED ORDER — OXYCODONE-ACETAMINOPHEN 5-325 MG PO TABS: 1.0000 | ORAL_TABLET | ORAL | 0 refills | Status: DC | PRN

## 2017-03-15 NOTE — Progress Notes (Signed)
SATURATION QUALIFICATIONS: (This note is used to comply with regulatory documentation for home oxygen)  Patient Saturations on Room Air at Rest = 97%  Patient Saturations on Room Air while Ambulating = 97% went down to 88% and back up to 96%   Please briefly explain why patient needs home oxygen: Patient sats on second lap went down to 88% and resolved back up to 96% on her own without O2.

## 2017-03-15 NOTE — Discharge Summary (Signed)
Physician Discharge Summary  Patient ID: Heather [name] MRN: [id number] DOB/AGE: January 06, 1936 82 y.o.  Admit date: 03/10/2017 Discharge date: 03/15/2017   Discharge Diagnoses:  Active Problems:   Lung cancer, lower lobe ([facility])   Procedures:  Left thoracotomy and wedge resection of LLL mass.  Frozen section consistent with adenocarcinoma with negative margins.  Hospital Course: Did well postop and had her chest tube removed on postoperative day 5.  No postoperative complications.  Discharged to home on room air, followup with me later this week.  Disposition: 01-Home or Self Care  Discharge Instructions    Diet - low sodium heart healthy   Complete by:  As directed    Discharge wound care:   Complete by:  As directed    Remove dressing tonight and wash with soap and water.  Please call the office and make appointment for this Friday.   Increase activity slowly   Complete by:  As directed      Allergies as of 03/15/2017      Reactions   Contrast Media [iodinated Diagnostic Agents]    Cortisone    Patient had facial swelling and itching from cortisone injection IM      Medication List    TAKE these medications   ALPRAZolam 0.25 MG tablet Commonly known as:  XANAX Take 0.25 mg by mouth 2 (two) times daily as needed for anxiety.   atenolol 50 MG tablet Commonly known as:  TENORMIN Take 50 mg by mouth at bedtime.   cetirizine 10 MG tablet Commonly known as:  ZYRTEC Take 10 mg by mouth daily.   Cholecalciferol 1000 units tablet Take 1,000 Units by mouth daily.   Co Q 10 100 MG Caps Take 1 capsule by mouth daily.   donepezil 5 MG tablet Commonly known as:  ARICEPT Take 5 mg by mouth at bedtime.   fluticasone 50 MCG/ACT nasal spray Commonly known as:  FLONASE Place 1 spray into the nose daily as needed for allergies.   hydrochlorothiazide 25 MG tablet Commonly known as:  HYDRODIURIL Take 25 mg by mouth daily as needed.   lisinopril-hydrochlorothiazide 20-12.5 MG  tablet Commonly known as:  PRINZIDE,ZESTORETIC Take 1 tablet by mouth daily.   oxyCODONE-acetaminophen 5-325 MG tablet Commonly known as:  PERCOCET/ROXICET Take 1-2 tablets by mouth every 4 (four) hours as needed for moderate pain.   simvastatin 20 MG tablet Commonly known as:  ZOCOR Take 20 mg by mouth daily.            Discharge Care Instructions  (From admission, onward)        Start     Ordered   03/15/17 0000  Discharge wound care:    Comments:  Remove dressing tonight and wash with soap and water.  Please call the office and make appointment for this Friday.   03/15/17 [place], MD

## 2017-03-15 NOTE — Care Management (Signed)
Plan for discharge today.  Patient has been ambulated in the halls and was able to maintain her sats on RA.  Home health orders have been entered for PT and RN.  [name] with [facility] notified.  RNCM signing off.

## 2017-03-15 NOTE — Progress Notes (Signed)
Discharge teaching given to patient, patient verbalized understanding and had no questions. Patient IV removed. Patient will be transported home by family. All patient belongings gathered prior to leaving.  

## 2017-03-15 NOTE — Progress Notes (Signed)
Did well overnight.  Oxygen sats in the low to mid 90s. Ambulated in halls several times.  Eating much better.  Afebrile.  Wounds clean and dry. Lungs with coarse rhonchi bilaterally Heart regular  CXRay from today with minimal pneumothorax  I believe she is ready for discharge.  I will see her at the end of the week in the office with a chest xray.  Pathology is still pending.  [employer].

## 2017-03-16 LAB — SURGICAL PATHOLOGY

## 2017-03-18 ENCOUNTER — Ambulatory Visit (INDEPENDENT_AMBULATORY_CARE_PROVIDER_SITE_OTHER): Payer: Medicare HMO | Admitting: Cardiothoracic Surgery

## 2017-03-18 ENCOUNTER — Encounter: Payer: Self-pay | Admitting: Cardiothoracic Surgery

## 2017-03-18 ENCOUNTER — Ambulatory Visit
Admission: RE | Admit: 2017-03-18 | Discharge: 2017-03-18 | Disposition: A | Discharge status: Home / Self Care | Payer: Medicare HMO | Source: Ambulatory Visit | Attending: Cardiothoracic Surgery | Admitting: Cardiothoracic Surgery

## 2017-03-18 ENCOUNTER — Telehealth: Payer: Self-pay | Admitting: General Practice

## 2017-03-18 VITALS — BP 116/75 | HR 74 | Temp 98.1°F | Resp 18 | Ht 62.0 in | Wt 142.8 lb

## 2017-03-18 DIAGNOSIS — J90 Pleural effusion, not elsewhere classified: Secondary | ICD-10-CM | POA: Diagnosis not present

## 2017-03-18 DIAGNOSIS — J939 Pneumothorax, unspecified: Secondary | ICD-10-CM | POA: Diagnosis present

## 2017-03-18 DIAGNOSIS — Z9889 Other specified postprocedural states: Secondary | ICD-10-CM

## 2017-03-18 MED ORDER — FENTANYL 12 MCG/HR TD PT72: 12.5000 ug | MEDICATED_PATCH | TRANSDERMAL | 0 refills | Status: DC

## 2017-03-18 NOTE — Telephone Encounter (Signed)
Called Amy back to let her know that it was okay for her to go and see the patient twice a week for two weeks and once a week for four weeks. Amy stated that it was sufficient with my verbal order. I asked Stanton Kidney if she needed anything else and she stated that she did not.

## 2017-03-18 NOTE — Progress Notes (Signed)
She returns today in follow-up.  She still has some soreness in her thoracotomy wound.  She states she does not have real pain.  She has been taking 1 or 2 Tylenol tablets per day only.  I explained to her that it was quite important for her pain to be under good control so that she could cough.  She does think that she is somewhat congested.  Her chest x-ray today looks reasonable with postoperative changes at the left base.  There is a minuscule pneumothorax present.  Her lungs sound pretty good today.  There is actually equal breath sounds bilaterally.  Her thoracotomy wound is well-healed.  Her chest tube site is well-healed.  We removed her sutures and staples and Steri-Stripped her wound.  I encouraged her to take some narcotics.  She does not want to take any oral opioids.  She did have some relief with the fentanyl patch while she was in the hospital we will go ahead and provide that to her.  I will see her back again in 2 weeks time with another chest x-ray.  She will follow-up with Dr. Mike Gip in 2 weeks as well.

## 2017-03-18 NOTE — Telephone Encounter (Signed)
Heather [name] with [facility] is calling regarding the patient said she needs an order or a verbal over the phone she is needing it for two times a week for two weeks and one time a week for four weeks. Please call Heather at [phone].

## 2017-03-18 NOTE — Patient Instructions (Addendum)
  Please see your follow up appointment listed below. We need for you to have the chest xray prior to seeing Dr.Oaks.  We have removed your staples today and placed steri strips. You may shower as usual but be sure to pat dry the strips.

## 2017-03-21 ENCOUNTER — Encounter: Payer: Medicare HMO | Admitting: Cardiothoracic Surgery

## 2017-03-31 ENCOUNTER — Inpatient Hospital Stay: Payer: Medicare HMO | Attending: Hematology and Oncology | Admitting: Hematology and Oncology

## 2017-03-31 ENCOUNTER — Encounter: Payer: Self-pay | Admitting: Hematology and Oncology

## 2017-03-31 VITALS — BP 132/81 | HR 69 | Temp 97.0°F | Resp 18 | Wt 144.4 lb

## 2017-03-31 DIAGNOSIS — Z85118 Personal history of other malignant neoplasm of bronchus and lung: Secondary | ICD-10-CM | POA: Insufficient documentation

## 2017-03-31 DIAGNOSIS — K449 Diaphragmatic hernia without obstruction or gangrene: Secondary | ICD-10-CM | POA: Diagnosis not present

## 2017-03-31 DIAGNOSIS — I479 Paroxysmal tachycardia, unspecified: Secondary | ICD-10-CM | POA: Diagnosis not present

## 2017-03-31 DIAGNOSIS — N390 Urinary tract infection, site not specified: Secondary | ICD-10-CM | POA: Insufficient documentation

## 2017-03-31 DIAGNOSIS — E7800 Pure hypercholesterolemia, unspecified: Secondary | ICD-10-CM | POA: Diagnosis not present

## 2017-03-31 DIAGNOSIS — Z79899 Other long term (current) drug therapy: Secondary | ICD-10-CM

## 2017-03-31 DIAGNOSIS — E785 Hyperlipidemia, unspecified: Secondary | ICD-10-CM | POA: Diagnosis not present

## 2017-03-31 DIAGNOSIS — C3411 Malignant neoplasm of upper lobe, right bronchus or lung: Secondary | ICD-10-CM

## 2017-03-31 DIAGNOSIS — I10 Essential (primary) hypertension: Secondary | ICD-10-CM

## 2017-03-31 DIAGNOSIS — Z87891 Personal history of nicotine dependence: Secondary | ICD-10-CM | POA: Insufficient documentation

## 2017-03-31 DIAGNOSIS — C3432 Malignant neoplasm of lower lobe, left bronchus or lung: Secondary | ICD-10-CM | POA: Diagnosis not present

## 2017-03-31 DIAGNOSIS — Z8673 Personal history of transient ischemic attack (TIA), and cerebral infarction without residual deficits: Secondary | ICD-10-CM | POA: Insufficient documentation

## 2017-03-31 DIAGNOSIS — K219 Gastro-esophageal reflux disease without esophagitis: Secondary | ICD-10-CM | POA: Insufficient documentation

## 2017-03-31 DIAGNOSIS — I471 Supraventricular tachycardia: Secondary | ICD-10-CM | POA: Insufficient documentation

## 2017-03-31 DIAGNOSIS — Z86010 Personal history of colonic polyps: Secondary | ICD-10-CM | POA: Diagnosis not present

## 2017-03-31 DIAGNOSIS — I6529 Occlusion and stenosis of unspecified carotid artery: Secondary | ICD-10-CM | POA: Diagnosis not present

## 2017-03-31 NOTE — Progress Notes (Signed)
Pt in today for follow up. Reports continued fatigue, but states it is improving on a daily basis since surgery.

## 2017-03-31 NOTE — Progress Notes (Signed)
[facility] day:  03/31/2017    Chief Complaint: Heather [name] is a 82 y.o. 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  HPI: The patient was last seen in the medical oncology clinic on 02/22/2017.  At that time, she denied any complaints. Exam was stable.  PET scan revealed a 1.1 cm LLL nodule worrisome for an indolent primary bronchogenic neoplasm.  Metastasis was considered unlikely.  She was referred to Dr. Genevive Bi.  She underwent left thoracotomy with wedge resection of the LLL mass on 03/10/2017.  Pathology revealed an 8 mm invasive adenocarcinoma with solid and acinar patterns.  There was no visceral invasion.  There was no lymphovascular invasion.  All margins were negative.  Pathologic stage was T1aNx.  Symptomatically, she is "hangining in there'.  She did well with surgery.  She denies any shortness of breath or chest pain.   Past Medical History:  Diagnosis Date  . Brain aneurysm   . Cancer ([place]) lung   2016  . Carotid artery stenosis   . Carotid stenosis 06/19/2013   Overview:  Normal study, 1/16  . Colonic polyp   . GERD (gastroesophageal reflux disease)   . Hiatal hernia   . Hypercholesteremia   . Hyperlipidemia, mixed 06/09/2015  . Hypertension   . Lung nodule 06/21/2014  . Macrocytic 10/06/2014  . Medicare annual wellness visit, initial 07/08/2016   Overview:  6/18  . PAT (paroxysmal atrial tachycardia) ([place]) 06/19/2013  . Plantar fasciitis   . Tachycardia, paroxysmal ([place]) 03/31/14    Past Surgical History:  Procedure Laterality Date  . BREAST EXCISIONAL BIOPSY Right 40 yrs ago   neg  . BREAST LUMPECTOMY     benign  . CATARACT EXTRACTION W/ INTRAOCULAR LENS  IMPLANT, BILATERAL Bilateral   . CEREBRAL ANEURYSM REPAIR  2004  . CERVIX LESION DESTRUCTION    . COLONOSCOPY  WITH PROPOFOL N/A 03/31/2015   Procedure: COLONOSCOPY WITH PROPOFOL;  Surgeon: Manya Silvas, MD;  Location: [facility] ENDOSCOPY;  Service: Endoscopy;  Laterality: N/A;  . FLEXIBLE BRONCHOSCOPY N/A 03/10/2017   Procedure: FLEXIBLE BRONCHOSCOPY;  Surgeon: Nestor Lewandowsky, MD;  Location: [facility] ORS;  Service: Thoracic;  Laterality: N/A;  . THORACOTOMY/LOBECTOMY Left 03/10/2017   Procedure: THORACOTOMY WITH LUNG WEDGE RESECTION POSSIBLE LOBECTOMY;  Surgeon: Nestor Lewandowsky, MD;  Location: [facility] ORS;  Service: Thoracic;  Laterality: Left;  . TUBAL LIGATION      Family History  Problem Relation Age of Onset  . Alcohol abuse Mother   . Heart attack Father   . Breast cancer Neg Hx     Social History:  reports that she quit smoking about 28 years ago. She quit after 35.00 years of use. she has never used smokeless tobacco. She reports that she drinks alcohol. She reports that she does not use drugs.  She lives in [place].  She is accompanied by her husband today.  Allergies:  Allergies  Allergen Reactions  . Contrast Media [Iodinated Diagnostic Agents]   . Cortisone     Patient had facial swelling and itching from cortisone injection IM    Current Medications: Current Outpatient Medications  Medication Sig Dispense Refill  . atenolol (TENORMIN) 50 MG tablet Take 50 mg by mouth at bedtime.    . cetirizine (ZYRTEC) 10 MG tablet Take 10 mg by mouth daily.    Marland Kitchen  Cholecalciferol 1000 UNITS tablet Take 1,000 Units by mouth daily.    . Coenzyme Q10 (CO Q 10) 100 MG CAPS Take 1 capsule by mouth daily.    Marland Kitchen donepezil (ARICEPT) 5 MG tablet Take 5 mg by mouth at bedtime.     . fluticasone (FLONASE) 50 MCG/ACT nasal spray Place 1 spray into the nose daily as needed for allergies.     Marland Kitchen lisinopril-hydrochlorothiazide (PRINZIDE,ZESTORETIC) 20-12.5 MG per tablet Take 1 tablet by mouth daily.    . simvastatin (ZOCOR) 20 MG tablet Take 20 mg by mouth daily.    Marland Kitchen ALPRAZolam (XANAX) 0.25 MG tablet Take 0.25 mg by  mouth 2 (two) times daily as needed for anxiety.     . hydrochlorothiazide (HYDRODIURIL) 25 MG tablet Take 25 mg by mouth daily as needed.     Marland Kitchen oxyCODONE-acetaminophen (PERCOCET/ROXICET) 5-325 MG tablet Take 1-2 tablets by mouth every 4 (four) hours as needed for moderate pain. (Patient not taking: Reported on 03/31/2017) 30 tablet 0   No current facility-administered medications for this visit.    Facility-Administered Medications Ordered in Other Visits  Medication Dose Route Frequency Provider Last Rate Last Dose  . 0.9 %  sodium chloride infusion   Intravenous Continuous Manya Silvas, MD        Review of Systems:  GENERAL:  Feels "good".  No fevers or sweats.  Weight up 1 pound since last visit. PERFORMANCE STATUS (ECOG):  0 HEENT:  No visual changes, runny nose, sore throat, mouth sores or tenderness. Lungs:  No shortness of breath or cough.  No hemoptysis. Cardiac:  No chest pain, palpitations, orthopnea, or PND. GI:  No nausea, vomiting, diarrhea, constipation, melena or hematochezia. Interval colonoscopy. GU:  No urgency, frequency, dysuria, or hematuria.  Interval UTI. Musculoskeletal:  Low back pain due to ruptured disk.  No joint pain.  No muscle tenderness. Extremities:  No pain or swelling. Skin:  No rashes or skin changes. Neuro: No headache, numbness or weakness, balance or coordination issues. Endocrine:  No diabetes, thyroid issues, hot flashes or night sweats. Psych:  No mood changes, depression or anxiety. Pain:  No focal pain. Review of systems:  All other systems reviewed and found to be negative.  Physical Exam: Blood pressure 132/81, pulse 69, temperature (!) 97 F (36.1 C), temperature source Tympanic, resp. rate 18, weight 144 lb 6 oz (65.5 kg), SpO2 98 %. GENERAL:  Well developed, well nourished, woman sitting comfortably in the exam room in no acute distress. MENTAL STATUS:  Alert and oriented to person, place and time. HEAD:  Shoulder length auburn  styled hair.  Normocephalic, atraumatic, face symmetric, no Cushingoid features. EYES:  Red glasses.  Brown eyes.  Arcus.  Pupils equal round and reactive to light and accomodation.  No conjunctivitis or scleral icterus. ENT:  Oropharynx clear without lesion.  Tongue normal. Mucous membranes moist.  RESPIRATORY:  Clear to auscultation without rales, wheezes or rhonchi. CARDIOVASCULAR:  Regular rate and rhythm without murmur, rub or gallop. CHEST WALL:  Left sided incision with steri-strips in place.  No erythema, increased warmth or tenderness. ABDOMEN:  Soft, non-tender, with active bowel sounds, and no hepatosplenomegaly.  No masses. SKIN:  No rashes, ulcers or lesions. EXTREMITIES: No edema, no skin discoloration or tenderness.  No palpable cords. LYMPH NODES: No palpable cervical, supraclavicular, axillary or inguinal adenopathy  NEUROLOGICAL: Unremarkable. PSYCH:  Appropriate.   Imaging studies: 02/13/2014:  PET scan revealed a 1 cm right upper lobe hypermetabolic nodule (SUV 3.8)  and a 6 mm right upper lobe nodule (no metabolic activity). There was no mediastianl adenopathy.  09/27/2014:  Chest CT revealed images consistent with right upper lobe wedge resection.  There was slight pleural parenchymal nodular thickening and mild distortion consistent with post operative scarring.   04/28/2015:  Chest CT revealed stable postoperative findings and scarring in the right lung.  There was slight nodularity along the wedge resection site.  There was a 7 x 6 mm right upper lobe nodule (unchanged from 02/05/2014).  10/28/2015:  Chest CT revealed no evidence of lung cancer recurrence following right upper lobe wedge resection.  There was stable 7 mm perihilar pulmonary nodule in the right upper lobe.   04/30/2016:  Chest CT revealed stable surgical changes involving the right upper lung from a prior wedge resection.  There was no findings for recurrent tumor, mediastinal/hilar adenopathy or pulmonary  metastatic disease.  There was a stable 7 mm superior segment right lower lobe pulmonary nodule.  The left lower lobe 6 mm pulmonary nodule appeared slightly more solid and nodular when compared to prior examination. 08/02/2016:  Chest CT revealed stable surgical changes right lung and a stable 7 mm right retro hilar nodule.  The 7 mm posterior left lower lobe nodule was not substantially changed from 6 mm on the prior study. When comparing back to older exam of 10/28/2015, this was more clearly progressed over that timeframe. 02/10/2017:  Chest CT revealed clear interval progression of the posterior left lower lobe pulmonary nodule, now measuring 12 mm in long axis and having a distinctly lobular contour. Imaging features were very concerning for neoplasm. Lesion position in the posterior lung base makes it susceptible to motion artifact on PET imaging, but at it's current size, PET imaging may well prove helpful to further evaluate.  There was no change in the 7 mm nodule posterior to the right hilum. 02/18/2017:  PET scan revealed a 11 x 8 mm posterior left lower lobe pulmonary nodule that had mildly progressed from prior studies and demonstrated mild hypermetabolism (SUV 1.6).  The appearance was considered worrisome for an indolent primary bronchogenic neoplasm.  Metastasis was considered unlikely.   No visits with results within 3 Day(s) from this visit.  Latest known visit with results is:  Admission on 03/10/2017, Discharged on 03/15/2017  Component Date Value Ref Range Status  . Order Confirmation 03/09/2017    Final                   Value:ORDER PROCESSED BY BLOOD BANK Performed at [facility], [address] [place], [place] [address]   . ABO/RH(D) 03/10/2017    Final                   Value:A POS Performed at [facility], [address], [place], [place] [address]   . SURGICAL PATHOLOGY 03/10/2017    Final                   Value:Surgical Pathology CASE:  [id number] PATIENT: [facility] Surgical Pathology Report     SPECIMEN SUBMITTED: A. Lung, left lower lobe  CLINICAL HISTORY: None provided  PRE-OPERATIVE DIAGNOSIS: Lung mass  POST-OPERATIVE DIAGNOSIS: Same as pre-op     DIAGNOSIS: A. LUNG, LEFT LOWER LOBE; WEDGE RESECTION: - INVASIVE ADENOCARCINOMA, SOLID AND ACINAR TYPES. - THE SURGICAL MARGINS OF RESECTION ARE NEGATIVE. - SEE SUMMARY BELOW.   Surgical Pathology Cancer Case Summary  LUNG: Procedure:  Wedge resection Specimen Laterality:  Left Tumor Site:  Lower lobe Tumor Size: 0.8 cm Tumor Focality: Single tumor Histologic Type: Invasive carcinoma with solid and acinar patterns Visceral Pleura Invasion: Not identified, elastin stain confirms Lymphovascular Invasion: Not identified Direct Invasion of Adjacent Structures: No adjacent structures present Margins: All margins are uninvolved by tumor, Distance to parenchymal margin 0.8 cm Treatment Effect: No known pr                         esurgical therapy Regional Lymph nodes:  No lymph nodes submitted or found Pathologic Stage Classification (pTNM, AJCC 8th Edition): ZC5Y pNx pM not applicable TNM Descriptors: Not applicable  The control slide stained appropriately.   GROSS DESCRIPTION:  A. Intraoperative Consultation:     Received: fresh     Specimen: left lower lobe     Pathologic Evaluation: frozen section and margin     Diagnosis: FS and IOC - mass, LLL lung; wedge resection: Non-small cell lung cancer, favor adenocarcinoma. Margins appear grossly negative.     Communicated to: Dr. Genevive Bi at 10:35 AM on 03/10/2017, Delorse Lek M.D.     Tissue submitted: A1  A. Labeled: left lower lobe  Type of procedure: wedge  Laterality: left  Weight of specimen: 7 grams  Size of specimen: 6.4 x 5.5 x 1.0 cm  Other attached structures: U-shaped 11 cm long staple line around 3 sides  Specimen integrity: received intact  Orientation:  pleura-blue Staple line removed and parenchyma-green Attached st                         itch marks closest margin (per requisition)  Number of masses: 1  Size(s) of mass(es): 1.0 x 1.0 x 0.8 cm, representative frozen  Location of mass(es): central  Description of mass(es): ill-defined firm tan  Relationship of mass(es) to bronchus: cannot be determined  Margins: Bronchial margin: cannot be determined Parenchymal margin: 0.8 cm Other margins: no additional noted  Relationship / distance of mass(es) to pleura: abutting but appears intact overlying  Extension of mass(es): none identified  Description of remainder of lung: crepitant red tan  Block summary: 1-frozen section remnant 2-4-remaining tumor with perpendicular margins Final Diagnosis performed by Delorse Lek, MD.  Electronically signed 03/16/2017 6:24:23PM    The electronic signature indicates that the named Attending Pathologist has evaluated the specimen  Technical component performed at [facility], [address], [place], [place] [address] Lab: [phone] Dir: Rush Farmer, MD, MMM                           Professional component performed at [facility], [facility], [address], [place], [place] [address] Lab: [phone] Dir: Dellia Nims. Rubinas, MD    . Glucose-Capillary 03/10/2017 136* 65 - 99 mg/dL Final  . WBC 03/11/2017 8.5  3.6 - 11.0 K/uL Final  . RBC 03/11/2017 3.69* 3.80 - 5.20 MIL/uL Final  . Hemoglobin 03/11/2017 12.6  12.0 - 16.0 g/dL Final  . HCT 03/11/2017 37.6  35.0 - 47.0 % Final  . MCV 03/11/2017 102.0* 80.0 - 100.0 fL Final  . MCH 03/11/2017 34.2* 26.0 - 34.0 pg Final  . MCHC 03/11/2017 33.5  32.0 - 36.0 g/dL Final  . RDW 03/11/2017 12.9  11.5 - 14.5 % Final  . Platelets 03/11/2017 127* 150 - 440 K/uL Final   Performed at [facility], [address] [place], [place] [address]  .  Sodium 03/11/2017 136  135 - 145 mmol/L Final  . Potassium 03/11/2017 3.6   3.5 - 5.1 mmol/L Final  . Chloride 03/11/2017 101  101 - 111 mmol/L Final  . CO2 03/11/2017 26  22 - 32 mmol/L Final  . Glucose, Bld 03/11/2017 176* 65 - 99 mg/dL Final  . BUN 03/11/2017 10  6 - 20 mg/dL Final  . Creatinine, Ser 03/11/2017 0.75  0.44 - 1.00 mg/dL Final  . Calcium 03/11/2017 8.5* 8.9 - 10.3 mg/dL Final  . Total Protein 03/11/2017 5.8* 6.5 - 8.1 g/dL Final  . Albumin 03/11/2017 3.3* 3.5 - 5.0 g/dL Final  . AST 03/11/2017 30  15 - 41 U/L Final  . ALT 03/11/2017 16  14 - 54 U/L Final  . Alkaline Phosphatase 03/11/2017 44  38 - 126 U/L Final  . Total Bilirubin 03/11/2017 0.6  0.3 - 1.2 mg/dL Final  . GFR calc non Af Amer 03/11/2017 >60  >60 mL/min Final  . GFR calc Af Amer 03/11/2017 >60  >60 mL/min Final   Comment: (NOTE) The eGFR has been calculated using the CKD EPI equation. This calculation has not been validated in all clinical situations. eGFR's persistently <60 mL/min signify possible Chronic Kidney Disease.   Georgiann Hahn gap 03/11/2017 9  5 - 15 Final   Performed at [facility], [address] [place], [place] [address]  . Magnesium 03/11/2017 1.7  1.7 - 2.4 mg/dL Final   Performed at [facility], [address] [place], [place] [address]  . Phosphorus 03/11/2017 3.3  2.5 - 4.6 mg/dL Final   Performed at [facility], [address] [place], [place] [address]  . WBC 03/13/2017 10.3  3.6 - 11.0 K/uL Final  . RBC 03/13/2017 3.86  3.80 - 5.20 MIL/uL Final  . Hemoglobin 03/13/2017 13.3  12.0 - 16.0 g/dL Final  . HCT 03/13/2017 38.4  35.0 - 47.0 % Final  . MCV 03/13/2017 99.5  80.0 - 100.0 fL Final  . MCH 03/13/2017 34.6* 26.0 - 34.0 pg Final  . MCHC 03/13/2017 34.8  32.0 - 36.0 g/dL Final  . RDW 03/13/2017 12.7  11.5 - 14.5 % Final  . Platelets 03/13/2017 138* 150 - 440 K/uL Final   Performed at [facility], [address] [place], [place] [address]  . Sodium 03/13/2017 131* 135 - 145 mmol/L Final  . Potassium  03/13/2017 3.1* 3.5 - 5.1 mmol/L Final  . Chloride 03/13/2017 93* 101 - 111 mmol/L Final  . CO2 03/13/2017 30  22 - 32 mmol/L Final  . Glucose, Bld 03/13/2017 142* 65 - 99 mg/dL Final  . BUN 03/13/2017 9  6 - 20 mg/dL Final  . Creatinine, Ser 03/13/2017 0.56  0.44 - 1.00 mg/dL Final  . Calcium 03/13/2017 8.6* 8.9 - 10.3 mg/dL Final  . GFR calc non Af Amer 03/13/2017 >60  >60 mL/min Final  . GFR calc Af Amer 03/13/2017 >60  >60 mL/min Final   Comment: (NOTE) The eGFR has been calculated using the CKD EPI equation. This calculation has not been validated in all clinical situations. eGFR's persistently <60 mL/min signify possible Chronic Kidney Disease.   Georgiann Hahn gap 03/13/2017 8  5 - 15 Final   Performed at [facility], [address] [place], [place] [address]  . Magnesium 03/13/2017 1.7  1.7 - 2.4 mg/dL Final   Performed at [facility], [address] [place], [place] [address]  . Sodium 03/14/2017 135  135 - 145 mmol/L Final  . Potassium 03/14/2017 3.4* 3.5 - 5.1 mmol/L Final  . Chloride 03/14/2017 95* 101 - 111 mmol/L Final  . CO2 03/14/2017 29  22 - 32 mmol/L Final  . Glucose, Bld 03/14/2017 131* 65 - 99 mg/dL Final  . BUN 03/14/2017 9  6 - 20 mg/dL Final  . Creatinine, Ser 03/14/2017 0.75  0.44 - 1.00 mg/dL Final  . Calcium 03/14/2017 8.8* 8.9 - 10.3 mg/dL Final  . GFR calc non Af Amer 03/14/2017 >60  >60 mL/min Final  . GFR calc Af Amer 03/14/2017 >60  >60 mL/min Final   Comment: (NOTE) The eGFR has been calculated using the CKD EPI equation. This calculation has not been validated in all clinical situations. eGFR's persistently <60 mL/min signify possible Chronic Kidney Disease.   Georgiann Hahn gap 03/14/2017 11  5 - 15 Final   Performed at [facility], [address] [place], [place] [address]  . Sodium 03/15/2017 135  135 - 145 mmol/L Final  . Potassium 03/15/2017 3.6  3.5 - 5.1 mmol/L Final  . Chloride 03/15/2017 98* 101 - 111 mmol/L Final   . CO2 03/15/2017 28  22 - 32 mmol/L Final  . Glucose, Bld 03/15/2017 128* 65 - 99 mg/dL Final  . BUN 03/15/2017 13  6 - 20 mg/dL Final  . Creatinine, Ser 03/15/2017 0.66  0.44 - 1.00 mg/dL Final  . Calcium 03/15/2017 8.8* 8.9 - 10.3 mg/dL Final  . GFR calc non Af Amer 03/15/2017 >60  >60 mL/min Final  . GFR calc Af Amer 03/15/2017 >60  >60 mL/min Final   Comment: (NOTE) The eGFR has been calculated using the CKD EPI equation. This calculation has not been validated in all clinical situations. eGFR's persistently <60 mL/min signify possible Chronic Kidney Disease.   Georgiann Hahn gap 03/15/2017 9  5 - 15 Final   Performed at [facility], [address] [place], [place] [address]    Assessment:  Heather [name] is a 82 y.o. 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 Pathology revealed a 1.1 cm high-grade adenocarcinoma with pleural invasion. Nodes were negative. Pathologic stage was T2aN1M0 (stage IB).  She is s/p wedge resection of a left lower lobe mass on 03/10/2017.  Pathology revealed an 8 mm invasive adenocarcinoma with solid and acinar patterns.  There was no visceral invasion.  There was no lymphovascular invasion.  All margins were negative.  Pathologic stage was T1aNx (stage IA).  PET scan on 02/13/2014 revealed a 1 cm right upper lobe hypermetabolic nodule (SUV 3.8) and a 6 mm right upper lobe nodule (no metabolic activity). There was no mediastianl adenopathy.   Chest CT on 02/10/2017 revealed clear interval progression of the posterior left lower lobe pulmonary nodule, now measuring 12 mm in long axis and having a distinctly lobular contour. Imaging features were very concerning for neoplasm. Lesion position in the posterior lung base makes it susceptible to motion artifact on PET imaging, but at it's current size, PET imaging may well prove helpful to further evaluate.  There was no change in the 7 mm nodule posterior  to the right hilum.  PET scan on 02/18/2017 revealed a 11 x 8 mm posterior left lower lobe pulmonary nodule that had mildly progressed from prior studies and demonstrated mild hypermetabolism (SUV 1.6).  The appearance was considered worrisome for an indolent primary bronchogenic neoplasm.  CEA has been followed: 4.7 on 10/01/2014,  4.5 on 07/31/2015, 3.9 on 05/03/2016, and 3.9 on 02/11/2017.  Bilateral mammogram on 07/22/2016 revealed no evidence of malignancy.  She had a UTI which caused confusion.  She had shingles.  Symptomatically, she denies any complaints. She is healing well post surgery.   Plan: 1.  Review interval wedge resection.  LLL mass was adenocarcinoma.  Margins negative.  No role for chemotherapy. 2.  Discuss ongoing surveillance.  Follow-up CT in 6 months (09/07/2017). 3.  RTC after CT scan for MD assessment, labs (CBC with diff, CMP), and review of scan.   Nolon Stalls, MD 03/31/2017,4:35 PM

## 2017-04-01 ENCOUNTER — Encounter: Payer: Self-pay | Admitting: Cardiothoracic Surgery

## 2017-04-01 ENCOUNTER — Ambulatory Visit (INDEPENDENT_AMBULATORY_CARE_PROVIDER_SITE_OTHER): Payer: Medicare HMO | Admitting: Cardiothoracic Surgery

## 2017-04-01 ENCOUNTER — Ambulatory Visit
Admission: RE | Admit: 2017-04-01 | Discharge: 2017-04-01 | Disposition: A | Discharge status: Home / Self Care | Payer: Medicare HMO | Source: Ambulatory Visit | Attending: Cardiothoracic Surgery | Admitting: Cardiothoracic Surgery

## 2017-04-01 ENCOUNTER — Telehealth: Payer: Self-pay

## 2017-04-01 VITALS — BP 139/85 | HR 65 | Temp 97.8°F | Resp 18 | Ht 62.0 in | Wt 143.0 lb

## 2017-04-01 DIAGNOSIS — Z9889 Other specified postprocedural states: Secondary | ICD-10-CM | POA: Insufficient documentation

## 2017-04-01 DIAGNOSIS — I700 Atherosclerosis of aorta: Secondary | ICD-10-CM | POA: Diagnosis not present

## 2017-04-01 DIAGNOSIS — C3432 Malignant neoplasm of lower lobe, left bronchus or lung: Secondary | ICD-10-CM

## 2017-04-01 NOTE — Telephone Encounter (Signed)
Appointment reminder mailed for  Dr.Oaks 06/10/17 @ 8 am- Chest xray prior.  Patient notified.

## 2017-04-01 NOTE — Progress Notes (Signed)
  Patient ID: Heather [name], female   DOB: 09-24-1935, 82 y.o.   MRN: [id number]  HISTORY: She returns today in follow-up.  She has no complaints of pain or shortness of breath.  She did see Dr. Mike Gip yesterday and she was told to follow-up in 6 months.  She has some numbness around her right breast but that is improving as well.   Vitals:   04/01/17 0831  BP: 139/85  Pulse: 65  Resp: 18  Temp: 97.8 F (36.6 C)  SpO2: 97%     EXAM:    Resp: Lungs are clear bilaterally.  No respiratory distress, normal effort. Heart:  Regular without murmurs Her wound is well approximated without seroma or erythema  Neurological: Alert and oriented to person, place, and time. Coordination normal.  Skin: Skin is warm and dry. No rash noted. No diaphoretic. No erythema. No pallor.  Psychiatric: Normal mood and affect. Normal behavior. Judgment and thought content normal.    ASSESSMENT: I have independently reviewed her chest x-ray.  There is some blunting at the left costophrenic angle.  Otherwise it looks pretty good.   PLAN:   I will see her back again in 2 months with a repeat chest x-ray.    Nestor Lewandowsky, MD

## 2017-04-01 NOTE — Patient Instructions (Signed)
Please see your follow up appointment listed below.  Please be sure to have your chest x-ray prior to seeing Dr.Oaks.

## 2017-05-30 ENCOUNTER — Ambulatory Visit
Admission: RE | Admit: 2017-05-30 | Discharge: 2017-05-30 | Disposition: A | Discharge status: Home / Self Care | Payer: Medicare HMO | Source: Ambulatory Visit | Attending: Cardiothoracic Surgery | Admitting: Cardiothoracic Surgery

## 2017-05-30 DIAGNOSIS — Z9889 Other specified postprocedural states: Secondary | ICD-10-CM | POA: Insufficient documentation

## 2017-05-30 DIAGNOSIS — J984 Other disorders of lung: Secondary | ICD-10-CM | POA: Diagnosis not present

## 2017-06-10 ENCOUNTER — Encounter: Payer: Self-pay | Admitting: Cardiothoracic Surgery

## 2017-06-10 ENCOUNTER — Ambulatory Visit (INDEPENDENT_AMBULATORY_CARE_PROVIDER_SITE_OTHER): Payer: Medicare HMO | Admitting: Cardiothoracic Surgery

## 2017-06-10 VITALS — BP 196/100 | HR 64 | Temp 98.2°F | Resp 18 | Ht 62.0 in | Wt 146.2 lb

## 2017-06-10 DIAGNOSIS — C3432 Malignant neoplasm of lower lobe, left bronchus or lung: Secondary | ICD-10-CM

## 2017-06-10 NOTE — Progress Notes (Signed)
  Patient ID: Heather [name], female   DOB: [birth year], 82 y.o.   MRN: [id number]  HISTORY: Heather [name] returns today in follow-up.  She is now about 3 months out from her left thoracotomy and wedge resection of the left lower lobe adenocarcinoma.  She is also status post right thoracotomy and wedge resection of an adenocarcinoma in the past.  She follows up with Dr. Mike Gip and is scheduled to have another CT scan in August.  She states that she has been getting along pretty well.  She is not short of breath.  She has occasional pain in her twinges particularly on the left side whenever she exerts herself.  She did have a chest x-ray made which did not reveal any acute surgical problems.  There is some scarring in the right upper lung zone related to her prior thoracotomy.   Vitals:   06/10/17 0747  BP: (!) 196/100  Pulse: 64  Resp: 18  Temp: 98.2 F (36.8 C)  SpO2: 95%     EXAM:    Resp: Lungs are clear bilaterally.  No respiratory distress, normal effort. Heart:  Regular without murmurs Abd:  Abdomen is soft, non distended and non tender. No masses are palpable.  There is no rebound and no guarding.  Neurological: Alert and oriented to person, place, and time. Coordination normal.  Skin: Skin is warm and dry. No rash noted. No diaphoretic. No erythema. No pallor.  Psychiatric: Normal mood and affect. Normal behavior. Judgment and thought content normal.    ASSESSMENT: I have independently reviewed the patient's chest x-ray I do not see any acute surgical interventions required.  There are the typical postoperative changes only.  Of note is that her blood pressure is approximately 200/100 today.  She is on metoprolol.  She states that she has been taking her medications.   PLAN:   I told her to keep her appointment with Dr. Mike Gip in August.  I did not make a return visit for her at this point in time.  We did ask that she go by Dr. Myrtis Ser office and be checked for her  hypertension today.  She said she would do so.    Nestor Lewandowsky, MD

## 2017-06-10 NOTE — Patient Instructions (Signed)
We need for you to schedule an appointment with Dr.Mark [name] Heck regarding your blood pressure.   Please call if you have questions or concerns.

## 2017-08-17 DIAGNOSIS — I10 Essential (primary) hypertension: Secondary | ICD-10-CM | POA: Insufficient documentation

## 2017-09-07 ENCOUNTER — Ambulatory Visit
Admission: RE | Admit: 2017-09-07 | Discharge: 2017-09-07 | Disposition: A | Discharge status: Home / Self Care | Payer: Medicare HMO | Source: Ambulatory Visit | Attending: Urgent Care | Admitting: Urgent Care

## 2017-09-07 DIAGNOSIS — J432 Centrilobular emphysema: Secondary | ICD-10-CM | POA: Insufficient documentation

## 2017-09-07 DIAGNOSIS — I700 Atherosclerosis of aorta: Secondary | ICD-10-CM | POA: Diagnosis not present

## 2017-09-07 DIAGNOSIS — I2510 Atherosclerotic heart disease of native coronary artery without angina pectoris: Secondary | ICD-10-CM | POA: Insufficient documentation

## 2017-09-07 DIAGNOSIS — C3411 Malignant neoplasm of upper lobe, right bronchus or lung: Secondary | ICD-10-CM | POA: Diagnosis present

## 2017-09-07 DIAGNOSIS — C3432 Malignant neoplasm of lower lobe, left bronchus or lung: Secondary | ICD-10-CM | POA: Diagnosis not present

## 2017-09-09 ENCOUNTER — Other Ambulatory Visit: Payer: Self-pay | Admitting: Internal Medicine

## 2017-09-09 DIAGNOSIS — Z1231 Encounter for screening mammogram for malignant neoplasm of breast: Secondary | ICD-10-CM

## 2017-09-13 ENCOUNTER — Inpatient Hospital Stay: Payer: Medicare HMO | Attending: Hematology and Oncology

## 2017-09-13 ENCOUNTER — Other Ambulatory Visit: Payer: Self-pay

## 2017-09-13 ENCOUNTER — Inpatient Hospital Stay (HOSPITAL_BASED_OUTPATIENT_CLINIC_OR_DEPARTMENT_OTHER): Payer: Medicare HMO | Admitting: Hematology and Oncology

## 2017-09-13 ENCOUNTER — Encounter: Payer: Self-pay | Admitting: Hematology and Oncology

## 2017-09-13 VITALS — BP 157/88 | HR 66 | Temp 95.8°F | Resp 18 | Wt 144.5 lb

## 2017-09-13 DIAGNOSIS — C3432 Malignant neoplasm of lower lobe, left bronchus or lung: Secondary | ICD-10-CM | POA: Diagnosis not present

## 2017-09-13 DIAGNOSIS — G8929 Other chronic pain: Secondary | ICD-10-CM

## 2017-09-13 DIAGNOSIS — Z87891 Personal history of nicotine dependence: Secondary | ICD-10-CM | POA: Insufficient documentation

## 2017-09-13 DIAGNOSIS — C3411 Malignant neoplasm of upper lobe, right bronchus or lung: Secondary | ICD-10-CM | POA: Diagnosis not present

## 2017-09-13 DIAGNOSIS — Z79899 Other long term (current) drug therapy: Secondary | ICD-10-CM | POA: Insufficient documentation

## 2017-09-13 DIAGNOSIS — I10 Essential (primary) hypertension: Secondary | ICD-10-CM | POA: Insufficient documentation

## 2017-09-13 LAB — CBC WITH DIFFERENTIAL/PLATELET
Basophils Absolute: 0 10*3/uL (ref 0–0.1)
Basophils Relative: 1 %
Eosinophils Absolute: 0.2 10*3/uL (ref 0–0.7)
Eosinophils Relative: 4 %
HCT: 41.4 % (ref 35.0–47.0)
Hemoglobin: 14.3 g/dL (ref 12.0–16.0)
Lymphocytes Relative: 40 %
Lymphs Abs: 2 10*3/uL (ref 1.0–3.6)
MCH: 35.3 pg — ABNORMAL HIGH (ref 26.0–34.0)
MCHC: 34.5 g/dL (ref 32.0–36.0)
MCV: 102.4 fL — ABNORMAL HIGH (ref 80.0–100.0)
Monocytes Absolute: 0.4 10*3/uL (ref 0.2–0.9)
Monocytes Relative: 8 %
Neutro Abs: 2.3 10*3/uL (ref 1.4–6.5)
Neutrophils Relative %: 47 %
Platelets: 142 10*3/uL — ABNORMAL LOW (ref 150–440)
RBC: 4.04 MIL/uL (ref 3.80–5.20)
RDW: 13.5 % (ref 11.5–14.5)
WBC: 5 10*3/uL (ref 3.6–11.0)

## 2017-09-13 LAB — COMPREHENSIVE METABOLIC PANEL
ALT: 21 U/L (ref 0–44)
AST: 25 U/L (ref 15–41)
Albumin: 4.1 g/dL (ref 3.5–5.0)
Alkaline Phosphatase: 52 U/L (ref 38–126)
Anion gap: 8 (ref 5–15)
BUN: 27 mg/dL — ABNORMAL HIGH (ref 8–23)
CO2: 25 mmol/L (ref 22–32)
Calcium: 9.3 mg/dL (ref 8.9–10.3)
Chloride: 107 mmol/L (ref 98–111)
Creatinine, Ser: 0.81 mg/dL (ref 0.44–1.00)
GFR calc Af Amer: >60 mL/min (ref >60)
GFR calc non Af Amer: >60 mL/min (ref >60)
Glucose, Bld: 100 mg/dL — ABNORMAL HIGH (ref 70–99)
Potassium: 4 mmol/L (ref 3.5–5.1)
Sodium: 140 mmol/L (ref 135–145)
Total Bilirubin: 0.8 mg/dL (ref 0.3–1.2)
Total Protein: 6.8 g/dL (ref 6.5–8.1)

## 2017-09-13 NOTE — Progress Notes (Signed)
[facility] day:  09/13/2017    Chief Complaint: Heather [name] is a 82 y.o. 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  HPI: The patient was last seen in the medical oncology clinic on 03/31/2017.  At that time, she denied any complaints. She was healing well post surgery.  We discussed plans for follow-up imaging.  She was seen by Dr. Genevive Bi on 06/10/2017.  Notes reviewed.  She was doing well.  She had issues with hypertension (200/100).  She was to follow-up with Dr. Emily Filbert.  Chest CT on 09/07/2017 revealed stable postsurgical change in the RIGHT upper lobe and LEFT lower lobe without evidence local recurrence.  There was a stable 8 mm nodule in the central RIGHT upper lobe.  There was upper lobe centrilobular emphysema.  During the interim, patient is doing well overall. She denies any acute symptoms today. Patient denies any increased shortness of breath. Patient has intermittent issues with her back. Patient notes pain worsens when she bends over. She has had previous epidural steroid injections with Dr. Sharlet Salina. Last injection was a year ago and patient states, "that injection kept me from having to have surgery. I am afraid of surgery at my age. I would like to have another injection if possible".  Patient denies that she has experienced any B symptoms. She denies any interval infections. Patient advises that she maintains an adequate appetite. She is eating well. Weight today is 144 lb 8 oz (65.5 kg), which compared to her last visit to the clinic, represents a  1 pound increase.   Patient denies pain in the clinic today.   Past Medical History:  Diagnosis Date  . Brain aneurysm   . Cancer ([place]) lung   2016  . Carotid artery stenosis   . Carotid stenosis 06/19/2013   Overview:  Normal study,  1/16  . Colonic polyp   . GERD (gastroesophageal reflux disease)   . Hiatal hernia   . Hypercholesteremia   . Hyperlipidemia, mixed 06/09/2015  . Hypertension   . Lung nodule 06/21/2014  . Macrocytic 10/06/2014  . Medicare annual wellness visit, initial 07/08/2016   Overview:  6/18  . PAT (paroxysmal atrial tachycardia) ([place]) 06/19/2013  . Plantar fasciitis   . Tachycardia, paroxysmal ([place]) 03/31/14    Past Surgical History:  Procedure Laterality Date  . BREAST EXCISIONAL BIOPSY Right 40 yrs ago   neg  . BREAST LUMPECTOMY     benign  . CATARACT EXTRACTION W/ INTRAOCULAR LENS  IMPLANT, BILATERAL Bilateral   . CEREBRAL ANEURYSM REPAIR  2004  . CERVIX LESION DESTRUCTION    . COLONOSCOPY WITH PROPOFOL N/A 03/31/2015   Procedure: COLONOSCOPY WITH PROPOFOL;  Surgeon: Manya Silvas, MD;  Location: [facility] ENDOSCOPY;  Service: Endoscopy;  Laterality: N/A;  . FLEXIBLE BRONCHOSCOPY N/A 03/10/2017   Procedure: FLEXIBLE BRONCHOSCOPY;  Surgeon: Nestor Lewandowsky, MD;  Location: [facility] ORS;  Service: Thoracic;  Laterality: N/A;  . THORACOTOMY/LOBECTOMY Left 03/10/2017   Procedure: THORACOTOMY WITH LUNG WEDGE RESECTION POSSIBLE LOBECTOMY;  Surgeon: Nestor Lewandowsky, MD;  Location: [facility] ORS;  Service: Thoracic;  Laterality: Left;  . TUBAL LIGATION      Family History  Problem Relation Age of Onset  . Alcohol abuse Mother   . Heart attack Father   . Breast cancer Neg Hx  Social History:  reports that she quit smoking about 29 years ago. She quit after 35.00 years of use. She has never used smokeless tobacco. She reports that she drinks alcohol. She reports that she does not use drugs.  She lives in [place].  She is accompanied by her husband today.  Allergies:  Allergies  Allergen Reactions  . Ace Inhibitors Swelling  . Contrast Media [Iodinated Diagnostic Agents]   . Cortisone     Patient had facial swelling and itching from cortisone injection IM    Current Medications: Current Outpatient  Medications  Medication Sig Dispense Refill  . amLODipine (NORVASC) 5 MG tablet Take by mouth.    Marland Kitchen atenolol (TENORMIN) 50 MG tablet Take 50 mg by mouth at bedtime.    . cetirizine (ZYRTEC) 10 MG tablet Take 10 mg by mouth daily.    Marland Kitchen donepezil (ARICEPT) 5 MG tablet Take 5 mg by mouth at bedtime.     Marland Kitchen olmesartan-hydrochlorothiazide (BENICAR HCT) 20-12.5 MG tablet Take 1 tablet by mouth daily.     . simvastatin (ZOCOR) 20 MG tablet Take 20 mg by mouth daily.    Marland Kitchen ALPRAZolam (XANAX) 0.25 MG tablet Take 0.25 mg by mouth 2 (two) times daily as needed for anxiety.     Marland Kitchen amoxicillin (AMOXIL) 500 MG capsule     . Cholecalciferol 1000 UNITS tablet Take 1,000 Units by mouth daily.    . Coenzyme Q10 (CO Q 10) 100 MG CAPS Take 1 capsule by mouth daily.    . fluticasone (FLONASE) 50 MCG/ACT nasal spray Place 1 spray into the nose daily as needed for allergies.     . hydrochlorothiazide (HYDRODIURIL) 25 MG tablet Take 25 mg by mouth daily as needed.     Marland Kitchen oxyCODONE-acetaminophen (PERCOCET/ROXICET) 5-325 MG tablet Take 1-2 tablets by mouth every 4 (four) hours as needed for moderate pain. (Patient not taking: Reported on 09/13/2017) 30 tablet 0   No current facility-administered medications for this visit.    Facility-Administered Medications Ordered in Other Visits  Medication Dose Route Frequency Provider Last Rate Last Dose  . 0.9 %  sodium chloride infusion   Intravenous Continuous Manya Silvas, MD        Review of Systems  Constitutional: Negative for diaphoresis, fever, malaise/fatigue and weight loss.  HENT: Negative.   Eyes: Negative.   Respiratory: Negative for cough, hemoptysis, sputum production and shortness of breath.   Cardiovascular: Negative for chest pain, palpitations, orthopnea, leg swelling and PND.  Gastrointestinal: Negative for abdominal pain, blood in stool, constipation, diarrhea, melena, nausea and vomiting.  Genitourinary: Negative for dysuria, frequency, hematuria and  urgency.  Musculoskeletal: Positive for back pain (chronic - known ruptured disc). Negative for falls, joint pain and myalgias.  Skin: Negative for itching and rash.  Neurological: Negative for dizziness, tremors, weakness and headaches.  Endo/Heme/Allergies: Does not bruise/bleed easily.  Psychiatric/Behavioral: Negative for depression, memory loss and suicidal ideas. The patient is not nervous/anxious and does not have insomnia.   All other systems reviewed and are negative.  Performance status (ECOG): 0 - Asymptomatic  Vital Signs BP (!) 157/88 (BP Location: Left Arm, Patient Position: Sitting)   Pulse 66   Temp (!) 95.8 F (35.4 C) (Tympanic)   Resp 18   Wt 144 lb 8 oz (65.5 kg)   BMI 26.43 kg/m   Physical Exam  Constitutional: She is oriented to person, place, and time and well-developed, well-nourished, and in no distress.  HENT:  Head: Normocephalic and  atraumatic.  [place] hair  Eyes: Pupils are equal, round, and reactive to light. EOM are normal. No scleral icterus.  Glasses. Brown eyes.   Neck: Normal range of motion. Neck supple. No tracheal deviation present. No thyromegaly present.  Cardiovascular: Normal rate, regular rhythm and normal heart sounds. Exam reveals no gallop and no friction rub.  No murmur heard. Pulmonary/Chest: Effort normal and breath sounds normal. No respiratory distress. She has no wheezes. She has no rales.  Abdominal: Soft. Bowel sounds are normal. She exhibits no distension. There is no tenderness.  Musculoskeletal: Normal range of motion. She exhibits no edema or tenderness.  Lymphadenopathy:    She has no cervical adenopathy.    She has no axillary adenopathy.       Right: No inguinal and no supraclavicular adenopathy present.       Left: No inguinal and no supraclavicular adenopathy present.  Neurological: She is alert and oriented to person, place, and time.  Skin: Skin is warm and dry. No rash noted. No erythema.  Psychiatric: Mood,  affect and judgment normal.  Nursing note and vitals reviewed.   Imaging studies: 02/13/2014:  PET scan revealed a 1 cm right upper lobe hypermetabolic nodule (SUV 3.8) and a 6 mm right upper lobe nodule (no metabolic activity). There was no mediastianl adenopathy.  09/27/2014:  Chest CT revealed images consistent with right upper lobe wedge resection.  There was slight pleural parenchymal nodular thickening and mild distortion consistent with post operative scarring.   04/28/2015:  Chest CT revealed stable postoperative findings and scarring in the right lung.  There was slight nodularity along the wedge resection site.  There was a 7 x 6 mm right upper lobe nodule (unchanged from 02/05/2014).  10/28/2015:  Chest CT revealed no evidence of lung cancer recurrence following right upper lobe wedge resection.  There was stable 7 mm perihilar pulmonary nodule in the right upper lobe.   04/30/2016:  Chest CT revealed stable surgical changes involving the right upper lung from a prior wedge resection.  There was no findings for recurrent tumor, mediastinal/hilar adenopathy or pulmonary metastatic disease.  There was a stable 7 mm superior segment right lower lobe pulmonary nodule.  The left lower lobe 6 mm pulmonary nodule appeared slightly more solid and nodular when compared to prior examination. 08/02/2016:  Chest CT revealed stable surgical changes right lung and a stable 7 mm right retro hilar nodule.  The 7 mm posterior left lower lobe nodule was not substantially changed from 6 mm on the prior study. When comparing back to older exam of 10/28/2015, this was more clearly progressed over that timeframe. 02/10/2017:  Chest CT revealed clear interval progression of the posterior left lower lobe pulmonary nodule, now measuring 12 mm in long axis and having a distinctly lobular contour. Imaging features were very concerning for neoplasm. Lesion position in the posterior lung base makes it susceptible to motion  artifact on PET imaging, but at it's current size, PET imaging may well prove helpful to further evaluate.  There was no change in the 7 mm nodule posterior to the right hilum. 02/18/2017:  PET scan revealed a 11 x 8 mm posterior left lower lobe pulmonary nodule that had mildly progressed from prior studies and demonstrated mild hypermetabolism (SUV 1.6).  The appearance was considered worrisome for an indolent primary bronchogenic neoplasm.  Metastasis was considered unlikely.   Appointment on 09/13/2017  Component Date Value Ref Range Status  . Sodium 09/13/2017 140  135 -  145 mmol/L Final  . Potassium 09/13/2017 4.0  3.5 - 5.1 mmol/L Final  . Chloride 09/13/2017 107  98 - 111 mmol/L Final  . CO2 09/13/2017 25  22 - 32 mmol/L Final  . Glucose, Bld 09/13/2017 100* 70 - 99 mg/dL Final  . BUN 09/13/2017 27* 8 - 23 mg/dL Final  . Creatinine, Ser 09/13/2017 0.81  0.44 - 1.00 mg/dL Final  . Calcium 09/13/2017 9.3  8.9 - 10.3 mg/dL Final  . Total Protein 09/13/2017 6.8  6.5 - 8.1 g/dL Final  . Albumin 09/13/2017 4.1  3.5 - 5.0 g/dL Final  . AST 09/13/2017 25  15 - 41 U/L Final  . ALT 09/13/2017 21  0 - 44 U/L Final  . Alkaline Phosphatase 09/13/2017 52  38 - 126 U/L Final  . Total Bilirubin 09/13/2017 0.8  0.3 - 1.2 mg/dL Final  . GFR calc non Af Amer 09/13/2017 >60  >60 mL/min Final  . GFR calc Af Amer 09/13/2017 >60  >60 mL/min Final   Comment: (NOTE) The eGFR has been calculated using the CKD EPI equation. This calculation has not been validated in all clinical situations. eGFR's persistently <60 mL/min signify possible Chronic Kidney Disease.   Heather [name] gap 09/13/2017 8  5 - 15 Final   Performed at [facility], [address] [place], [place] [address]  . WBC 09/13/2017 5.0  3.6 - 11.0 K/uL Final  . RBC 09/13/2017 4.04  3.80 - 5.20 MIL/uL Final  . Hemoglobin 09/13/2017 14.3  12.0 - 16.0 g/dL Final  . HCT 09/13/2017 41.4  35.0 - 47.0 % Final  . MCV 09/13/2017 102.4* 80.0 -  100.0 fL Final  . MCH 09/13/2017 35.3* 26.0 - 34.0 pg Final  . MCHC 09/13/2017 34.5  32.0 - 36.0 g/dL Final  . RDW 09/13/2017 13.5  11.5 - 14.5 % Final  . Platelets 09/13/2017 142* 150 - 440 K/uL Final  . Neutrophils Relative % 09/13/2017 47  % Final  . Neutro Abs 09/13/2017 2.3  1.4 - 6.5 K/uL Final  . Lymphocytes Relative 09/13/2017 40  % Final  . Lymphs Abs 09/13/2017 2.0  1.0 - 3.6 K/uL Final  . Monocytes Relative 09/13/2017 8  % Final  . Monocytes Absolute 09/13/2017 0.4  0.2 - 0.9 K/uL Final  . Eosinophils Relative 09/13/2017 4  % Final  . Eosinophils Absolute 09/13/2017 0.2  0 - 0.7 K/uL Final  . Basophils Relative 09/13/2017 1  % Final  . Basophils Absolute 09/13/2017 0.0  0 - 0.1 K/uL Final   Performed at [facility], [address] [place], [place] [address]    Assessment:  Heather [name] is a 83 y.o. 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 Pathology revealed a 1.1 cm high-grade adenocarcinoma with pleural invasion. Nodes were negative. Pathologic stage was T2aN1M0 (stage IB).  She is s/p wedge resection of a left lower lobe mass on 03/10/2017.  Pathology revealed an 8 mm invasive adenocarcinoma with solid and acinar patterns.  There was no visceral invasion.  There was no lymphovascular invasion.  All margins were negative.  Pathologic stage was T1aNx (stage IA).  PET scan on 02/13/2014 revealed a 1 cm right upper lobe hypermetabolic nodule (SUV 3.8) and a 6 mm right upper lobe nodule (no metabolic activity). There was no mediastianl adenopathy.   Chest CT on 02/10/2017 revealed clear interval progression of the posterior left lower lobe pulmonary nodule, now measuring  12 mm in long axis and having a distinctly lobular contour. Imaging features were very concerning for neoplasm. Lesion position in the posterior lung base makes it susceptible to motion artifact on PET imaging, but at it's current size, PET imaging  may well prove helpful to further evaluate.  There was no change in the 7 mm nodule posterior to the right hilum.  PET scan on 02/18/2017 revealed a 11 x 8 mm posterior left lower lobe pulmonary nodule that had mildly progressed from prior studies and demonstrated mild hypermetabolism (SUV 1.6).  The appearance was considered worrisome for an indolent primary bronchogenic neoplasm.  Chest CT on 09/07/2017 revealed stable postsurgical change in the RIGHT upper lobe and LEFT lower lobe without evidence local recurrence.  There was a stable 8 mm nodule in the central RIGHT upper lobe.  There was upper lobe centrilobular emphysema.  CEA has been followed: 4.7 on 10/01/2014, 4.5 on 07/31/2015, 3.9 on 05/03/2016, and 3.9 on 02/11/2017.  Bilateral mammogram on 07/22/2016 revealed no evidence of malignancy.  She had a UTI which caused confusion.  She had shingles.  Symptomatically, patient is doing well. She denies any acute concerns. Patient has chronic back pain that has been treated with epidural injections in the past. Exam is stable.   Plan: 1. Labs today:  CBC with diff, CMP. 2. Adenocarcinoma of the lung s/p wedge resection x 2 - stable  Doing well. No acute respiratory issues. No interval hospitalizations.   Review interval imaging. Images personally reviewed and felt to be consistent with dictated radiology report.   Schedule follow up chest CT wo contrast on 03/10/2018. 3. Back pain - chronic  Has been seen by Dr. Sharlet Salina in the past for the same. She was treated with epidural pain injections. Would like to discuss having a repeat injection in efforts to prevent need for surgery.   Encouraged to follow up with Dr. Sharlet Salina for ongoing evaluation and treatment.  4. RTC after CT scan for MD assessment and labs (CBC with diff, CMP).   Heather Loh, NP 09/13/2017, 11:25 AM   I saw and evaluated the patient, participating in the key portions of the service and reviewing pertinent diagnostic  studies and records.  I reviewed the nurse practitioner's note and agree with the findings and the plan.  The assessment and plan were discussed with the patient.  A few questions were asked by the patient and answered.   Nolon Stalls, MD 09/13/2017,11:25 AM

## 2017-09-13 NOTE — Patient Instructions (Signed)
Previous epidural injections were given by Dr. Sharlet Salina at [facility]. Call and request a follow up appointment for consideration of repeat injections.   Respectfully, Honor Loh, MSN, APRN, FNP-C, CEN Oncology/Hematology Nurse Practitioner  [place]

## 2017-09-13 NOTE — Progress Notes (Signed)
Here for follow up. Per pt " today I feel really good "

## 2017-09-28 ENCOUNTER — Ambulatory Visit
Admission: RE | Admit: 2017-09-28 | Discharge: 2017-09-28 | Disposition: A | Discharge status: Home / Self Care | Payer: Medicare HMO | Source: Ambulatory Visit | Attending: Internal Medicine | Admitting: Internal Medicine

## 2017-09-28 DIAGNOSIS — Z1231 Encounter for screening mammogram for malignant neoplasm of breast: Secondary | ICD-10-CM | POA: Diagnosis not present

## 2018-02-10 DIAGNOSIS — E782 Mixed hyperlipidemia: Secondary | ICD-10-CM | POA: Diagnosis not present

## 2018-02-17 DIAGNOSIS — E782 Mixed hyperlipidemia: Secondary | ICD-10-CM | POA: Diagnosis not present

## 2018-02-17 DIAGNOSIS — M5136 Other intervertebral disc degeneration, lumbar region: Secondary | ICD-10-CM | POA: Diagnosis not present

## 2018-02-17 DIAGNOSIS — I471 Supraventricular tachycardia: Secondary | ICD-10-CM | POA: Diagnosis not present

## 2018-03-10 ENCOUNTER — Ambulatory Visit
Admission: RE | Admit: 2018-03-10 | Discharge: 2018-03-10 | Disposition: A | Discharge status: Home / Self Care | Payer: PPO | Source: Ambulatory Visit | Attending: Urgent Care | Admitting: Urgent Care

## 2018-03-10 DIAGNOSIS — C3411 Malignant neoplasm of upper lobe, right bronchus or lung: Secondary | ICD-10-CM | POA: Diagnosis not present

## 2018-03-10 DIAGNOSIS — C3432 Malignant neoplasm of lower lobe, left bronchus or lung: Secondary | ICD-10-CM | POA: Diagnosis not present

## 2018-03-10 DIAGNOSIS — R911 Solitary pulmonary nodule: Secondary | ICD-10-CM | POA: Diagnosis not present

## 2018-03-14 ENCOUNTER — Other Ambulatory Visit: Payer: Medicare HMO

## 2018-03-14 ENCOUNTER — Ambulatory Visit: Payer: Medicare HMO | Admitting: Oncology

## 2018-03-15 ENCOUNTER — Other Ambulatory Visit: Payer: Self-pay

## 2018-03-15 ENCOUNTER — Encounter: Payer: Self-pay | Admitting: Oncology

## 2018-03-15 ENCOUNTER — Inpatient Hospital Stay (HOSPITAL_BASED_OUTPATIENT_CLINIC_OR_DEPARTMENT_OTHER): Payer: PPO | Admitting: Oncology

## 2018-03-15 ENCOUNTER — Inpatient Hospital Stay: Payer: PPO | Attending: Oncology

## 2018-03-15 ENCOUNTER — Telehealth: Payer: Self-pay | Admitting: *Deleted

## 2018-03-15 VITALS — BP 147/72 | HR 64 | Temp 96.7°F | Resp 18 | Wt 144.9 lb

## 2018-03-15 DIAGNOSIS — D696 Thrombocytopenia, unspecified: Secondary | ICD-10-CM | POA: Insufficient documentation

## 2018-03-15 DIAGNOSIS — Z79899 Other long term (current) drug therapy: Secondary | ICD-10-CM | POA: Diagnosis not present

## 2018-03-15 DIAGNOSIS — C3411 Malignant neoplasm of upper lobe, right bronchus or lung: Secondary | ICD-10-CM

## 2018-03-15 DIAGNOSIS — I10 Essential (primary) hypertension: Secondary | ICD-10-CM | POA: Insufficient documentation

## 2018-03-15 DIAGNOSIS — Z791 Long term (current) use of non-steroidal anti-inflammatories (NSAID): Secondary | ICD-10-CM

## 2018-03-15 DIAGNOSIS — R05 Cough: Secondary | ICD-10-CM

## 2018-03-15 DIAGNOSIS — Z85118 Personal history of other malignant neoplasm of bronchus and lung: Secondary | ICD-10-CM

## 2018-03-15 DIAGNOSIS — E782 Mixed hyperlipidemia: Secondary | ICD-10-CM

## 2018-03-15 DIAGNOSIS — C3432 Malignant neoplasm of lower lobe, left bronchus or lung: Secondary | ICD-10-CM | POA: Insufficient documentation

## 2018-03-15 DIAGNOSIS — Z87891 Personal history of nicotine dependence: Secondary | ICD-10-CM | POA: Diagnosis not present

## 2018-03-15 DIAGNOSIS — R059 Cough, unspecified: Secondary | ICD-10-CM

## 2018-03-15 LAB — CBC WITH DIFFERENTIAL/PLATELET
Abs Immature Granulocytes: 0.01 10*3/uL (ref 0.00–0.07)
Basophils Absolute: 0 10*3/uL (ref 0.0–0.1)
Basophils Relative: 0 %
Eosinophils Absolute: 0.3 10*3/uL (ref 0.0–0.5)
Eosinophils Relative: 4 %
HCT: 42.7 % (ref 36.0–46.0)
Hemoglobin: 14.4 g/dL (ref 12.0–15.0)
Immature Granulocytes: 0 %
Lymphocytes Relative: 38 %
Lymphs Abs: 2.7 10*3/uL (ref 0.7–4.0)
MCH: 33.3 pg (ref 26.0–34.0)
MCHC: 33.7 g/dL (ref 30.0–36.0)
MCV: 98.8 fL (ref 80.0–100.0)
Monocytes Absolute: 0.5 10*3/uL (ref 0.1–1.0)
Monocytes Relative: 8 %
Neutro Abs: 3.5 10*3/uL (ref 1.7–7.7)
Neutrophils Relative %: 50 %
Platelets: 146 10*3/uL — ABNORMAL LOW (ref 150–400)
RBC: 4.32 MIL/uL (ref 3.87–5.11)
RDW: 12.3 % (ref 11.5–15.5)
WBC: 7 10*3/uL (ref 4.0–10.5)
nRBC: 0 % (ref 0.0–0.2)

## 2018-03-15 LAB — COMPREHENSIVE METABOLIC PANEL
ALT: 21 U/L (ref 0–44)
AST: 27 U/L (ref 15–41)
Albumin: 4.1 g/dL (ref 3.5–5.0)
Alkaline Phosphatase: 56 U/L (ref 38–126)
Anion gap: 9 (ref 5–15)
BUN: 25 mg/dL — ABNORMAL HIGH (ref 8–23)
CO2: 29 mmol/L (ref 22–32)
Calcium: 9.2 mg/dL (ref 8.9–10.3)
Chloride: 104 mmol/L (ref 98–111)
Creatinine, Ser: 0.8 mg/dL (ref 0.44–1.00)
GFR calc Af Amer: >60 mL/min (ref >60)
GFR calc non Af Amer: >60 mL/min (ref >60)
Glucose, Bld: 101 mg/dL — ABNORMAL HIGH (ref 70–99)
Potassium: 4.4 mmol/L (ref 3.5–5.1)
Sodium: 142 mmol/L (ref 135–145)
Total Bilirubin: 0.8 mg/dL (ref 0.3–1.2)
Total Protein: 6.9 g/dL (ref 6.5–8.1)

## 2018-03-15 NOTE — Progress Notes (Signed)
Patient here for follow up. Pt has had cough for about 2 weeks, sometimes its productive.

## 2018-03-15 NOTE — Progress Notes (Signed)
[facility] day:  03/15/2018    Chief Complaint: Heather [name] is a 83 y.o. 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  PERTINENT ONCOLOGY HISTORY Previously follows up with Dr.Corcoran. [place] care with me on 03/15/2018  PET scan on 02/13/2014 revealed a 1 cm right upper lobe hypermetabolic nodule (SUV 3.8) and a 6 mm right upper lobe nodule (no metabolic activity). There was no mediastianl adenopathy.  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 Pathology revealed a 1.1 cm high-grade adenocarcinoma with pleural invasion. Nodes were negative. Pathologic stage was T2aN1M0 (stage IB).  Chest CT on 02/10/2017 revealed clear interval progression of the posterior left lower lobe pulmonary nodule, now measuring 12 mm in long axis and having a distinctly lobular contour. Imaging features were very concerning for neoplasm.  There was no change in the 7 mm nodule posterior to the right hilum.  PET scan on 02/18/2017 revealed a 11 x 8 mm posterior left lower lobe pulmonary nodule that had mildly progressed from prior studies and demonstrated mild hypermetabolism (SUV 1.6).  The appearance was considered worrisome for an indolent primary bronchogenic neoplasm.  03/10/2017.  s/p wedge resection of a left lower lobe mass.   Pathology revealed an 8 mm invasive adenocarcinoma with solid and acinar patterns.  There was no visceral invasion.  There was no lymphovascular invasion.  All margins were negative.  Pathologic stage was T1aNx (stage IA).   Chest CT on 09/07/2017 revealed stable postsurgical change in the RIGHT upper lobe and LEFT lower lobe without evidence local recurrence.  There was a stable 8 mm nodule in the central RIGHT upper lobe.  There was upper lobe  centrilobular emphysema.  CEA has been followed: 4.7 on 10/01/2014, 4.5 on 07/31/2015, 3.9 on 05/03/2016, and 3.9 on 02/11/2017.  Bilateral mammogram on 07/22/2016 revealed no evidence of malignancy.   INTERVAL HISTORY Heather [name] is a 83 y.o. female who has above history reviewed by me today presents for follow up visit for management of history of lung cancer, 6 months assessment.  Problems and complaints are listed below:  During the interim, patient has CT scan done.  Doing well at baseline.  She had cough for about 2 weeks, feels like catching a virus infection.  Denies fever, chill,SOB, chest pain, hemoptysis.  Chronic intermittent back pain.  Symptom previous improved after epidural steroid injections, she is considering another epidural steroid injection.   Appetite is good. Weight is stable.  Patient denies pain in the clinic today.   Past Medical History:  Diagnosis Date  . Brain aneurysm   . Cancer ([place]) lung   2016  . Carotid artery stenosis   . Carotid stenosis 06/19/2013   Overview:  Normal study, 1/16  . Colonic polyp   . GERD (gastroesophageal reflux disease)   . Hiatal hernia   . Hypercholesteremia   . Hyperlipidemia, mixed 06/09/2015  . Hypertension   . Lung nodule 06/21/2014  . Macrocytic 10/06/2014  . Medicare annual wellness visit, initial 07/08/2016   Overview:  6/18  . PAT (paroxysmal atrial tachycardia) ([place]) 06/19/2013  . Plantar fasciitis   . Tachycardia, paroxysmal ([place]) 03/31/14    Past Surgical History:  Procedure Laterality Date  . BREAST EXCISIONAL BIOPSY Right 40 yrs ago   neg  .  BREAST LUMPECTOMY     benign  . CATARACT EXTRACTION W/ INTRAOCULAR LENS  IMPLANT, BILATERAL Bilateral   . CEREBRAL ANEURYSM REPAIR  2004  . CERVIX LESION DESTRUCTION    . COLONOSCOPY WITH PROPOFOL N/A 03/31/2015   Procedure: COLONOSCOPY WITH PROPOFOL;  Surgeon: Manya Silvas, MD;  Location[facility] END[facility]COPY;  Service: Endoscopy;  Laterality: N/A;  . FLEXIBLE  BRONCHOSCOPY N/A 03/10/2017   Procedure: FLEXIBLE BRONCHOSCOPY;  Surgeon: Nestor Lewandowsky, MD;  Location: [facility] ORS;  Service: Thoracic;  Laterality: N/A;  . THORACOTOMY/LOBECTOMY Left 03/10/2017   Procedure: THORACOTOMY WITH LUNG WEDGE RESECTION POSSIBLE LOBECTOMY;  Surgeon: Nestor Lewandowsky, MD;  Location: [facility] ORS;  Service: Thoracic;  Laterality: Left;  . TUBAL LIGATION      Family History  Problem Relation Age of Onset  . Alcohol abuse Mother   . Heart attack Father   . Breast cancer Neg Hx     Social History:  reports that she quit smoking about 29 years ago. She quit after 35.00 years of use. She has never used smokeless tobacco. She reports current alcohol use. She reports that she does not use drugs.  She lives in [place].  She is accompanied by her husband today.  Allergies:  Allergies  Allergen Reactions  . Ace Inhibitors Swelling  . Contrast Media [Iodinated Diagnostic Agents]   . Cortisone     Patient had facial swelling and itching from cortisone injection IM    Current Medications: Current Outpatient Medications  Medication Sig Dispense Refill  . amLODipine (NORVASC) 5 MG tablet Take by mouth.    Marland Kitchen atenolol (TENORMIN) 50 MG tablet Take 50 mg by mouth at bedtime.    . cetirizine (ZYRTEC) 10 MG tablet Take 10 mg by mouth daily.    Marland Kitchen donepezil (ARICEPT) 5 MG tablet Take 5 mg by mouth at bedtime.     . hydrochlorothiazide (HYDRODIURIL) 25 MG tablet Take 25 mg by mouth daily as needed.     . meloxicam (MOBIC) 7.5 MG tablet Take 7.5 mg by mouth daily.    . simvastatin (ZOCOR) 20 MG tablet Take 20 mg by mouth daily.    Marland Kitchen ALPRAZolam (XANAX) 0.25 MG tablet Take 0.25 mg by mouth 2 (two) times daily as needed for anxiety.     Marland Kitchen amoxicillin (AMOXIL) 500 MG capsule     . Cholecalciferol 1000 UNITS tablet Take 1,000 Units by mouth daily.    . Coenzyme Q10 (CO Q 10) 100 MG CAPS Take 1 capsule by mouth daily.    . fluticasone (FLONASE) 50 MCG/ACT nasal spray Place 1 spray into the  nose daily as needed for allergies.     Marland Kitchen olmesartan-hydrochlorothiazide (BENICAR HCT) 20-12.5 MG tablet Take 1 tablet by mouth daily.     Marland Kitchen oxyCODONE-acetaminophen (PERCOCET/ROXICET) 5-325 MG tablet Take 1-2 tablets by mouth every 4 (four) hours as needed for moderate pain. (Patient not taking: Reported on 03/15/2018) 30 tablet 0   No current facility-administered medications for this visit.    Facility-Administered Medications Ordered in Other Visits  Medication Dose Route Frequency Provider Last Rate Last Dose  . 0.9 %  sodium chloride infusion   Intravenous Continuous Manya Silvas, MD        Review of Systems  Constitutional: Negative for chills, diaphoresis, fever, malaise/fatigue and weight loss.  HENT: Negative for sore throat.   Eyes: Negative for redness.  Respiratory: Negative for cough, hemoptysis, sputum production, shortness of breath and wheezing.   Cardiovascular: Negative for chest pain,  palpitations, orthopnea, leg swelling and PND.  Gastrointestinal: Negative for abdominal pain, blood in stool, constipation, diarrhea, melena, nausea and vomiting.  Genitourinary: Negative for dysuria, frequency, hematuria and urgency.  Musculoskeletal: Positive for back pain (chronic - known ruptured disc). Negative for falls, joint pain and myalgias.  Skin: Negative for itching and rash.  Neurological: Negative for dizziness, tingling, tremors, weakness and headaches.  Endo/Heme/Allergies: Does not bruise/bleed easily.  Psychiatric/Behavioral: Negative for depression, hallucinations, memory loss and suicidal ideas. The patient is not nervous/anxious and does not have insomnia.    Performance status (ECOG): 0 - Asymptomatic  Vital Signs BP (!) 147/72 (BP Location: Left Arm)   Pulse 64   Temp (!) 96.7 F (35.9 C) (Tympanic)   Resp 18   Wt 144 lb 14.4 oz (65.7 kg)   BMI 26.50 kg/m   Physical Exam  Constitutional: She is oriented to person, place, and time and well-developed,  well-nourished, and in no distress. No distress.  HENT:  Head: Normocephalic and atraumatic.  Nose: Nose normal.  Mouth/Throat: Oropharynx is clear and moist. No oropharyngeal exudate.  [place] hair  Eyes: Pupils are equal, round, and reactive to light. EOM are normal. No scleral icterus.  Glasses. Brown eyes.   Neck: Normal range of motion. Neck supple. No tracheal deviation present. No thyromegaly present.  Cardiovascular: Normal rate, regular rhythm and normal heart sounds. Exam reveals no gallop and no friction rub.  No murmur heard. Pulmonary/Chest: Effort normal and breath sounds normal. No respiratory distress. She has no wheezes. She has no rales. She exhibits no tenderness.  Abdominal: Soft. Bowel sounds are normal. She exhibits no distension. There is no abdominal tenderness.  Musculoskeletal: Normal range of motion.        General: No tenderness or edema.  Lymphadenopathy:    She has no cervical adenopathy.    She has no axillary adenopathy.       Right: No inguinal and no supraclavicular adenopathy present.       Left: No inguinal and no supraclavicular adenopathy present.  Neurological: She is alert and oriented to person, place, and time. No cranial nerve deficit. She exhibits normal muscle tone. Coordination normal.  Skin: Skin is warm and dry. No rash noted. She is not diaphoretic. No erythema.  Psychiatric: Mood, affect and judgment normal.  Nursing note and vitals reviewed.   Imaging studies: 02/13/2014:  PET scan revealed a 1 cm right upper lobe hypermetabolic nodule (SUV 3.8) and a 6 mm right upper lobe nodule (no metabolic activity). There was no mediastianl adenopathy.  09/27/2014:  Chest CT revealed images consistent with right upper lobe wedge resection.  There was slight pleural parenchymal nodular thickening and mild distortion consistent with post operative scarring.   04/28/2015:  Chest CT revealed stable postoperative findings and scarring in the right lung.   There was slight nodularity along the wedge resection site.  There was a 7 x 6 mm right upper lobe nodule (unchanged from 02/05/2014).  10/28/2015:  Chest CT revealed no evidence of lung cancer recurrence following right upper lobe wedge resection.  There was stable 7 mm perihilar pulmonary nodule in the right upper lobe.   04/30/2016:  Chest CT revealed stable surgical changes involving the right upper lung from a prior wedge resection.  There was no findings for recurrent tumor, mediastinal/hilar adenopathy or pulmonary metastatic disease.  There was a stable 7 mm superior segment right lower lobe pulmonary nodule.  The left lower lobe 6 mm pulmonary nodule appeared slightly  more solid and nodular when compared to prior examination. 08/02/2016:  Chest CT revealed stable surgical changes right lung and a stable 7 mm right retro hilar nodule.  The 7 mm posterior left lower lobe nodule was not substantially changed from 6 mm on the prior study. When comparing back to older exam of 10/28/2015, this was more clearly progressed over that timeframe. 02/10/2017:  Chest CT revealed clear interval progression of the posterior left lower lobe pulmonary nodule, now measuring 12 mm in long axis and having a distinctly lobular contour. Imaging features were very concerning for neoplasm. Lesion position in the posterior lung base makes it susceptible to motion artifact on PET imaging, but at it's current size, PET imaging may well prove helpful to further evaluate.  There was no change in the 7 mm nodule posterior to the right hilum. 02/18/2017:  PET scan revealed a 11 x 8 mm posterior left lower lobe pulmonary nodule that had mildly progressed from prior studies and demonstrated mild hypermetabolism (SUV 1.6).  The appearance was considered worrisome for an indolent primary bronchogenic neoplasm.  Metastasis was considered unlikely. 03/10/2018 CT chest wo contrast Surgical changes of right upper and left lower lobe wedge  resection, without locally recurrent or metastatic disease. 2. Similar central right upper lobe 8 mm pulmonary nodule. 3. Aortic atherosclerosis (ICD10-I70.0), coronary artery atherosclerosis and emphysema (ICD10-J43.9).   Recent Results (from the past 2160 hour(s))  Comprehensive metabolic panel     Status: Abnormal   Collection Time: 03/15/18  7:57 AM  Result Value Ref Range   Sodium 142 135 - 145 mmol/L   Potassium 4.4 3.5 - 5.1 mmol/L   Chloride 104 98 - 111 mmol/L   CO2 29 22 - 32 mmol/L   Glucose, Bld 101 (H) 70 - 99 mg/dL   BUN 25 (H) 8 - 23 mg/dL   Creatinine, Ser 0.80 0.44 - 1.00 mg/dL   Calcium 9.2 8.9 - 10.3 mg/dL   Total Protein 6.9 6.5 - 8.1 g/dL   Albumin 4.1 3.5 - 5.0 g/dL   AST 27 15 - 41 U/L   ALT 21 0 - 44 U/L   Alkaline Phosphatase 56 38 - 126 U/L   Total Bilirubin 0.8 0.3 - 1.2 mg/dL   GFR calc non Af Amer >60 >60 mL/min   GFR calc Af Amer >60 >60 mL/min   Anion gap 9 5 - 15    Comment: Performed at [facility], [address] [place], [place] [address]  CBC with Differential     Status: Abnormal   Collection Time: 03/15/18  7:57 AM  Result Value Ref Range   WBC 7.0 4.0 - 10.5 K/uL   RBC 4.32 3.87 - 5.11 MIL/uL   Hemoglobin 14.4 12.0 - 15.0 g/dL   HCT 42.7 36.0 - 46.0 %   MCV 98.8 80.0 - 100.0 fL   MCH 33.3 26.0 - 34.0 pg   MCHC 33.7 30.0 - 36.0 g/dL   RDW 12.3 11.5 - 15.5 %   Platelets 146 (L) 150 - 400 K/uL   nRBC 0.0 0.0 - 0.2 %   Neutrophils Relative % 50 %   Neutro Abs 3.5 1.7 - 7.7 K/uL   Lymphocytes Relative 38 %   Lymphs Abs 2.7 0.7 - 4.0 K/uL   Monocytes Relative 8 %   Monocytes Absolute 0.5 0.1 - 1.0 K/uL   Eosinophils Relative 4 %   Eosinophils Absolute 0.3 0.0 - 0.5 K/uL   Basophils Relative 0 %  Basophils Absolute 0.0 0.0 - 0.1 K/uL   Immature Granulocytes 0 %   Abs Immature Granulocytes 0.01 0.00 - 0.07 K/uL    Comment: Performed at [facility], [address], [place], [place] [address]    Assessment:  Heather [name] is a 83 y.o. 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 Pathology revealed a 1.1 cm high-grade adenocarcinoma with pleural invasion. Nodes were negative. Pathologic stage was T2aN1M0 (stage IB).  She is s/p wedge resection of a left lower lobe mass on 03/10/2017.  Pathology revealed an 8 mm invasive adenocarcinoma with solid and acinar patterns.  There was no visceral invasion.  There was no lymphovascular invasion.  All margins were negative.  Pathologic stage was T1aNx (stage IA). 1. History of lung cancer   2. Primary cancer of right upper lobe of lung ([place])   3. Primary cancer of left lower lobe of lung ([place])   4. Thrombocytopenia ([place])   5. Cough    Clinically doing well.  CT chest wo on 03/10/2018 was independently reviewed and discussed with patient.   locally recurrent or metastatic disease. Continue CT chest surveillance Q6 months.   Chronic back pain, follow up with Dr. Sharlet Salina, considering for epidural steroid injection.  Thrombocytopenia, chornic, at baseline. Likely due to chronic alcohol consumption.  Advise patient to take oral vitamin B12 supplement and also cut down alcohol use.   Cough, likely due to URI, allergy. Advise patient to update me if symptom is persistent.   RTC after CT scan for MD assessment and labs (CBC with diff, CMP, B12, folate). Orders Placed This Encounter  Procedures  . CT Chest Wo Contrast    Standing Status:   Future    Standing Expiration Date:   03/15/2019    Order Specific Question:   ** REASON FOR EXAM (FREE TEXT)    Answer:   History of lung cancer    Order Specific Question:   Preferred imaging location?    Answer:   [facility]    Order Specific Question:   Radiology Contrast Protocol - do NOT remove file path    Answer:   [email or web address]  . CBC with Differential/Platelet    Standing Status:   Future    Standing Expiration Date:    03/16/2019  . Comprehensive metabolic panel    Standing Status:   Future    Standing Expiration Date:   03/16/2019  . Vitamin B12    Standing Status:   Future    Standing Expiration Date:   11/13/2018    Earlie Server, MD, PhD Hematology Oncology [facility] at [facility] Pager- [phone] 03/15/2018

## 2018-05-09 DIAGNOSIS — Z5181 Encounter for therapeutic drug level monitoring: Secondary | ICD-10-CM | POA: Diagnosis not present

## 2018-05-09 DIAGNOSIS — Z79899 Other long term (current) drug therapy: Secondary | ICD-10-CM | POA: Diagnosis not present

## 2018-05-09 DIAGNOSIS — C3432 Malignant neoplasm of lower lobe, left bronchus or lung: Secondary | ICD-10-CM | POA: Diagnosis not present

## 2018-05-09 DIAGNOSIS — J45902 Unspecified asthma with status asthmaticus: Secondary | ICD-10-CM | POA: Diagnosis not present

## 2018-05-09 DIAGNOSIS — R05 Cough: Secondary | ICD-10-CM | POA: Diagnosis not present

## 2018-05-09 DIAGNOSIS — C61 Malignant neoplasm of prostate: Secondary | ICD-10-CM | POA: Diagnosis not present

## 2018-05-09 DIAGNOSIS — Z1331 Encounter for screening for depression: Secondary | ICD-10-CM | POA: Diagnosis not present

## 2018-05-09 DIAGNOSIS — J40 Bronchitis, not specified as acute or chronic: Secondary | ICD-10-CM | POA: Diagnosis not present

## 2018-05-12 IMAGING — CR DG CHEST 2V
1 series · 2 of 2 positions shown · non-contrast
Comparison: Chest x-rays dated 03/18/2017, 03/03/2017 and
07/31/2015.

CLINICAL DATA: History of thoracotomy , pt voices no complaints
thoracotomy on 03/10/2017.

EXAM:
CHEST - 2 VIEW

[Series 1: dg chest 2 view · 0.14mm/px · 2 of 2 slices shown]
[im 1/2]
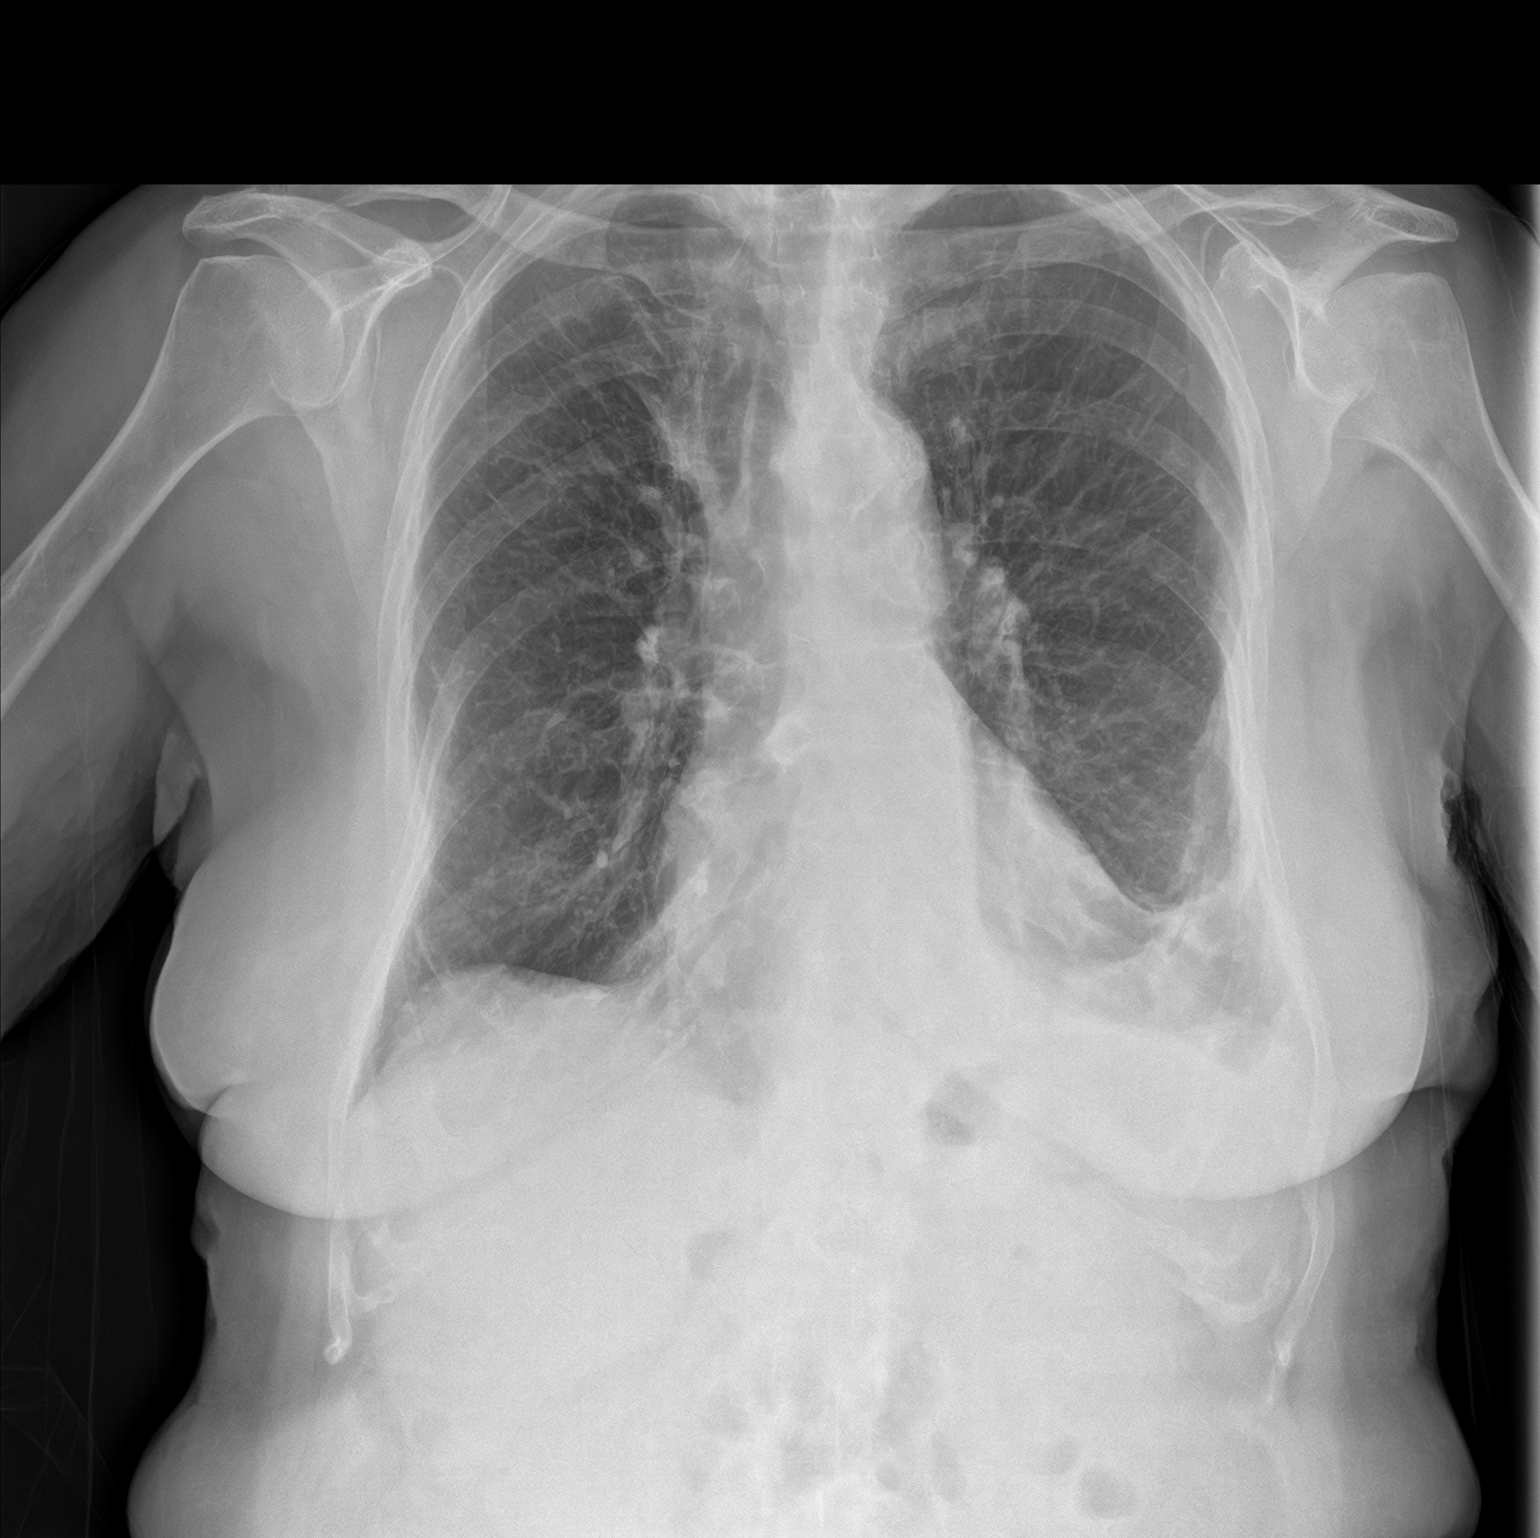
[im 2/2]
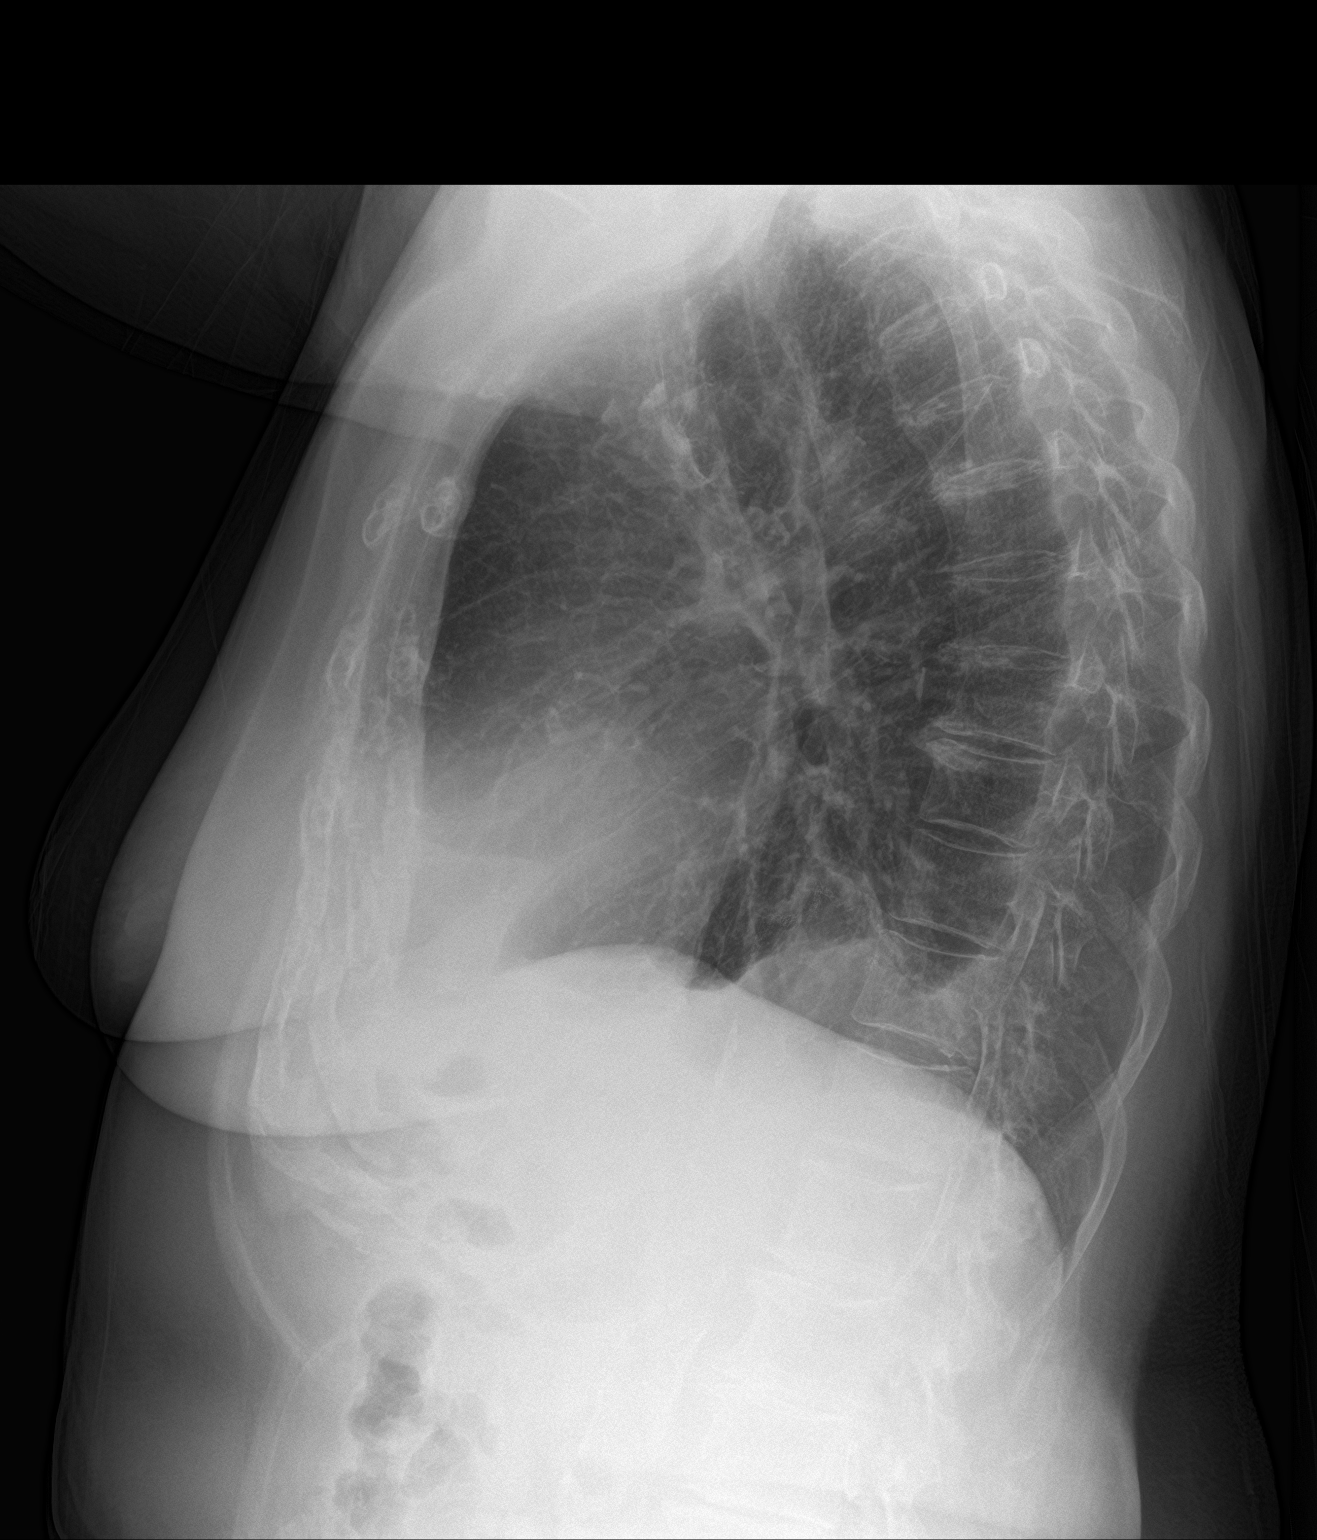

[2 of 2 positions shown; findings below may reference images not displayed]

FINDINGS: Heart size and mediastinal contours are stable. Atherosclerotic
changes noted at the aortic arch.

Persistent opacity at the left lung base, stable compared to most
recent chest x-ray of 03/18/2017, likely postsurgical change and/or
small pleural effusion. Stable chronic postsurgical changes within
the right upper lobe. Lungs otherwise clear. No pneumothorax seen.

Mild degenerative spurring within the thoracic and lumbar spine. No
acute or suspicious osseous finding.
IMPRESSION: 1. No acute findings.  No evidence of pneumonia or pulmonary edema.
2. Presumed postsurgical change at the left lung base related to
recent thoracotomy. Probable small associated pleural effusion.
3. Aortic atherosclerosis.

## 2018-07-12 DIAGNOSIS — L03311 Cellulitis of abdominal wall: Secondary | ICD-10-CM | POA: Diagnosis not present

## 2018-08-28 DIAGNOSIS — E782 Mixed hyperlipidemia: Secondary | ICD-10-CM | POA: Diagnosis not present

## 2018-09-04 DIAGNOSIS — I471 Supraventricular tachycardia: Secondary | ICD-10-CM | POA: Diagnosis not present

## 2018-09-04 DIAGNOSIS — Z0000 Encounter for general adult medical examination without abnormal findings: Secondary | ICD-10-CM | POA: Diagnosis not present

## 2018-09-04 DIAGNOSIS — I10 Essential (primary) hypertension: Secondary | ICD-10-CM | POA: Diagnosis not present

## 2018-09-04 DIAGNOSIS — C3432 Malignant neoplasm of lower lobe, left bronchus or lung: Secondary | ICD-10-CM | POA: Diagnosis not present

## 2018-09-04 DIAGNOSIS — C3411 Malignant neoplasm of upper lobe, right bronchus or lung: Secondary | ICD-10-CM | POA: Diagnosis not present

## 2018-09-04 DIAGNOSIS — E782 Mixed hyperlipidemia: Secondary | ICD-10-CM | POA: Diagnosis not present

## 2018-09-13 ENCOUNTER — Other Ambulatory Visit: Payer: Self-pay

## 2018-09-13 ENCOUNTER — Ambulatory Visit
Admission: RE | Admit: 2018-09-13 | Discharge: 2018-09-13 | Disposition: A | Discharge status: Home / Self Care | Payer: PPO | Source: Ambulatory Visit | Attending: Oncology | Admitting: Oncology

## 2018-09-13 DIAGNOSIS — C3411 Malignant neoplasm of upper lobe, right bronchus or lung: Secondary | ICD-10-CM | POA: Diagnosis not present

## 2018-09-13 DIAGNOSIS — Z85118 Personal history of other malignant neoplasm of bronchus and lung: Secondary | ICD-10-CM | POA: Insufficient documentation

## 2018-09-15 ENCOUNTER — Other Ambulatory Visit: Payer: Self-pay

## 2018-09-15 ENCOUNTER — Inpatient Hospital Stay: Payer: PPO | Attending: Oncology

## 2018-09-15 ENCOUNTER — Inpatient Hospital Stay (HOSPITAL_BASED_OUTPATIENT_CLINIC_OR_DEPARTMENT_OTHER): Payer: PPO | Admitting: Oncology

## 2018-09-15 ENCOUNTER — Encounter: Payer: Self-pay | Admitting: Oncology

## 2018-09-15 VITALS — BP 189/79 | HR 67 | Temp 97.0°F | Resp 20 | Ht 62.0 in | Wt 142.6 lb

## 2018-09-15 DIAGNOSIS — I471 Supraventricular tachycardia: Secondary | ICD-10-CM | POA: Insufficient documentation

## 2018-09-15 DIAGNOSIS — Z85118 Personal history of other malignant neoplasm of bronchus and lung: Secondary | ICD-10-CM

## 2018-09-15 DIAGNOSIS — D751 Secondary polycythemia: Secondary | ICD-10-CM

## 2018-09-15 DIAGNOSIS — I6529 Occlusion and stenosis of unspecified carotid artery: Secondary | ICD-10-CM | POA: Diagnosis not present

## 2018-09-15 DIAGNOSIS — I10 Essential (primary) hypertension: Secondary | ICD-10-CM | POA: Insufficient documentation

## 2018-09-15 DIAGNOSIS — M549 Dorsalgia, unspecified: Secondary | ICD-10-CM | POA: Insufficient documentation

## 2018-09-15 DIAGNOSIS — Z8669 Personal history of other diseases of the nervous system and sense organs: Secondary | ICD-10-CM | POA: Diagnosis not present

## 2018-09-15 DIAGNOSIS — K449 Diaphragmatic hernia without obstruction or gangrene: Secondary | ICD-10-CM | POA: Insufficient documentation

## 2018-09-15 DIAGNOSIS — J432 Centrilobular emphysema: Secondary | ICD-10-CM | POA: Insufficient documentation

## 2018-09-15 DIAGNOSIS — Z79899 Other long term (current) drug therapy: Secondary | ICD-10-CM | POA: Diagnosis not present

## 2018-09-15 DIAGNOSIS — E7800 Pure hypercholesterolemia, unspecified: Secondary | ICD-10-CM | POA: Diagnosis not present

## 2018-09-15 DIAGNOSIS — C3411 Malignant neoplasm of upper lobe, right bronchus or lung: Secondary | ICD-10-CM

## 2018-09-15 DIAGNOSIS — E785 Hyperlipidemia, unspecified: Secondary | ICD-10-CM | POA: Insufficient documentation

## 2018-09-15 DIAGNOSIS — Z86010 Personal history of colonic polyps: Secondary | ICD-10-CM | POA: Insufficient documentation

## 2018-09-15 DIAGNOSIS — K219 Gastro-esophageal reflux disease without esophagitis: Secondary | ICD-10-CM | POA: Diagnosis not present

## 2018-09-15 DIAGNOSIS — Z87891 Personal history of nicotine dependence: Secondary | ICD-10-CM | POA: Insufficient documentation

## 2018-09-15 DIAGNOSIS — C3432 Malignant neoplasm of lower lobe, left bronchus or lung: Secondary | ICD-10-CM

## 2018-09-15 LAB — CBC WITH DIFFERENTIAL/PLATELET
Abs Immature Granulocytes: 0.01 10*3/uL (ref 0.00–0.07)
Basophils Absolute: 0 10*3/uL (ref 0.0–0.1)
Basophils Relative: 1 %
Eosinophils Absolute: 0.2 10*3/uL (ref 0.0–0.5)
Eosinophils Relative: 4 %
HCT: 45.7 % (ref 36.0–46.0)
Hemoglobin: 15.2 g/dL — ABNORMAL HIGH (ref 12.0–15.0)
Immature Granulocytes: 0 %
Lymphocytes Relative: 37 %
Lymphs Abs: 2.4 10*3/uL (ref 0.7–4.0)
MCH: 32.6 pg (ref 26.0–34.0)
MCHC: 33.3 g/dL (ref 30.0–36.0)
MCV: 98.1 fL (ref 80.0–100.0)
Monocytes Absolute: 0.4 10*3/uL (ref 0.1–1.0)
Monocytes Relative: 7 %
Neutro Abs: 3.5 10*3/uL (ref 1.7–7.7)
Neutrophils Relative %: 51 %
Platelets: 163 10*3/uL (ref 150–400)
RBC: 4.66 MIL/uL (ref 3.87–5.11)
RDW: 12.2 % (ref 11.5–15.5)
WBC: 6.6 10*3/uL (ref 4.0–10.5)
nRBC: 0 % (ref 0.0–0.2)

## 2018-09-15 LAB — COMPREHENSIVE METABOLIC PANEL
ALT: 19 U/L (ref 0–44)
AST: 25 U/L (ref 15–41)
Albumin: 4.4 g/dL (ref 3.5–5.0)
Alkaline Phosphatase: 66 U/L (ref 38–126)
Anion gap: 9 (ref 5–15)
BUN: 19 mg/dL (ref 8–23)
CO2: 28 mmol/L (ref 22–32)
Calcium: 9.7 mg/dL (ref 8.9–10.3)
Chloride: 106 mmol/L (ref 98–111)
Creatinine, Ser: 0.82 mg/dL (ref 0.44–1.00)
GFR calc Af Amer: >60 mL/min (ref >60)
GFR calc non Af Amer: >60 mL/min (ref >60)
Glucose, Bld: 112 mg/dL — ABNORMAL HIGH (ref 70–99)
Potassium: 4.8 mmol/L (ref 3.5–5.1)
Sodium: 143 mmol/L (ref 135–145)
Total Bilirubin: 0.8 mg/dL (ref 0.3–1.2)
Total Protein: 7.6 g/dL (ref 6.5–8.1)

## 2018-09-15 LAB — VITAMIN B12: Vitamin B-12: 481 pg/mL (ref 180–914)

## 2018-09-15 NOTE — Progress Notes (Signed)
Heather [name] is a 83 y.o. 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  PERTINENT ONCOLOGY HISTORY Previously follows up with Dr.Corcoran. [place] care with me on 03/15/2018  PET scan on 02/13/2014 revealed a 1 cm right upper lobe hypermetabolic nodule (SUV 3.8) and a 6 mm right upper lobe nodule (no metabolic activity). There was no mediastianl adenopathy.  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 Pathology revealed a 1.1 cm high-grade adenocarcinoma with pleural invasion. Nodes were negative. Pathologic stage was T2aN1M0 (stage IB).  Chest CT on 02/10/2017 revealed clear interval progression of the posterior left lower lobe pulmonary nodule, now measuring 12 mm in long axis and having a distinctly lobular contour. Imaging features were very concerning for neoplasm.  There was no change in the 7 mm nodule posterior to the right hilum.  PET scan on 02/18/2017 revealed a 11 x 8 mm posterior left lower lobe pulmonary nodule that had mildly progressed from prior studies and demonstrated mild hypermetabolism (SUV 1.6).  The appearance was considered worrisome for an indolent primary bronchogenic neoplasm.  03/10/2017.  s/p wedge resection of a left lower lobe mass.   Pathology revealed an 8 mm invasive adenocarcinoma with solid and acinar patterns.  There was no visceral invasion.  There was no lymphovascular invasion.  All margins were negative.  Pathologic stage was T1aNx (stage IA).   Chest CT on 09/07/2017 revealed stable postsurgical change in the RIGHT upper lobe and LEFT lower lobe without evidence local recurrence.  There was a stable 8 mm nodule in the central RIGHT upper lobe.  There was upper lobe  centrilobular emphysema.  CEA has been followed: 4.7 on 10/01/2014, 4.5 on 07/31/2015, 3.9 on 05/03/2016, and 3.9 on 02/11/2017.  Bilateral mammogram on 07/22/2016 revealed no evidence of malignancy.   INTERVAL HISTORY Heather [name] is a 83 y.o. female who has above history reviewed by me today presents for follow up visit for management of history of lung cancer, 6 months assessment.  Problems and complaints are listed below: Patient reports doing well today.  Denies any new complaints.  Denies any fever, chills, unintentional weight loss, cough, shortness of breath. Energy level is at baseline. Chronic intermittent back pain.  She decided not to proceed with epidural steroid injections.   Past Medical History:  Diagnosis Date  . Brain aneurysm   . Cancer ([place]) lung   2016  . Carotid artery stenosis   . Carotid stenosis 06/19/2013   Overview:  Normal study, 1/16  . Colonic polyp   . GERD (gastroesophageal reflux disease)   . Hiatal hernia   . Hypercholesteremia   . Hyperlipidemia, mixed 06/09/2015  . Hypertension   . Lung nodule 06/21/2014  . Macrocytic 10/06/2014  . Medicare annual wellness visit, initial 07/08/2016   Overview:  6/18  . PAT (paroxysmal atrial tachycardia) ([place]) 06/19/2013  . Plantar fasciitis   . Tachycardia, paroxysmal ([place]) 03/31/14    Past Surgical History:  Procedure Laterality Date  . BREAST EXCISIONAL BIOPSY Right 40 yrs ago   neg  . BREAST LUMPECTOMY     benign  . CATARACT EXTRACTION W/ INTRAOCULAR LENS  IMPLANT, BILATERAL Bilateral   . CEREBRAL ANEURYSM REPAIR  2004  . CERVIX  LESION DESTRUCTION    . COLONOSCOPY WITH PROPOFOL N/A 03/31/2015   Procedure: COLONOSCOPY WITH PROPOFOL;  Surgeon: Manya Silvas, MD;  Location: [facility] ENDOSCOPY;  Service: Endoscopy;  Laterality: N/A;  . FLEXIBLE BRONCHOSCOPY N/A 03/10/2017   Procedure: FLEXIBLE BRONCHOSCOPY;  Surgeon: Nestor Lewandowsky, MD;  Location: [facility] ORS;  Service: Thoracic;  Laterality: N/A;  .  THORACOTOMY/LOBECTOMY Left 03/10/2017   Procedure: THORACOTOMY WITH LUNG WEDGE RESECTION POSSIBLE LOBECTOMY;  Surgeon: Nestor Lewandowsky, MD;  Location: [facility] ORS;  Service: Thoracic;  Laterality: Left;  . TUBAL LIGATION      Family History  Problem Relation Age of Onset  . Alcohol abuse Mother   . Heart attack Father   . Breast cancer Neg Hx     Social History:  reports that she quit smoking about 30 years ago. She quit after 35.00 years of use. She has never used smokeless tobacco. She reports current alcohol use. She reports that she does not use drugs.   Allergies:  Allergies  Allergen Reactions  . Ace Inhibitors Swelling  . Contrast Media [Iodinated Diagnostic Agents]   . Cortisone     Patient had facial swelling and itching from cortisone injection IM    Current Medications: Current Outpatient Medications  Medication Sig Dispense Refill  . ALPRAZolam (XANAX) 0.25 MG tablet Take 0.25 mg by mouth 2 (two) times daily as needed for anxiety.     Marland Kitchen amLODipine (NORVASC) 5 MG tablet Take by mouth.    Marland Kitchen atenolol (TENORMIN) 50 MG tablet Take 50 mg by mouth at bedtime.    . cetirizine (ZYRTEC) 10 MG tablet Take 10 mg by mouth daily.    . Cholecalciferol 1000 UNITS tablet Take 1,000 Units by mouth daily.    . fluticasone (FLONASE) 50 MCG/ACT nasal spray Place 1 spray into the nose daily as needed for allergies.     . hydrochlorothiazide (HYDRODIURIL) 25 MG tablet Take 25 mg by mouth daily as needed.     . meloxicam (MOBIC) 7.5 MG tablet Take 7.5 mg by mouth daily.    . simvastatin (ZOCOR) 20 MG tablet Take 20 mg by mouth daily.    Marland Kitchen amoxicillin (AMOXIL) 500 MG capsule     . Coenzyme Q10 (CO Q 10) 100 MG CAPS Take 1 capsule by mouth daily.    Marland Kitchen donepezil (ARICEPT) 5 MG tablet Take 5 mg by mouth at bedtime.     Marland Kitchen oxyCODONE-acetaminophen (PERCOCET/ROXICET) 5-325 MG tablet Take 1-2 tablets by mouth every 4 (four) hours as needed for moderate pain. (Patient not taking: Reported on 03/15/2018) 30  tablet 0   No current facility-administered medications for this visit.    Facility-Administered Medications Ordered in Other Visits  Medication Dose Route Frequency Provider Last Rate Last Dose  . 0.9 %  sodium chloride infusion   Intravenous Continuous Manya Silvas, MD       Review of Systems  Constitutional: Negative for appetite change, chills, fatigue and fever.  HENT:   Negative for hearing loss and voice change.   Eyes: Negative for eye problems.  Respiratory: Negative for chest tightness and cough.   Cardiovascular: Negative for chest pain.  Gastrointestinal: Negative for abdominal distention, abdominal pain and blood in stool.  Endocrine: Negative for hot flashes.  Genitourinary: Negative for difficulty urinating and frequency.   Musculoskeletal: Positive for back pain. Negative for arthralgias.       Chronic intermittent back pain  Skin: Negative for itching and rash.  Neurological: Negative for extremity weakness.  Hematological: Negative for adenopathy.  Psychiatric/Behavioral: Negative for confusion.    Performance status (ECOG): 0 - Asymptomatic  Vital Signs BP (!) 189/79 (BP Location: Right Arm, Patient Position: Sitting)   Pulse 67   Temp (!) 97 F (36.1 C) (Tympanic)   Resp 20   Ht _0  (1.575 m)   Wt 142 lb 9.6 oz (64.7 kg)   BMI 26.08 kg/m   Physical Exam  Constitutional: She is oriented to person, place, and time. No distress.  HENT:  Head: Normocephalic and atraumatic.  Mouth/Throat: No oropharyngeal exudate.  Eyes: Pupils are equal, round, and reactive to light. EOM are normal. No scleral icterus.  Neck: Normal range of motion. Neck supple.  Cardiovascular: Normal rate and regular rhythm.  No murmur heard. Pulmonary/Chest: Effort normal. No respiratory distress.  Decreased breath sound bilaterally  Abdominal: Soft. She exhibits no distension. There is no abdominal tenderness.  Musculoskeletal: Normal range of motion.        General: No  edema.  Neurological: She is alert and oriented to person, place, and time.  Skin: Skin is warm and dry. She is not diaphoretic. No erythema.  Psychiatric: Affect normal.    Imaging studies: 02/13/2014:  PET scan revealed a 1 cm right upper lobe hypermetabolic nodule (SUV 3.8) and a 6 mm right upper lobe nodule (no metabolic activity). There was no mediastianl adenopathy.  09/27/2014:  Chest CT revealed images consistent with right upper lobe wedge resection.  There was slight pleural parenchymal nodular thickening and mild distortion consistent with post operative scarring.   04/28/2015:  Chest CT revealed stable postoperative findings and scarring in the right lung.  There was slight nodularity along the wedge resection site.  There was a 7 x 6 mm right upper lobe nodule (unchanged from 02/05/2014).  10/28/2015:  Chest CT revealed no evidence of lung cancer recurrence following right upper lobe wedge resection.  There was stable 7 mm perihilar pulmonary nodule in the right upper lobe.   04/30/2016:  Chest CT revealed stable surgical changes involving the right upper lung from a prior wedge resection.  There was no findings for recurrent tumor, mediastinal/hilar adenopathy or pulmonary metastatic disease.  There was a stable 7 mm superior segment right lower lobe pulmonary nodule.  The left lower lobe 6 mm pulmonary nodule appeared slightly more solid and nodular when compared to prior examination. 08/02/2016:  Chest CT revealed stable surgical changes right lung and a stable 7 mm right retro hilar nodule.  The 7 mm posterior left lower lobe nodule was not substantially changed from 6 mm on the prior study. When comparing back to older exam of 10/28/2015, this was more clearly progressed over that timeframe. 02/10/2017:  Chest CT revealed clear interval progression of the posterior left lower lobe pulmonary nodule, now measuring 12 mm in long axis and having a distinctly lobular contour. Imaging  features were very concerning for neoplasm. Lesion position in the posterior lung base makes it susceptible to motion artifact on PET imaging, but at it's current size, PET imaging may well prove helpful to further evaluate.  There was no change in the 7 mm nodule posterior to the right hilum. 02/18/2017:  PET scan revealed a 11 x 8 mm posterior left lower lobe pulmonary nodule that had mildly progressed from prior studies and demonstrated mild hypermetabolism (SUV 1.6).  The appearance was considered worrisome for an indolent primary bronchogenic neoplasm.  Metastasis was considered unlikely. 03/10/2018 CT chest wo contrast Surgical changes of right  upper and left lower lobe wedge resection, without locally recurrent or metastatic disease. 2. Similar central right upper lobe 8 mm pulmonary nodule. 3. Aortic atherosclerosis (ICD10-I70.0), coronary artery atherosclerosis and emphysema (ICD10-J43.9). 09/13/2018 CT chest without contrast stable exam.  Postsurgical changes from right upper lobe and left lower lobe wedge resection without findings of local recurrence or metastatic changes. Unchanged 7 mm central right upper lobe lung nodule.  Aortic sclerosis and emphysema.  Laboratory results were reviewed by me CBC Latest Ref Rng & Units 09/15/2018 03/15/2018 09/13/2017  WBC 4.0 - 10.5 K/uL 6.6 7.0 5.0  Hemoglobin 12.0 - 15.0 g/dL 15.2(H) 14.4 14.3  Hematocrit 36.0 - 46.0 % 45.7 42.7 41.4  Platelets 150 - 400 K/uL 163 146(L) 142(L)   CMP Latest Ref Rng & Units 09/15/2018 03/15/2018 09/13/2017  Glucose 70 - 99 mg/dL 112(H) 101(H) 100(H)  BUN 8 - 23 mg/dL 19 25(H) 27(H)  Creatinine 0.44 - 1.00 mg/dL 0.82 0.80 0.81  Sodium 135 - 145 mmol/L 143 142 140  Potassium 3.5 - 5.1 mmol/L 4.8 4.4 4.0  Chloride 98 - 111 mmol/L 106 104 107  CO2 22 - 32 mmol/L _0 Calcium 8.9 - 10.3 mg/dL 9.7 9.2 9.3  Total Protein 6.5 - 8.1 g/dL 7.6 6.9 6.8  Total Bilirubin 0.3 - 1.2 mg/dL 0.8 0.8 0.8  Alkaline Phos 38 - 126  U/L 66 56 52  AST 15 - 41 U/L _1 ALT 0 - 44 U/L _2 Assessment:  Heather [name] is a 83 y.o. 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 Pathology revealed a 1.1 cm high-grade adenocarcinoma with pleural invasion. Nodes were negative. Pathologic stage was T2aN1M0 (stage IB).  She is s/p wedge resection of a left lower lobe mass on 03/10/2017.  Pathology revealed an 8 mm invasive adenocarcinoma with solid and acinar patterns.  There was no visceral invasion.  There was no lymphovascular invasion.  All margins were negative.  Pathologic stage was T1aNx (stage IA). 1. History of lung cancer   2. Primary cancer of right upper lobe of lung ([place])   3. Primary cancer of left lower lobe of lung ([place])   4. Erythrocytosis   Patient clinically doing well. CT chest without contrast on 09/13/2018 were independent reviewed by me and discussed with patient. No evidence of local recurrence or metastatic disease. Continue CT chest surveillance every 6 months. Chronic back pain, she follows up with Dr. Sharlet Salina.  She decides not to proceed with epidural steroid injections.  Erythrocytosis, hemoglobin 15.2, hematocrit 45.7.  Continue monitor.  Likely secondary erythrocytosis due to emphysema. Patient has stopped drinking alcohol.  Taking vitamin B12 supplementation.  Continue.  Thrombocytopenia resolved.   RTC after CT scan for MD assessment and labs (CBC with diff, CMP, B12, folate). Orders Placed This Encounter  Procedures  . CT Chest Wo Contrast    Standing Status:   Future    Standing Expiration Date:   09/15/2019    Order Specific Question:   ** REASON FOR EXAM (FREE TEXT)    Answer:   History of lung cancer    Order Specific Question:   Preferred imaging location?    Answer:   [facility]    Order Specific Question:   Radiology Contrast Protocol - do NOT remove file path    Answer:    [email or web address]  . Comprehensive metabolic panel    Standing  Status:   Future    Standing Expiration Date:   09/15/2019  . CBC with Differential/Platelet    Standing Status:   Future    Standing Expiration Date:   09/15/2019  . Vitamin B12    Standing Status:   Future    Standing Expiration Date:   05/16/2019   We spent sufficient time to discuss many aspect of care, questions were answered to patient's satisfaction. Total face to face encounter time for this patient visit was 25 min. >50% of the time was  spent in counseling and coordination of care.    Earlie Server, MD, PhD Hematology Oncology [facility] Pager- [phone] 09/15/2018

## 2018-11-03 ENCOUNTER — Other Ambulatory Visit: Payer: Self-pay | Admitting: Medical Oncology

## 2018-11-03 ENCOUNTER — Other Ambulatory Visit: Payer: Self-pay | Admitting: Internal Medicine

## 2018-11-03 DIAGNOSIS — Z1231 Encounter for screening mammogram for malignant neoplasm of breast: Secondary | ICD-10-CM

## 2018-11-28 ENCOUNTER — Ambulatory Visit
Admission: RE | Admit: 2018-11-28 | Discharge: 2018-11-28 | Disposition: A | Discharge status: Home / Self Care | Payer: PPO | Source: Ambulatory Visit | Attending: Internal Medicine | Admitting: Internal Medicine

## 2018-11-28 ENCOUNTER — Other Ambulatory Visit: Payer: Self-pay

## 2018-11-28 DIAGNOSIS — R05 Cough: Secondary | ICD-10-CM | POA: Diagnosis not present

## 2018-11-28 DIAGNOSIS — Z1231 Encounter for screening mammogram for malignant neoplasm of breast: Secondary | ICD-10-CM | POA: Diagnosis not present

## 2018-11-28 DIAGNOSIS — J431 Panlobular emphysema: Secondary | ICD-10-CM | POA: Insufficient documentation

## 2018-12-22 ENCOUNTER — Ambulatory Visit
Admission: EM | Admit: 2018-12-22 | Discharge: 2018-12-22 | Disposition: A | Discharge status: Home / Self Care | Payer: PPO | Attending: Family Medicine | Admitting: Family Medicine

## 2018-12-22 ENCOUNTER — Other Ambulatory Visit: Payer: Self-pay

## 2018-12-22 ENCOUNTER — Encounter: Payer: Self-pay | Admitting: Emergency Medicine

## 2018-12-22 DIAGNOSIS — Z20822 Contact with and (suspected) exposure to covid-19: Secondary | ICD-10-CM

## 2018-12-22 DIAGNOSIS — Z20828 Contact with and (suspected) exposure to other viral communicable diseases: Secondary | ICD-10-CM | POA: Diagnosis not present

## 2018-12-22 NOTE — ED Provider Notes (Signed)
[facility]    CSN: [id number] Arrival date & time: 12/22/18  1118      History   Chief Complaint Chief Complaint  Patient presents with   covid testing    HPI Heather [name] is a 83 y.o. female.   Subjective:   Heather [name] is a 83 y.o. female who presents for COVID testing. She currently denies any symptoms. She has no known COVID exposure; however, her husband has been sick for the past 5 days. High risk factors for COVID complications includes age greater than 22 years of age and co-morbid illness.  The following portions of the patient's history were reviewed and updated as appropriate: allergies, current medications, past family history, past medical history, past social history, past surgical history and problem list.        Past Medical History:  Diagnosis Date   Brain aneurysm    Cancer ([place]) lung   2016   Carotid artery stenosis    Carotid stenosis 06/19/2013   Overview:  Normal study, 1/16   Colonic polyp    GERD (gastroesophageal reflux disease)    Hiatal hernia    Hypercholesteremia    Hyperlipidemia, mixed 06/09/2015   Hypertension    Lung nodule 06/21/2014   Macrocytic 10/06/2014   Medicare annual wellness visit, initial 07/08/2016   Overview:  6/18   PAT (paroxysmal atrial tachycardia) ([place]) 06/19/2013   Plantar fasciitis    Tachycardia, paroxysmal ([place]) 03/31/14    Patient Active Problem List   Diagnosis Date Noted   Malignant neoplasm of lower lobe of left lung ([place]) 03/10/2017   Medicare annual wellness visit, initial 07/08/2016   DDD (degenerative disc disease), lumbar 02/10/2016   Hyperlipidemia, mixed 06/09/2015   Macrocytic 10/06/2014   Lung nodule 06/21/2014   Primary cancer of right upper lobe of lung ([place]) 03/13/2014   Brain aneurysm 06/19/2013   Carotid stenosis 06/19/2013   GERD (gastroesophageal reflux disease) 06/19/2013   PAT (paroxysmal atrial tachycardia) ([place]) 06/19/2013    Past  Surgical History:  Procedure Laterality Date   BREAST EXCISIONAL BIOPSY Right 40 yrs ago   neg   BREAST LUMPECTOMY     benign   CATARACT EXTRACTION W/ INTRAOCULAR LENS  IMPLANT, BILATERAL Bilateral    CEREBRAL ANEURYSM REPAIR  2004   CERVIX LESION DESTRUCTION     COLONOSCOPY WITH PROPOFOL N/A 03/31/2015   Procedure: COLONOSCOPY WITH PROPOFOL;  Surgeon: Manya Silvas, MD;  Location: [facility] ENDOSCOPY;  Service: Endoscopy;  Laterality: N/A;   FLEXIBLE BRONCHOSCOPY N/A 03/10/2017   Procedure: FLEXIBLE BRONCHOSCOPY;  Surgeon: Nestor Lewandowsky, MD;  Location: [facility] ORS;  Service: Thoracic;  Laterality: N/A;   THORACOTOMY/LOBECTOMY Left 03/10/2017   Procedure: THORACOTOMY WITH LUNG WEDGE RESECTION POSSIBLE LOBECTOMY;  Surgeon: Nestor Lewandowsky, MD;  Location: [facility] ORS;  Service: Thoracic;  Laterality: Left;   TUBAL LIGATION      OB History   No obstetric history on file.      Home Medications    Prior to Admission medications   Medication Sig Start Date End Date Taking? Authorizing Provider  amLODipine (NORVASC) 5 MG tablet Take by mouth. 06/10/17 12/22/18 Yes [provider]  atenolol (TENORMIN) 50 MG tablet Take 50 mg by mouth at bedtime.   Yes [provider]  cetirizine (ZYRTEC) 10 MG tablet Take 10 mg by mouth daily.   Yes [provider]  Cholecalciferol 1000 UNITS tablet Take 1,000 Units by mouth daily.   Yes [provider]  Coenzyme Q10 (  CO Q 10) 100 MG CAPS Take 1 capsule by mouth daily.   Yes [provider]  fluticasone (FLONASE) 50 MCG/ACT nasal spray Place 1 spray into the nose daily as needed for allergies.  04/05/15  Yes [provider]  meloxicam (MOBIC) 7.5 MG tablet Take 7.5 mg by mouth daily. 02/17/18 02/17/19 Yes [provider]  simvastatin (ZOCOR) 20 MG tablet Take 20 mg by mouth daily.   Yes [provider]  ALPRAZolam (XANAX) 0.25 MG tablet Take 0.25 mg by mouth 2 (two) times daily as needed for  anxiety.  06/12/15   [provider]  amoxicillin (AMOXIL) 500 MG capsule  09/05/17   [provider]  donepezil (ARICEPT) 5 MG tablet Take 5 mg by mouth at bedtime.  07/27/16   [provider]  hydrochlorothiazide (HYDRODIURIL) 25 MG tablet Take 25 mg by mouth daily as needed.     [provider]  montelukast (SINGULAIR) 10 MG tablet Take 10 mg by mouth daily. 11/28/18   [provider]  oxyCODONE-acetaminophen (PERCOCET/ROXICET) 5-325 MG tablet Take 1-2 tablets by mouth every 4 (four) hours as needed for moderate pain. Patient not taking: Reported on 03/15/2018 03/15/17   Nestor Lewandowsky, MD    Family History Family History  Problem Relation Age of Onset   Alcohol abuse Mother    Heart attack Father    Breast cancer Neg Hx     Social History Social History   Tobacco Use   Smoking status: Former Smoker    Years: 35.00    Quit date: 06/10/1988    Years since quitting: 30.5   Smokeless tobacco: Never Used  Substance Use Topics   Alcohol use: Yes    Alcohol/week: 0.0 standard drinks    Comment: glass of wine daily   Drug use: No     Allergies   Ace inhibitors, Contrast media [iodinated diagnostic agents], and Cortisone   Review of Systems Review of Systems  Constitutional: Negative for fever.  Respiratory: Negative for cough and shortness of breath.   Gastrointestinal: Negative for nausea and vomiting.  Musculoskeletal: Negative for myalgias.  Neurological: Negative for dizziness and headaches.  All other systems reviewed and are negative.    Physical Exam Triage Vital Signs ED Triage Vitals  Enc Vitals Group     BP 12/22/18 1259 (!) 162/75     Pulse Rate 12/22/18 1259 61     Resp 12/22/18 1259 18     Temp 12/22/18 1259 98 F (36.7 C)     Temp Source 12/22/18 1259 Oral     SpO2 12/22/18 1259 96 %     Weight 12/22/18 1255 145 lb (65.8 kg)     Height 12/22/18 1255 _0  (1.575 m)     Head Circumference --      Peak  Flow --      Pain Score 12/22/18 1255 0     Pain Loc --      Pain Edu? --      Excl. in [place]? --    No data found.  Updated Vital Signs BP (!) 162/75 (BP Location: Left Arm)    Pulse 61    Temp 98 F (36.7 C) (Oral)    Resp 18    Ht _1  (1.575 m)    Wt 145 lb (65.8 kg)    SpO2 96%    BMI 26.52 kg/m   Visual Acuity Right Eye Distance:   Left Eye Distance:   Bilateral Distance:  Right Eye Near:   Left Eye Near:    Bilateral Near:     Physical Exam Vitals signs reviewed.  Constitutional:      Appearance: Normal appearance.  HENT:     Head: Normocephalic.  Neck:     Musculoskeletal: Normal range of motion and neck supple.  Cardiovascular:     Rate and Rhythm: Normal rate and regular rhythm.  Pulmonary:     Effort: Pulmonary effort is normal.     Breath sounds: Normal breath sounds.  Musculoskeletal: Normal range of motion.  Skin:    General: Skin is warm and dry.  Neurological:     General: No focal deficit present.     Mental Status: She is alert and oriented to person, place, and time.  Psychiatric:        Mood and Affect: Mood normal.        Behavior: Behavior normal.      UC Treatments / Results  Labs (all labs ordered are listed, but only abnormal results are displayed) Labs Reviewed  NOVEL CORONAVIRUS, NAA (HOSP ORDER, SEND-OUT TO REF LAB; TAT 18-24 HRS)    EKG   Radiology No results found.  Procedures Procedures (including critical care time)  Medications Ordered in UC Medications - No data to display  Initial Impression / Assessment and Plan / UC Course  I have reviewed the triage vital signs and the nursing notes.  Pertinent labs & imaging results that were available during my care of the patient were reviewed by me and considered in my medical decision making (see chart for details).     83 y.o. female who presents for COVID testing. She currently denies any symptoms. She has no known COVID exposure; however, her husband has been sick  for the past 5 days. High risk factors for COVID complications includes age greater than 69 years of age and co-morbid illness.  Patient is afebrile.  Vital signs stable.  Nontoxic-appearing.  Physical exam unremarkable. Supportive care. Rest & fluids. Monitor closely for symptoms. Isolation per CDC guidelines. COVID 19 test pending. Educational material distributed and questions answered.  Today's evaluation has revealed no signs of a dangerous process. Discussed diagnosis with patient and/or guardian. Patient and/or guardian aware of their diagnosis, possible red flag symptoms to watch out for and need for close follow up. Patient and/or guardian understands verbal and written discharge instructions. Patient and/or guardian comfortable with plan and disposition.  Patient and/or guardian has a clear mental status at this time, good insight into illness (after discussion and teaching) and has clear judgment to make decisions regarding their care  This care was provided during an unprecedented National Emergency due to the Novel Coronavirus (COVID-19) pandemic. COVID-19 infections and transmission risks place heavy strains on healthcare resources.  As this pandemic evolves, our facility, providers, and staff strive to respond fluidly, to remain operational, and to provide care relative to available resources and information. Outcomes are unpredictable and treatments are without well-defined guidelines. Further, the impact of COVID-19 on all aspects of urgent care, including the impact to patients seeking care for reasons other than COVID-19, is unavoidable during this national emergency. At this time of the global pandemic, management of patients has significantly changed, even for non-COVID positive patients given high local and regional COVID volumes at this time requiring high healthcare system and resource utilization. The standard of care for management of both COVID suspected and non-COVID suspected  patients continues to change rapidly at the local, regional, national, and  global levels. This patient was worked up and treated to the best available but ever changing evidence and resources available at this current time.   Documentation was completed with the aid of voice recognition software. Transcription may contain typographical errors. Final Clinical Impressions(s) / UC Diagnoses   Final diagnoses:  Encounter for screening laboratory testing for COVID-19 virus in asymptomatic patient     Discharge Instructions     ou may take tylenol or ibuprofen as needed for fevers/headache/body aches.   Drink plenty of fluids.   Stay in home isolation until you receive results of your COVID test.   You will only be notified for positive results.  Call on Sunday or Monday to get your results.    Please follow CDC guidelines that are attached.   You may discontinue home isolation when there has been at least 10 days since symptoms onset AND 3 days fever free without antipyretics (Tylenol or Ibuprofen) AND an overall improvement in your symptoms.   Go to the ED immediately if you get worse or have any other symptoms.   Feel better soon!  Aldona Bar, FNP-C     ED Prescriptions    None     PDMP not reviewed this encounter.   Enrique Sack, [place] 12/22/18 1446

## 2018-12-22 NOTE — ED Triage Notes (Signed)
Pt present to [facility] urgent care for COVID testing. She states that she is here because her husband is having symptoms and wants to be tested.

## 2018-12-22 NOTE — Discharge Instructions (Signed)
ou may take tylenol or ibuprofen as needed for fevers/headache/body aches.   Drink plenty of fluids.   Stay in home isolation until you receive results of your COVID test.   You will only be notified for positive results.  Call on Sunday or Monday to get your results.    Please follow CDC guidelines that are attached.   You may discontinue home isolation when there has been at least 10 days since symptoms onset AND 3 days fever free without antipyretics (Tylenol or Ibuprofen) AND an overall improvement in your symptoms.   Go to the ED immediately if you get worse or have any other symptoms.   Feel better soon!  Aldona Bar, FNP-C

## 2018-12-23 LAB — NOVEL CORONAVIRUS, NAA (HOSP ORDER, SEND-OUT TO REF LAB; TAT 18-24 HRS): SARS-CoV-2, NAA: NOT DETECTED

## 2019-02-28 DIAGNOSIS — I471 Supraventricular tachycardia: Secondary | ICD-10-CM | POA: Diagnosis not present

## 2019-02-28 DIAGNOSIS — E782 Mixed hyperlipidemia: Secondary | ICD-10-CM | POA: Diagnosis not present

## 2019-03-06 DIAGNOSIS — I471 Supraventricular tachycardia: Secondary | ICD-10-CM | POA: Diagnosis not present

## 2019-03-06 DIAGNOSIS — J431 Panlobular emphysema: Secondary | ICD-10-CM | POA: Diagnosis not present

## 2019-03-06 DIAGNOSIS — C3432 Malignant neoplasm of lower lobe, left bronchus or lung: Secondary | ICD-10-CM | POA: Diagnosis not present

## 2019-03-06 DIAGNOSIS — C3411 Malignant neoplasm of upper lobe, right bronchus or lung: Secondary | ICD-10-CM | POA: Diagnosis not present

## 2019-03-06 DIAGNOSIS — E782 Mixed hyperlipidemia: Secondary | ICD-10-CM | POA: Diagnosis not present

## 2019-03-19 ENCOUNTER — Ambulatory Visit
Admission: RE | Admit: 2019-03-19 | Discharge: 2019-03-19 | Disposition: A | Discharge status: Home / Self Care | Payer: PPO | Source: Ambulatory Visit | Attending: Oncology | Admitting: Oncology

## 2019-03-19 ENCOUNTER — Other Ambulatory Visit: Payer: Self-pay

## 2019-03-19 DIAGNOSIS — C3432 Malignant neoplasm of lower lobe, left bronchus or lung: Secondary | ICD-10-CM | POA: Diagnosis not present

## 2019-03-19 DIAGNOSIS — J439 Emphysema, unspecified: Secondary | ICD-10-CM | POA: Diagnosis not present

## 2019-03-19 DIAGNOSIS — Z85118 Personal history of other malignant neoplasm of bronchus and lung: Secondary | ICD-10-CM | POA: Diagnosis not present

## 2019-03-21 ENCOUNTER — Encounter: Payer: Self-pay | Admitting: Oncology

## 2019-03-21 ENCOUNTER — Other Ambulatory Visit: Payer: Self-pay

## 2019-03-21 ENCOUNTER — Inpatient Hospital Stay: Payer: PPO | Attending: Oncology

## 2019-03-21 ENCOUNTER — Inpatient Hospital Stay (HOSPITAL_BASED_OUTPATIENT_CLINIC_OR_DEPARTMENT_OTHER): Payer: PPO | Admitting: Oncology

## 2019-03-21 VITALS — BP 131/79 | HR 60 | Temp 97.8°F | Resp 16 | Wt 145.5 lb

## 2019-03-21 DIAGNOSIS — C3432 Malignant neoplasm of lower lobe, left bronchus or lung: Secondary | ICD-10-CM | POA: Insufficient documentation

## 2019-03-21 DIAGNOSIS — Z87891 Personal history of nicotine dependence: Secondary | ICD-10-CM | POA: Insufficient documentation

## 2019-03-21 DIAGNOSIS — Z85118 Personal history of other malignant neoplasm of bronchus and lung: Secondary | ICD-10-CM

## 2019-03-21 DIAGNOSIS — J432 Centrilobular emphysema: Secondary | ICD-10-CM

## 2019-03-21 DIAGNOSIS — K449 Diaphragmatic hernia without obstruction or gangrene: Secondary | ICD-10-CM | POA: Diagnosis not present

## 2019-03-21 DIAGNOSIS — Z79899 Other long term (current) drug therapy: Secondary | ICD-10-CM | POA: Insufficient documentation

## 2019-03-21 DIAGNOSIS — I2510 Atherosclerotic heart disease of native coronary artery without angina pectoris: Secondary | ICD-10-CM | POA: Insufficient documentation

## 2019-03-21 DIAGNOSIS — E785 Hyperlipidemia, unspecified: Secondary | ICD-10-CM | POA: Insufficient documentation

## 2019-03-21 DIAGNOSIS — C3411 Malignant neoplasm of upper lobe, right bronchus or lung: Secondary | ICD-10-CM | POA: Diagnosis not present

## 2019-03-21 DIAGNOSIS — Z7901 Long term (current) use of anticoagulants: Secondary | ICD-10-CM | POA: Diagnosis not present

## 2019-03-21 DIAGNOSIS — I10 Essential (primary) hypertension: Secondary | ICD-10-CM | POA: Diagnosis not present

## 2019-03-21 DIAGNOSIS — Z86010 Personal history of colonic polyps: Secondary | ICD-10-CM | POA: Diagnosis not present

## 2019-03-21 DIAGNOSIS — K219 Gastro-esophageal reflux disease without esophagitis: Secondary | ICD-10-CM | POA: Insufficient documentation

## 2019-03-21 LAB — COMPREHENSIVE METABOLIC PANEL
ALT: 18 U/L (ref 0–44)
AST: 24 U/L (ref 15–41)
Albumin: 4.2 g/dL (ref 3.5–5.0)
Alkaline Phosphatase: 70 U/L (ref 38–126)
Anion gap: 9 (ref 5–15)
BUN: 21 mg/dL (ref 8–23)
CO2: 29 mmol/L (ref 22–32)
Calcium: 9.6 mg/dL (ref 8.9–10.3)
Chloride: 105 mmol/L (ref 98–111)
Creatinine, Ser: 0.75 mg/dL (ref 0.44–1.00)
GFR calc Af Amer: >60 mL/min (ref >60)
GFR calc non Af Amer: >60 mL/min (ref >60)
Glucose, Bld: 108 mg/dL — ABNORMAL HIGH (ref 70–99)
Potassium: 4.7 mmol/L (ref 3.5–5.1)
Sodium: 143 mmol/L (ref 135–145)
Total Bilirubin: 0.6 mg/dL (ref 0.3–1.2)
Total Protein: 6.9 g/dL (ref 6.5–8.1)

## 2019-03-21 LAB — CBC WITH DIFFERENTIAL/PLATELET
Abs Immature Granulocytes: 0.01 10*3/uL (ref 0.00–0.07)
Basophils Absolute: 0 10*3/uL (ref 0.0–0.1)
Basophils Relative: 0 %
Eosinophils Absolute: 0.2 10*3/uL (ref 0.0–0.5)
Eosinophils Relative: 2 %
HCT: 44.8 % (ref 36.0–46.0)
Hemoglobin: 14.4 g/dL (ref 12.0–15.0)
Immature Granulocytes: 0 %
Lymphocytes Relative: 29 %
Lymphs Abs: 2 10*3/uL (ref 0.7–4.0)
MCH: 31.5 pg (ref 26.0–34.0)
MCHC: 32.1 g/dL (ref 30.0–36.0)
MCV: 98 fL (ref 80.0–100.0)
Monocytes Absolute: 0.5 10*3/uL (ref 0.1–1.0)
Monocytes Relative: 8 %
Neutro Abs: 4.2 10*3/uL (ref 1.7–7.7)
Neutrophils Relative %: 61 %
Platelets: 199 10*3/uL (ref 150–400)
RBC: 4.57 MIL/uL (ref 3.87–5.11)
RDW: 12.3 % (ref 11.5–15.5)
WBC: 6.9 10*3/uL (ref 4.0–10.5)
nRBC: 0 % (ref 0.0–0.2)

## 2019-03-21 NOTE — Progress Notes (Signed)
[facility] day:  03/21/2019    Chief Complaint: Heather [name] is a 84 y.o. 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  PERTINENT ONCOLOGY HISTORY Previously follows up with Dr.Corcoran. [place] care with me on 03/15/2018  PET scan on 02/13/2014 revealed a 1 cm right upper lobe hypermetabolic nodule (SUV 3.8) and a 6 mm right upper lobe nodule (no metabolic activity). There was no mediastianl adenopathy.  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 Pathology revealed a 1.1 cm high-grade adenocarcinoma with pleural invasion. Nodes were negative. Pathologic stage was T2aN1M0 (stage IB).  Chest CT on 02/10/2017 revealed clear interval progression of the posterior left lower lobe pulmonary nodule, now measuring 12 mm in long axis and having a distinctly lobular contour. Imaging features were very concerning for neoplasm.  There was no change in the 7 mm nodule posterior to the right hilum.  PET scan on 02/18/2017 revealed a 11 x 8 mm posterior left lower lobe pulmonary nodule that had mildly progressed from prior studies and demonstrated mild hypermetabolism (SUV 1.6).  The appearance was considered worrisome for an indolent primary bronchogenic neoplasm.  03/10/2017.  s/p wedge resection of a left lower lobe mass.   Pathology revealed an 8 mm invasive adenocarcinoma with solid and acinar patterns.  There was no visceral invasion.  There was no lymphovascular invasion.  All margins were negative.  Pathologic stage was T1aNx (stage IA).   Chest CT on 09/07/2017 revealed stable postsurgical change in the RIGHT upper lobe and LEFT lower lobe without evidence local recurrence.  There was a stable 8 mm nodule in the central RIGHT upper lobe.  There was upper lobe  centrilobular emphysema.  CEA has been followed: 4.7 on 10/01/2014, 4.5 on 07/31/2015, 3.9 on 05/03/2016, and 3.9 on 02/11/2017.  Bilateral mammogram on 07/22/2016 revealed no evidence of malignancy.   INTERVAL HISTORY Heather [name] is a 84 y.o. female who has above history reviewed by me today presents for follow up visit for management of history of lung cancer, 6 months assessment.  Problems and complaints are listed below: Patient reports feeling well at baseline today.   She was seen by primary care doctor Dr. [name] Heck on 03/06/2019.  Notes were reviewed. She has chronic emphysema symptoms with cough and shortness of breath with exertion.  Symptoms are stable.  She uses inhalers and Singulair. She has no new complaints today.  Doing well.  Patient has had interval CT scan done and present to discuss results.    Past Medical History:  Diagnosis Date  . Brain aneurysm   . Cancer ([place]) lung   2016  . Carotid artery stenosis   . Carotid stenosis 06/19/2013   Overview:  Normal study, 1/16  . Colonic polyp   . GERD (gastroesophageal reflux disease)   . Hiatal hernia   . Hypercholesteremia   . Hyperlipidemia, mixed 06/09/2015  . Hypertension   . Lung nodule 06/21/2014  . Macrocytic 10/06/2014  . Medicare annual wellness visit, initial 07/08/2016   Overview:  6/18  . PAT (paroxysmal atrial tachycardia) ([place]) 06/19/2013  . Plantar fasciitis   . Tachycardia, paroxysmal ([place]) 03/31/14    Past Surgical History:  Procedure Laterality Date  . BREAST EXCISIONAL BIOPSY Right 40 yrs ago   neg  .  BREAST LUMPECTOMY     benign  . CATARACT EXTRACTION W/ INTRAOCULAR LENS  IMPLANT, BILATERAL Bilateral   . CEREBRAL ANEURYSM REPAIR  2004  . CERVIX LESION DESTRUCTION    . COLONOSCOPY WITH PROPOFOL N/A 03/31/2015   Procedure: COLONOSCOPY WITH PROPOFOL;  Surgeon: Manya Silvas, MD;  Location: [facility] ENDOSCOPY;  Service: Endoscopy;  Laterality: N/A;  . FLEXIBLE BRONCHOSCOPY N/A 03/10/2017   Procedure:  FLEXIBLE BRONCHOSCOPY;  Surgeon: Nestor Lewandowsky, MD;  Location: [facility] ORS;  Service: Thoracic;  Laterality: N/A;  . THORACOTOMY/LOBECTOMY Left 03/10/2017   Procedure: THORACOTOMY WITH LUNG WEDGE RESECTION POSSIBLE LOBECTOMY;  Surgeon: Nestor Lewandowsky, MD;  Location: [facility] ORS;  Service: Thoracic;  Laterality: Left;  . TUBAL LIGATION      Family History  Problem Relation Age of Onset  . Alcohol abuse Mother   . Heart attack Father   . Breast cancer Neg Hx     Social History:  reports that she quit smoking about 30 years ago. She quit after 35.00 years of use. She has never used smokeless tobacco. She reports current alcohol use. She reports that she does not use drugs.   Allergies:  Allergies  Allergen Reactions  . Ace Inhibitors Swelling  . Contrast Media [Iodinated Diagnostic Agents]   . Cortisone     Patient had facial swelling and itching from cortisone injection IM    Current Medications: Current Outpatient Medications  Medication Sig Dispense Refill  . ALPRAZolam (XANAX) 0.25 MG tablet Take 0.25 mg by mouth 2 (two) times daily as needed for anxiety.     Marland Kitchen amLODipine (NORVASC) 5 MG tablet Take by mouth.    Marland Kitchen amoxicillin (AMOXIL) 500 MG capsule     . atenolol (TENORMIN) 50 MG tablet Take 50 mg by mouth at bedtime.    . Cholecalciferol 1000 UNITS tablet Take 1,000 Units by mouth daily.    . Coenzyme Q10 (CO Q 10) 100 MG CAPS Take 1 capsule by mouth daily.    Marland Kitchen donepezil (ARICEPT) 5 MG tablet Take 5 mg by mouth at bedtime.     . fluticasone (FLONASE) 50 MCG/ACT nasal spray Place 1 spray into the nose daily as needed for allergies.     . Fluticasone-Salmeterol (ADVAIR) 100-50 MCG/DOSE AEPB Inhale into the lungs.    . hydrochlorothiazide (HYDRODIURIL) 25 MG tablet Take 25 mg by mouth daily as needed.     . montelukast (SINGULAIR) 10 MG tablet Take 10 mg by mouth daily.    . simvastatin (ZOCOR) 20 MG tablet Take 20 mg by mouth daily.    . cetirizine (ZYRTEC) 10 MG tablet Take 10 mg by  mouth daily.    Marland Kitchen oxyCODONE-acetaminophen (PERCOCET/ROXICET) 5-325 MG tablet Take 1-2 tablets by mouth every 4 (four) hours as needed for moderate pain. (Patient not taking: Reported on 03/15/2018) 30 tablet 0   No current facility-administered medications for this visit.   Facility-Administered Medications Ordered in Other Visits  Medication Dose Route Frequency Provider Last Rate Last Admin  . 0.9 %  sodium chloride infusion   Intravenous Continuous Manya Silvas, MD       Review of Systems  Constitutional: Negative for appetite change, chills, fatigue and fever.  HENT:   Negative for hearing loss and voice change.   Eyes: Negative for eye problems.  Respiratory: Negative for chest tightness.   Cardiovascular: Negative for chest pain.  Gastrointestinal: Negative for abdominal distention, abdominal pain and blood in stool.  Endocrine: Negative for hot flashes.  Genitourinary: Negative  for difficulty urinating and frequency.   Musculoskeletal: Positive for back pain. Negative for arthralgias.       Chronic intermittent back pain  Skin: Negative for itching and rash.  Neurological: Negative for extremity weakness.  Hematological: Negative for adenopathy.  Psychiatric/Behavioral: Negative for confusion.    Performance status (ECOG): 0 - Asymptomatic  Vital Signs BP 131/79   Pulse 60   Temp 97.8 F (36.6 C)   Resp 16   Wt 145 lb 8 oz (66 kg)   BMI 26.61 kg/m   Physical Exam  Constitutional: She is oriented to person, place, and time. No distress.  HENT:  Head: Normocephalic and atraumatic.  Mouth/Throat: No oropharyngeal exudate.  Eyes: Pupils are equal, round, and reactive to light. EOM are normal. No scleral icterus.  Cardiovascular: Normal rate and regular rhythm.  No murmur heard. Pulmonary/Chest: Effort normal. No respiratory distress.  Decreased breath sound bilaterally  Abdominal: Soft. She exhibits no distension. There is no abdominal tenderness.   Musculoskeletal:        General: No edema. Normal range of motion.     Cervical back: Normal range of motion and neck supple.  Neurological: She is alert and oriented to person, place, and time.  Skin: Skin is warm and dry. She is not diaphoretic. No erythema.  Psychiatric: Affect normal.    RADIOGRAPHIC STUDIES: I have personally reviewed the radiological images as listed and agreed with the findings in the report. CT Chest Wo Contrast  Result Date: 03/19/2019 CLINICAL DATA:  Right upper lobe lung adenocarcinoma status post wedge resection in 2016. Left lower lobe lung cancer status post wedge resection in 2019. Former smoker. Patient presents for routine restaging. EXAM: CT CHEST WITHOUT CONTRAST TECHNIQUE: Multidetector CT imaging of the chest was performed following the standard protocol without IV contrast. COMPARISON:  09/13/2018 chest CT. FINDINGS: Cardiovascular: Normal heart size. No significant pericardial effusion/thickening. Left anterior descending and left circumflex coronary atherosclerosis. Atherosclerotic nonaneurysmal thoracic aorta. Normal caliber pulmonary arteries. Mediastinum/Nodes: No discrete thyroid nodules. Unremarkable esophagus. No pathologically enlarged axillary, mediastinal or hilar lymph nodes, noting limited sensitivity for the detection of hilar adenopathy on this noncontrast study. Lungs/Pleura: No pneumothorax. No pleural effusion. Moderate centrilobular emphysema. Status post anterior right upper lobe and posterior left lower lobe wedge resections with associated stable curvilinear parenchymal banding at the wedge resection sites compatible with postsurgical scarring. Nodular subpleural 8 mm opacity in the basilar left lower lobe (series 3/image 113) is stable multiple prior chest CT studies and considered benign. Solid central right upper lobe 7 mm (series 3/image 51) and peripheral right middle lobe 3 mm (series 3/image 85) solid pulmonary nodules are both stable  on multiple prior chest CT studies, considered benign. No acute consolidative airspace disease, lung masses or new significant pulmonary nodules. Upper abdomen: Simple left liver lobe cysts, largest 1.9 cm. Subcentimeter hypodense scattered right liver lesions are too small to characterize and are unchanged, considered benign. Chronic moderate dilatation of the visualized left renal collecting system, unchanged. Musculoskeletal: No aggressive appearing focal osseous lesions. Moderate thoracic spondylosis. IMPRESSION: 1. No evidence of local tumor recurrence status post right upper and left lower lobe wedge resections. 2. No evidence of metastatic disease in the chest. 3. Two-vessel coronary atherosclerosis. 4. Chronic moderate dilatation of the visualized left renal collecting system. 5. Aortic Atherosclerosis (ICD10-I70.0) and Emphysema (ICD10-J43.9). Electronically Signed   By: Ilona Sorrel M.D.   On: 03/19/2019 10:50    Laboratory results were reviewed by me CBC Latest Ref  Rng & Units 03/21/2019 09/15/2018 03/15/2018  WBC 4.0 - 10.5 K/uL 6.9 6.6 7.0  Hemoglobin 12.0 - 15.0 g/dL 14.4 15.2(H) 14.4  Hematocrit 36.0 - 46.0 % 44.8 45.7 42.7  Platelets 150 - 400 K/uL 199 163 146(L)   CMP Latest Ref Rng & Units 03/21/2019 09/15/2018 03/15/2018  Glucose 70 - 99 mg/dL 108(H) 112(H) 101(H)  BUN 8 - 23 mg/dL 21 19 25(H)  Creatinine 0.44 - 1.00 mg/dL 0.75 0.82 0.80  Sodium 135 - 145 mmol/L 143 143 142  Potassium 3.5 - 5.1 mmol/L 4.7 4.8 4.4  Chloride 98 - 111 mmol/L 105 106 104  CO2 22 - 32 mmol/L _0 Calcium 8.9 - 10.3 mg/dL 9.6 9.7 9.2  Total Protein 6.5 - 8.1 g/dL 6.9 7.6 6.9  Total Bilirubin 0.3 - 1.2 mg/dL 0.6 0.8 0.8  Alkaline Phos 38 - 126 U/L 70 66 56  AST 15 - 41 U/L _1 ALT 0 - 44 U/L _2 Assessment:  Heather [name] is a 84 y.o. 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 Pathology revealed a 1.1 cm high-grade  adenocarcinoma with pleural invasion. Nodes were negative. Pathologic stage was T2aN1M0 (stage IB).  She is s/p wedge resection of a left lower lobe mass on 03/10/2017.  Pathology revealed an 8 mm invasive adenocarcinoma with solid and acinar patterns.  There was no visceral invasion.  There was no lymphovascular invasion.  All margins were negative.  Pathologic stage was T1aNx (stage IA). 1. Primary cancer of right upper lobe of lung ([place])   2. Centrilobular emphysema ([place])   Patient clinically doing well. CT chest without contrast on 03/19/2019 was independent reviewed by me discussed with patient. No evidence of local recurrence or metastatic disease in the chest. I recommend continue CT chest surveillance every 6 months.  History of erythrocytosis likely due to underlying lung disease, hemoglobin is 14.4.  Stable. History of alcohol use, she stopped drinking alcohol these days.  Thrombocytopenia has resolved.  B12 was checked and result is pending at the time of dictation.  #Centrilobular emphysema, continue singular and Advair inhalers.  RTC after CT scan in 6 months for MD assessment and labs (CBC with diff, CMP). Orders Placed This Encounter  Procedures  . CT Chest Wo Contrast    Standing Status:   Future    Standing Expiration Date:   03/20/2020    Order Specific Question:   ** REASON FOR EXAM (FREE TEXT)    Answer:   lung cancer follow up    Order Specific Question:   Preferred imaging location?    Answer:   [facility]    Order Specific Question:   Radiology Contrast Protocol - do NOT remove file path    Answer:   [email or web address]  . CBC with Differential/Platelet    Standing Status:   Future    Standing Expiration Date:   03/20/2020  . Comprehensive metabolic panel    Standing Status:   Future    Standing Expiration Date:   03/20/2020   We spent sufficient time to discuss many aspect of care, questions were answered to patient's  satisfaction.   Earlie Server, MD, PhD Hematology Oncology [facility] at [facility] Pager- [phone] 03/21/2019

## 2019-03-21 NOTE — Progress Notes (Signed)
Patient does not offer any problems today.  

## 2019-03-22 LAB — VITAMIN B12: Vitamin B-12: 494 pg/mL (ref 180–914)

## 2019-03-23 DIAGNOSIS — H43812 Vitreous degeneration, left eye: Secondary | ICD-10-CM | POA: Diagnosis not present

## 2019-08-29 DIAGNOSIS — E782 Mixed hyperlipidemia: Secondary | ICD-10-CM | POA: Diagnosis not present

## 2019-09-05 DIAGNOSIS — Z0000 Encounter for general adult medical examination without abnormal findings: Secondary | ICD-10-CM | POA: Diagnosis not present

## 2019-09-05 DIAGNOSIS — C3432 Malignant neoplasm of lower lobe, left bronchus or lung: Secondary | ICD-10-CM | POA: Diagnosis not present

## 2019-09-05 DIAGNOSIS — I471 Supraventricular tachycardia: Secondary | ICD-10-CM | POA: Diagnosis not present

## 2019-09-05 DIAGNOSIS — J431 Panlobular emphysema: Secondary | ICD-10-CM | POA: Diagnosis not present

## 2019-09-05 DIAGNOSIS — E782 Mixed hyperlipidemia: Secondary | ICD-10-CM | POA: Diagnosis not present

## 2019-09-18 ENCOUNTER — Ambulatory Visit
Admission: RE | Admit: 2019-09-18 | Discharge: 2019-09-18 | Disposition: A | Discharge status: Home / Self Care | Payer: PPO | Source: Ambulatory Visit | Attending: Oncology | Admitting: Oncology

## 2019-09-18 ENCOUNTER — Other Ambulatory Visit: Payer: Self-pay

## 2019-09-18 DIAGNOSIS — C3432 Malignant neoplasm of lower lobe, left bronchus or lung: Secondary | ICD-10-CM | POA: Diagnosis not present

## 2019-09-18 DIAGNOSIS — C3411 Malignant neoplasm of upper lobe, right bronchus or lung: Secondary | ICD-10-CM | POA: Diagnosis not present

## 2019-09-18 DIAGNOSIS — M7732 Calcaneal spur, left foot: Secondary | ICD-10-CM | POA: Diagnosis not present

## 2019-09-18 DIAGNOSIS — K449 Diaphragmatic hernia without obstruction or gangrene: Secondary | ICD-10-CM | POA: Diagnosis not present

## 2019-09-18 DIAGNOSIS — J432 Centrilobular emphysema: Secondary | ICD-10-CM | POA: Diagnosis not present

## 2019-09-18 DIAGNOSIS — I700 Atherosclerosis of aorta: Secondary | ICD-10-CM | POA: Diagnosis not present

## 2019-09-18 DIAGNOSIS — M7662 Achilles tendinitis, left leg: Secondary | ICD-10-CM | POA: Diagnosis not present

## 2019-09-20 ENCOUNTER — Other Ambulatory Visit: Payer: Self-pay

## 2019-09-20 ENCOUNTER — Inpatient Hospital Stay: Payer: PPO | Attending: Oncology

## 2019-09-20 ENCOUNTER — Encounter: Payer: Self-pay | Admitting: Oncology

## 2019-09-20 ENCOUNTER — Inpatient Hospital Stay (HOSPITAL_BASED_OUTPATIENT_CLINIC_OR_DEPARTMENT_OTHER): Payer: PPO | Admitting: Oncology

## 2019-09-20 VITALS — BP 168/76 | HR 61 | Temp 97.4°F | Resp 16 | Wt 145.6 lb

## 2019-09-20 DIAGNOSIS — Z87891 Personal history of nicotine dependence: Secondary | ICD-10-CM | POA: Insufficient documentation

## 2019-09-20 DIAGNOSIS — I10 Essential (primary) hypertension: Secondary | ICD-10-CM | POA: Insufficient documentation

## 2019-09-20 DIAGNOSIS — Z7952 Long term (current) use of systemic steroids: Secondary | ICD-10-CM | POA: Insufficient documentation

## 2019-09-20 DIAGNOSIS — N1330 Unspecified hydronephrosis: Secondary | ICD-10-CM | POA: Insufficient documentation

## 2019-09-20 DIAGNOSIS — K219 Gastro-esophageal reflux disease without esophagitis: Secondary | ICD-10-CM | POA: Diagnosis not present

## 2019-09-20 DIAGNOSIS — J432 Centrilobular emphysema: Secondary | ICD-10-CM | POA: Insufficient documentation

## 2019-09-20 DIAGNOSIS — E782 Mixed hyperlipidemia: Secondary | ICD-10-CM | POA: Diagnosis not present

## 2019-09-20 DIAGNOSIS — C3431 Malignant neoplasm of lower lobe, right bronchus or lung: Secondary | ICD-10-CM | POA: Insufficient documentation

## 2019-09-20 DIAGNOSIS — I6529 Occlusion and stenosis of unspecified carotid artery: Secondary | ICD-10-CM | POA: Diagnosis not present

## 2019-09-20 DIAGNOSIS — Z8719 Personal history of other diseases of the digestive system: Secondary | ICD-10-CM | POA: Insufficient documentation

## 2019-09-20 DIAGNOSIS — C3411 Malignant neoplasm of upper lobe, right bronchus or lung: Secondary | ICD-10-CM | POA: Diagnosis not present

## 2019-09-20 DIAGNOSIS — Z79899 Other long term (current) drug therapy: Secondary | ICD-10-CM | POA: Insufficient documentation

## 2019-09-20 DIAGNOSIS — Z85118 Personal history of other malignant neoplasm of bronchus and lung: Secondary | ICD-10-CM

## 2019-09-20 LAB — CBC WITH DIFFERENTIAL/PLATELET
Abs Immature Granulocytes: 0.02 10*3/uL (ref 0.00–0.07)
Basophils Absolute: 0 10*3/uL (ref 0.0–0.1)
Basophils Relative: 0 %
Eosinophils Absolute: 0 10*3/uL (ref 0.0–0.5)
Eosinophils Relative: 0 %
HCT: 41.1 % (ref 36.0–46.0)
Hemoglobin: 13.9 g/dL (ref 12.0–15.0)
Immature Granulocytes: 0 %
Lymphocytes Relative: 35 %
Lymphs Abs: 3.2 10*3/uL (ref 0.7–4.0)
MCH: 33.1 pg (ref 26.0–34.0)
MCHC: 33.8 g/dL (ref 30.0–36.0)
MCV: 97.9 fL (ref 80.0–100.0)
Monocytes Absolute: 0.8 10*3/uL (ref 0.1–1.0)
Monocytes Relative: 8 %
Neutro Abs: 5 10*3/uL (ref 1.7–7.7)
Neutrophils Relative %: 57 %
Platelets: 177 10*3/uL (ref 150–400)
RBC: 4.2 MIL/uL (ref 3.87–5.11)
RDW: 13.1 % (ref 11.5–15.5)
WBC: 9 10*3/uL (ref 4.0–10.5)
nRBC: 0 % (ref 0.0–0.2)

## 2019-09-20 LAB — COMPREHENSIVE METABOLIC PANEL
ALT: 18 U/L (ref 0–44)
AST: 23 U/L (ref 15–41)
Albumin: 4.4 g/dL (ref 3.5–5.0)
Alkaline Phosphatase: 58 U/L (ref 38–126)
Anion gap: 10 (ref 5–15)
BUN: 20 mg/dL (ref 8–23)
CO2: 26 mmol/L (ref 22–32)
Calcium: 9.2 mg/dL (ref 8.9–10.3)
Chloride: 106 mmol/L (ref 98–111)
Creatinine, Ser: 0.88 mg/dL (ref 0.44–1.00)
GFR calc Af Amer: >60 mL/min (ref >60)
GFR calc non Af Amer: >60 mL/min (ref >60)
Glucose, Bld: 101 mg/dL — ABNORMAL HIGH (ref 70–99)
Potassium: 3.8 mmol/L (ref 3.5–5.1)
Sodium: 142 mmol/L (ref 135–145)
Total Bilirubin: 0.5 mg/dL (ref 0.3–1.2)
Total Protein: 7.1 g/dL (ref 6.5–8.1)

## 2019-09-20 NOTE — Progress Notes (Signed)
Patient denies new problems/concerns today.   °

## 2019-09-20 NOTE — Progress Notes (Signed)
[facility] day:  09/20/2019    Chief Complaint: Heather [name] is a 84 y.o. 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  PERTINENT ONCOLOGY HISTORY Previously follows up with Dr.Corcoran. [place] care with me on 03/15/2018  PET scan on 02/13/2014 revealed a 1 cm right upper lobe hypermetabolic nodule (SUV 3.8) and a 6 mm right upper lobe nodule (no metabolic activity). There was no mediastianl adenopathy.  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 Pathology revealed a 1.1 cm high-grade adenocarcinoma with pleural invasion. Nodes were negative. Pathologic stage was T2aN1M0 (stage IB).  Chest CT on 02/10/2017 revealed clear interval progression of the posterior left lower lobe pulmonary nodule, now measuring 12 mm in long axis and having a distinctly lobular contour. Imaging features were very concerning for neoplasm.  There was no change in the 7 mm nodule posterior to the right hilum.  PET scan on 02/18/2017 revealed a 11 x 8 mm posterior left lower lobe pulmonary nodule that had mildly progressed from prior studies and demonstrated mild hypermetabolism (SUV 1.6).  The appearance was considered worrisome for an indolent primary bronchogenic neoplasm.  03/10/2017.  s/p wedge resection of a left lower lobe mass.   Pathology revealed an 8 mm invasive adenocarcinoma with solid and acinar patterns.  There was no visceral invasion.  There was no lymphovascular invasion.  All margins were negative.  Pathologic stage was T1aNx (stage IA).   Chest CT on 09/07/2017 revealed stable postsurgical change in the RIGHT upper lobe and LEFT lower lobe without evidence local recurrence.  There was a stable 8 mm nodule in the central RIGHT upper lobe.  There was upper lobe  centrilobular emphysema.  CEA has been followed: 4.7 on 10/01/2014, 4.5 on 07/31/2015, 3.9 on 05/03/2016, and 3.9 on 02/11/2017.  Bilateral mammogram on 07/22/2016 revealed no evidence of malignancy.   INTERVAL HISTORY Heather [name] is a 84 y.o. female who has above history reviewed by me today presents for follow up visit for management of history of lung cancer, 6 months assessment.  Problems and complaints are listed below: She has cough recently and also left achilles bursitis.  Denies any SOB more than her baseline, no unintended weight loss, appetite is ok.  Patient has had interval CT scan done and present to discuss results.    Past Medical History:  Diagnosis Date   Brain aneurysm    Cancer [facility]) lung   2016   Carotid artery stenosis    Carotid stenosis 06/19/2013   Overview:  Normal study, 1/16   Colonic polyp    GERD (gastroesophageal reflux disease)    Hiatal hernia    Hypercholesteremia    Hyperlipidemia, mixed 06/09/2015   Hypertension    Lung nodule 06/21/2014   Macrocytic 10/06/2014   Medicare annual wellness visit, initial 07/08/2016   Overview:  6/18   PAT (paroxysmal atrial tachycardia) ([place]) 06/19/2013   Plantar fasciitis    Tachycardia, paroxysmal ([place]) 03/31/14    Past Surgical History:  Procedure Laterality Date   BREAST EXCISIONAL BIOPSY Right 40 yrs ago   neg   BREAST LUMPECTOMY     benign   CATARACT EXTRACTION W/ INTRAOCULAR LENS  IMPLANT, BILATERAL Bilateral    CEREBRAL ANEURYSM REPAIR  2004   CERVIX LESION DESTRUCTION  COLONOSCOPY WITH PROPOFOL N/A 03/31/2015   Procedure: COLONOSCOPY WITH PROPOFOL;  Surgeon: Manya Silvas, MD;  Location: [facility];  Service: Endoscopy;  Laterality: N/A;   FLEXIBLE BRONCHOSCOPY N/A 03/10/2017   Procedure: FLEXIBLE BRONCHOSCOPY;  Surgeon: Nestor Lewandowsky, MD;  Location: [facility] ORS;  Service: Thoracic;  Laterality: N/A;   THORACOTOMY/LOBECTOMY Left 03/10/2017   Procedure: THORACOTOMY WITH  LUNG WEDGE RESECTION POSSIBLE LOBECTOMY;  Surgeon: Nestor Lewandowsky, MD;  Location: [facility] ORS;  Service: Thoracic;  Laterality: Left;   TUBAL LIGATION      Family History  Problem Relation Age of Onset   Alcohol abuse Mother    Heart attack Father    Breast cancer Neg Hx     Social History:  reports that she quit smoking about 31 years ago. She quit after 35.00 years of use. She has never used smokeless tobacco. She reports current alcohol use. She reports that she does not use drugs.   Allergies:  Allergies  Allergen Reactions   Ace Inhibitors Swelling   Contrast Media [Iodinated Diagnostic Agents]    Cortisone     Patient had facial swelling and itching from cortisone injection IM    Current Medications: Current Outpatient Medications  Medication Sig Dispense Refill   ALPRAZolam (XANAX) 0.25 MG tablet Take 0.25 mg by mouth 2 (two) times daily as needed for anxiety.      amLODipine (NORVASC) 5 MG tablet Take by mouth.     amoxicillin (AMOXIL) 500 MG capsule      atenolol (TENORMIN) 50 MG tablet Take 50 mg by mouth at bedtime.     cetirizine (ZYRTEC) 10 MG tablet Take 10 mg by mouth daily.     Cholecalciferol 1000 UNITS tablet Take 1,000 Units by mouth daily.     donepezil (ARICEPT) 5 MG tablet Take 5 mg by mouth at bedtime.      fluticasone (FLONASE) 50 MCG/ACT nasal spray Place 1 spray into the nose daily as needed for allergies.      Fluticasone-Salmeterol (ADVAIR) 100-50 MCG/DOSE AEPB Inhale into the lungs.     hydrochlorothiazide (HYDRODIURIL) 25 MG tablet Take 25 mg by mouth daily as needed.      predniSONE (DELTASONE) 10 MG tablet Take 10 mg by mouth daily with breakfast. 7 day courese     Coenzyme Q10 (CO Q 10) 100 MG CAPS Take 1 capsule by mouth daily. (Patient not taking: Reported on 09/20/2019)     montelukast (SINGULAIR) 10 MG tablet Take 10 mg by mouth daily. (Patient not taking: Reported on 09/20/2019)     oxyCODONE-acetaminophen (PERCOCET/ROXICET)  5-325 MG tablet Take 1-2 tablets by mouth every 4 (four) hours as needed for moderate pain. (Patient not taking: Reported on 03/15/2018) 30 tablet 0   simvastatin (ZOCOR) 20 MG tablet Take 20 mg by mouth daily. (Patient not taking: Reported on 09/20/2019)     No current facility-administered medications for this visit.   Facility-Administered Medications Ordered in Other Visits  Medication Dose Route Frequency Provider Last Rate Last Admin   0.9 %  sodium chloride infusion   Intravenous Continuous Manya Silvas, MD       Review of Systems  Constitutional: Negative for appetite change, chills, fatigue and fever.  HENT:   Negative for hearing loss and voice change.   Eyes: Negative for eye problems.  Respiratory: Positive for cough. Negative for chest tightness.   Cardiovascular: Negative for chest pain.  Gastrointestinal: Negative for abdominal distention, abdominal pain and blood in stool.  Endocrine: Negative  for hot flashes.  Genitourinary: Negative for difficulty urinating and frequency.   Musculoskeletal: Negative for arthralgias.       Chronic intermittent back pain  Skin: Negative for itching and rash.  Neurological: Negative for extremity weakness.  Hematological: Negative for adenopathy.  Psychiatric/Behavioral: Negative for confusion.    Performance status (ECOG): 0 - Asymptomatic  Vital Signs BP (!) 168/76    Pulse 61    Temp (!) 97.4 F (36.3 C)    Resp 16    Wt 145 lb 9.6 oz (66 kg)    BMI 26.63 kg/m   Physical Exam Constitutional:      General: She is not in acute distress.    Appearance: She is not diaphoretic.  HENT:     Head: Normocephalic and atraumatic.     Mouth/Throat:     Pharynx: No oropharyngeal exudate.  Eyes:     General: No scleral icterus.    Pupils: Pupils are equal, round, and reactive to light.  Cardiovascular:     Rate and Rhythm: Normal rate and regular rhythm.     Heart sounds: No murmur heard.   Pulmonary:     Effort: Pulmonary  effort is normal. No respiratory distress.  Abdominal:     General: There is no distension.     Palpations: Abdomen is soft.     Tenderness: There is no abdominal tenderness.  Musculoskeletal:        General: Normal range of motion.     Cervical back: Normal range of motion and neck supple.  Skin:    General: Skin is warm and dry.     Findings: No erythema.  Neurological:     Mental Status: She is alert and oriented to person, place, and time.  Psychiatric:        Mood and Affect: Affect normal.     RADIOGRAPHIC STUDIES: I have personally reviewed the radiological images as listed and agreed with the findings in the report. CT Chest Wo Contrast  Result Date: 09/18/2019 CLINICAL DATA:  Right upper lobe lung adenocarcinoma status post wedge resection in 2016. Left lower lobe lung cancer status post wedge resection in 2019. Former smoker. Patient presents for routine restaging. EXAM: CT CHEST WITHOUT CONTRAST TECHNIQUE: Multidetector CT imaging of the chest was performed following the standard protocol without IV contrast. COMPARISON:  09/13/2018 FINDINGS: Cardiovascular: The heart size is normal. No substantial pericardial effusion. Coronary artery calcification is evident. Atherosclerotic calcification is noted in the wall of the thoracic aorta. Mediastinum/Nodes: No mediastinal lymphadenopathy. Upper normal precarinal node is stable No evidence for gross hilar lymphadenopathy although assessment is limited by the lack of intravenous contrast on today's study. Tiny hiatal hernia. The esophagus has normal imaging features. There is no axillary lymphadenopathy. Lungs/Pleura: Centrilobular and paraseptal emphysema evident. Stable biapical pleuroparenchymal scarring. Surgical staple line noted anterior right upper lobe consistent with prior wedge resection. The 7 mm parahilar posterior right upper lobe nodule identified previously is stable on image 56/series 3 today. Previously identified 3 mm  right middle lobe nodule is unchanged (90/3). Staple line also noted posterior left lower lobe consistent with reported surgical history. 8 mm subpleural nodule identified inferior left lower lobe previously is 7 mm today on image 122/3. No new suspicious pulmonary nodule or mass. No focal airspace consolidation. No pleural effusion. Upper Abdomen: 6 mm hypoattenuating lesion posterior right liver is stable. Cysts identified in the left liver are also similar to prior measuring up to 1.9 cm on image 118/2. Stable  mild left hydronephrosis, incompletely visualized. Musculoskeletal: No worrisome lytic or sclerotic osseous abnormality. IMPRESSION: 1. Stable exam. No new or progressive interval findings to suggest recurrent or metastatic disease in the chest. 2. Persistent mild left hydronephrosis, stable. 3.  Emphysema (ICD10-J43.9) and Aortic Atherosclerosis (ICD10-170.0) Electronically Signed   By: Misty Stanley M.D.   On: 09/18/2019 10:33    Laboratory results were reviewed by me CBC Latest Ref Rng & Units 09/20/2019 03/21/2019 09/15/2018  WBC 4.0 - 10.5 K/uL 9.0 6.9 6.6  Hemoglobin 12.0 - 15.0 g/dL 13.9 14.4 15.2(H)  Hematocrit 36 - 46 % 41.1 44.8 45.7  Platelets 150 - 400 K/uL 177 199 163   CMP Latest Ref Rng & Units 09/20/2019 03/21/2019 09/15/2018  Glucose 70 - 99 mg/dL 101(H) 108(H) 112(H)  BUN 8 - 23 mg/dL _0 Creatinine 0.44 - 1.00 mg/dL 0.88 0.75 0.82  Sodium 135 - 145 mmol/L 142 143 143  Potassium 3.5 - 5.1 mmol/L 3.8 4.7 4.8  Chloride 98 - 111 mmol/L 106 105 106  CO2 22 - 32 mmol/L _1 Calcium 8.9 - 10.3 mg/dL 9.2 9.6 9.7  Total Protein 6.5 - 8.1 g/dL 7.1 6.9 7.6  Total Bilirubin 0.3 - 1.2 mg/dL 0.5 0.6 0.8  Alkaline Phos 38 - 126 U/L 58 70 66  AST 15 - 41 U/L _2 ALT 0 - 44 U/L _3 Assessment:  Heather [name] is a 84 y.o. 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 Pathology revealed a 1.1 cm  high-grade adenocarcinoma with pleural invasion. Nodes were negative. Pathologic stage was T2aN1M0 (stage IB).  She is s/p wedge resection of a left lower lobe mass on 03/10/2017.  Pathology revealed an 8 mm invasive adenocarcinoma with solid and acinar patterns.  There was no visceral invasion.  There was no lymphovascular invasion.  All margins were negative.  Pathologic stage was T1aNx (stage IA). 1. History of lung cancer   2. Primary cancer of right upper lobe of lung [facility])    Patient is doing well clinically.  CT chest without contrast 09/18/2019 was independently reviewed by me and discussed with patient.  Stable exam no new or progression of disease.  Persistent hydronephrosis, stable. Emphysema.  Recommend to obtain CT chest wo contrast in 6 months.   History of erythrocytosis likely due to underlying lung disease, hemoglobin is currently within normal limits.  History of alcohol use, she stopped drinking alcohol these days.  Thrombocytopenia has resolved.  B12 was checked and result is pending at the time of dictation.  #Centrilobular emphysema, continue singular and Advair inhalers.  RTC after CT scan in 6 months for MD assessment and labs (CBC with diff, CMP). Orders Placed This Encounter  Procedures   CT CHEST WO CONTRAST    Standing Status:   Future    Standing Expiration Date:   09/19/2020    Order Specific Question:   Preferred imaging location?    Answer:   [facility]    Order Specific Question:   Radiology Contrast Protocol - do NOT remove file path    Answer:   [email or web address]   CBC with Differential/Platelet    Standing Status:   Future    Standing Expiration Date:   09/19/2020   Comprehensive metabolic panel    Standing Status:   Future    Standing Expiration Date:   09/19/2020   We spent  sufficient time to discuss many aspect of care, questions were answered to patient's satisfaction.   Earlie Server, MD, PhD Hematology  Oncology [facility] at [facility] Pager- [phone] 09/20/2019

## 2019-09-28 DIAGNOSIS — Z20822 Contact with and (suspected) exposure to covid-19: Secondary | ICD-10-CM | POA: Diagnosis not present

## 2020-03-21 ENCOUNTER — Other Ambulatory Visit
Admission: RE | Admit: 2020-03-21 | Discharge: 2020-03-21 | Disposition: A | Discharge status: Home / Self Care | Payer: Medicare HMO | Source: Ambulatory Visit | Attending: Oncology | Admitting: Oncology

## 2020-03-21 ENCOUNTER — Ambulatory Visit
Admission: RE | Admit: 2020-03-21 | Discharge: 2020-03-21 | Disposition: A | Discharge status: Home / Self Care | Payer: Medicare HMO | Source: Ambulatory Visit | Attending: Oncology | Admitting: Oncology

## 2020-03-21 ENCOUNTER — Other Ambulatory Visit: Payer: Self-pay

## 2020-03-21 DIAGNOSIS — C3411 Malignant neoplasm of upper lobe, right bronchus or lung: Secondary | ICD-10-CM

## 2020-03-21 DIAGNOSIS — Z85118 Personal history of other malignant neoplasm of bronchus and lung: Secondary | ICD-10-CM

## 2020-03-21 LAB — COMPREHENSIVE METABOLIC PANEL
ALT: 21 U/L (ref 0–44)
AST: 22 U/L (ref 15–41)
Albumin: 4.2 g/dL (ref 3.5–5.0)
Alkaline Phosphatase: 54 U/L (ref 38–126)
Anion gap: 7 (ref 5–15)
BUN: 19 mg/dL (ref 8–23)
CO2: 26 mmol/L (ref 22–32)
Calcium: 9.5 mg/dL (ref 8.9–10.3)
Chloride: 106 mmol/L (ref 98–111)
Creatinine, Ser: 0.78 mg/dL (ref 0.44–1.00)
GFR, Estimated: >60 mL/min (ref >60)
Glucose, Bld: 107 mg/dL — ABNORMAL HIGH (ref 70–99)
Potassium: 4 mmol/L (ref 3.5–5.1)
Sodium: 139 mmol/L (ref 135–145)
Total Bilirubin: 0.6 mg/dL (ref 0.3–1.2)
Total Protein: 6.9 g/dL (ref 6.5–8.1)

## 2020-03-21 LAB — CBC WITH DIFFERENTIAL/PLATELET
Abs Immature Granulocytes: 0.02 10*3/uL (ref 0.00–0.07)
Basophils Absolute: 0 10*3/uL (ref 0.0–0.1)
Basophils Relative: 1 %
Eosinophils Absolute: 0.1 10*3/uL (ref 0.0–0.5)
Eosinophils Relative: 1 %
HCT: 47.8 % — ABNORMAL HIGH (ref 36.0–46.0)
Hemoglobin: 16.2 g/dL — ABNORMAL HIGH (ref 12.0–15.0)
Immature Granulocytes: 0 %
Lymphocytes Relative: 31 %
Lymphs Abs: 1.9 10*3/uL (ref 0.7–4.0)
MCH: 33.4 pg (ref 26.0–34.0)
MCHC: 33.9 g/dL (ref 30.0–36.0)
MCV: 98.6 fL (ref 80.0–100.0)
Monocytes Absolute: 0.5 10*3/uL (ref 0.1–1.0)
Monocytes Relative: 8 %
Neutro Abs: 3.5 10*3/uL (ref 1.7–7.7)
Neutrophils Relative %: 59 %
Platelets: 204 10*3/uL (ref 150–400)
RBC: 4.85 MIL/uL (ref 3.87–5.11)
RDW: 12.5 % (ref 11.5–15.5)
WBC: 5.9 10*3/uL (ref 4.0–10.5)
nRBC: 0 % (ref 0.0–0.2)

## 2020-03-24 ENCOUNTER — Inpatient Hospital Stay: Payer: Medicare HMO | Attending: Oncology

## 2020-03-24 ENCOUNTER — Other Ambulatory Visit: Payer: Self-pay

## 2020-03-24 ENCOUNTER — Ambulatory Visit: Payer: Medicare HMO

## 2020-03-24 ENCOUNTER — Encounter: Payer: Self-pay | Admitting: Oncology

## 2020-03-24 ENCOUNTER — Inpatient Hospital Stay (HOSPITAL_BASED_OUTPATIENT_CLINIC_OR_DEPARTMENT_OTHER): Payer: Medicare HMO | Admitting: Oncology

## 2020-03-24 VITALS — BP 168/72 | HR 78 | Temp 97.2°F | Resp 18 | Wt 140.4 lb

## 2020-03-24 DIAGNOSIS — J432 Centrilobular emphysema: Secondary | ICD-10-CM | POA: Insufficient documentation

## 2020-03-24 DIAGNOSIS — I471 Supraventricular tachycardia: Secondary | ICD-10-CM | POA: Insufficient documentation

## 2020-03-24 DIAGNOSIS — K449 Diaphragmatic hernia without obstruction or gangrene: Secondary | ICD-10-CM | POA: Diagnosis not present

## 2020-03-24 DIAGNOSIS — Z86010 Personal history of colonic polyps: Secondary | ICD-10-CM | POA: Insufficient documentation

## 2020-03-24 DIAGNOSIS — Z79899 Other long term (current) drug therapy: Secondary | ICD-10-CM | POA: Insufficient documentation

## 2020-03-24 DIAGNOSIS — E785 Hyperlipidemia, unspecified: Secondary | ICD-10-CM | POA: Diagnosis not present

## 2020-03-24 DIAGNOSIS — Z87891 Personal history of nicotine dependence: Secondary | ICD-10-CM | POA: Insufficient documentation

## 2020-03-24 DIAGNOSIS — I2510 Atherosclerotic heart disease of native coronary artery without angina pectoris: Secondary | ICD-10-CM | POA: Insufficient documentation

## 2020-03-24 DIAGNOSIS — C3411 Malignant neoplasm of upper lobe, right bronchus or lung: Secondary | ICD-10-CM

## 2020-03-24 DIAGNOSIS — I700 Atherosclerosis of aorta: Secondary | ICD-10-CM | POA: Diagnosis not present

## 2020-03-24 DIAGNOSIS — I10 Essential (primary) hypertension: Secondary | ICD-10-CM | POA: Insufficient documentation

## 2020-03-24 DIAGNOSIS — D751 Secondary polycythemia: Secondary | ICD-10-CM | POA: Diagnosis not present

## 2020-03-24 DIAGNOSIS — K219 Gastro-esophageal reflux disease without esophagitis: Secondary | ICD-10-CM | POA: Diagnosis not present

## 2020-03-24 DIAGNOSIS — K7689 Other specified diseases of liver: Secondary | ICD-10-CM | POA: Insufficient documentation

## 2020-03-24 DIAGNOSIS — Z85118 Personal history of other malignant neoplasm of bronchus and lung: Secondary | ICD-10-CM | POA: Insufficient documentation

## 2020-03-24 DIAGNOSIS — C3432 Malignant neoplasm of lower lobe, left bronchus or lung: Secondary | ICD-10-CM | POA: Insufficient documentation

## 2020-03-24 LAB — CBC WITH DIFFERENTIAL/PLATELET
Abs Immature Granulocytes: 0.02 10*3/uL (ref 0.00–0.07)
Basophils Absolute: 0 10*3/uL (ref 0.0–0.1)
Basophils Relative: 0 %
Eosinophils Absolute: 0 10*3/uL (ref 0.0–0.5)
Eosinophils Relative: 1 %
HCT: 44.9 % (ref 36.0–46.0)
Hemoglobin: 14.9 g/dL (ref 12.0–15.0)
Immature Granulocytes: 0 %
Lymphocytes Relative: 23 %
Lymphs Abs: 1.5 10*3/uL (ref 0.7–4.0)
MCH: 33 pg (ref 26.0–34.0)
MCHC: 33.2 g/dL (ref 30.0–36.0)
MCV: 99.6 fL (ref 80.0–100.0)
Monocytes Absolute: 0.5 10*3/uL (ref 0.1–1.0)
Monocytes Relative: 7 %
Neutro Abs: 4.5 10*3/uL (ref 1.7–7.7)
Neutrophils Relative %: 69 %
Platelets: 172 10*3/uL (ref 150–400)
RBC: 4.51 MIL/uL (ref 3.87–5.11)
RDW: 12.5 % (ref 11.5–15.5)
WBC: 6.6 10*3/uL (ref 4.0–10.5)
nRBC: 0 % (ref 0.0–0.2)

## 2020-03-24 NOTE — Progress Notes (Signed)
[facility] day:  03/24/2020    Chief Complaint: Heather [name] is a 85 y.o. 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  PERTINENT ONCOLOGY HISTORY Previously follows up with Dr.Corcoran. [place] care with me on 03/15/2018  PET scan on 02/13/2014 revealed a 1 cm right upper lobe hypermetabolic nodule (SUV 3.8) and a 6 mm right upper lobe nodule (no metabolic activity). There was no mediastianl adenopathy.  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 Pathology revealed a 1.1 cm high-grade adenocarcinoma with pleural invasion. Nodes were negative. Pathologic stage was T2aN1M0 (stage IB).  Chest CT on 02/10/2017 revealed clear interval progression of the posterior left lower lobe pulmonary nodule, now measuring 12 mm in long axis and having a distinctly lobular contour. Imaging features were very concerning for neoplasm.  There was no change in the 7 mm nodule posterior to the right hilum.  PET scan on 02/18/2017 revealed a 11 x 8 mm posterior left lower lobe pulmonary nodule that had mildly progressed from prior studies and demonstrated mild hypermetabolism (SUV 1.6).  The appearance was considered worrisome for an indolent primary bronchogenic neoplasm.  03/10/2017.  s/p wedge resection of a left lower lobe mass.   Pathology revealed an 8 mm invasive adenocarcinoma with solid and acinar patterns.  There was no visceral invasion.  There was no lymphovascular invasion.  All margins were negative.  Pathologic stage was T1aNx (stage IA).   Chest CT on 09/07/2017 revealed stable postsurgical change in the RIGHT upper lobe and LEFT lower lobe without evidence local recurrence.  There was a stable 8 mm nodule in the central RIGHT upper lobe.  There was upper lobe  centrilobular emphysema.  CEA has been followed: 4.7 on 10/01/2014, 4.5 on 07/31/2015, 3.9 on 05/03/2016, and 3.9 on 02/11/2017.  Bilateral mammogram on 07/22/2016 revealed no evidence of malignancy.   INTERVAL HISTORY Heather [name] is a 85 y.o. female who has above history reviewed by me today presents for follow up visit for management of history of lung cancer, 6 months assessment.  Problems and complaints are listed below: Patient has had interval CT scan done and presents to discuss results. No new complaints.     Past Medical History:  Diagnosis Date  . Brain aneurysm   . Cancer ([place]) lung   2016  . Carotid artery stenosis   . Carotid stenosis 06/19/2013   Overview:  Normal study, 1/16  . Colonic polyp   . GERD (gastroesophageal reflux disease)   . Hiatal hernia   . Hypercholesteremia   . Hyperlipidemia, mixed 06/09/2015  . Hypertension   . Lung nodule 06/21/2014  . Macrocytic 10/06/2014  . Medicare annual wellness visit, initial 07/08/2016   Overview:  6/18  . PAT (paroxysmal atrial tachycardia) ([place]) 06/19/2013  . Plantar fasciitis   . Tachycardia, paroxysmal ([place]) 03/31/14    Past Surgical History:  Procedure Laterality Date  . BREAST EXCISIONAL BIOPSY Right 40 yrs ago   neg  . BREAST LUMPECTOMY     benign  . CATARACT EXTRACTION W/ INTRAOCULAR LENS  IMPLANT, BILATERAL Bilateral   . CEREBRAL ANEURYSM REPAIR  2004  . CERVIX LESION DESTRUCTION    . COLONOSCOPY WITH PROPOFOL N/A 03/31/2015   Procedure: COLONOSCOPY WITH PROPOFOL;  Surgeon: Manya Silvas, MD;  Location: [facility] ENDOSCOPY;  Service: Endoscopy;  Laterality: N/A;  . FLEXIBLE BRONCHOSCOPY N/A 03/10/2017   Procedure: FLEXIBLE BRONCHOSCOPY;  Surgeon: Nestor Lewandowsky, MD;  Location: [facility] ORS;  Service: Thoracic;  Laterality: N/A;  . THORACOTOMY/LOBECTOMY Left 03/10/2017   Procedure: THORACOTOMY WITH LUNG WEDGE RESECTION POSSIBLE LOBECTOMY;  Surgeon: Nestor Lewandowsky, MD;  Location: [facility] ORS;  Service: Thoracic;   Laterality: Left;  . TUBAL LIGATION      Family History  Problem Relation Age of Onset  . Alcohol abuse Mother   . Heart attack Father   . Breast cancer Neg Hx     Social History:  reports that Heather [name] quit smoking about 31 years ago. Heather [name] quit after 35.00 years of use. Heather [name] has never used smokeless tobacco. Heather [name] reports current alcohol use. Heather [name] reports that Heather [name] does not use drugs.   Allergies:  Allergies  Allergen Reactions  . Ace Inhibitors Swelling  . Contrast Media [Iodinated Diagnostic Agents]   . Cortisone     Patient had facial swelling and itching from cortisone injection IM    Current Medications: Current Outpatient Medications  Medication Sig Dispense Refill  . ALPRAZolam (XANAX) 0.25 MG tablet Take 0.25 mg by mouth 2 (two) times daily as needed for anxiety.     Marland Kitchen amLODipine (NORVASC) 5 MG tablet Take by mouth.    Marland Kitchen atenolol (TENORMIN) 50 MG tablet Take 50 mg by mouth at bedtime.    . cetirizine (ZYRTEC) 10 MG tablet Take 10 mg by mouth daily.    . Cholecalciferol 1000 UNITS tablet Take 1,000 Units by mouth daily.    . Coenzyme Q10 (CO Q 10) 100 MG CAPS Take 1 capsule by mouth daily.    Marland Kitchen donepezil (ARICEPT) 5 MG tablet Take 5 mg by mouth at bedtime.     . fluticasone (FLONASE) 50 MCG/ACT nasal spray Place 1 spray into the nose daily as needed for allergies.     . Fluticasone-Salmeterol (ADVAIR) 100-50 MCG/DOSE AEPB Inhale into the lungs.    Marland Kitchen oxyCODONE-acetaminophen (PERCOCET/ROXICET) 5-325 MG tablet Take 1-2 tablets by mouth every 4 (four) hours as needed for moderate pain. 30 tablet 0  . simvastatin (ZOCOR) 20 MG tablet Take 20 mg by mouth daily.    Marland Kitchen amoxicillin (AMOXIL) 500 MG capsule  (Patient not taking: Reported on 03/24/2020)    . hydrochlorothiazide (HYDRODIURIL) 25 MG tablet Take 25 mg by mouth daily as needed.  (Patient not taking: Reported on 03/24/2020)     No current facility-administered medications for this visit.   Facility-Administered Medications Ordered  in Other Visits  Medication Dose Route Frequency Provider Last Rate Last Admin  . 0.9 %  sodium chloride infusion   Intravenous Continuous Manya Silvas, MD       Review of Systems  Constitutional: Negative for appetite change, chills, fatigue and fever.  HENT:   Negative for hearing loss and voice change.   Eyes: Negative for eye problems.  Respiratory: Positive for cough. Negative for chest tightness.   Cardiovascular: Negative for chest pain.  Gastrointestinal: Negative for abdominal distention, abdominal pain and blood in stool.  Endocrine: Negative for hot flashes.  Genitourinary: Negative for difficulty urinating and frequency.   Musculoskeletal: Negative for arthralgias.       Chronic intermittent back pain  Skin: Negative for itching and rash.  Neurological: Negative for extremity weakness.  Hematological: Negative for adenopathy.  Psychiatric/Behavioral: Negative for confusion.    Performance status (ECOG): 0 - Asymptomatic  Vital Signs BP (!) 168/72 (  BP Location: Left Arm, Patient Position: Sitting)   Pulse 78   Temp (!) 97.2 F (36.2 C)   Resp 18   Wt 140 lb 6.4 oz (63.7 kg)   BMI 25.68 kg/m   Physical Exam Constitutional:      General: Heather [name] is not in acute distress.    Appearance: Heather [name] is not diaphoretic.  HENT:     Head: Normocephalic and atraumatic.     Mouth/Throat:     Pharynx: No oropharyngeal exudate.  Eyes:     General: No scleral icterus.    Pupils: Pupils are equal, round, and reactive to light.  Cardiovascular:     Rate and Rhythm: Normal rate and regular rhythm.     Heart sounds: No murmur heard.   Pulmonary:     Effort: Pulmonary effort is normal. No respiratory distress.  Abdominal:     General: There is no distension.     Palpations: Abdomen is soft.     Tenderness: There is no abdominal tenderness.  Musculoskeletal:        General: Normal range of motion.     Cervical back: Normal range of motion and neck supple.  Skin:     General: Skin is warm and dry.     Findings: No erythema.  Neurological:     Mental Status: Heather [name] is alert and oriented to person, place, and time.  Psychiatric:        Mood and Affect: Affect normal.     RADIOGRAPHIC STUDIES: I have personally reviewed the radiological images as listed and agreed with the findings in the report. CT CHEST WO CONTRAST  Result Date: 03/21/2020 CLINICAL DATA:  Follow-up lung cancer EXAM: CT CHEST WITHOUT CONTRAST TECHNIQUE: Multidetector CT imaging of the chest was performed following the standard protocol without IV contrast. COMPARISON:  09/18/2019 FINDINGS: Cardiovascular: Heart is normal in size.  No pericardial effusion. No evidence of thoracic aortic aneurysm. Atherosclerotic calcifications of the aortic arch. Coronary atherosclerosis of the LAD and left circumflex. Mediastinum/Nodes: 7 mm short axis low right paratracheal node (series 2/image 58), unchanged. No suspicious axillary lymphadenopathy. Visualized thyroid is unremarkable. Lungs/Pleura: Postsurgical changes related to right upper lobe and left lower lobe wedge resections. 6 x 7 mm nodule in the medial right upper lobe (series 3/image 58), unchanged. 3 mm subpleural nodule in the anterior right middle lobe (series 3/image 92), unchanged. 5 x 7 mm pleural-based nodule at the left lung base (series 3/image 122), unchanged. Lingular scarring. Additional subpleural scarring in the lateral right lower lobe and at the left lung base. No focal consolidation. Moderate centrilobular and paraseptal emphysematous changes, upper lung predominant. Biapical pleural-parenchymal scarring. No pleural effusion or pneumothorax. Upper Abdomen: Visualized upper abdomen is notable for moderate left hydronephrosis, unchanged. Stable hepatic cysts. Vascular calcifications. Small hiatal hernia. Musculoskeletal: Degenerative changes of the visualized thoracolumbar spine. IMPRESSION: Postsurgical changes related to right upper lobe and  left lower lobe wedge resections. Stable small bilateral pulmonary nodules, as above. No findings suspicious for recurrent or metastatic disease. Aortic Atherosclerosis (ICD10-I70.0) and Emphysema (ICD10-J43.9). Electronically Signed   By: Julian Hy M.D.   On: 03/21/2020 16:52    Laboratory results were reviewed by me CBC Latest Ref Rng & Units 03/24/2020 03/21/2020 09/20/2019  WBC 4.0 - 10.5 K/uL 6.6 5.9 9.0  Hemoglobin 12.0 - 15.0 g/dL 14.9 16.2(H) 13.9  Hematocrit 36.0 - 46.0 % 44.9 47.8(H) 41.1  Platelets 150 - 400 K/uL 172 204 177   CMP Latest Ref Rng & Units  03/21/2020 09/20/2019 03/21/2019  Glucose 70 - 99 mg/dL 107(H) 101(H) 108(H)  BUN 8 - 23 mg/dL _0 Creatinine 0.44 - 1.00 mg/dL 0.78 0.88 0.75  Sodium 135 - 145 mmol/L 139 142 143  Potassium 3.5 - 5.1 mmol/L 4.0 3.8 4.7  Chloride 98 - 111 mmol/L 106 106 105  CO2 22 - 32 mmol/L _1 Calcium 8.9 - 10.3 mg/dL 9.5 9.2 9.6  Total Protein 6.5 - 8.1 g/dL 6.9 7.1 6.9  Total Bilirubin 0.3 - 1.2 mg/dL 0.6 0.5 0.6  Alkaline Phos 38 - 126 U/L 54 58 70  AST 15 - 41 U/L _2 ALT 0 - 44 U/L _3 Assessment:  TAIESHA BOVARD is a 85 y.o. 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 Pathology revealed a 1.1 cm high-grade adenocarcinoma with pleural invasion. Nodes were negative. Pathologic stage was T2aN1M0 (stage IB).  Heather [name] is s/p wedge resection of a left lower lobe mass on 03/10/2017.  Pathology revealed an 8 mm invasive adenocarcinoma with solid and acinar patterns.  There was no visceral invasion.  There was no lymphovascular invasion.  All margins were negative.  Pathologic stage was T1aNx (stage IA). 1. Primary cancer of right upper lobe of lung ([place])   2. Erythrocytosis   3. Centrilobular emphysema ([facility])    #History of stage Ib right lung upper lobe adenocarcinoma status post wedge resection in 2016 History of left lower lobe adenocarcinoma T1 a NX  status post wedge resection 2019. Patient is doing well clinically CT without contrast was independently reviewed by me and discussed with patient and husband. Stable exam with no new or progressive disease Because of the fact that Heather [name] did not get lumpectomy, and high risk of disease recurrence.  I recommend repeat CT scan in 6 months.  Intermittent erythrocytosis.  Check erythropoietin level, carbon monoxide level, JAK2 mutations with reflex.  BCR ABL.  #Centrilobular emphysema, continue singular and Advair inhalers.  RTC after CT scan in 6 months for MD assessment and labs (CBC with diff, CMP). Orders Placed This Encounter  Procedures  . CT CHEST WO CONTRAST    Standing Status:   Future    Standing Expiration Date:   03/24/2021    Order Specific Question:   Preferred imaging location?    Answer:   [facility]    Order Specific Question:   Radiology Contrast Protocol - do NOT remove file path    Answer:   [email or web address]  . CBC with Differential/Platelet    Standing Status:   Future    Standing Expiration Date:   03/24/2021  . JAK2 V617F, w Reflex to CALR/E12/MPL    Standing Status:   Future    Number of Occurrences:   1    Standing Expiration Date:   03/24/2021  . BCR-ABL1 FISH    Standing Status:   Future    Number of Occurrences:   1    Standing Expiration Date:   03/24/2021  . Carbon monoxide, blood (performed at ref lab)    Standing Status:   Future    Number of Occurrences:   1    Standing Expiration Date:   03/24/2021  . Erythropoietin    Standing Status:   Future    Number of Occurrences:   1    Standing Expiration Date:   03/24/2021  . CBC with Differential/Platelet    Standing  Status:   Future    Number of Occurrences:   1    Standing Expiration Date:   03/24/2021  . Comprehensive metabolic panel    Standing Status:   Future    Standing Expiration Date:   03/24/2021   We spent sufficient time to discuss many aspect of  care, questions were answered to patient's satisfaction.   Earlie Server, MD, PhD Hematology Oncology [facility] at [facility] Pager- [phone] 03/24/2020

## 2020-03-24 NOTE — Progress Notes (Signed)
Pt here for follow up. No new concerns voiced.   

## 2020-03-25 LAB — CARBON MONOXIDE, BLOOD (PERFORMED AT REF LAB): Carbon Monoxide, Blood: 2.6 % (ref 0.0–3.6)

## 2020-03-25 LAB — ERYTHROPOIETIN: Erythropoietin: 12.3 m[IU]/mL (ref 2.6–18.5)

## 2020-03-27 LAB — BCR-ABL1 FISH
Cells Analyzed: 200
Cells Counted: 200

## 2020-04-02 ENCOUNTER — Telehealth: Payer: Self-pay | Admitting: *Deleted

## 2020-04-02 NOTE — Telephone Encounter (Signed)
Patient would like a call back with her results.

## 2020-04-02 NOTE — Telephone Encounter (Signed)
labs drawn on 2/28 and JAK2 still pending at this time.  Anthing we can let her know about results that are available.

## 2020-04-02 NOTE — Telephone Encounter (Signed)
Patient notified that some labs still pending and MD will be contacting her when received.

## 2020-04-10 LAB — CALR + JAK2 E12-15 + MPL (REFLEXED)

## 2020-04-10 LAB — JAK2 V617F, W REFLEX TO CALR/E12/MPL

## 2020-04-10 NOTE — Telephone Encounter (Signed)
All labs are resulted. Her hemoglobin has normalized.  Additional blood tests are also good. No additional work up needed at this point. Same follow up plan

## 2020-04-11 NOTE — Telephone Encounter (Signed)
Patient notified

## 2020-09-11 ENCOUNTER — Ambulatory Visit
Admission: RE | Admit: 2020-09-11 | Discharge: 2020-09-11 | Disposition: A | Discharge status: Home / Self Care | Payer: Medicare HMO | Source: Ambulatory Visit | Attending: Oncology | Admitting: Oncology

## 2020-09-11 DIAGNOSIS — C3411 Malignant neoplasm of upper lobe, right bronchus or lung: Secondary | ICD-10-CM

## 2020-09-15 ENCOUNTER — Inpatient Hospital Stay: Payer: Medicare HMO | Admitting: Oncology

## 2020-09-15 ENCOUNTER — Inpatient Hospital Stay: Payer: Medicare HMO | Attending: Oncology

## 2020-09-15 ENCOUNTER — Encounter: Payer: Self-pay | Admitting: Oncology

## 2020-09-15 ENCOUNTER — Other Ambulatory Visit: Payer: Self-pay | Admitting: Oncology

## 2020-09-15 VITALS — BP 154/64 | HR 62 | Temp 96.7°F | Resp 14 | Wt 141.3 lb

## 2020-09-15 DIAGNOSIS — J432 Centrilobular emphysema: Secondary | ICD-10-CM

## 2020-09-15 DIAGNOSIS — C3411 Malignant neoplasm of upper lobe, right bronchus or lung: Secondary | ICD-10-CM

## 2020-09-15 DIAGNOSIS — D751 Secondary polycythemia: Secondary | ICD-10-CM | POA: Insufficient documentation

## 2020-09-15 DIAGNOSIS — Z79899 Other long term (current) drug therapy: Secondary | ICD-10-CM | POA: Diagnosis not present

## 2020-09-15 DIAGNOSIS — Z87891 Personal history of nicotine dependence: Secondary | ICD-10-CM | POA: Diagnosis not present

## 2020-09-15 DIAGNOSIS — C3431 Malignant neoplasm of lower lobe, right bronchus or lung: Secondary | ICD-10-CM | POA: Diagnosis present

## 2020-09-15 DIAGNOSIS — K219 Gastro-esophageal reflux disease without esophagitis: Secondary | ICD-10-CM | POA: Diagnosis not present

## 2020-09-15 DIAGNOSIS — N1330 Unspecified hydronephrosis: Secondary | ICD-10-CM | POA: Diagnosis not present

## 2020-09-15 DIAGNOSIS — K449 Diaphragmatic hernia without obstruction or gangrene: Secondary | ICD-10-CM | POA: Diagnosis not present

## 2020-09-15 DIAGNOSIS — Z86010 Personal history of colonic polyps: Secondary | ICD-10-CM | POA: Diagnosis not present

## 2020-09-15 DIAGNOSIS — E785 Hyperlipidemia, unspecified: Secondary | ICD-10-CM | POA: Diagnosis not present

## 2020-09-15 DIAGNOSIS — Z85118 Personal history of other malignant neoplasm of bronchus and lung: Secondary | ICD-10-CM | POA: Diagnosis not present

## 2020-09-15 DIAGNOSIS — I10 Essential (primary) hypertension: Secondary | ICD-10-CM | POA: Diagnosis not present

## 2020-09-15 LAB — COMPREHENSIVE METABOLIC PANEL
ALT: 19 U/L (ref 0–44)
AST: 27 U/L (ref 15–41)
Albumin: 4.3 g/dL (ref 3.5–5.0)
Alkaline Phosphatase: 59 U/L (ref 38–126)
Anion gap: 8 (ref 5–15)
BUN: 15 mg/dL (ref 8–23)
CO2: 28 mmol/L (ref 22–32)
Calcium: 9.2 mg/dL (ref 8.9–10.3)
Chloride: 105 mmol/L (ref 98–111)
Creatinine, Ser: 0.77 mg/dL (ref 0.44–1.00)
GFR, Estimated: >60 mL/min (ref >60)
Glucose, Bld: 111 mg/dL — ABNORMAL HIGH (ref 70–99)
Potassium: 3.9 mmol/L (ref 3.5–5.1)
Sodium: 141 mmol/L (ref 135–145)
Total Bilirubin: 0.6 mg/dL (ref 0.3–1.2)
Total Protein: 7 g/dL (ref 6.5–8.1)

## 2020-09-15 LAB — CBC WITH DIFFERENTIAL/PLATELET
Abs Immature Granulocytes: 0.01 10*3/uL (ref 0.00–0.07)
Basophils Absolute: 0 10*3/uL (ref 0.0–0.1)
Basophils Relative: 1 %
Eosinophils Absolute: 0.2 10*3/uL (ref 0.0–0.5)
Eosinophils Relative: 3 %
HCT: 47.3 % — ABNORMAL HIGH (ref 36.0–46.0)
Hemoglobin: 15.4 g/dL — ABNORMAL HIGH (ref 12.0–15.0)
Immature Granulocytes: 0 %
Lymphocytes Relative: 32 %
Lymphs Abs: 1.9 10*3/uL (ref 0.7–4.0)
MCH: 32.2 pg (ref 26.0–34.0)
MCHC: 32.6 g/dL (ref 30.0–36.0)
MCV: 99 fL (ref 80.0–100.0)
Monocytes Absolute: 0.5 10*3/uL (ref 0.1–1.0)
Monocytes Relative: 8 %
Neutro Abs: 3.3 10*3/uL (ref 1.7–7.7)
Neutrophils Relative %: 56 %
Platelets: 153 10*3/uL (ref 150–400)
RBC: 4.78 MIL/uL (ref 3.87–5.11)
RDW: 12.6 % (ref 11.5–15.5)
WBC: 5.8 10*3/uL (ref 4.0–10.5)
nRBC: 0 % (ref 0.0–0.2)

## 2020-09-15 NOTE — Progress Notes (Signed)
[facility] day:  09/15/2020    Chief Complaint: Heather [name] is a 85 y.o. 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  PERTINENT ONCOLOGY HISTORY Previously follows up with Dr.Corcoran. [place] care with me on 03/15/2018  PET scan on 02/13/2014 revealed a 1 cm right upper lobe hypermetabolic nodule (SUV 3.8) and a 6 mm right upper lobe nodule (no metabolic activity). There was no mediastianl adenopathy.  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 Pathology revealed a 1.1 cm high-grade adenocarcinoma with pleural invasion. Nodes were negative. Pathologic stage was T2aN1M0 (stage IB).  Chest CT on 02/10/2017 revealed clear interval progression of the posterior left lower lobe pulmonary nodule, now measuring 12 mm in long axis and having a distinctly lobular contour. Imaging features were very concerning for neoplasm.  There was no change in the 7 mm nodule posterior to the right hilum.  PET scan on 02/18/2017 revealed a 11 x 8 mm posterior left lower lobe pulmonary nodule that had mildly progressed from prior studies and demonstrated mild hypermetabolism (SUV 1.6).  The appearance was considered worrisome for an indolent primary bronchogenic neoplasm.  03/10/2017.  s/p wedge resection of a left lower lobe mass.   Pathology revealed an 8 mm invasive adenocarcinoma with solid and acinar patterns.  There was no visceral invasion.  There was no lymphovascular invasion.  All margins were negative.  Pathologic stage was T1aNx (stage IA).   Chest CT on 09/07/2017 revealed stable postsurgical change in the RIGHT upper lobe and LEFT lower lobe without evidence local recurrence.  There was a stable 8 mm nodule in the central RIGHT upper lobe.  There was upper lobe  centrilobular emphysema.  CEA has been followed: 4.7 on 10/01/2014, 4.5 on 07/31/2015, 3.9 on 05/03/2016, and 3.9 on 02/11/2017.  Bilateral mammogram on 07/22/2016 revealed no evidence of malignancy.   INTERVAL HISTORY Heather [name] is a 85 y.o. female who has above history reviewed by me today presents for follow up visit for management of history of lung cancer, 6 months assessment.  Problems and complaints are listed below: Patient had a CT scan done and present to discuss results.  No new complaints. Denies more shortness of breath than her baseline.     Past Medical History:  Diagnosis Date   Brain aneurysm    Cancer [facility]) lung   2016   Carotid artery stenosis    Carotid stenosis 06/19/2013   Overview:  Normal study, 1/16   Colonic polyp    GERD (gastroesophageal reflux disease)    Hiatal hernia    Hypercholesteremia    Hyperlipidemia, mixed 06/09/2015   Hypertension    Lung nodule 06/21/2014   Macrocytic 10/06/2014   Medicare annual wellness visit, initial 07/08/2016   Overview:  6/18   PAT (paroxysmal atrial tachycardia) ([place]) 06/19/2013   Plantar fasciitis    Tachycardia, paroxysmal ([place]) 03/31/14    Past Surgical History:  Procedure Laterality Date   BREAST EXCISIONAL BIOPSY Right 40 yrs ago   neg   BREAST LUMPECTOMY     benign   CATARACT EXTRACTION W/ INTRAOCULAR LENS  IMPLANT, BILATERAL Bilateral    CEREBRAL ANEURYSM REPAIR  2004   CERVIX LESION DESTRUCTION     COLONOSCOPY WITH PROPOFOL N/A 03/31/2015   Procedure: COLONOSCOPY WITH  PROPOFOL;  Surgeon: Manya Silvas, MD;  Location: [facility] ENDOSCOPY;  Service: Endoscopy;  Laterality: N/A;   FLEXIBLE BRONCHOSCOPY N/A 03/10/2017   Procedure: FLEXIBLE BRONCHOSCOPY;  Surgeon: Nestor Lewandowsky, MD;  Location: [facility] ORS;  Service: Thoracic;  Laterality: N/A;   THORACOTOMY/LOBECTOMY Left 03/10/2017   Procedure: THORACOTOMY WITH LUNG WEDGE RESECTION POSSIBLE LOBECTOMY;  Surgeon: Nestor Lewandowsky, MD;  Location: [facility] ORS;  Service:  Thoracic;  Laterality: Left;   TUBAL LIGATION      Family History  Problem Relation Age of Onset   Alcohol abuse Mother    Heart attack Father    Breast cancer Neg Hx     Social History:  reports that she quit smoking about 32 years ago. She has never used smokeless tobacco. She reports current alcohol use. She reports that she does not use drugs.   Allergies:  Allergies  Allergen Reactions   Ace Inhibitors Swelling   Contrast Media [Iodinated Diagnostic Agents]    Cortisone     Patient had facial swelling and itching from cortisone injection IM    Current Medications: Current Outpatient Medications  Medication Sig Dispense Refill   amLODipine (NORVASC) 5 MG tablet Take by mouth.     atenolol (TENORMIN) 50 MG tablet Take 50 mg by mouth at bedtime.     calcium-vitamin D (OSCAL WITH D) 500-200 MG-UNIT tablet Take 1 tablet by mouth.     cetirizine (ZYRTEC) 10 MG tablet Take 10 mg by mouth daily.     Cholecalciferol 1000 UNITS tablet Take 1,000 Units by mouth daily.     fluticasone (FLONASE) 50 MCG/ACT nasal spray Place 1 spray into the nose daily as needed for allergies.      Fluticasone-Salmeterol (ADVAIR) 100-50 MCG/DOSE AEPB Inhale into the lungs.     hydrochlorothiazide (HYDRODIURIL) 25 MG tablet Take 25 mg by mouth daily as needed.     simvastatin (ZOCOR) 20 MG tablet Take 20 mg by mouth daily.     vitamin B-12 (CYANOCOBALAMIN) 100 MCG tablet Take 100 mcg by mouth daily.     ALPRAZolam (XANAX) 0.25 MG tablet Take 0.25 mg by mouth 2 (two) times daily as needed for anxiety.  (Patient not taking: Reported on 09/15/2020)     amoxicillin (AMOXIL) 500 MG capsule  (Patient not taking: No sig reported)     Coenzyme Q10 (CO Q 10) 100 MG CAPS Take 1 capsule by mouth daily. (Patient not taking: Reported on 09/15/2020)     donepezil (ARICEPT) 5 MG tablet Take 5 mg by mouth at bedtime.  (Patient not taking: Reported on 09/15/2020)     oxyCODONE-acetaminophen (PERCOCET/ROXICET) 5-325 MG tablet  Take 1-2 tablets by mouth every 4 (four) hours as needed for moderate pain. (Patient not taking: Reported on 09/15/2020) 30 tablet 0   No current facility-administered medications for this visit.   Facility-Administered Medications Ordered in Other Visits  Medication Dose Route Frequency Provider Last Rate Last Admin   0.9 %  sodium chloride infusion   Intravenous Continuous Manya Silvas, MD       Review of Systems  Constitutional:  Negative for appetite change, chills, fatigue and fever.  HENT:   Negative for hearing loss and voice change.   Eyes:  Negative for eye problems.  Respiratory:  Negative for chest tightness and cough.   Cardiovascular:  Negative for chest pain.  Gastrointestinal:  Negative for abdominal distention, abdominal pain and blood in stool.  Endocrine: Negative for hot flashes.  Genitourinary:  Negative for difficulty urinating  and frequency.   Musculoskeletal:  Negative for arthralgias.       Chronic intermittent back pain  Skin:  Negative for itching and rash.  Neurological:  Negative for extremity weakness.  Hematological:  Negative for adenopathy.  Psychiatric/Behavioral:  Negative for confusion.    Performance status (ECOG): 0 - Asymptomatic  Vital Signs BP (!) 154/64   Pulse 62   Temp (!) 96.7 F (35.9 C)   Resp 14   Wt 141 lb 4.8 oz (64.1 kg)   SpO2 97%   BMI 25.84 kg/m   Physical Exam Constitutional:      General: She is not in acute distress.    Appearance: She is not diaphoretic.  HENT:     Head: Normocephalic and atraumatic.     Mouth/Throat:     Pharynx: No oropharyngeal exudate.  Eyes:     General: No scleral icterus.    Pupils: Pupils are equal, round, and reactive to light.  Cardiovascular:     Rate and Rhythm: Normal rate and regular rhythm.     Heart sounds: No murmur heard. Pulmonary:     Effort: Pulmonary effort is normal. No respiratory distress.  Abdominal:     General: There is no distension.     Palpations: Abdomen  is soft.     Tenderness: There is no abdominal tenderness.  Musculoskeletal:        General: Normal range of motion.     Cervical back: Normal range of motion and neck supple.  Skin:    General: Skin is warm and dry.     Findings: No erythema.  Neurological:     Mental Status: She is alert and oriented to person, place, and time.  Psychiatric:        Mood and Affect: Affect normal.    RADIOGRAPHIC STUDIES: I have personally reviewed the radiological images as listed and agreed with the findings in the report. CT CHEST WO CONTRAST  Result Date: 09/12/2020 CLINICAL DATA:  f/u lung cancer EXAM: CT CHEST WITHOUT CONTRAST TECHNIQUE: Multidetector CT imaging of the chest was performed following the standard protocol without IV contrast. COMPARISON:  CT chest dated March 21, 2020 FINDINGS: Cardiovascular: Normal heart size. No pericardial effusion. Calcifications of the LAD and RCA. Aortic atherosclerotic disease. Mediastinum/Nodes: Small hiatal hernia. Normal thyroid. No pathologically enlarged lymph nodes in the chest. Lungs/Pleura: Unchanged focal right pleural thickening seen on series 2, image 96. Central airways are patent. Upper lobe predominant centrilobular emphysema. Prior right upper lobe and left lower lobe wedge resections. Solid nodule of the right upper lobe measuring up to 7 mm on series 4, image 60 unchanged compared to prior exams. Ground-glass nodular opacity of the left upper lobe on image 35 is unchanged compared to multiple prior exams. No new or enlarging pulmonary nodules. Upper Abdomen: Scattered low-density liver lesions, unchanged compared to multiple priors. Unchanged left hydronephrosis. No acute findings. Musculoskeletal: No chest wall mass or suspicious bone lesions identified. IMPRESSION: Stable postsurgical changes of right upper lobe and left lower lobe wedge resections with no evidence of recurrence or metastatic disease. Aortic Atherosclerosis (ICD10-I70.0) and  Emphysema (ICD10-J43.9). Electronically Signed   By: Yetta Glassman M.D.   On: 09/12/2020 10:51     Laboratory results were reviewed by me CBC Latest Ref Rng & Units 09/15/2020 03/24/2020 03/21/2020  WBC 4.0 - 10.5 K/uL 5.8 6.6 5.9  Hemoglobin 12.0 - 15.0 g/dL 15.4(H) 14.9 16.2(H)  Hematocrit 36.0 - 46.0 % 47.3(H) 44.9 47.8(H)  Platelets 150 -  400 K/uL 153 172 204   CMP Latest Ref Rng & Units 09/15/2020 03/21/2020 09/20/2019  Glucose 70 - 99 mg/dL 111(H) 107(H) 101(H)  BUN 8 - 23 mg/dL _0 Creatinine 0.44 - 1.00 mg/dL 0.77 0.78 0.88  Sodium 135 - 145 mmol/L 141 139 142  Potassium 3.5 - 5.1 mmol/L 3.9 4.0 3.8  Chloride 98 - 111 mmol/L 105 106 106  CO2 22 - 32 mmol/L _1 Calcium 8.9 - 10.3 mg/dL 9.2 9.5 9.2  Total Protein 6.5 - 8.1 g/dL 7.0 6.9 7.1  Total Bilirubin 0.3 - 1.2 mg/dL 0.6 0.6 0.5  Alkaline Phos 38 - 126 U/L 59 54 58  AST 15 - 41 U/L _2 ALT 0 - 44 U/L _3 Assessment:  Heather [name] is a 85 y.o. 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 Pathology revealed a 1.1 cm high-grade adenocarcinoma with pleural invasion. Nodes were negative. Pathologic stage was T2aN1M0 (stage IB).  She is s/p wedge resection of a left lower lobe mass on 03/10/2017.  Pathology revealed an 8 mm invasive adenocarcinoma with solid and acinar patterns.  There was no visceral invasion.  There was no lymphovascular invasion.  All margins were negative.  Pathologic stage was T1aNx (stage IA).   1. Primary cancer of right upper lobe of lung ([place])   2. Erythrocytosis    #History of stage Ib right lung upper lobe adenocarcinoma status post wedge resection in 2016 History of left lower lobe adenocarcinoma T1 a NX status post wedge resection 2019. Patient is doing well clinically August 2022 CT scan was independently reviewed by me and discussed with patient.  No signs of recurrence or metastatic disease Repeat CT in 6  months.  After that, she will be 3 years after her surgery and will increase the CT intervals to annually  Intermittent erythrocytosis.  JAK2 V617F mutation negative, with reflex to other mutations CALR, MPL, JAK 2 Ex 12-15 mutations negative. Negative BCR ABL. Most likely this is secondary erythrocytosis.  I suspect that this may be secondary to hypoxia due to underlying chronic lung diseases or sleep apnea.  I encourage patient to further discuss with her primary care provider Dr. [name] Heck.  #Centrilobular emphysema, continue singular and Advair inhalers.  RTC after CT scan in 6 months for MD assessment and labs (CBC with diff, CMP). Orders Placed This Encounter  Procedures   CT CHEST WO CONTRAST    Standing Status:   Future    Standing Expiration Date:   09/15/2021    Order Specific Question:   Preferred imaging location?    Answer:   [facility]    Order Specific Question:   Radiology Contrast Protocol - do NOT remove file path    Answer:   [email or web address]   CBC with Differential/Platelet    Standing Status:   Future    Standing Expiration Date:   09/15/2021   Comprehensive metabolic panel    Standing Status:   Future    Standing Expiration Date:   09/15/2021   We spent sufficient time to discuss many aspect of care, questions were answered to patient's satisfaction.   Earlie Server, MD, PhD Hematology Oncology [facility] Pager- [phone] 09/15/2020

## 2020-11-24 ENCOUNTER — Other Ambulatory Visit: Payer: Self-pay

## 2020-11-24 ENCOUNTER — Emergency Department: Payer: Medicare HMO

## 2020-11-24 DIAGNOSIS — A4151 Sepsis due to Escherichia coli [E. coli]: Secondary | ICD-10-CM | POA: Diagnosis not present

## 2020-11-24 DIAGNOSIS — Z91041 Radiographic dye allergy status: Secondary | ICD-10-CM

## 2020-11-24 DIAGNOSIS — Z8249 Family history of ischemic heart disease and other diseases of the circulatory system: Secondary | ICD-10-CM

## 2020-11-24 DIAGNOSIS — I10 Essential (primary) hypertension: Secondary | ICD-10-CM | POA: Diagnosis present

## 2020-11-24 DIAGNOSIS — R0902 Hypoxemia: Secondary | ICD-10-CM | POA: Diagnosis not present

## 2020-11-24 DIAGNOSIS — N136 Pyonephrosis: Secondary | ICD-10-CM | POA: Diagnosis present

## 2020-11-24 DIAGNOSIS — Z8719 Personal history of other diseases of the digestive system: Secondary | ICD-10-CM

## 2020-11-24 DIAGNOSIS — J431 Panlobular emphysema: Secondary | ICD-10-CM | POA: Diagnosis present

## 2020-11-24 DIAGNOSIS — K219 Gastro-esophageal reflux disease without esophagitis: Secondary | ICD-10-CM | POA: Diagnosis present

## 2020-11-24 DIAGNOSIS — I671 Cerebral aneurysm, nonruptured: Secondary | ICD-10-CM | POA: Diagnosis present

## 2020-11-24 DIAGNOSIS — I471 Supraventricular tachycardia: Secondary | ICD-10-CM | POA: Diagnosis present

## 2020-11-24 DIAGNOSIS — Z87891 Personal history of nicotine dependence: Secondary | ICD-10-CM

## 2020-11-24 DIAGNOSIS — I480 Paroxysmal atrial fibrillation: Secondary | ICD-10-CM | POA: Diagnosis present

## 2020-11-24 DIAGNOSIS — I700 Atherosclerosis of aorta: Secondary | ICD-10-CM | POA: Diagnosis present

## 2020-11-24 DIAGNOSIS — Z811 Family history of alcohol abuse and dependence: Secondary | ICD-10-CM

## 2020-11-24 DIAGNOSIS — Z888 Allergy status to other drugs, medicaments and biological substances status: Secondary | ICD-10-CM

## 2020-11-24 DIAGNOSIS — R112 Nausea with vomiting, unspecified: Secondary | ICD-10-CM | POA: Diagnosis present

## 2020-11-24 DIAGNOSIS — Z823 Family history of stroke: Secondary | ICD-10-CM

## 2020-11-24 DIAGNOSIS — Z79899 Other long term (current) drug therapy: Secondary | ICD-10-CM

## 2020-11-24 DIAGNOSIS — Z20822 Contact with and (suspected) exposure to covid-19: Secondary | ICD-10-CM | POA: Diagnosis present

## 2020-11-24 DIAGNOSIS — R6521 Severe sepsis with septic shock: Secondary | ICD-10-CM | POA: Diagnosis not present

## 2020-11-24 DIAGNOSIS — E782 Mixed hyperlipidemia: Secondary | ICD-10-CM | POA: Diagnosis present

## 2020-11-24 DIAGNOSIS — M545 Low back pain, unspecified: Secondary | ICD-10-CM | POA: Diagnosis present

## 2020-11-24 DIAGNOSIS — N179 Acute kidney failure, unspecified: Secondary | ICD-10-CM | POA: Diagnosis present

## 2020-11-24 DIAGNOSIS — K5730 Diverticulosis of large intestine without perforation or abscess without bleeding: Secondary | ICD-10-CM | POA: Diagnosis present

## 2020-11-24 DIAGNOSIS — E860 Dehydration: Secondary | ICD-10-CM | POA: Diagnosis present

## 2020-11-24 LAB — COMPREHENSIVE METABOLIC PANEL
ALT: 14 U/L (ref 0–44)
AST: 19 U/L (ref 15–41)
Albumin: 3.8 g/dL (ref 3.5–5.0)
Alkaline Phosphatase: 44 U/L (ref 38–126)
Anion gap: 12 (ref 5–15)
BUN: 23 mg/dL (ref 8–23)
CO2: 23 mmol/L (ref 22–32)
Calcium: 9.3 mg/dL (ref 8.9–10.3)
Chloride: 104 mmol/L (ref 98–111)
Creatinine, Ser: 1.2 mg/dL — ABNORMAL HIGH (ref 0.44–1.00)
GFR, Estimated: 45 mL/min — ABNORMAL LOW (ref >60)
Glucose, Bld: 138 mg/dL — ABNORMAL HIGH (ref 70–99)
Potassium: 3.8 mmol/L (ref 3.5–5.1)
Sodium: 139 mmol/L (ref 135–145)
Total Bilirubin: 0.7 mg/dL (ref 0.3–1.2)
Total Protein: 6.5 g/dL (ref 6.5–8.1)

## 2020-11-24 LAB — CBC
HCT: 42.1 % (ref 36.0–46.0)
Hemoglobin: 14.4 g/dL (ref 12.0–15.0)
MCH: 34.5 pg — ABNORMAL HIGH (ref 26.0–34.0)
MCHC: 34.2 g/dL (ref 30.0–36.0)
MCV: 101 fL — ABNORMAL HIGH (ref 80.0–100.0)
Platelets: 154 10*3/uL (ref 150–400)
RBC: 4.17 MIL/uL (ref 3.87–5.11)
RDW: 12.7 % (ref 11.5–15.5)
WBC: 13.1 10*3/uL — ABNORMAL HIGH (ref 4.0–10.5)
nRBC: 0 % (ref 0.0–0.2)

## 2020-11-24 LAB — LIPASE, BLOOD: Lipase: 35 U/L (ref 11–51)

## 2020-11-24 MED ORDER — ONDANSETRON 4 MG PO TBDP
4.0000 mg | ORAL_TABLET | Freq: Once | ORAL | Status: AC | PRN
  Administered 2020-11-24: 4 mg via ORAL
  Filled 2020-11-24: qty 1

## 2020-11-24 NOTE — ED Triage Notes (Signed)
Pt also c/o chills, during triage assessment, pt found to have oxygen saturation down to 85% on RA, pt placed on 4L [place] up to 94%.

## 2020-11-24 NOTE — ED Triage Notes (Signed)
Pt symptoms started bilat lower abd pain around noon today. Now complains more of L lower abd pain.  Has vomited at least twice.  Currently nausea.

## 2020-11-25 ENCOUNTER — Inpatient Hospital Stay
Admission: EM | Admit: 2020-11-25 | Discharge: 2020-11-28 | DRG: 853 | Disposition: A | Discharge status: Home / Self Care | Payer: Medicare HMO | Attending: Family Medicine | Admitting: Family Medicine

## 2020-11-25 ENCOUNTER — Inpatient Hospital Stay: Payer: Medicare HMO | Admitting: Anesthesiology

## 2020-11-25 ENCOUNTER — Inpatient Hospital Stay: Payer: Medicare HMO

## 2020-11-25 ENCOUNTER — Encounter: Payer: Self-pay | Admitting: Internal Medicine

## 2020-11-25 ENCOUNTER — Emergency Department: Payer: Medicare HMO

## 2020-11-25 ENCOUNTER — Encounter
Admission: EM | Disposition: A | Discharge status: Home / Self Care | Payer: Self-pay | Source: Home / Self Care | Attending: Family Medicine

## 2020-11-25 DIAGNOSIS — R6521 Severe sepsis with septic shock: Secondary | ICD-10-CM | POA: Diagnosis not present

## 2020-11-25 DIAGNOSIS — K219 Gastro-esophageal reflux disease without esophagitis: Secondary | ICD-10-CM | POA: Diagnosis present

## 2020-11-25 DIAGNOSIS — N10 Acute tubulo-interstitial nephritis: Secondary | ICD-10-CM | POA: Diagnosis not present

## 2020-11-25 DIAGNOSIS — Z91041 Radiographic dye allergy status: Secondary | ICD-10-CM | POA: Diagnosis not present

## 2020-11-25 DIAGNOSIS — Z888 Allergy status to other drugs, medicaments and biological substances status: Secondary | ICD-10-CM | POA: Diagnosis not present

## 2020-11-25 DIAGNOSIS — I10 Essential (primary) hypertension: Secondary | ICD-10-CM | POA: Diagnosis present

## 2020-11-25 DIAGNOSIS — I480 Paroxysmal atrial fibrillation: Secondary | ICD-10-CM | POA: Diagnosis present

## 2020-11-25 DIAGNOSIS — B9620 Unspecified Escherichia coli [E. coli] as the cause of diseases classified elsewhere: Secondary | ICD-10-CM | POA: Diagnosis not present

## 2020-11-25 DIAGNOSIS — N390 Urinary tract infection, site not specified: Secondary | ICD-10-CM | POA: Diagnosis present

## 2020-11-25 DIAGNOSIS — Z811 Family history of alcohol abuse and dependence: Secondary | ICD-10-CM | POA: Diagnosis not present

## 2020-11-25 DIAGNOSIS — J431 Panlobular emphysema: Secondary | ICD-10-CM | POA: Diagnosis present

## 2020-11-25 DIAGNOSIS — N134 Hydroureter: Secondary | ICD-10-CM

## 2020-11-25 DIAGNOSIS — N179 Acute kidney failure, unspecified: Secondary | ICD-10-CM

## 2020-11-25 DIAGNOSIS — Z8719 Personal history of other diseases of the digestive system: Secondary | ICD-10-CM | POA: Diagnosis not present

## 2020-11-25 DIAGNOSIS — J449 Chronic obstructive pulmonary disease, unspecified: Secondary | ICD-10-CM

## 2020-11-25 DIAGNOSIS — N1330 Unspecified hydronephrosis: Secondary | ICD-10-CM | POA: Diagnosis present

## 2020-11-25 DIAGNOSIS — A4151 Sepsis due to Escherichia coli [E. coli]: Secondary | ICD-10-CM | POA: Diagnosis present

## 2020-11-25 DIAGNOSIS — R7881 Bacteremia: Secondary | ICD-10-CM | POA: Diagnosis not present

## 2020-11-25 DIAGNOSIS — N12 Tubulo-interstitial nephritis, not specified as acute or chronic: Secondary | ICD-10-CM

## 2020-11-25 DIAGNOSIS — R0902 Hypoxemia: Secondary | ICD-10-CM | POA: Diagnosis present

## 2020-11-25 DIAGNOSIS — R0602 Shortness of breath: Secondary | ICD-10-CM

## 2020-11-25 DIAGNOSIS — K5730 Diverticulosis of large intestine without perforation or abscess without bleeding: Secondary | ICD-10-CM | POA: Diagnosis present

## 2020-11-25 DIAGNOSIS — M545 Low back pain, unspecified: Secondary | ICD-10-CM | POA: Diagnosis present

## 2020-11-25 DIAGNOSIS — Z20822 Contact with and (suspected) exposure to covid-19: Secondary | ICD-10-CM | POA: Diagnosis present

## 2020-11-25 DIAGNOSIS — I471 Supraventricular tachycardia: Secondary | ICD-10-CM | POA: Diagnosis present

## 2020-11-25 DIAGNOSIS — I700 Atherosclerosis of aorta: Secondary | ICD-10-CM | POA: Diagnosis present

## 2020-11-25 DIAGNOSIS — Z8249 Family history of ischemic heart disease and other diseases of the circulatory system: Secondary | ICD-10-CM | POA: Diagnosis not present

## 2020-11-25 DIAGNOSIS — Z87891 Personal history of nicotine dependence: Secondary | ICD-10-CM | POA: Diagnosis not present

## 2020-11-25 DIAGNOSIS — N136 Pyonephrosis: Secondary | ICD-10-CM | POA: Diagnosis present

## 2020-11-25 DIAGNOSIS — A419 Sepsis, unspecified organism: Secondary | ICD-10-CM | POA: Diagnosis not present

## 2020-11-25 DIAGNOSIS — E782 Mixed hyperlipidemia: Secondary | ICD-10-CM | POA: Diagnosis present

## 2020-11-25 DIAGNOSIS — E860 Dehydration: Secondary | ICD-10-CM | POA: Diagnosis present

## 2020-11-25 DIAGNOSIS — I671 Cerebral aneurysm, nonruptured: Secondary | ICD-10-CM | POA: Diagnosis present

## 2020-11-25 DIAGNOSIS — Z823 Family history of stroke: Secondary | ICD-10-CM | POA: Diagnosis not present

## 2020-11-25 HISTORY — PX: CYSTOSCOPY WITH STENT PLACEMENT: SHX5790

## 2020-11-25 LAB — CBC WITH DIFFERENTIAL/PLATELET
Abs Immature Granulocytes: 0.13 10*3/uL — ABNORMAL HIGH (ref 0.00–0.07)
Basophils Absolute: 0 10*3/uL (ref 0.0–0.1)
Basophils Relative: 0 %
Eosinophils Absolute: 0 10*3/uL (ref 0.0–0.5)
Eosinophils Relative: 0 %
HCT: 35.1 % — ABNORMAL LOW (ref 36.0–46.0)
Hemoglobin: 11.6 g/dL — ABNORMAL LOW (ref 12.0–15.0)
Immature Granulocytes: 1 %
Lymphocytes Relative: 7 %
Lymphs Abs: 0.8 10*3/uL (ref 0.7–4.0)
MCH: 33.9 pg (ref 26.0–34.0)
MCHC: 33 g/dL (ref 30.0–36.0)
MCV: 102.6 fL — ABNORMAL HIGH (ref 80.0–100.0)
Monocytes Absolute: 0.5 10*3/uL (ref 0.1–1.0)
Monocytes Relative: 5 %
Neutro Abs: 9.8 10*3/uL — ABNORMAL HIGH (ref 1.7–7.7)
Neutrophils Relative %: 87 %
Platelets: 110 10*3/uL — ABNORMAL LOW (ref 150–400)
RBC: 3.42 MIL/uL — ABNORMAL LOW (ref 3.87–5.11)
RDW: 13.2 % (ref 11.5–15.5)
Smear Review: NORMAL
WBC: 11.3 10*3/uL — ABNORMAL HIGH (ref 4.0–10.5)
nRBC: 0 % (ref 0.0–0.2)

## 2020-11-25 LAB — RESP PANEL BY RT-PCR (FLU A&B, COVID) ARPGX2
Influenza A by PCR: NEGATIVE
Influenza B by PCR: NEGATIVE
SARS Coronavirus 2 by RT PCR: NEGATIVE

## 2020-11-25 LAB — URINALYSIS, ROUTINE W REFLEX MICROSCOPIC
Bacteria, UA: NONE SEEN
Bilirubin Urine: NEGATIVE
Glucose, UA: NEGATIVE mg/dL
Ketones, ur: 20 mg/dL — AB
Nitrite: POSITIVE — AB
Protein, ur: 100 mg/dL — AB
Specific Gravity, Urine: 1.02 (ref 1.005–1.030)
Squamous Epithelial / HPF: NONE SEEN (ref 0–5)
WBC, UA: >50 WBC/hpf — ABNORMAL HIGH (ref 0–5)
pH: 5 (ref 5.0–8.0)

## 2020-11-25 LAB — BLOOD CULTURE ID PANEL (REFLEXED) - BCID2

## 2020-11-25 LAB — COMPREHENSIVE METABOLIC PANEL
ALT: 13 U/L (ref 0–44)
AST: 28 U/L (ref 15–41)
Albumin: 2.9 g/dL — ABNORMAL LOW (ref 3.5–5.0)
Alkaline Phosphatase: 44 U/L (ref 38–126)
Anion gap: 9 (ref 5–15)
BUN: 28 mg/dL — ABNORMAL HIGH (ref 8–23)
CO2: 24 mmol/L (ref 22–32)
Calcium: 7.9 mg/dL — ABNORMAL LOW (ref 8.9–10.3)
Chloride: 104 mmol/L (ref 98–111)
Creatinine, Ser: 2.22 mg/dL — ABNORMAL HIGH (ref 0.44–1.00)
GFR, Estimated: 21 mL/min — ABNORMAL LOW (ref >60)
Glucose, Bld: 110 mg/dL — ABNORMAL HIGH (ref 70–99)
Potassium: 3.2 mmol/L — ABNORMAL LOW (ref 3.5–5.1)
Sodium: 137 mmol/L (ref 135–145)
Total Bilirubin: 0.8 mg/dL (ref 0.3–1.2)
Total Protein: 5.1 g/dL — ABNORMAL LOW (ref 6.5–8.1)

## 2020-11-25 LAB — PROTIME-INR
INR: 1.7 — ABNORMAL HIGH (ref 0.8–1.2)
Prothrombin Time: 20.4 seconds — ABNORMAL HIGH (ref 11.4–15.2)

## 2020-11-25 LAB — TROPONIN I (HIGH SENSITIVITY)
Troponin I (High Sensitivity): 5 ng/L (ref <18)
Troponin I (High Sensitivity): 6 ng/L (ref <18)

## 2020-11-25 LAB — LACTIC ACID, PLASMA
Lactic Acid, Venous: 1.2 mmol/L (ref 0.5–1.9)
Lactic Acid, Venous: 4.7 mmol/L (ref 0.5–1.9)

## 2020-11-25 LAB — PROCALCITONIN
Procalcitonin: 2.21 ng/mL
Procalcitonin: 29.89 ng/mL

## 2020-11-25 LAB — APTT: aPTT: 42 seconds — ABNORMAL HIGH (ref 24–36)

## 2020-11-25 SURGERY — CYSTOSCOPY, WITH STENT INSERTION
Anesthesia: Monitor Anesthesia Care | Laterality: Left

## 2020-11-25 MED ORDER — ENOXAPARIN SODIUM 30 MG/0.3ML IJ SOSY
30.0000 mg | PREFILLED_SYRINGE | INTRAMUSCULAR | Status: DC
  Administered 2020-11-26 – 2020-11-27 (×2): 30 mg via SUBCUTANEOUS
  Filled 2020-11-25 (×2): qty 0.3

## 2020-11-25 MED ORDER — IOHEXOL 180 MG/ML  SOLN
INTRAMUSCULAR | Status: DC | PRN
  Administered 2020-11-25: 15 mL

## 2020-11-25 MED ORDER — MIDAZOLAM HCL 2 MG/2ML IJ SOLN
INTRAMUSCULAR | Status: AC
  Filled 2020-11-25: qty 2

## 2020-11-25 MED ORDER — FENTANYL CITRATE (PF) 100 MCG/2ML IJ SOLN
25.0000 ug | INTRAMUSCULAR | Status: DC | PRN
Start: 2020-11-25 — End: 2020-11-26

## 2020-11-25 MED ORDER — ONDANSETRON HCL 4 MG/2ML IJ SOLN
4.0000 mg | Freq: Four times a day (QID) | INTRAMUSCULAR | Status: DC | PRN
  Administered 2020-11-27: 4 mg via INTRAVENOUS
  Filled 2020-11-25: qty 2

## 2020-11-25 MED ORDER — PHENYLEPHRINE HCL-NACL 20-0.9 MG/250ML-% IV SOLN
INTRAVENOUS | Status: AC
  Filled 2020-11-25: qty 250

## 2020-11-25 MED ORDER — SODIUM CHLORIDE 0.9 % IV BOLUS
1000.0000 mL | Freq: Once | INTRAVENOUS | Status: AC
  Administered 2020-11-25: 1000 mL via INTRAVENOUS

## 2020-11-25 MED ORDER — LACTATED RINGERS IV SOLN: INTRAVENOUS | Status: DC

## 2020-11-25 MED ORDER — FENTANYL CITRATE (PF) 100 MCG/2ML IJ SOLN
INTRAMUSCULAR | Status: DC | PRN
  Administered 2020-11-25: 25 ug via INTRAVENOUS
  Administered 2020-11-25: 50 ug via INTRAVENOUS
  Administered 2020-11-25: 25 ug via INTRAVENOUS

## 2020-11-25 MED ORDER — PANTOPRAZOLE SODIUM 40 MG IV SOLR
40.0000 mg | INTRAVENOUS | Status: DC
  Administered 2020-11-25 – 2020-11-28 (×4): 40 mg via INTRAVENOUS
  Filled 2020-11-25 (×4): qty 40

## 2020-11-25 MED ORDER — ONDANSETRON HCL 4 MG/2ML IJ SOLN
4.0000 mg | Freq: Once | INTRAMUSCULAR | Status: AC
  Administered 2020-11-25: 4 mg via INTRAVENOUS
  Filled 2020-11-25: qty 2

## 2020-11-25 MED ORDER — OYSTER SHELL CALCIUM/D 500-5 MG-MCG PO TABS
1.0000 | ORAL_TABLET | Freq: Every day | ORAL | Status: DC
  Administered 2020-11-26 – 2020-11-28 (×3): 1 via ORAL
  Filled 2020-11-25 (×7): qty 1

## 2020-11-25 MED ORDER — ATENOLOL 50 MG PO TABS
50.0000 mg | ORAL_TABLET | Freq: Every day | ORAL | Status: DC
  Administered 2020-11-26 – 2020-11-27 (×2): 50 mg via ORAL
  Filled 2020-11-25 (×3): qty 1

## 2020-11-25 MED ORDER — OXYCODONE HCL 5 MG/5ML PO SOLN: 5.0000 mg | Freq: Once | ORAL | Status: DC | PRN

## 2020-11-25 MED ORDER — SIMVASTATIN 20 MG PO TABS
20.0000 mg | ORAL_TABLET | Freq: Every day | ORAL | Status: DC
  Administered 2020-11-25 – 2020-11-28 (×4): 20 mg via ORAL
  Filled 2020-11-25 (×3): qty 1
  Filled 2020-11-25: qty 2

## 2020-11-25 MED ORDER — ENOXAPARIN SODIUM 40 MG/0.4ML IJ SOSY
40.0000 mg | PREFILLED_SYRINGE | INTRAMUSCULAR | Status: DC
  Administered 2020-11-25: 40 mg via SUBCUTANEOUS
  Filled 2020-11-25: qty 0.4

## 2020-11-25 MED ORDER — LACTATED RINGERS IV BOLUS
500.0000 mL | Freq: Once | INTRAVENOUS | Status: AC
  Administered 2020-11-25: 500 mL via INTRAVENOUS

## 2020-11-25 MED ORDER — IPRATROPIUM-ALBUTEROL 0.5-2.5 (3) MG/3ML IN SOLN
3.0000 mL | Freq: Once | RESPIRATORY_TRACT | Status: AC
  Administered 2020-11-25: 3 mL via RESPIRATORY_TRACT
  Filled 2020-11-25: qty 3

## 2020-11-25 MED ORDER — MORPHINE SULFATE (PF) 2 MG/ML IV SOLN
2.0000 mg | INTRAVENOUS | Status: DC | PRN
  Administered 2020-11-25: 2 mg via INTRAVENOUS
  Filled 2020-11-25: qty 1

## 2020-11-25 MED ORDER — SODIUM CHLORIDE 0.9 % IV SOLN: 2.0000 g | INTRAVENOUS | Status: DC

## 2020-11-25 MED ORDER — VITAMIN B-12 100 MCG PO TABS
100.0000 ug | ORAL_TABLET | Freq: Every day | ORAL | Status: DC
  Administered 2020-11-25 – 2020-11-27 (×3): 100 ug via ORAL
  Filled 2020-11-25 (×4): qty 1

## 2020-11-25 MED ORDER — FENTANYL CITRATE (PF) 100 MCG/2ML IJ SOLN
INTRAMUSCULAR | Status: AC
  Filled 2020-11-25: qty 2

## 2020-11-25 MED ORDER — LACTATED RINGERS IV BOLUS
1000.0000 mL | Freq: Once | INTRAVENOUS | Status: AC
  Administered 2020-11-25: 1000 mL via INTRAVENOUS

## 2020-11-25 MED ORDER — ONDANSETRON HCL 4 MG/2ML IJ SOLN: 4.0000 mg | Freq: Once | INTRAMUSCULAR | Status: DC | PRN

## 2020-11-25 MED ORDER — FLUCONAZOLE 50 MG PO TABS
150.0000 mg | ORAL_TABLET | Freq: Once | ORAL | Status: AC
  Administered 2020-11-25: 150 mg via ORAL
  Filled 2020-11-25: qty 1

## 2020-11-25 MED ORDER — MOMETASONE FURO-FORMOTEROL FUM 100-5 MCG/ACT IN AERO
2.0000 | INHALATION_SPRAY | Freq: Two times a day (BID) | RESPIRATORY_TRACT | Status: DC
  Administered 2020-11-25 – 2020-11-28 (×6): 2 via RESPIRATORY_TRACT
  Filled 2020-11-25: qty 8.8

## 2020-11-25 MED ORDER — OXYCODONE HCL 5 MG PO TABS: 5.0000 mg | ORAL_TABLET | Freq: Once | ORAL | Status: DC | PRN

## 2020-11-25 MED ORDER — SODIUM CHLORIDE 0.9 % IV SOLN
2.0000 g | INTRAVENOUS | Status: DC
  Administered 2020-11-25: 2 g via INTRAVENOUS
  Filled 2020-11-25: qty 2

## 2020-11-25 MED ORDER — SODIUM CHLORIDE 0.9 % IV SOLN: INTRAVENOUS | Status: DC

## 2020-11-25 MED ORDER — PHENYLEPHRINE HCL (PRESSORS) 10 MG/ML IV SOLN
INTRAVENOUS | Status: AC
  Filled 2020-11-25: qty 1

## 2020-11-25 MED ORDER — LORATADINE 10 MG PO TABS
10.0000 mg | ORAL_TABLET | Freq: Every day | ORAL | Status: DC
  Administered 2020-11-25 – 2020-11-28 (×4): 10 mg via ORAL
  Filled 2020-11-25 (×4): qty 1

## 2020-11-25 MED ORDER — SODIUM CHLORIDE 0.9 % IV SOLN
1.0000 g | Freq: Once | INTRAVENOUS | Status: AC
  Administered 2020-11-25: 1 g via INTRAVENOUS
  Filled 2020-11-25: qty 10

## 2020-11-25 MED ORDER — MORPHINE SULFATE (PF) 2 MG/ML IV SOLN
2.0000 mg | Freq: Once | INTRAVENOUS | Status: AC
Start: 2020-11-25 — End: 2020-11-25
  Administered 2020-11-25: 2 mg via INTRAVENOUS
  Filled 2020-11-25: qty 1

## 2020-11-25 MED ORDER — ACETAMINOPHEN 325 MG PO TABS
650.0000 mg | ORAL_TABLET | Freq: Four times a day (QID) | ORAL | Status: DC | PRN
  Administered 2020-11-25: 650 mg via ORAL
  Filled 2020-11-25: qty 2

## 2020-11-25 MED ORDER — IPRATROPIUM-ALBUTEROL 0.5-2.5 (3) MG/3ML IN SOLN: 3.0000 mL | Freq: Four times a day (QID) | RESPIRATORY_TRACT | Status: DC | PRN

## 2020-11-25 MED ORDER — VITAMIN D 25 MCG (1000 UNIT) PO TABS
1000.0000 [IU] | ORAL_TABLET | Freq: Every day | ORAL | Status: DC
  Administered 2020-11-25 – 2020-11-28 (×4): 1000 [IU] via ORAL
  Filled 2020-11-25 (×4): qty 1

## 2020-11-25 MED ORDER — FLUTICASONE PROPIONATE 50 MCG/ACT NA SUSP
1.0000 | Freq: Every day | NASAL | Status: DC
  Administered 2020-11-25 – 2020-11-28 (×4): 1 via NASAL
  Filled 2020-11-25: qty 16

## 2020-11-25 MED ORDER — SODIUM CHLORIDE 0.9 % IR SOLN
Status: DC | PRN
  Administered 2020-11-25: 1500 mL

## 2020-11-25 MED ORDER — ONDANSETRON HCL 4 MG/2ML IJ SOLN
INTRAMUSCULAR | Status: DC | PRN
  Administered 2020-11-25: 4 mg via INTRAVENOUS

## 2020-11-25 MED ORDER — SODIUM CHLORIDE 0.9 % IV SOLN: 1.0000 g | INTRAVENOUS | Status: DC

## 2020-11-25 MED ORDER — SODIUM CHLORIDE 0.9 % IV SOLN
2.0000 g | INTRAVENOUS | Status: DC
  Administered 2020-11-26 – 2020-11-28 (×3): 2 g via INTRAVENOUS
  Filled 2020-11-25: qty 2
  Filled 2020-11-25 (×2): qty 20

## 2020-11-25 MED ORDER — ONDANSETRON HCL 4 MG PO TABS: 4.0000 mg | ORAL_TABLET | Freq: Four times a day (QID) | ORAL | Status: DC | PRN

## 2020-11-25 MED ORDER — MIDAZOLAM HCL 2 MG/2ML IJ SOLN
INTRAMUSCULAR | Status: DC | PRN
  Administered 2020-11-25 (×4): .5 mg via INTRAVENOUS

## 2020-11-25 MED ORDER — LACTATED RINGERS IV SOLN: INTRAVENOUS | Status: AC

## 2020-11-25 SURGICAL SUPPLY — 28 items
BAG DRAIN CYSTO-URO LG1000N (MISCELLANEOUS) ×2 IMPLANT
BRUSH SCRUB EZ 1% IODOPHOR (MISCELLANEOUS) ×2 IMPLANT
CATH FOLEY 2WAY  5CC 16FR (CATHETERS) ×1
CATH URETL OPEN 5X70 (CATHETERS) ×2 IMPLANT
CATH URTH 16FR FL 2W BLN LF (CATHETERS) ×1 IMPLANT
CNTNR SPEC 2.5X3XGRAD LEK (MISCELLANEOUS) ×1
CONT SPEC 4OZ STER OR WHT (MISCELLANEOUS) ×1
CONTAINER SPEC 2.5X3XGRAD LEK (MISCELLANEOUS) ×1 IMPLANT
GAUZE 4X4 16PLY ~~LOC~~+RFID DBL (SPONGE) ×4 IMPLANT
GLOVE SURG UNDER POLY LF SZ7.5 (GLOVE) ×2 IMPLANT
GOWN STRL REUS W/ TWL XL LVL3 (GOWN DISPOSABLE) ×1 IMPLANT
GOWN STRL REUS W/TWL XL LVL3 (GOWN DISPOSABLE) ×1
GUIDEWIRE STR DUAL SENSOR (WIRE) ×2 IMPLANT
GUIDEWIRE STR ZIPWIRE 035X150 (MISCELLANEOUS) ×2 IMPLANT
HOLDER FOLEY CATH W/STRAP (MISCELLANEOUS) ×2 IMPLANT
IV NS IRRIG 3000ML ARTHROMATIC (IV SOLUTION) ×2 IMPLANT
KIT TURNOVER CYSTO (KITS) ×2 IMPLANT
MANIFOLD NEPTUNE II (INSTRUMENTS) IMPLANT
PACK CYSTO AR (MISCELLANEOUS) ×2 IMPLANT
SET CYSTO W/LG BORE CLAMP LF (SET/KITS/TRAYS/PACK) ×2 IMPLANT
STENT URET 6FRX22 CONTOUR (STENTS) ×2 IMPLANT
STENT URET 6FRX24 CONTOUR (STENTS) IMPLANT
STENT URET 6FRX26 CONTOUR (STENTS) IMPLANT
SURGILUBE 2OZ TUBE FLIPTOP (MISCELLANEOUS) ×2 IMPLANT
SYR 10ML LL (SYRINGE) ×2 IMPLANT
TUBE DRAIN URINARY 60IN (UROLOGICAL SUPPLIES) ×2 IMPLANT
WATER STERILE IRR 1000ML POUR (IV SOLUTION) ×2 IMPLANT
WATER STERILE IRR 500ML POUR (IV SOLUTION) ×2 IMPLANT

## 2020-11-25 NOTE — Care Management Note (Deleted)
Cross coverage note  : Patient hypotensive to 70/44, not tachycardic, mentating well and appears comfortable Brief summary: Patient admitted earlier with acute left pyelonephritis and noticed to have left UPJ obstruction without evidence of sepsis - She was evaluated by urology who recommended continuation of empiric Rocephin with the addition of Diflucan due to yeast seen on UA with the recommendations that if failure of clinical improvement or decompensation to contact urology (per urology note)  Assessment/plan  Severe hypotension in the setting of complicated UTI Possible sepsis Probable - LR bolus followed by LR at maintenance of 125 mL/h and assess BP response - Switch ceftriaxone to cefepime given complicated UTI - Sepsis panel ordered -(!) 70/44.  If blood pressure fails to respond, will transfer to stepdown/ICU for pressors and will contact urology for consideration of urgent procedure  This plan was communicated to charge nurse in the ED with patient is being held.  Orders have been placed.  Addendum: Patient did not respond to fluid challenge.  Sepsis work-up resulting with a lactic acid of 4.7, WBC 11,000 procalcitonin of 29Up from 2.21 earlier  Assessment:  Septic shock secondary to complicated UTI, failing fluid challenge  Plan: Continue IV fluid resuscitation Spoke with Dr. Bernardo Heater of urology will come in to do cystoscopy

## 2020-11-25 NOTE — Progress Notes (Signed)
PHARMACIST - PHYSICIAN COMMUNICATION  CONCERNING:  Enoxaparin (Lovenox) for DVT Prophylaxis    RECOMMENDATION: Patient was prescribed enoxaprin 40mg  q24 hours for VTE prophylaxis.   Filed Weights   11/24/20 2300  Weight: 61.2 kg (135 lb)    Body mass index is 23.91 kg/m.  Estimated Creatinine Clearance: 15.9 mL/min (A) (by C-G formula based on SCr of 2.22 mg/dL (H)).   Patient is candidate for enoxaparin 30mg  every 24 hours based on CrCl <23ml/min or Weight <45kg  DESCRIPTION: Pharmacy has adjusted enoxaparin dose per [facility] policy.  Patient is now receiving enoxaparin 30 mg every 24 hours   Benita Gutter 11/25/2020 9:52 PM

## 2020-11-25 NOTE — Progress Notes (Signed)
Sepsis tracking by eLINK 

## 2020-11-25 NOTE — Consult Note (Signed)
Urology Consult  I have been asked to see the patient by Dr. Francine Graven, for evaluation and management of left hydronephrosis.  Chief Complaint: Left lower abdominal pain, chills, nausea, vomiting  History of Present Illness: Heather [name] is a 85 y.o. year old female who presented to the ED overnight with reports of a 2-week history of low back pain radiating to the LLQ that acutely worsened 2 days ago and was associated with chills, nausea, and vomiting.  Admission labs notable for creatinine 1.20, baseline 0.77; WBC count 13.1; lactate 1.2; and UA with nitrites, 21-50 RBCs/hpf, >50 WBCs/hpf, no bacteria, budding yeast, and nonsquamous epithelial cells.  Urine culture pending, blood culture pending with no growth at <12 hours. On antibiotics as below.  She has been afebrile, VSS.  She underwent CTAP without contrast on admission which revealed moderate left hydronephrosis without obstructing stone and transition at the left UPJ consistent with UPJ obstruction.  There was also left perinephric stranding.  She is being admitted for management of acute left pyelonephritis.  Per chart review, she underwent CTAP without contrast on 08/23/2013 with findings of left hydronephrosis with a prominent extrarenal pelvis consistent with UPJ obstruction.  Original images unavailable for review.  Today she reports no history of recurrent UTIs or flank pain.  She does not recall ever having been seen by urologist.  She is feeling better now than she was upon admission with resolution of her pain.  Anti-infectives (From admission, onward)    Start     Dose/Rate Route Frequency Ordered Stop   11/26/20 0000  cefTRIAXone (ROCEPHIN) 1 g in sodium chloride 0.9 % 100 mL IVPB        1 g 200 mL/hr over 30 Minutes Intravenous Every 24 hours 11/25/20 0956     11/25/20 0500  cefTRIAXone (ROCEPHIN) 1 g in sodium chloride 0.9 % 100 mL IVPB        1 g 200 mL/hr over 30 Minutes Intravenous  Once 11/25/20 0447 11/25/20  0552       Past Medical History:  Diagnosis Date   Brain aneurysm    Cancer [facility]) lung   2016   Carotid artery stenosis    Carotid stenosis 06/19/2013   Overview:  Normal study, 1/16   Colonic polyp    GERD (gastroesophageal reflux disease)    Hiatal hernia    Hypercholesteremia    Hyperlipidemia, mixed 06/09/2015   Hypertension    Lung nodule 06/21/2014   Macrocytic 10/06/2014   Medicare annual wellness visit, initial 07/08/2016   Overview:  6/18   PAT (paroxysmal atrial tachycardia) ([place]) 06/19/2013   Plantar fasciitis    Tachycardia, paroxysmal ([place]) 03/31/14    Past Surgical History:  Procedure Laterality Date   BREAST EXCISIONAL BIOPSY Right 40 yrs ago   neg   BREAST LUMPECTOMY     benign   CATARACT EXTRACTION W/ INTRAOCULAR LENS  IMPLANT, BILATERAL Bilateral    CEREBRAL ANEURYSM REPAIR  2004   CERVIX LESION DESTRUCTION     COLONOSCOPY WITH PROPOFOL N/A 03/31/2015   Procedure: COLONOSCOPY WITH PROPOFOL;  Surgeon: Manya Silvas, MD;  Location: [facility] ENDOSCOPY;  Service: Endoscopy;  Laterality: N/A;   FLEXIBLE BRONCHOSCOPY N/A 03/10/2017   Procedure: FLEXIBLE BRONCHOSCOPY;  Surgeon: Nestor Lewandowsky, MD;  Location: [facility] ORS;  Service: Thoracic;  Laterality: N/A;   THORACOTOMY/LOBECTOMY Left 03/10/2017   Procedure: THORACOTOMY WITH LUNG WEDGE RESECTION POSSIBLE LOBECTOMY;  Surgeon: Nestor Lewandowsky, MD;  Location: [facility] ORS;  Service: Thoracic;  Laterality: Left;  TUBAL LIGATION      Home Medications:  Current Meds  Medication Sig   amLODipine (NORVASC) 5 MG tablet Take 5 mg by mouth at bedtime.   atenolol (TENORMIN) 50 MG tablet Take 50 mg by mouth at bedtime.   calcium-vitamin D (OSCAL WITH D) 500-200 MG-UNIT tablet Take 1 tablet by mouth.   cetirizine (ZYRTEC) 10 MG tablet Take 10 mg by mouth daily.   Cholecalciferol 1000 UNITS tablet Take 1,000 Units by mouth daily.   simvastatin (ZOCOR) 20 MG tablet Take 20 mg by mouth daily.   vitamin B-12 (CYANOCOBALAMIN) 100 MCG tablet  Take 100 mcg by mouth daily.    Allergies:  Allergies  Allergen Reactions   Ace Inhibitors Swelling   Contrast Media [Iodinated Diagnostic Agents]    Cortisone     Patient had facial swelling and itching from cortisone injection IM    Family History  Problem Relation Age of Onset   Alcohol abuse Mother    Heart attack Father    Breast cancer Neg Hx     Social History:  reports that she quit smoking about 32 years ago. She has never used smokeless tobacco. She reports current alcohol use. She reports that she does not use drugs.  ROS: A complete review of systems was performed.  All systems are negative except for pertinent findings as noted.  Physical Exam:  Vital signs in last 24 hours: Temp:  [98.2 F (36.8 C)-100 F (37.8 C)] 100 F (37.8 C) (11/01 1200) Pulse Rate:  [64-70] 70 (11/01 0502) Resp:  [18-20] 20 (11/01 0502) BP: (141-152)/(52-113) 152/57 (11/01 0502) SpO2:  [85 %-100 %] 100 % (11/01 0502) Weight:  [61.2 kg] 61.2 kg (10/31 2300) Constitutional:  Alert and oriented, no acute distress HEENT: [place] AT, moist mucus membranes Cardiovascular: No clubbing, cyanosis, or edema Respiratory: Normal respiratory effort Skin: No rashes, bruises or suspicious lesions Neurologic: Grossly intact, no focal deficits, moving all 4 extremities Psychiatric: Normal mood and affect  Laboratory Data:  Recent Labs    11/24/20 2320  WBC 13.1*  HGB 14.4  HCT 42.1   Recent Labs    11/24/20 2320  NA 139  K 3.8  CL 104  CO2 23  GLUCOSE 138*  BUN 23  CREATININE 1.20*  CALCIUM 9.3   Urinalysis    Component Value Date/Time   COLORURINE YELLOW (A) 11/25/2020 0309   APPEARANCEUR CLOUDY (A) 11/25/2020 0309   LABSPEC 1.020 11/25/2020 0309   PHURINE 5.0 11/25/2020 0309   GLUCOSEU NEGATIVE 11/25/2020 0309   HGBUR LARGE (A) 11/25/2020 0309   BILIRUBINUR NEGATIVE 11/25/2020 0309   KETONESUR 20 (A) 11/25/2020 0309   PROTEINUR 100 (A) 11/25/2020 0309   NITRITE POSITIVE (A)  11/25/2020 0309   LEUKOCYTESUR LARGE (A) 11/25/2020 0309   Results for orders placed or performed during the hospital encounter of 11/25/20  Resp Panel by RT-PCR (Flu A&B, Covid) Nasopharyngeal Swab     Status: None   Collection Time: 11/25/20 12:19 AM   Specimen: Nasopharyngeal Swab; Nasopharyngeal(NP) swabs in vial transport medium  Result Value Ref Range Status   SARS Coronavirus 2 by RT PCR NEGATIVE NEGATIVE Final    Comment: (NOTE) SARS-CoV-2 target nucleic acids are NOT DETECTED.  The SARS-CoV-2 RNA is generally detectable in upper respiratory specimens during the acute phase of infection. The lowest concentration of SARS-CoV-2 viral copies this assay can detect is 138 copies/mL. A negative result does not preclude SARS-Cov-2 infection and should not be used as the sole  basis for treatment or other patient management decisions. A negative result may occur with  improper specimen collection/handling, submission of specimen other than nasopharyngeal swab, presence of viral mutation(s) within the areas targeted by this assay, and inadequate number of viral copies(<138 copies/mL). A negative result must be combined with clinical observations, patient history, and epidemiological information. The expected result is Negative.  Fact Sheet for Patients:  EntrepreneurPulse.com.au  Fact Sheet for Healthcare Providers:  IncredibleEmployment.be  This test is no t yet approved or cleared by the [place] FDA and  has been authorized for detection and/or diagnosis of SARS-CoV-2 by FDA under an Emergency Use Authorization (EUA). This EUA will remain  in effect (meaning this test can be used) for the duration of the COVID-19 declaration under Section 564(b)(1) of the Act, 21 U.S.C.section 360bbb-3(b)(1), unless the authorization is terminated  or revoked sooner.       Influenza A by PCR NEGATIVE NEGATIVE Final   Influenza B by PCR NEGATIVE  NEGATIVE Final    Comment: (NOTE) The Xpert Xpress SARS-CoV-2/FLU/RSV plus assay is intended as an aid in the diagnosis of influenza from Nasopharyngeal swab specimens and should not be used as a sole basis for treatment. Nasal washings and aspirates are unacceptable for Xpert Xpress SARS-CoV-2/FLU/RSV testing.  Fact Sheet for Patients: EntrepreneurPulse.com.au  Fact Sheet for Healthcare Providers: IncredibleEmployment.be  This test is not yet approved or cleared by the [place] FDA and has been authorized for detection and/or diagnosis of SARS-CoV-2 by FDA under an Emergency Use Authorization (EUA). This EUA will remain in effect (meaning this test can be used) for the duration of the COVID-19 declaration under Section 564(b)(1) of the Act, 21 U.S.C. section 360bbb-3(b)(1), unless the authorization is terminated or revoked.  Performed at [facility], [address] [place], [place] [address]   Culture, blood (routine x 2)     Status: None (Preliminary result)   Collection Time: 11/25/20  3:00 AM   Specimen: BLOOD  Result Value Ref Range Status   Specimen Description BLOOD RIGHT ASSIST CONTROL  Final   Special Requests   Final    BOTTLES DRAWN AEROBIC AND ANAEROBIC Blood Culture results may not be optimal due to an excessive volume of blood received in culture bottles   Culture   Final    NO GROWTH < 12 HOURS Performed at [facility], [address] [place], [place] [address]    Report Status PENDING  Incomplete  Culture, blood (routine x 2)     Status: None (Preliminary result)   Collection Time: 11/25/20  3:00 AM   Specimen: BLOOD  Result Value Ref Range Status   Specimen Description BLOOD RIGHT WRIST  Final   Special Requests   Final    BOTTLES DRAWN AEROBIC AND ANAEROBIC Blood Culture results may not be optimal due to an excessive volume of blood received in culture bottles   Culture   Final    NO GROWTH  < 12 HOURS Performed at [facility], [address], [place], [place] [address]    Report Status PENDING  Incomplete    Radiologic Imaging: CT Abdomen Pelvis Wo Contrast  Result Date: 11/25/2020 CLINICAL DATA:  Abdominal pain.  Concern for acute diverticulitis. EXAM: CT ABDOMEN AND PELVIS WITHOUT CONTRAST TECHNIQUE: Multidetector CT imaging of the abdomen and pelvis was performed following the standard protocol without IV contrast. COMPARISON:  CT abdomen pelvis dated 08/23/2013 FINDINGS: Evaluation of this exam is limited in the absence of intravenous  contrast. Lower chest: Bibasilar linear atelectasis/scarring. There is a focus of calcification at the left lung base associated with linear scarring. No intra-abdominal free air or free fluid. Hepatobiliary: Small hepatic hypodense lesions are not characterized on this CT. No intrahepatic biliary ductal dilatation. The gallbladder is unremarkable. Pancreas: Unremarkable. No pancreatic ductal dilatation or surrounding inflammatory changes. Spleen: Normal in size without focal abnormality. Adrenals/Urinary Tract: The adrenal glands are unremarkable. There is moderate left hydronephrosis. No stone identified. A transition is noted at the left ureteropelvic junction. Underlying stricture or urothelial lesion is not excluded. Urology consult is advised. There is left perinephric stranding. Correlation with urinalysis recommended to exclude superimposed UTI. There is no hydronephrosis or nephrolithiasis on the right. The right ureter and urinary bladder appear unremarkable. Stomach/Bowel: There is sigmoid diverticulosis without active inflammatory changes. There is no bowel obstruction or active inflammation. There is a small hiatal hernia. The appendix is normal. Vascular/Lymphatic: Advanced aortoiliac atherosclerotic disease. There is a 2.1 cm infrarenal aortic ectasia. The IVC is unremarkable. No portal venous gas. There is no adenopathy.  Reproductive: The uterus is anteverted and grossly unremarkable. No adnexal masses. Other: None Musculoskeletal: Degenerative changes of the spine. No acute osseous pathology. IMPRESSION: 1. Moderate left hydronephrosis with transition at the left ureteropelvic junction. No stone identified. Underlying stricture or urothelial lesion is not excluded. Urology consult is advised. 2. Sigmoid diverticulosis. No bowel obstruction. Normal appendix. 3. Aortic Atherosclerosis (ICD10-I70.0). Electronically Signed   By: Anner Crete M.D.   On: 11/25/2020 01:18   Assessment & Plan:  85 year old female with a chronic left UPJ obstruction dating back to at least 2015 not requiring intervention currently admitted with acute left pyelonephritis and AKI with no evidence of sepsis.  Agree with empiric antibiotics, though would recommend adding on empiric Diflucan given budding yeast on admission UA until urine culture results.  Renal function has been stable leading up to her current admission and she has been asymptomatic for chronic left hydronephrosis.  Given this, I recommend conservative management with antimicrobials and supportive care at this time.  We discussed that if her creatinine fails to improve, she fails to clinically improve, or she develops persistent pain, we may consider more aggressive intervention with ureteral stent placement.  We discussed the need for outpatient follow-up with likely annual renal ultrasounds and the possibility of chronic indwelling ureteral stents in the future if she develops recurrent UTIs, chronic flank pain, or worsening renal function.  Recommendations: -Continue empiric antibiotics and start empiric Diflucan.  Follow urine cultures and narrow per results. -Trend CBC and BMP -Contact urology if she acutely decompensates or fails to clinically improve -Outpatient urology follow-up in 1 month with Dr. Bernardo Heater  Thank you for involving me in this patient's care, please  page with any further questions or concerns.  Debroah Loop, PA-C 11/25/2020 2:44 PM

## 2020-11-25 NOTE — Transfer of Care (Signed)
Immediate Anesthesia Transfer of Care Note  Patient: LILLAN MCCREADIE  Procedure(s) Performed: CYSTOSCOPY WITH STENT PLACEMENT (Left)  Patient Location: PACU  Anesthesia Type:MAC  Level of Consciousness: sedated and patient cooperative  Airway & Oxygen Therapy: Patient Spontanous Breathing and Patient connected to nasal cannula oxygen  Post-op Assessment: Report given to RN and Post -op Vital signs reviewed and stable  Post vital signs: Reviewed and stable  Last Vitals:  Vitals Value Taken Time  BP 87/54 11/25/20 2356  Temp 36.5 C 11/25/20 2356  Pulse 77 11/25/20 2359  Resp 21 11/25/20 2359  SpO2 93 % 11/25/20 2359  Vitals shown include unvalidated device data.  Last Pain:  Vitals:   11/25/20 2356  TempSrc:   PainSc: Asleep         Complications: No notable events documented.

## 2020-11-25 NOTE — Anesthesia Preprocedure Evaluation (Signed)
Anesthesia Evaluation  Patient identified by MRN, date of birth, ID band Patient awake  General Assessment Comment:  Obstructive acute renal failure, for cysto and stent placement.  Had nausea and vomiting before; no vomiting since yesterday. Denies nausea currently. Appears well and not systemically ill. Has been hypotensive to SBP 70s however. On broad spectrum abx.  Reviewed: Allergy & Precautions, H&P , NPO status , Patient's Chart, lab work & pertinent test results  History of Anesthesia Complications (+) PONV and history of anesthetic complications  Airway Mallampati: II  TM Distance: >3 FB Neck ROM: limited    Dental  (+) Chipped, Poor Dentition, Missing, Implants   Pulmonary neg shortness of breath, COPD,  COPD inhaler, former smoker,    breath sounds clear to auscultation       Cardiovascular Exercise Tolerance: Good hypertension, (-) angina+ Peripheral Vascular Disease  (-) Past MI and (-) DOE + dysrhythmias Atrial Fibrillation  Rhythm:Regular Rate:Normal - Systolic murmurs    Neuro/Psych  Neuromuscular disease negative psych ROS   GI/Hepatic Neg liver ROS, hiatal hernia, GERD  Medicated and Controlled,  Endo/Other  negative endocrine ROS  Renal/GU ARFRenal disease     Musculoskeletal  (+) Arthritis ,   Abdominal   Peds  Hematology negative hematology ROS (+)   Anesthesia Other Findings Past Medical History: No date: Brain aneurysm lung: Cancer ([place])     Comment:  2016 No date: Carotid artery stenosis 06/19/2013: Carotid stenosis     Comment:  Overview:  Normal study, 1/16 No date: Colonic polyp No date: GERD (gastroesophageal reflux disease) No date: Hiatal hernia No date: Hypercholesteremia 06/09/2015: Hyperlipidemia, mixed No date: Hypertension 06/21/2014: Lung nodule 10/06/2014: Macrocytic 07/08/2016: Medicare annual wellness visit, initial     Comment:  Overview:  6/18 06/19/2013: PAT  (paroxysmal atrial tachycardia) ([facility]) No date: Plantar fasciitis 03/31/14: Tachycardia, paroxysmal ([place])  Past Surgical History: 40 yrs ago: BREAST EXCISIONAL BIOPSY; Right     Comment:  neg No date: BREAST LUMPECTOMY     Comment:  benign No date: CATARACT EXTRACTION W/ INTRAOCULAR LENS  IMPLANT, BILATERAL;  Bilateral 2004: CEREBRAL ANEURYSM REPAIR No date: CERVIX LESION DESTRUCTION 03/31/2015: COLONOSCOPY WITH PROPOFOL; N/A     Comment:  Procedure: COLONOSCOPY WITH PROPOFOL;  Surgeon: Manya Silvas, MD;  Location: [place];  Service:               Endoscopy;  Laterality: N/A; No date: TUBAL LIGATION  BMI    Body Mass Index:  25.79 kg/m      Reproductive/Obstetrics negative OB ROS                             Anesthesia Physical  Anesthesia Plan  ASA: 3 and emergent  Anesthesia Plan: MAC   Post-op Pain Management:    Induction: Intravenous  PONV Risk Score and Plan: 4 or greater and Ondansetron, Midazolam and TIVA  Airway Management Planned: Natural Airway  Additional Equipment: None  Intra-op Plan:   Post-operative Plan:   Informed Consent: I have reviewed the patients History and Physical, chart, labs and discussed the procedure including the risks, benefits and alternatives for the proposed anesthesia with the patient or authorized representative who has indicated his/her understanding and acceptance.     Dental Advisory Given  Plan Discussed with: Anesthesiologist, CRNA and Surgeon  Anesthesia Plan Comments: (Discussed risks of anesthesia with patient, including  possibility of difficulty with spontaneous ventilation under anesthesia necessitating airway intervention, PONV, and rare risks such as cardiac or respiratory or neurological events, and allergic reactions. Told about risk of aspiration, but given no vomiting or nausea today, reasonable to proceed with natural airway. Patient understands.)         Anesthesia Quick Evaluation

## 2020-11-25 NOTE — Progress Notes (Signed)
CODE SEPSIS - PHARMACY COMMUNICATION  **Broad Spectrum Antibiotics should be administered within 1 hour of Sepsis diagnosis**  Time Code Sepsis Called/Page Received: 1951  Antibiotics Ordered: cefepime (pt already on ceftriaxone)  Time of 1st antibiotic administration: 0508 (pt already on abx at time of code sepsis, escalated to cefepime)   Tawnya Crook, PharmD, BCPS Clinical Pharmacist 11/25/2020 7:54 PM

## 2020-11-25 NOTE — ED Provider Notes (Signed)
[facility] Emergency Department Provider Note   ____________________________________________   None    (approximate)  I have reviewed the triage vital signs and the nursing notes.   HISTORY  Chief Complaint Abdominal Pain (L lower abd pain started at noon)    HPI Heather [name] is a 85 y.o. female who presents to the ED from home with a chief complaint of chills, left lower abdominal pain, nausea and vomiting.  Patient with a history of COPD not on home oxygen, GERD, hyperlipidemia, hypertension who presents with a 1 day history of the above.  Denies cough but endorses shortness of breath.  Denies chest pain, dysuria or diarrhea.     Past Medical History:  Diagnosis Date   Brain aneurysm    Cancer [facility]) lung   2016   Carotid artery stenosis    Carotid stenosis 06/19/2013   Overview:  Normal study, 1/16   Colonic polyp    GERD (gastroesophageal reflux disease)    Hiatal hernia    Hypercholesteremia    Hyperlipidemia, mixed 06/09/2015   Hypertension    Lung nodule 06/21/2014   Macrocytic 10/06/2014   Medicare annual wellness visit, initial 07/08/2016   Overview:  6/18   PAT (paroxysmal atrial tachycardia) ([place]) 06/19/2013   Plantar fasciitis    Tachycardia, paroxysmal ([place]) 03/31/14    Patient Active Problem List   Diagnosis Date Noted   Hypoxia 11/25/2020   Panlobular emphysema ([place]) 11/28/2018   Benign essential hypertension 08/17/2017   Malignant neoplasm of lower lobe of left lung ([place]) 03/10/2017   Medicare annual wellness visit, initial 07/08/2016   DDD (degenerative disc disease), lumbar 02/10/2016   Hyperlipidemia, mixed 06/09/2015   Macrocytic 10/06/2014   Lung nodule 06/21/2014   Primary cancer of right upper lobe of lung ([place]) 03/13/2014   Brain aneurysm 06/19/2013   Carotid stenosis 06/19/2013   GERD (gastroesophageal reflux disease) 06/19/2013   PAT (paroxysmal atrial tachycardia) ([place]) 06/19/2013    Past Surgical History:   Procedure Laterality Date   BREAST EXCISIONAL BIOPSY Right 40 yrs ago   neg   BREAST LUMPECTOMY     benign   CATARACT EXTRACTION W/ INTRAOCULAR LENS  IMPLANT, BILATERAL Bilateral    CEREBRAL ANEURYSM REPAIR  2004   CERVIX LESION DESTRUCTION     COLONOSCOPY WITH PROPOFOL N/A 03/31/2015   Procedure: COLONOSCOPY WITH PROPOFOL;  Surgeon: Manya Silvas, MD;  Location: [facility];  Service: Endoscopy;  Laterality: N/A;   FLEXIBLE BRONCHOSCOPY N/A 03/10/2017   Procedure: FLEXIBLE BRONCHOSCOPY;  Surgeon: Nestor Lewandowsky, MD;  Location: [facility];  Service: Thoracic;  Laterality: N/A;   THORACOTOMY/LOBECTOMY Left 03/10/2017   Procedure: THORACOTOMY WITH LUNG WEDGE RESECTION POSSIBLE LOBECTOMY;  Surgeon: Nestor Lewandowsky, MD;  Location: [facility];  Service: Thoracic;  Laterality: Left;   TUBAL LIGATION      Prior to Admission medications   Medication Sig Start Date End Date Taking? Authorizing Provider  ALPRAZolam (XANAX) 0.25 MG tablet Take 0.25 mg by mouth 2 (two) times daily as needed for anxiety.  Patient not taking: Reported on 09/15/2020 06/12/15   [provider]  amLODipine (NORVASC) 5 MG tablet Take by mouth. 06/10/17 09/15/20  [provider]  amoxicillin (AMOXIL) 500 MG capsule  09/05/17   [provider]  atenolol (TENORMIN) 50 MG tablet Take 50 mg by mouth at bedtime.    [provider]  calcium-vitamin D (OSCAL WITH D) 500-200 MG-UNIT tablet Take 1 tablet by mouth.    [provider]  cetirizine (ZYRTEC) 10 MG tablet Take 10 mg by mouth daily.    [provider]  Cholecalciferol 1000 UNITS tablet Take 1,000 Units by mouth daily.    [provider]  Coenzyme Q10 (CO Q 10) 100 MG CAPS Take 1 capsule by mouth daily. Patient not taking: Reported on 09/15/2020    [provider]  donepezil (ARICEPT) 5 MG tablet Take 5 mg by mouth at bedtime.  Patient not taking: Reported on 09/15/2020 07/27/16   [provider]   fluticasone (FLONASE) 50 MCG/ACT nasal spray Place 1 spray into the nose daily as needed for allergies.  04/05/15   [provider]  Fluticasone-Salmeterol (ADVAIR) 100-50 MCG/DOSE AEPB Inhale into the lungs. 12/13/18 09/15/20  [provider]  hydrochlorothiazide (HYDRODIURIL) 25 MG tablet Take 25 mg by mouth daily as needed.    [provider]  oxyCODONE-acetaminophen (PERCOCET/ROXICET) 5-325 MG tablet Take 1-2 tablets by mouth every 4 (four) hours as needed for moderate pain. Patient not taking: Reported on 09/15/2020 03/15/17   Nestor Lewandowsky, MD  simvastatin (ZOCOR) 20 MG tablet Take 20 mg by mouth daily.    [provider]  vitamin B-12 (CYANOCOBALAMIN) 100 MCG tablet Take 100 mcg by mouth daily.    [provider]    Allergies Ace inhibitors, Contrast media [iodinated diagnostic agents], and Cortisone  Family History  Problem Relation Age of Onset   Alcohol abuse Mother    Heart attack Father    Breast cancer Neg Hx     Social History Social History   Tobacco Use   Smoking status: Former    Years: 35.00    Types: Cigarettes    Quit date: 06/10/1988    Years since quitting: 32.4   Smokeless tobacco: Never  Vaping Use   Vaping Use: Never used  Substance Use Topics   Alcohol use: Yes    Alcohol/week: 0.0 standard drinks    Comment: glass of wine daily   Drug use: No    Review of Systems  Constitutional: No fever/chills Eyes: No visual changes. ENT: No sore throat. Cardiovascular: Denies chest pain. Respiratory: Positive for shortness of breath. Gastrointestinal: Positive for abdominal pain, nausea and vomiting.  No diarrhea.  No constipation. Genitourinary: Negative for dysuria. Musculoskeletal: Negative for back pain. Skin: Negative for rash. Neurological: Negative for headaches, focal weakness or numbness.   ____________________________________________   PHYSICAL EXAM:  VITAL SIGNS: ED Triage Vitals  Enc Vitals  Group     BP 11/24/20 2252 (!) 142/113     Pulse Rate 11/24/20 2252 64     Resp 11/24/20 2252 20     Temp 11/24/20 2252 99 F (37.2 C)     Temp Source 11/24/20 2252 Oral     SpO2 11/24/20 2250 (!) 85 %     Weight 11/24/20 2300 135 lb (61.2 kg)     Height 11/24/20 2300 _0  (1.6 m)     Head Circumference --      Peak Flow --      Pain Score 11/24/20 2259 9     Pain Loc --      Pain Edu? --      Excl. in [place]? --     Constitutional: Alert and oriented.  Elderly appearing and in mild acute distress. Eyes: Conjunctivae are normal. PERRL. EOMI. Head: Atraumatic. Nose: No congestion/rhinnorhea. Mouth/Throat: Mucous membranes are mildly dry. Neck: No stridor.   Cardiovascular: Normal rate, regular rhythm. Grossly normal heart sounds.  Good peripheral circulation. Respiratory: Normal respiratory effort.  No retractions. Lungs diminished bibasilarly. Gastrointestinal: Soft and mildly tender to palpation left lower quadrant without rebound or guarding. No distention. No abdominal bruits. No CVA tenderness. Musculoskeletal: No lower extremity tenderness nor edema.  No joint effusions. Neurologic:  Normal speech and language. No gross focal neurologic deficits are appreciated.  Skin:  Skin is warm, dry and intact. No rash noted.  No vesicles. Psychiatric: Mood and affect are normal. Speech and behavior are normal.  ____________________________________________   LABS (all labs ordered are listed, but only abnormal results are displayed)  Labs Reviewed  COMPREHENSIVE METABOLIC PANEL - Abnormal; Notable for the following components:      Result Value   Glucose, Bld 138 (*)    Creatinine, Ser 1.20 (*)    GFR, Estimated 45 (*)    All other components within normal limits  CBC - Abnormal; Notable for the following components:   WBC 13.1 (*)    MCV 101.0 (*)    MCH 34.5 (*)    All other components within normal limits  URINALYSIS, ROUTINE W REFLEX MICROSCOPIC - Abnormal; Notable for the  following components:   Color, Urine YELLOW (*)    APPearance CLOUDY (*)    Hgb urine dipstick LARGE (*)    Ketones, ur 20 (*)    Protein, ur 100 (*)    Nitrite POSITIVE (*)    Leukocytes,Ua LARGE (*)    WBC, UA >50 (*)    Non Squamous Epithelial PRESENT (*)    All other components within normal limits  RESP PANEL BY RT-PCR (FLU A&B, COVID) ARPGX2  CULTURE, BLOOD (ROUTINE X 2)  CULTURE, BLOOD (ROUTINE X 2)  URINE CULTURE  LIPASE, BLOOD  LACTIC ACID, PLASMA  PROCALCITONIN  TROPONIN I (HIGH SENSITIVITY)  TROPONIN I (HIGH SENSITIVITY)   ____________________________________________  EKG  ED ECG REPORT I, Amarissa Koerner J, the attending physician, personally viewed and interpreted this ECG.   Date: 11/25/2020  EKG Time: 2309  Rate: 75  Rhythm: normal sinus rhythm  Axis: Normal  Intervals:none  ST&T Change: Nonspecific  ____________________________________________  RADIOLOGY I, Ferry Matthis J, personally viewed and evaluated these images (plain radiographs) as part of my medical decision making, as well as reviewing the written report by the radiologist.  ED MD interpretation: Moderate left hydronephrosis without stone, left perinephric stranding; no acute cardiopulmonary process  Official radiology report(s): CT Abdomen Pelvis Wo Contrast  Result Date: 11/25/2020 CLINICAL DATA:  Abdominal pain.  Concern for acute diverticulitis. EXAM: CT ABDOMEN AND PELVIS WITHOUT CONTRAST TECHNIQUE: Multidetector CT imaging of the abdomen and pelvis was performed following the standard protocol without IV contrast. COMPARISON:  CT abdomen pelvis dated 08/23/2013 FINDINGS: Evaluation of this exam is limited in the absence of intravenous contrast. Lower chest: Bibasilar linear atelectasis/scarring. There is a focus of calcification at the left lung base associated with linear scarring. No intra-abdominal free air or free fluid. Hepatobiliary: Small hepatic hypodense lesions are not characterized on  this CT. No intrahepatic biliary ductal dilatation. The gallbladder is unremarkable. Pancreas: Unremarkable. No pancreatic ductal dilatation or surrounding inflammatory changes. Spleen: Normal in size without focal abnormality. Adrenals/Urinary Tract: The adrenal glands are unremarkable. There is moderate left hydronephrosis. No stone identified. A transition is noted at the left ureteropelvic junction. Underlying stricture or urothelial lesion is not excluded. Urology consult is advised. There is left perinephric stranding. Correlation with urinalysis recommended to exclude superimposed UTI. There is no hydronephrosis or nephrolithiasis on the right. The right  ureter and urinary bladder appear unremarkable. Stomach/Bowel: There is sigmoid diverticulosis without active inflammatory changes. There is no bowel obstruction or active inflammation. There is a small hiatal hernia. The appendix is normal. Vascular/Lymphatic: Advanced aortoiliac atherosclerotic disease. There is a 2.1 cm infrarenal aortic ectasia. The IVC is unremarkable. No portal venous gas. There is no adenopathy. Reproductive: The uterus is anteverted and grossly unremarkable. No adnexal masses. Other: None Musculoskeletal: Degenerative changes of the spine. No acute osseous pathology. IMPRESSION: 1. Moderate left hydronephrosis with transition at the left ureteropelvic junction. No stone identified. Underlying stricture or urothelial lesion is not excluded. Urology consult is advised. 2. Sigmoid diverticulosis. No bowel obstruction. Normal appendix. 3. Aortic Atherosclerosis (ICD10-I70.0). Electronically Signed   By: Anner Crete M.D.   On: 11/25/2020 01:18   DG Chest 2 View  Result Date: 11/24/2020 CLINICAL DATA:  Hypoxia. EXAM: CHEST - 2 VIEW COMPARISON:  Chest radiograph dated 05/30/2017. FINDINGS: Postsurgical changes of right upper lobe. No consolidative changes there is no pleural effusion pneumothorax. The cardiac silhouette is within  limits. Atherosclerotic calcification of the aorta. No acute osseous pathology. IMPRESSION: No acute cardiopulmonary process. Electronically Signed   By: Anner Crete M.D.   On: 11/24/2020 23:33    ____________________________________________   PROCEDURES  Procedure(s) performed (including Critical Care):  Procedures   ____________________________________________   INITIAL IMPRESSION / ASSESSMENT AND PLAN / ED COURSE  As part of my medical decision making, I reviewed the following data within the [place] History obtained from family, Nursing notes reviewed and incorporated, Labs reviewed, EKG interpreted, Old chart reviewed, Radiograph reviewed, Discussed with admitting physician, and Notes from prior ED visits     85 year old female with COPD presenting with hypoxia and left lower quadrant abdominal pain. Differential diagnosis includes, but is not limited to, ovarian cyst, ovarian torsion, acute appendicitis, diverticulitis, urinary tract infection/pyelonephritis, endometriosis, bowel obstruction, colitis, renal colic, gastroenteritis, hernia, fibroids, etc. Differential includes, but is not limited to, viral syndrome, bronchitis including COPD exacerbation, pneumonia, reactive airway disease including asthma, CHF including exacerbation with or without pulmonary/interstitial edema, pneumothorax, ACS, thoracic trauma, and pulmonary embolism.   Laboratory results remarkable for mild leukocytosis, AKI, nitrite and leukocyte positive UA; CT scan demonstrates hydroureter.  Patient's with persistent nausea after ODT Zofran administered.  Will initiate IV fluid hydration, analgesic, antiemetic, antibiotic, DuoNeb.  Hold Solu-Medrol as patient has swelling due to cortisone.  Will discuss with hospitalist services for admission.  Will require urology consultation while patient is hospitalized.      ____________________________________________   FINAL CLINICAL  IMPRESSION(S) / ED DIAGNOSES  Final diagnoses:  Chronic obstructive pulmonary disease, unspecified COPD type ([place])  Urinary tract infection without hematuria, site unspecified  Hydroureter  Hypoxia     ED Discharge Orders     None        Note:  This document was prepared using Dragon voice recognition software and may include unintentional dictation errors.    Paulette Blanch, MD 11/25/20 [phone]

## 2020-11-25 NOTE — H&P (Signed)
History and Physical    Heather [name] OAC:[id number] DOB: 12-Sep-1935 DOA: 11/25/2020  PCP: Rusty Aus, MD   Patient coming from: Home  I have personally briefly reviewed patient's old medical records in [place]  Chief Complaint: Abdominal pain history.  HPI: Heather [name] is a 85 y.o. female with medical history significant for hypertension, GERD, paroxysmal atrial tachycardia, history of brain aneurysm who presents to the ER for evaluation of abdominal pain which started about 2 weeks ago but got worse 2 days prior to admission. Pain is located mostly in the LLQ and left lumbar area and is rated a 7/10 in intensity at its worst.  Pain was nonradiating and is associated with nausea and multiple episodes of emesis but she denies having any changes in her bowel habits. She denies having any fever or chills, no cough, no shortness of breath, no dizziness or light headedness, no urinary frequency, no nocturia or dysuria, no lower extremity swelling, no palpitations or diaphoresis, no headache, no blurred vision, no focal deficit. Labs show sodium 139, potassium 3.8, chloride 104, bicarb 23, glucose 138, BUN 23, creatinine 1.20 above a baseline of 0.77, calcium 9.3, alkaline phosphatase 44, albumin 3.8, lipase 35, AST 19, ALT 14, total protein 6.5, troponin 5 >> 6, lactic acid 1.2, procalcitonin 2.21, white count 13.1, hemoglobin 14.4, hematocrit 32.1, MCV 101, RDW 12.7, platelet count 154 Respiratory viral panel is negative Chest x-ray reviewed by me shows no evidence of acute cardiopulmonary disease CT scan of abdomen and pelvis without contrast shows moderate left hydronephrosis with transition at the left ureteropelvic junction. No stone identified. Underlying stricture or urothelial lesion is not excluded. Urology consult is advised. Sigmoid diverticulosis. No bowel obstruction. Normal appendix. Aortic Atherosclerosis Urine analysis shows pyuria Twelve-lead EKG reviewed by me shows  normal sinus rhythm.    ED Course: Patient is an 85 year old female who presents to the emergency room for evaluation of a 2-day history of worsening left lower quadrant/left lumbar pain associated with nausea and multiple episodes of emesis. In the emergency room she had shaking chills and was found to have room air pulse oximetry of 85%.  She was placed on 4 L of oxygen with improvement in her pulse oximetry to 94%. Labs show worsening of her renal function from baseline 0.77 >> 1.20.  She also has pyuria and leukocytosis Imaging shows left-sided hydronephrosis She will be admitted to the hospital for further evaluation.   Review of Systems: As per HPI otherwise all other systems reviewed and negative.    Past Medical History:  Diagnosis Date   Brain aneurysm    [facility]cer [facility]) lung   2016   Carotid artery stenosis    Carotid stenosis 06/19/2013   Overview:  Normal study, 1/16   Colonic polyp    GERD (gastroesophageal reflux disease)    Hiatal hernia    Hypercholesteremia    Hyperlipidemia, mixed 06/09/2015   Hypertension    Lung nodule 06/21/2014   Macrocytic 10/06/2014   Medicare annual wellness visit, initial 07/08/2016   Overview:  6/18   PAT (paroxysmal atrial tachycardia) ([place]) 06/19/2013   Plantar fasciitis    Tachycardia, paroxysmal ([place]) 03/31/14    Past Surgical History:  Procedure Laterality Date   BREAST EXCISIONAL BIOPSY Right 40 yrs ago   neg   BREAST LUMPECTOMY     benign   CATARACT EXTRACTION W/ INTRAOCULAR LENS  IMPLANT, BILATERAL Bilateral    CEREBRAL ANEURYSM REPAIR  2004   CERVIX LESION  DESTRUCTION     COLONOSCOPY WITH PROPOFOL N/A 03/31/2015   Procedure: COLONOSCOPY WITH PROPOFOL;  Surgeon: Manya Silvas, MD;  Location: [facility] ENDOSCOPY;  Service: Endoscopy;  Laterality: N/A;   FLEXIBLE BRONCHOSCOPY N/A 03/10/2017   Procedure: FLEXIBLE BRONCHOSCOPY;  Surgeon: Nestor Lewandowsky, MD;  Location: [facility] ORS;  Service: Thoracic;  Laterality: N/A;    THORACOTOMY/LOBECTOMY Left 03/10/2017   Procedure: THORACOTOMY WITH LUNG WEDGE RESECTION POSSIBLE LOBECTOMY;  Surgeon: Nestor Lewandowsky, MD;  Location: [facility] ORS;  Service: Thoracic;  Laterality: Left;   TUBAL LIGATION       reports that she quit smoking about 32 years ago. She has never used smokeless tobacco. She reports current alcohol use. She reports that she does not use drugs.  Allergies  Allergen Reactions   Ace Inhibitors Swelling   Contrast Media [Iodinated Diagnostic Agents]    Cortisone     Patient had facial swelling and itching from cortisone injection IM    Family History  Problem Relation Age of Onset   Alcohol abuse Mother    Heart attack Father    Breast cancer Neg Hx       Prior to Admission medications   Medication Sig Start Date End Date Taking? Authorizing Provider  ALPRAZolam (XANAX) 0.25 MG tablet Take 0.25 mg by mouth 2 (two) times daily as needed for anxiety.  Patient not taking: Reported on 09/15/2020 06/12/15   [provider]  amLODipine (NORVASC) 5 MG tablet Take by mouth. 06/10/17 09/15/20  [provider]  amoxicillin (AMOXIL) 500 MG capsule  09/05/17   [provider]  atenolol (TENORMIN) 50 MG tablet Take 50 mg by mouth at bedtime.    [provider]  calcium-vitamin D (OSCAL WITH D) 500-200 MG-UNIT tablet Take 1 tablet by mouth.    [provider]  cetirizine (ZYRTEC) 10 MG tablet Take 10 mg by mouth daily.    [provider]  Cholecalciferol 1000 UNITS tablet Take 1,000 Units by mouth daily.    [provider]  Coenzyme Q10 (CO Q 10) 100 MG CAPS Take 1 capsule by mouth daily. Patient not taking: Reported on 09/15/2020    [provider]  donepezil (ARICEPT) 5 MG tablet Take 5 mg by mouth at bedtime.  Patient not taking: Reported on 09/15/2020 07/27/16   [provider]  fluticasone (FLONASE) 50 MCG/ACT nasal spray Place 1 spray into the nose daily as needed for allergies.   04/05/15   [provider]  Fluticasone-Salmeterol (ADVAIR) 100-50 MCG/DOSE AEPB Inhale into the lungs. 12/13/18 09/15/20  [provider]  hydrochlorothiazide (HYDRODIURIL) 25 MG tablet Take 25 mg by mouth daily as needed.    [provider]  oxyCODONE-acetaminophen (PERCOCET/ROXICET) 5-325 MG tablet Take 1-2 tablets by mouth every 4 (four) hours as needed for moderate pain. Patient not taking: Reported on 09/15/2020 03/15/17   Nestor Lewandowsky, MD  simvastatin (ZOCOR) 20 MG tablet Take 20 mg by mouth daily.    [provider]  vitamin B-12 (CYANOCOBALAMIN) 100 MCG tablet Take 100 mcg by mouth daily.    [provider]    Physical Exam: Vitals:   11/24/20 2300 11/25/20 0241 11/25/20 0502 11/25/20 0800  BP:  (!) 141/52 (!) 152/57   Pulse:  66 70   Resp:  18 20   Temp:  98.2 F (36.8 C)  99.2 F (37.3 C)  TempSrc:  Oral    SpO2:  95% 100%   Weight: 61.2 kg     Height: 5'  3" (1.6 m)        Vitals:   11/24/20 2300 11/25/20 0241 11/25/20 0502 11/25/20 0800  BP:  (!) 141/52 (!) 152/57   Pulse:  66 70   Resp:  18 20   Temp:  98.2 F (36.8 C)  99.2 F (37.3 C)  TempSrc:  Oral    SpO2:  95% 100%   Weight: 61.2 kg     Height: _0  (1.6 m)         Constitutional: Alert and oriented x 3 . Not in any apparent distress.  Appears uncomfortable HEENT:      Head: Normocephalic and atraumatic.         Eyes: PERLA, EOMI, Conjunctivae are normal. Sclera is non-icteric.       Mouth/Throat: Mucous membranes are moist.       Neck: Supple with no signs of meningismus. Cardiovascular: Regular rate and rhythm. No murmurs, gallops, or rubs. 2+ symmetrical distal pulses are present . No JVD. No LE edema Respiratory: Respiratory effort normal .Lungs sounds clear bilaterally. No wheezes, crackles, or rhonchi.  Gastrointestinal: Soft, tender left lower quadrant, and non distended with positive bowel sounds.  Genitourinary: Mild left CVA  tenderness. Musculoskeletal: Nontender with normal range of motion in all extremities. No cyanosis, or erythema of extremities. Neurologic:  Face is symmetric. Moving all extremities. No gross focal neurologic deficits . Skin: Skin is warm, dry.  No rash or ulcers Psychiatric: Mood and affect are normal    Labs on Admission: I have personally reviewed following labs and imaging studies  CBC: Recent Labs  Lab 11/24/20 2320  WBC 13.1*  HGB 14.4  HCT 42.1  MCV 101.0*  PLT 623   Basic Metabolic Panel: Recent Labs  Lab 11/24/20 2320  NA 139  K 3.8  CL 104  CO2 23  GLUCOSE 138*  BUN 23  CREATININE 1.20*  CALCIUM 9.3   GFR: Estimated Creatinine Clearance: 29.4 mL/min (A) (by C-G formula based on SCr of 1.2 mg/dL (H)). Liver Function Tests: Recent Labs  Lab 11/24/20 2320  AST 19  ALT 14  ALKPHOS 44  BILITOT 0.7  PROT 6.5  ALBUMIN 3.8   Recent Labs  Lab 11/24/20 2320  LIPASE 35   No results for input(s): AMMONIA in the last 168 hours. Coagulation Profile: No results for input(s): INR, PROTIME in the last 168 hours. Cardiac Enzymes: No results for input(s): CKTOTAL, CKMB, CKMBINDEX, TROPONINI in the last 168 hours. BNP (last 3 results) No results for input(s): PROBNP in the last 8760 hours. HbA1C: No results for input(s): HGBA1C in the last 72 hours. CBG: No results for input(s): GLUCAP in the last 168 hours. Lipid Profile: No results for input(s): CHOL, HDL, LDLCALC, TRIG, CHOLHDL, LDLDIRECT in the last 72 hours. Thyroid Function Tests: No results for input(s): TSH, T4TOTAL, FREET4, T3FREE, THYROIDAB in the last 72 hours. Anemia Panel: No results for input(s): VITAMINB12, FOLATE, FERRITIN, TIBC, IRON, RETICCTPCT in the last 72 hours. Urine analysis:    Component Value Date/Time   COLORURINE YELLOW (A) 11/25/2020 0309   APPEARANCEUR CLOUDY (A) 11/25/2020 0309   LABSPEC 1.020 11/25/2020 0309   PHURINE 5.0 11/25/2020 0309   GLUCOSEU NEGATIVE 11/25/2020  0309   HGBUR LARGE (A) 11/25/2020 0309   BILIRUBINUR NEGATIVE 11/25/2020 0309   KETONESUR 20 (A) 11/25/2020 0309   PROTEINUR 100 (A) 11/25/2020 0309   NITRITE POSITIVE (A) 11/25/2020 0309   LEUKOCYTESUR LARGE (A) 11/25/2020 0309    Radiological Exams on Admission:  CT Abdomen Pelvis Wo Contrast  Result Date: 11/25/2020 CLINICAL DATA:  Abdominal pain.  Concern for acute diverticulitis. EXAM: CT ABDOMEN AND PELVIS WITHOUT CONTRAST TECHNIQUE: Multidetector CT imaging of the abdomen and pelvis was performed following the standard protocol without IV contrast. COMPARISON:  CT abdomen pelvis dated 08/23/2013 FINDINGS: Evaluation of this exam is limited in the absence of intravenous contrast. Lower chest: Bibasilar linear atelectasis/scarring. There is a focus of calcification at the left lung base associated with linear scarring. No intra-abdominal free air or free fluid. Hepatobiliary: Small hepatic hypodense lesions are not characterized on this CT. No intrahepatic biliary ductal dilatation. The gallbladder is unremarkable. Pancreas: Unremarkable. No pancreatic ductal dilatation or surrounding inflammatory changes. Spleen: Normal in size without focal abnormality. Adrenals/Urinary Tract: The adrenal glands are unremarkable. There is moderate left hydronephrosis. No stone identified. A transition is noted at the left ureteropelvic junction. Underlying stricture or urothelial lesion is not excluded. Urology consult is advised. There is left perinephric stranding. Correlation with urinalysis recommended to exclude superimposed UTI. There is no hydronephrosis or nephrolithiasis on the right. The right ureter and urinary bladder appear unremarkable. Stomach/Bowel: There is sigmoid diverticulosis without active inflammatory changes. There is no bowel obstruction or active inflammation. There is a small hiatal hernia. The appendix is normal. Vascular/Lymphatic: Advanced aortoiliac atherosclerotic disease. There is a  2.1 cm infrarenal aortic ectasia. The IVC is unremarkable. No portal venous gas. There is no adenopathy. Reproductive: The uterus is anteverted and grossly unremarkable. No adnexal masses. Other: None Musculoskeletal: Degenerative changes of the spine. No acute osseous pathology. IMPRESSION: 1. Moderate left hydronephrosis with transition at the left ureteropelvic junction. No stone identified. Underlying stricture or urothelial lesion is not excluded. Urology consult is advised. 2. Sigmoid diverticulosis. No bowel obstruction. Normal appendix. 3. Aortic Atherosclerosis (ICD10-I70.0). Electronically Signed   By: Anner Crete M.D.   On: 11/25/2020 01:18   DG Chest 2 View  Result Date: 11/24/2020 CLINICAL DATA:  Hypoxia. EXAM: CHEST - 2 VIEW COMPARISON:  Chest radiograph dated 05/30/2017. FINDINGS: Postsurgical changes of right upper lobe. No consolidative changes there is no pleural effusion pneumothorax. The cardiac silhouette is within limits. Atherosclerotic calcification of the aorta. No acute osseous pathology. IMPRESSION: No acute cardiopulmonary process. Electronically Signed   By: Anner Crete M.D.   On: 11/24/2020 23:33     Assessment/Plan Principal Problem:   Acute pyelonephritis Active Problems:   GERD (gastroesophageal reflux disease)   Benign essential hypertension   Panlobular emphysema (HCC)   Hypoxia   Acute lower UTI   Hydronephrosis of left kidney    Patient is an 85 year old female who is being admitted to the hospital for acute pyelonephritis and left-sided hydronephrosis.    Acute pyelonephritis Patient presents for evaluation of left lower quadrant/left lumbar pain with CVA tenderness associated with chills She has pyuria as well as leukocytosis Will place patient on Rocephin 1 g IV daily Follow-up results of urine culture    Left-sided hydronephrosis Of an unclear etiology We will request urology consult     Dehydration Most likely secondary to GI  loss from nausea and vomiting At baseline patient has a serum creatinine of 0.7 and today on admission it is 1.20 Continue IV fluid hydration Supportive care with antiemetics and IV PPI Repeat renal parameters in am   Hypoxia Patient was found to have room air pulse oximetry of 85% and is currently on 4 L of oxygen to maintain pulse oximetry of about 94% She has a history of  COPD but does not appear to be acutely exacerbated at this time Place patient on as needed bronchodilator therapy Continue inhaled steroids She will need to be assessed for home oxygen need prior to discharge     Hypertension Blood pressure is stable Continue atenolol   DVT prophylaxis: Lovenox  Code Status: full code  Family Communication: Greater than 50% of time was spent discussing patient's condition and plan of care with her and her son at the bedside. All questions and concerns have been addressed.  They verbalize understanding and agree with the plan. Disposition Plan: Back to previous home environment Consults called: Urology Status:At the time of admission, it appears that the appropriate admission status for this patient is inpatient. This is judged to be reasonable and necessary to provide the required intensity of service to ensure the patient's safety given the presenting symptoms, physical exam findings, and initial radiographic and laboratory data in the context of their comorbid conditions. Patient requires inpatient status due to high intensity of service, high risk for further deterioration and high frequency of surveillance required.     Collier Bullock MD [facility]     11/25/2020, 9:57 AM

## 2020-11-25 NOTE — ED Notes (Signed)
Spoke with Dr. Damita Dunnings on phone re: pt's blood pressures, new orders received. Pt is alert and talking stating that she feels fine.  Primary RN Urban Gibson in room as well and aware of new orders.

## 2020-11-25 NOTE — ED Notes (Signed)
Date and time results received: 11/25/20 2109  (use smartphrase ".now" to insert current time)  Test: lactic acid  Critical Value: 4.7  Name of Provider Notified: Dr. Damita Dunnings  Orders Received? Or Actions Taken?: n/a

## 2020-11-25 NOTE — Progress Notes (Addendum)
PHARMACY - PHYSICIAN COMMUNICATION CRITICAL VALUE ALERT - BLOOD CULTURE IDENTIFICATION (BCID)  Heather [name] is an 85 y.o. female who presented to [facility] on 11/25/2020 with a chief complaint of acute pyelonephritis  Assessment:  1/4 bottles GNR so far. BCID detected E coli. No resistance genes detected. Suspect urinary source.   Name of physician (or Provider) Contacted: Dr. Damita Dunnings  Current antibiotics: Cefepime  Changes to prescribed antibiotics recommended:  Narrow to ceftriaxone  Results for orders placed or performed during the hospital encounter of 11/25/20  Blood Culture ID Panel (Reflexed) (Collected: 11/25/2020  3:00 AM)  Result Value Ref Range   Enterococcus faecalis NOT DETECTED NOT DETECTED   Enterococcus Faecium NOT DETECTED NOT DETECTED   Listeria monocytogenes NOT DETECTED NOT DETECTED   Staphylococcus species NOT DETECTED NOT DETECTED   Staphylococcus aureus (BCID) NOT DETECTED NOT DETECTED   Staphylococcus epidermidis NOT DETECTED NOT DETECTED   Staphylococcus lugdunensis NOT DETECTED NOT DETECTED   Streptococcus species NOT DETECTED NOT DETECTED   Streptococcus agalactiae NOT DETECTED NOT DETECTED   Streptococcus pneumoniae NOT DETECTED NOT DETECTED   Streptococcus pyogenes NOT DETECTED NOT DETECTED   A.calcoaceticus-baumannii NOT DETECTED NOT DETECTED   Bacteroides fragilis NOT DETECTED NOT DETECTED   Enterobacterales DETECTED (A) NOT DETECTED   Enterobacter cloacae complex NOT DETECTED NOT DETECTED   Escherichia coli DETECTED (A) NOT DETECTED   Klebsiella aerogenes NOT DETECTED NOT DETECTED   Klebsiella oxytoca NOT DETECTED NOT DETECTED   Klebsiella pneumoniae NOT DETECTED NOT DETECTED   Proteus species NOT DETECTED NOT DETECTED   Salmonella species NOT DETECTED NOT DETECTED   Serratia marcescens NOT DETECTED NOT DETECTED   Haemophilus influenzae NOT DETECTED NOT DETECTED   Neisseria meningitidis NOT DETECTED NOT DETECTED   Pseudomonas aeruginosa NOT  DETECTED NOT DETECTED   Stenotrophomonas maltophilia NOT DETECTED NOT DETECTED   Candida albicans NOT DETECTED NOT DETECTED   Candida auris NOT DETECTED NOT DETECTED   Candida glabrata NOT DETECTED NOT DETECTED   Candida krusei NOT DETECTED NOT DETECTED   Candida parapsilosis NOT DETECTED NOT DETECTED   Candida tropicalis NOT DETECTED NOT DETECTED   Cryptococcus neoformans/gattii NOT DETECTED NOT DETECTED   CTX-M ESBL NOT DETECTED NOT DETECTED   Carbapenem resistance IMP NOT DETECTED NOT DETECTED   Carbapenem resistance KPC NOT DETECTED NOT DETECTED   Carbapenem resistance NDM NOT DETECTED NOT DETECTED   Carbapenem resist OXA 48 LIKE NOT DETECTED NOT DETECTED   Carbapenem resistance VIM NOT DETECTED NOT DETECTED    Benita Gutter 11/25/2020  10:44 PM

## 2020-11-25 NOTE — Progress Notes (Signed)
Pharmacy Antibiotic Note  Heather [name] is a 85 y.o. female admitted on 11/25/2020. Pharmacy has been consulted for cefepime dosing for UTI/sepsis.  Plan: Cefepime 2 g IV q24h  Height: 5\' 3"  (160 cm) Weight: 61.2 kg (135 lb) IBW/kg (Calculated) : 52.4  Temp (24hrs), Avg:99.1 F (37.3 C), Min:98.2 F (36.8 C), Max:100 F (37.8 C)  Recent Labs  Lab 11/24/20 2320 11/25/20 0300  WBC 13.1*  --   CREATININE 1.20*  --   LATICACIDVEN  --  1.2    Estimated Creatinine Clearance: 29.4 mL/min (A) (by C-G formula based on SCr of 1.2 mg/dL (H)).    Allergies  Allergen Reactions   Ace Inhibitors Swelling   Contrast Media [Iodinated Diagnostic Agents]    Cortisone     Patient had facial swelling and itching from cortisone injection IM    Antimicrobials this admission: Ceftriaxone 11/1 x 1 Cefepime 11/1 >>   Microbiology results: 11/1 BCx: NG pending 11/1 UCx: pending    Thank you for allowing pharmacy to be a part of this patient's care.  Tawnya Crook, PharmD, BCPS Clinical Pharmacist 11/25/2020 8:01 PM

## 2020-11-26 ENCOUNTER — Encounter: Payer: Self-pay | Admitting: Urology

## 2020-11-26 DIAGNOSIS — N12 Tubulo-interstitial nephritis, not specified as acute or chronic: Secondary | ICD-10-CM

## 2020-11-26 DIAGNOSIS — N179 Acute kidney failure, unspecified: Secondary | ICD-10-CM | POA: Diagnosis not present

## 2020-11-26 DIAGNOSIS — A419 Sepsis, unspecified organism: Secondary | ICD-10-CM

## 2020-11-26 LAB — CBC
HCT: 34.1 % — ABNORMAL LOW (ref 36.0–46.0)
Hemoglobin: 11.7 g/dL — ABNORMAL LOW (ref 12.0–15.0)
MCH: 35.5 pg — ABNORMAL HIGH (ref 26.0–34.0)
MCHC: 34.3 g/dL (ref 30.0–36.0)
MCV: 103.3 fL — ABNORMAL HIGH (ref 80.0–100.0)
Platelets: 97 10*3/uL — ABNORMAL LOW (ref 150–400)
RBC: 3.3 MIL/uL — ABNORMAL LOW (ref 3.87–5.11)
RDW: 13.2 % (ref 11.5–15.5)
WBC: 15.8 10*3/uL — ABNORMAL HIGH (ref 4.0–10.5)
nRBC: 0 % (ref 0.0–0.2)

## 2020-11-26 LAB — BASIC METABOLIC PANEL
Anion gap: 7 (ref 5–15)
BUN: 35 mg/dL — ABNORMAL HIGH (ref 8–23)
CO2: 22 mmol/L (ref 22–32)
Calcium: 7.8 mg/dL — ABNORMAL LOW (ref 8.9–10.3)
Chloride: 107 mmol/L (ref 98–111)
Creatinine, Ser: 1.95 mg/dL — ABNORMAL HIGH (ref 0.44–1.00)
GFR, Estimated: 25 mL/min — ABNORMAL LOW (ref >60)
Glucose, Bld: 104 mg/dL — ABNORMAL HIGH (ref 70–99)
Potassium: 4.3 mmol/L (ref 3.5–5.1)
Sodium: 136 mmol/L (ref 135–145)

## 2020-11-26 LAB — LACTIC ACID, PLASMA: Lactic Acid, Venous: 1.8 mmol/L (ref 0.5–1.9)

## 2020-11-26 MED ORDER — SODIUM CHLORIDE 0.9 % IV SOLN: INTRAVENOUS | Status: DC | PRN

## 2020-11-26 MED ORDER — CHLORHEXIDINE GLUCONATE CLOTH 2 % EX PADS
6.0000 | MEDICATED_PAD | Freq: Every day | CUTANEOUS | Status: DC
  Administered 2020-11-26: 6 via TOPICAL

## 2020-11-26 NOTE — Progress Notes (Addendum)
Urology Inpatient Progress Note  Subjective: Patient developed signs and symptoms of sepsis late last night with hypotension, elevated lactate, and uptrending creatinine.  She is now s/p left ureteral stent placement with Dr. Bernardo Heater with intraoperative findings of pyonephrosis. She is afebrile this morning and hypotension is resolving.  Now on room air. WBC count up this morning, 15.8.  Creatinine down, 1.95.  Lactate normalized, 1.8.  Blood and urine cultures preliminarily growing E. coli.  On antibiotics as below.  Diflucan discontinued. Foley catheter in place draining cloudy, amber urine. Patient reports mild headache today but overall feels significantly improved following stent placement.  She denies any flank or abdominal pain.  Anti-infectives: Anti-infectives (From admission, onward)    Start     Dose/Rate Route Frequency Ordered Stop   11/26/20 0600  cefTRIAXone (ROCEPHIN) 2 g in sodium chloride 0.9 % 100 mL IVPB        2 g 200 mL/hr over 30 Minutes Intravenous Every 24 hours 11/25/20 2254     11/26/20 0000  cefTRIAXone (ROCEPHIN) 1 g in sodium chloride 0.9 % 100 mL IVPB  Status:  Discontinued        1 g 200 mL/hr over 30 Minutes Intravenous Every 24 hours 11/25/20 0956 11/25/20 1943   11/25/20 2000  ceFEPIme (MAXIPIME) 2 g in sodium chloride 0.9 % 100 mL IVPB  Status:  Discontinued        2 g 200 mL/hr over 30 Minutes Intravenous Every 24 hours 11/25/20 1952 11/25/20 1955   11/25/20 2000  ceFEPIme (MAXIPIME) 2 g in sodium chloride 0.9 % 100 mL IVPB  Status:  Discontinued        2 g 200 mL/hr over 30 Minutes Intravenous Every 24 hours 11/25/20 1955 11/25/20 2250   11/25/20 1615  fluconazole (DIFLUCAN) tablet 150 mg        150 mg Oral  Once 11/25/20 1605 11/25/20 1622   11/25/20 0500  cefTRIAXone (ROCEPHIN) 1 g in sodium chloride 0.9 % 100 mL IVPB        1 g 200 mL/hr over 30 Minutes Intravenous  Once 11/25/20 0447 11/25/20 0552       Current Facility-Administered  Medications  Medication Dose Route Frequency Provider Last Rate Last Admin   0.9 %  sodium chloride infusion   Intravenous PRN Shawna Clamp, MD 10 mL/hr at 11/26/20 0645 New Bag at 11/26/20 0645   acetaminophen (TYLENOL) tablet 650 mg  650 mg Oral Q6H PRN Stoioff, Scott C, MD   650 mg at 11/25/20 1236   atenolol (TENORMIN) tablet 50 mg  50 mg Oral QHS Stoioff, Scott C, MD       cefTRIAXone (ROCEPHIN) 2 g in sodium chloride 0.9 % 100 mL IVPB  2 g Intravenous Q24H Stoioff, Scott C, MD 200 mL/hr at 11/26/20 0647 2 g at 11/26/20 3149   Chlorhexidine Gluconate Cloth 2 % PADS 6 each  6 each Topical Daily Stoioff, Scott C, MD       cholecalciferol (VITAMIN D3) tablet 1,000 Units  1,000 Units Oral Daily Stoioff, Scott C, MD   1,000 Units at 11/25/20 1527   enoxaparin (LOVENOX) injection 30 mg  30 mg Subcutaneous Q24H Stoioff, Scott C, MD       fluticasone (FLONASE) 50 MCG/ACT nasal spray 1 spray  1 spray Each Nare Daily Stoioff, Scott C, MD   1 spray at 11/25/20 1618   ipratropium-albuterol (DUONEB) 0.5-2.5 (3) MG/3ML nebulizer solution 3 mL  3 mL Nebulization Q6H PRN Stoioff, Scott  C, MD       loratadine (CLARITIN) tablet 10 mg  10 mg Oral Daily Stoioff, Scott C, MD   10 mg at 11/25/20 1527   mometasone-formoterol (DULERA) 100-5 MCG/ACT inhaler 2 puff  2 puff Inhalation BID Stoioff, Scott C, MD   2 puff at 11/25/20 1616   morphine 2 MG/ML injection 2 mg  2 mg Intravenous Q4H PRN Stoioff, Scott C, MD   2 mg at 11/25/20 1212   ondansetron (ZOFRAN) tablet 4 mg  4 mg Oral Q6H PRN Stoioff, Scott C, MD       Or   ondansetron (ZOFRAN) injection 4 mg  4 mg Intravenous Q6H PRN Stoioff, Scott C, MD       Oyster Shell Calcium/D 500-5 MG-MCG TABS 1 tablet  1 tablet Oral Q breakfast Stoioff, Scott C, MD       pantoprazole (PROTONIX) injection 40 mg  40 mg Intravenous Q24H Stoioff, Scott C, MD   40 mg at 11/25/20 1212   simvastatin (ZOCOR) tablet 20 mg  20 mg Oral Daily Stoioff, Scott C, MD   20 mg at 11/25/20 1527    vitamin B-12 (CYANOCOBALAMIN) tablet 100 mcg  100 mcg Oral Daily Stoioff, Scott C, MD   100 mcg at 11/25/20 1618   Facility-Administered Medications Ordered in Other Encounters  Medication Dose Route Frequency Provider Last Rate Last Admin   0.9 %  sodium chloride infusion   Intravenous Continuous Manya Silvas, MD       Objective: Vital signs in last 24 hours: Temp:  [97.7 F (36.5 C)-100 F (37.8 C)] 98.1 F (36.7 C) (11/02 0747) Pulse Rate:  [77-86] 82 (11/02 0747) Resp:  [16-22] 18 (11/02 0747) BP: (67-118)/(43-64) 116/60 (11/02 0747) SpO2:  [91 %-97 %] 95 % (11/02 0747) Weight:  [67 kg] 67 kg (11/02 0546)  Intake/Output from previous day: 11/01 0701 - 11/02 0700 In: 1100 [I.V.:1100] Out: 625 [Urine:625] Intake/Output this shift: No intake/output data recorded.  Physical Exam Vitals and nursing note reviewed.  Constitutional:      General: She is not in acute distress.    Appearance: She is not ill-appearing, toxic-appearing or diaphoretic.  HENT:     Head: Normocephalic and atraumatic.  Pulmonary:     Effort: Pulmonary effort is normal. No respiratory distress.  Skin:    General: Skin is warm and dry.  Neurological:     Mental Status: She is alert and oriented to person, place, and time.  Psychiatric:        Mood and Affect: Mood normal.        Behavior: Behavior normal.   Lab Results:  Recent Labs    11/25/20 2016 11/26/20 0151  WBC 11.3* 15.8*  HGB 11.6* 11.7*  HCT 35.1* 34.1*  PLT 110* 97*   BMET Recent Labs    11/25/20 2016 11/26/20 0151  NA 137 136  K 3.2* 4.3  CL 104 107  CO2 24 22  GLUCOSE 110* 104*  BUN 28* 35*  CREATININE 2.22* 1.95*  CALCIUM 7.9* 7.8*   PT/INR Recent Labs    11/25/20 2016  LABPROT 20.4*  INR 1.7*   Assessment & Plan: 85 year old female with a chronic left UPJ obstruction initially admitted with acute left pyelonephritis who subsequently developed concerning signs for sepsis, now s/p left ureteral stent  placement with Dr. Bernardo Heater.  Patient is clinically improving today and tolerating her stent well.  I recommend keeping her Foley catheter in place until tomorrow morning.  If  she continues to clinically improve, okay to discontinue Foley catheter at that time.  We discussed common stent symptoms including flank pain, bladder pain, gross hematuria, dysuria, urgency, frequency, and leakage.  If she develops any of these, okay to start Flomax and oxybutynin for symptom control.  Recommendations: -Continue to trend CBC, BMP -Continue empiric antibiotics and follow urine cultures.  She will require a total of 14 days of culture appropriate therapy -Manage stent symptoms as above if they develop -Discontinue Foley catheter tomorrow morning if she continues to clinically improve -Outpatient follow-up with Dr. Bernardo Heater in 3 to 4 weeks to discuss stent management  Debroah Loop, PA-C 11/26/2020

## 2020-11-26 NOTE — Op Note (Signed)
Preoperative diagnosis:  Left hydronephrosis/UPJ obstruction Sepsis  Postoperative diagnosis:  Left pyonephrosis  Procedure:  Cystoscopy Left ureteral stent placement (61F/22 cm)  Left retrograde pyelography with interpretation Intraoperative fluoroscopy < 30 minutes  Surgeon: Nicki Reaper C. Neela Zecca, M.D.  Anesthesia: MAC  Complications: None  Intraoperative findings:  Cystoscopy: Patchy erythematous areas endoscopically consistent with cystitis. Thick purulent urine aspirated left renal pelvis Left retrograde pyelogram: Severe hydronephrosis/UPJ obstruction  EBL: Minimal  Specimens: Urine left renal pelvis for culture  Indication: Heather [name] is a 85 y.o. patient with history of a chronic left UPJ obstruction admitted early this morning with UTI.  This evening she became hypotensive and lactate 4.7 after reviewing the management options for treatment, he elected to proceed with the above surgical procedure(s). We have discussed the potential benefits and risks of the procedure, side effects of the proposed treatment, the likelihood of the patient achieving the goals of the procedure, and any potential problems that might occur during the procedure or recuperation. Informed consent has been obtained.  Description of procedure:  The patient was taken to the operating room and sedation was obtained by anesthesia.  The patient was placed in the dorsal lithotomy position, prepped and draped in the usual sterile fashion, and preoperative antibiotics were administered. A preoperative time-out was performed.   A 21 French cystoscope sheath with obturator was lubricated and passed per urethra.  The 30 degree lens was then placed in the sheath and panendoscopy was performed with findings as described above.  Attention then turned to the left ureteral orifice and a 69F ureteral was positioned at the left UO.  A 0.038 sensor wire was then placed through the catheter and into the left ureter and  was advanced into the renal pelvis under fluoroscopic guidance.  There was some difficulty and advancing the wire to the renal pelvis however a 0.038 Zip wire was easily advanced into the renal pelvis.  The catheter was advanced over the wire to the renal pelvis and 10 cc of thick, purulent urine was aspirated and sent for culture.  Contrast was injected through the ureteral catheter and a retrograde pyelogram was performed with findings as dictated above.  The guidewire was replaced and the ureteral catheter was removed.  A 61F/22 cm Contour ureteral stent was then placed under fluoroscopic guidance with a good curl noted in the renal pelvis and distal and well positioned in the bladder.  The cystoscope was repassed and there was brisk efflux of purulent urine seen from the stent.  A 16 French Foley catheter was placed to gravity drainage.  The patient was then transferred to the PACU in guarded condition.   Plan: Indwelling Foley catheter until she is improving clinically.

## 2020-11-26 NOTE — Progress Notes (Signed)
PROGRESS NOTE    CASILDA PICKERILL  ZOX:[id number] DOB: 16-Jul-1935 DOA: 11/25/2020 PCP: Rusty Aus, MD   Brief Narrative:  This 85 years old female with PMH significant for hypertension, GERD, paroxysmal atrial tachycardia, history of brain aneurysm presented to the ED for the evaluation of abdominal pain which started about 2 weeks ago and got worse and last 2 days.  CT abdomen pelvis showed moderate left hydronephrosis with transition at the left ureteropelvic junction.  Urology was consulted.  Patient started on antibiotics. Patient becomes septic overnight with hypotension, elevated lactate and worsening kidney functions.  Patient is now s/p left ureteral stent placement.  Hypotension is improving lactate has normalized.  Blood and urine cultures growing E. coli.  Urology is following.  Assessment & Plan:   Principal Problem:   Acute pyelonephritis Active Problems:   GERD (gastroesophageal reflux disease)   Benign essential hypertension   Panlobular emphysema (HCC)   Hypoxia   Acute lower UTI   Hydronephrosis of left kidney  Severe sepsis secondary to acute pyelonephritis: Patient was admitted for acute pyelonephritis and started on IV ceftriaxone. Overnight patient became septic with hypotension, elevated lactic acid and worsening serum creatinine.  Urology was consulted.  Patient underwent cystoscopy with left ureteral stent placement. Now blood pressure is improving, lactic acid normalized. Continue IV hydration.  Continue antibiotics. Follow-up urine and blood cultures. She will require 14 days of culture appropriate antibiotics. Consider discontinue Foley catheter in the morning if she continues to clinically improve.  Left-sided hydronephrosis: Urology is following.  Patient is now status post stent placement.  Dehydration: It could be due to GI loss from nausea and vomiting. Serum creatinine has improved.  Continue IV hydration.  Essential hypertension: Blood  pressure is stable.  Continue atenolol.  Hyperlipidemia: Continue Lipitor   DVT prophylaxis: Lovenox Code Status: Full code. Family Communication: No family at bed side. Disposition Plan:    Status is: Inpatient  Remains inpatient appropriate because: Sepsis secondary to pyelonephritis.  Patient underwent ureteral stent placement.  Requiring IV antibiotics.   Anticipated discharge home in 1 to 2 days.  Consultants:  Urology  Procedures: Stent placement.  Antimicrobials:  Anti-infectives (From admission, onward)    Start     Dose/Rate Route Frequency Ordered Stop   11/26/20 0600  cefTRIAXone (ROCEPHIN) 2 g in sodium chloride 0.9 % 100 mL IVPB        2 g 200 mL/hr over 30 Minutes Intravenous Every 24 hours 11/25/20 2254     11/26/20 0000  cefTRIAXone (ROCEPHIN) 1 g in sodium chloride 0.9 % 100 mL IVPB  Status:  Discontinued        1 g 200 mL/hr over 30 Minutes Intravenous Every 24 hours 11/25/20 0956 11/25/20 1943   11/25/20 2000  ceFEPIme (MAXIPIME) 2 g in sodium chloride 0.9 % 100 mL IVPB  Status:  Discontinued        2 g 200 mL/hr over 30 Minutes Intravenous Every 24 hours 11/25/20 1952 11/25/20 1955   11/25/20 2000  ceFEPIme (MAXIPIME) 2 g in sodium chloride 0.9 % 100 mL IVPB  Status:  Discontinued        2 g 200 mL/hr over 30 Minutes Intravenous Every 24 hours 11/25/20 1955 11/25/20 2250   11/25/20 1615  fluconazole (DIFLUCAN) tablet 150 mg        150 mg Oral  Once 11/25/20 1605 11/25/20 1622   11/25/20 0500  cefTRIAXone (ROCEPHIN) 1 g in sodium chloride 0.9 % 100 mL IVPB  1 g 200 mL/hr over 30 Minutes Intravenous  Once 11/25/20 0447 11/25/20 0552       Subjective: Patient was seen and examined at bedside.  Overnight events noted.  Patient reports feeling much improved.  Patient underwent s/p stent placement.  Tolerated well.  Objective: Vitals:   11/26/20 0521 11/26/20 0546 11/26/20 0747 11/26/20 1113  BP: 118/64  116/60 120/70  Pulse: 79  82 77   Resp: 18  18 18   Temp: 98.5 F (36.9 C)  98.1 F (36.7 C) 98.2 F (36.8 C)  TempSrc: Oral     SpO2: 96%  95% 98%  Weight:  67 kg    Height:        Intake/Output Summary (Last 24 hours) at 11/26/2020 1437 Last data filed at 11/26/2020 1035 Gross per 24 hour  Intake 1580 ml  Output 625 ml  Net 955 ml   Filed Weights   11/24/20 2300 11/26/20 0546  Weight: 61.2 kg 67 kg    Examination:  General exam: Appears comfortable, not in any acute distress. Respiratory system: Clear to auscultation. Respiratory effort normal. Cardiovascular system: S1 & S2 heard, regular rate and rhythm, no murmur . Gastrointestinal system: Abdominal soft, nontender, nondistended, BS +. Left flank discomfort. Central nervous system: Alert and oriented. No focal neurological deficits. Extremities: No edema, no cyanosis, no clubbing. Skin: No rashes, lesions or ulcers Psychiatry: Judgement and insight appear normal. Mood & affect appropriate.     Data Reviewed: I have personally reviewed following labs and imaging studies  CBC: Recent Labs  Lab 11/24/20 2320 11/25/20 2016 11/26/20 0151  WBC 13.1* 11.3* 15.8*  NEUTROABS  --  9.8*  --   HGB 14.4 11.6* 11.7*  HCT 42.1 35.1* 34.1*  MCV 101.0* 102.6* 103.3*  PLT 154 110* 97*   Basic Metabolic Panel: Recent Labs  Lab 11/24/20 2320 11/25/20 2016 11/26/20 0151  NA 139 137 136  K 3.8 3.2* 4.3  CL 104 104 107  CO2 23 24 22   GLUCOSE 138* 110* 104*  BUN 23 28* 35*  CREATININE 1.20* 2.22* 1.95*  CALCIUM 9.3 7.9* 7.8*   GFR: Estimated Creatinine Clearance: 20.1 mL/min (A) (by C-G formula based on SCr of 1.95 mg/dL (H)). Liver Function Tests: Recent Labs  Lab 11/24/20 2320 11/25/20 2016  AST 19 28  ALT 14 13  ALKPHOS 44 44  BILITOT 0.7 0.8  PROT 6.5 5.1*  ALBUMIN 3.8 2.9*   Recent Labs  Lab 11/24/20 2320  LIPASE 35   No results for input(s): AMMONIA in the last 168 hours. Coagulation Profile: Recent Labs  Lab 11/25/20 2016   INR 1.7*   Cardiac Enzymes: No results for input(s): CKTOTAL, CKMB, CKMBINDEX, TROPONINI in the last 168 hours. BNP (last 3 results) No results for input(s): PROBNP in the last 8760 hours. HbA1C: No results for input(s): HGBA1C in the last 72 hours. CBG: No results for input(s): GLUCAP in the last 168 hours. Lipid Profile: No results for input(s): CHOL, HDL, LDLCALC, TRIG, CHOLHDL, LDLDIRECT in the last 72 hours. Thyroid Function Tests: No results for input(s): TSH, T4TOTAL, FREET4, T3FREE, THYROIDAB in the last 72 hours. Anemia Panel: No results for input(s): VITAMINB12, FOLATE, FERRITIN, TIBC, IRON, RETICCTPCT in the last 72 hours. Sepsis Labs: Recent Labs  Lab 11/25/20 0300 11/25/20 0500 11/25/20 2016 11/26/20 0151  PROCALCITON  --  2.21 29.89  --   LATICACIDVEN 1.2  --  4.7* 1.8    Recent Results (from the past 240 hour(s))  Resp  Panel by RT-PCR (Flu A&B, Covid) Nasopharyngeal Swab     Status: None   Collection Time: 11/25/20 12:19 AM   Specimen: Nasopharyngeal Swab; Nasopharyngeal(NP) swabs in vial transport medium  Result Value Ref Range Status   SARS Coronavirus 2 by RT PCR NEGATIVE NEGATIVE Final    Comment: (NOTE) SARS-CoV-2 target nucleic acids are NOT DETECTED.  The SARS-CoV-2 RNA is generally detectable in upper respiratory specimens during the acute phase of infection. The lowest concentration of SARS-CoV-2 viral copies this assay can detect is 138 copies/mL. A negative result does not preclude SARS-Cov-2 infection and should not be used as the sole basis for treatment or other patient management decisions. A negative result may occur with  improper specimen collection/handling, submission of specimen other than nasopharyngeal swab, presence of viral mutation(s) within the areas targeted by this assay, and inadequate number of viral copies(<138 copies/mL). A negative result must be combined with clinical observations, patient history, and  epidemiological information. The expected result is Negative.  Fact Sheet for Patients:  EntrepreneurPulse.com.au  Fact Sheet for Healthcare Providers:  IncredibleEmployment.be  This test is no t yet approved or cleared by the [place] FDA and  has been authorized for detection and/or diagnosis of SARS-CoV-2 by FDA under an Emergency Use Authorization (EUA). This EUA will remain  in effect (meaning this test can be used) for the duration of the COVID-19 declaration under Section 564(b)(1) of the Act, 21 U.S.C.section 360bbb-3(b)(1), unless the authorization is terminated  or revoked sooner.       Influenza A by PCR NEGATIVE NEGATIVE Final   Influenza B by PCR NEGATIVE NEGATIVE Final    Comment: (NOTE) The Xpert Xpress SARS-CoV-2/FLU/RSV plus assay is intended as an aid in the diagnosis of influenza from Nasopharyngeal swab specimens and should not be used as a sole basis for treatment. Nasal washings and aspirates are unacceptable for Xpert Xpress SARS-CoV-2/FLU/RSV testing.  Fact Sheet for Patients: EntrepreneurPulse.com.au  Fact Sheet for Healthcare Providers: IncredibleEmployment.be  This test is not yet approved or cleared by the [place] FDA and has been authorized for detection and/or diagnosis of SARS-CoV-2 by FDA under an Emergency Use Authorization (EUA). This EUA will remain in effect (meaning this test can be used) for the duration of the COVID-19 declaration under Section 564(b)(1) of the Act, 21 U.S.C. section 360bbb-3(b)(1), unless the authorization is terminated or revoked.  Performed at [facility], [address] [place], [place] [address]   Culture, blood (routine x 2)     Status: None (Preliminary result)   Collection Time: 11/25/20  3:00 AM   Specimen: BLOOD  Result Value Ref Range Status   Specimen Description   Final    BLOOD RIGHT ASSIST  CONTROL Performed at [facility], [address] [place], [place] [address]    Special Requests   Final    BOTTLES DRAWN AEROBIC AND ANAEROBIC Blood Culture results may not be optimal due to an excessive volume of blood received in culture bottles Performed at [facility], [address] [place], [place] [address]    Culture  Setup Time   Final    GRAM NEGATIVE RODS AEROBIC BOTTLE ONLY Organism ID to follow CRITICAL RESULT CALLED TO, READ BACK BY AND VERIFIED WITH: ALEX CHAPPELL PHARMD 2229 11/25/20 HNM    Culture GRAM NEGATIVE RODS  Final   Report Status PENDING  Incomplete  Culture, blood (routine x 2)     Status: None (Preliminary result)   Collection Time:  11/25/20  3:00 AM   Specimen: BLOOD  Result Value Ref Range Status   Specimen Description BLOOD RIGHT WRIST  Final   Special Requests   Final    BOTTLES DRAWN AEROBIC AND ANAEROBIC Blood Culture results may not be optimal due to an excessive volume of blood received in culture bottles   Culture   Final    NO GROWTH 1 DAY Performed at [facility], [address] [place], [place] [address]    Report Status PENDING  Incomplete  Blood Culture ID Panel (Reflexed)     Status: Abnormal   Collection Time: 11/25/20  3:00 AM  Result Value Ref Range Status   Enterococcus faecalis NOT DETECTED NOT DETECTED Final   Enterococcus Faecium NOT DETECTED NOT DETECTED Final   Listeria monocytogenes NOT DETECTED NOT DETECTED Final   Staphylococcus species NOT DETECTED NOT DETECTED Final   Staphylococcus aureus (BCID) NOT DETECTED NOT DETECTED Final   Staphylococcus epidermidis NOT DETECTED NOT DETECTED Final   Staphylococcus lugdunensis NOT DETECTED NOT DETECTED Final   Streptococcus species NOT DETECTED NOT DETECTED Final   Streptococcus agalactiae NOT DETECTED NOT DETECTED Final   Streptococcus pneumoniae NOT DETECTED NOT DETECTED Final   Streptococcus pyogenes NOT DETECTED NOT DETECTED Final    A.calcoaceticus-baumannii NOT DETECTED NOT DETECTED Final   Bacteroides fragilis NOT DETECTED NOT DETECTED Final   Enterobacterales DETECTED (A) NOT DETECTED Final    Comment: Enterobacterales represent a large order of gram negative bacteria, not a single organism. CRITICAL RESULT CALLED TO, READ BACK BY AND VERIFIED WITH: ALEX CHAPPELL PHARMD 2229 11/25/20 HNM    Enterobacter cloacae complex NOT DETECTED NOT DETECTED Final   Escherichia coli DETECTED (A) NOT DETECTED Final    Comment: CRITICAL RESULT CALLED TO, READ BACK BY AND VERIFIED WITH: ALEX CHAPPELL PHARMD 2229 11/25/20 HNM    Klebsiella aerogenes NOT DETECTED NOT DETECTED Final   Klebsiella oxytoca NOT DETECTED NOT DETECTED Final   Klebsiella pneumoniae NOT DETECTED NOT DETECTED Final   Proteus species NOT DETECTED NOT DETECTED Final   Salmonella species NOT DETECTED NOT DETECTED Final   Serratia marcescens NOT DETECTED NOT DETECTED Final   Haemophilus influenzae NOT DETECTED NOT DETECTED Final   Neisseria meningitidis NOT DETECTED NOT DETECTED Final   Pseudomonas aeruginosa NOT DETECTED NOT DETECTED Final   Stenotrophomonas maltophilia NOT DETECTED NOT DETECTED Final   Candida albicans NOT DETECTED NOT DETECTED Final   Candida auris NOT DETECTED NOT DETECTED Final   Candida glabrata NOT DETECTED NOT DETECTED Final   Candida krusei NOT DETECTED NOT DETECTED Final   Candida parapsilosis NOT DETECTED NOT DETECTED Final   Candida tropicalis NOT DETECTED NOT DETECTED Final   Cryptococcus neoformans/gattii NOT DETECTED NOT DETECTED Final   CTX-M ESBL NOT DETECTED NOT DETECTED Final   Carbapenem resistance IMP NOT DETECTED NOT DETECTED Final   Carbapenem resistance KPC NOT DETECTED NOT DETECTED Final   Carbapenem resistance NDM NOT DETECTED NOT DETECTED Final   Carbapenem resist OXA 48 LIKE NOT DETECTED NOT DETECTED Final   Carbapenem resistance VIM NOT DETECTED NOT DETECTED Final    Comment: Performed at [facility],  [address] [place], [place] [address]  Urine Culture     Status: Abnormal (Preliminary result)   Collection Time: 11/25/20  3:09 AM   Specimen: Urine, Clean Catch  Result Value Ref Range Status   Specimen Description   Final    URINE, CLEAN CATCH Performed at [facility], [address] [place],  [place] [address]    Special Requests   Final    NONE Performed at [facility], [address] [place], [place] [address]    Culture >=100,000 COLONIES/mL ESCHERICHIA COLI (A)  Final   Report Status PENDING  Incomplete  Culture, blood (x 2)     Status: None (Preliminary result)   Collection Time: 11/25/20  8:17 PM   Specimen: BLOOD  Result Value Ref Range Status   Specimen Description BLOOD RIGHT ANTECUBITAL  Final   Special Requests   Final    BOTTLES DRAWN AEROBIC AND ANAEROBIC Blood Culture adequate volume   Culture   Final    NO GROWTH < 12 HOURS Performed at [facility], [address], [place], [place] [address]    Report Status PENDING  Incomplete  Culture, blood (x 2)     Status: None (Preliminary result)   Collection Time: 11/25/20  8:26 PM   Specimen: BLOOD  Result Value Ref Range Status   Specimen Description BLOOD BLOOD LEFT FOREARM  Final   Special Requests   Final    BOTTLES DRAWN AEROBIC AND ANAEROBIC Blood Culture adequate volume   Culture   Final    NO GROWTH < 12 HOURS Performed at [facility], [address] [place], [place] [address]    Report Status PENDING  Incomplete   Radiology Studies: CT Abdomen Pelvis Wo Contrast  Result Date: 11/25/2020 CLINICAL DATA:  Abdominal pain.  Concern for acute diverticulitis. EXAM: CT ABDOMEN AND PELVIS WITHOUT CONTRAST TECHNIQUE: Multidetector CT imaging of the abdomen and pelvis was performed following the standard protocol without IV contrast. COMPARISON:  CT abdomen pelvis dated 08/23/2013 FINDINGS: Evaluation of this exam is limited in the absence of intravenous contrast.  Lower chest: Bibasilar linear atelectasis/scarring. There is a focus of calcification at the left lung base associated with linear scarring. No intra-abdominal free air or free fluid. Hepatobiliary: Small hepatic hypodense lesions are not characterized on this CT. No intrahepatic biliary ductal dilatation. The gallbladder is unremarkable. Pancreas: Unremarkable. No pancreatic ductal dilatation or surrounding inflammatory changes. Spleen: Normal in size without focal abnormality. Adrenals/Urinary Tract: The adrenal glands are unremarkable. There is moderate left hydronephrosis. No stone identified. A transition is noted at the left ureteropelvic junction. Underlying stricture or urothelial lesion is not excluded. Urology consult is advised. There is left perinephric stranding. Correlation with urinalysis recommended to exclude superimposed UTI. There is no hydronephrosis or nephrolithiasis on the right. The right ureter and urinary bladder appear unremarkable. Stomach/Bowel: There is sigmoid diverticulosis without active inflammatory changes. There is no bowel obstruction or active inflammation. There is a small hiatal hernia. The appendix is normal. Vascular/Lymphatic: Advanced aortoiliac atherosclerotic disease. There is a 2.1 cm infrarenal aortic ectasia. The IVC is unremarkable. No portal venous gas. There is no adenopathy. Reproductive: The uterus is anteverted and grossly unremarkable. No adnexal masses. Other: None Musculoskeletal: Degenerative changes of the spine. No acute osseous pathology. IMPRESSION: 1. Moderate left hydronephrosis with transition at the left ureteropelvic junction. No stone identified. Underlying stricture or urothelial lesion is not excluded. Urology consult is advised. 2. Sigmoid diverticulosis. No bowel obstruction. Normal appendix. 3. Aortic Atherosclerosis (ICD10-I70.0). Electronically Signed   By: Anner Crete M.D.   On: 11/25/2020 01:18   DG Chest 2 View  Result Date:  11/24/2020 CLINICAL DATA:  Hypoxia. EXAM: CHEST - 2 VIEW COMPARISON:  Chest radiograph dated 05/30/2017. FINDINGS: Postsurgical changes of right upper lobe. No consolidative changes there is no pleural effusion pneumothorax.  The cardiac silhouette is within limits. Atherosclerotic calcification of the aorta. No acute osseous pathology. IMPRESSION: No acute cardiopulmonary process. Electronically Signed   By: Anner Crete M.D.   On: 11/24/2020 23:33   DG Chest Port 1 View  Result Date: 11/25/2020 CLINICAL DATA:  85 year old female with shortness of breath and abdominal pain. EXAM: PORTABLE CHEST - 1 VIEW COMPARISON:  11/24/2020, 09/11/2020 FINDINGS: The mediastinal contours are within normal limits. No cardiomegaly. Atherosclerotic calcification of the aortic arch. Postsurgical changes after right upper left lower wedge resections without complicating features. No focal consolidation, pleural effusion, or pneumothorax. No acute osseous abnormality. IMPRESSION: 1. No acute cardiopulmonary process. 2. Postsurgical changes after right upper left lower wedge resections without complicating features. 3.  Aortic Atherosclerosis (ICD10-I70.0). Electronically Signed   By: Ruthann Cancer M.D.   On: 11/25/2020 10:35   DG OR UROLOGY CYSTO IMAGE ([facility] ONLY)  Result Date: 11/25/2020 There is no interpretation for this exam.  This order is for images obtained during a surgical procedure.  Please See "Surgeries" Tab for more information regarding the procedure.    Scheduled Meds:  atenolol  50 mg Oral QHS   Chlorhexidine Gluconate Cloth  6 each Topical Daily   cholecalciferol  1,000 Units Oral Daily   enoxaparin (LOVENOX) injection  30 mg Subcutaneous Q24H   fluticasone  1 spray Each Nare Daily   loratadine  10 mg Oral Daily   mometasone-formoterol  2 puff Inhalation BID   Oyster Shell Calcium/D  1 tablet Oral Q breakfast   pantoprazole (PROTONIX) IV  40 mg Intravenous Q24H   simvastatin  20 mg Oral Daily    vitamin B-12  100 mcg Oral Daily   Continuous Infusions:  sodium chloride 10 mL/hr at 11/26/20 0645   cefTRIAXone (ROCEPHIN)  IV 2 g (11/26/20 0647)     LOS: 1 day    Time spent: 35 mins    Dorie Ohms, MD [facility]   If 7PM-7AM, please contact night-coverage

## 2020-11-26 NOTE — Progress Notes (Signed)
CROSS COVERAGE EVENT NOTE    Heather [name]  VHQ:[id number] DOB: 12-Jul-1935 DOA: 11/25/2020 PCP: Rusty Aus, MD  Event:   Patient hypotensive to 70/44, not tachycardic, mentating well and appears comfortable  Brief summary: Patient admitted earlier with acute left pyelonephritis and noticed to have left UPJ obstruction without evidence of sepsis - She was evaluated by urology who recommended continuation of empiric Rocephin with the addition of Diflucan due to yeast seen on UA with the recommendations that if failure of clinical improvement or decompensation to contact urology (per urology note)   Subjective: Patient awake and alert but looks acutely ill.  Mentating well.  Denies pain at this time  Objective: Vitals:   11/26/20 0020 11/26/20 0026 11/26/20 0030 11/26/20 0032  BP: (!) 96/50 (!) 89/47 (!) 85/54 (!) 89/54  Pulse: 79 79 77 77  Resp: 20 20 19 20   Temp:   98.2 F (36.8 C)   TempSrc:      SpO2: 92% 94% 95% 96%  Weight:      Height:        Physical Exam Vitals and nursing note reviewed.  Constitutional:      Appearance: She is ill-appearing.  Cardiovascular:     Rate and Rhythm: Normal rate and regular rhythm.     Heart sounds: Normal heart sounds.  Pulmonary:     Effort: Pulmonary effort is normal.     Breath sounds: Normal breath sounds.  Abdominal:     General: Bowel sounds are normal.     Palpations: Abdomen is soft.     Tenderness: There is no abdominal tenderness.  Skin:    General: Skin is warm and dry.  Neurological:     General: No focal deficit present.     Mental Status: She is alert and oriented to person, place, and time.    Assessment & Plan:   Principal Problem:   Acute pyelonephritis Active Problems:   GERD (gastroesophageal reflux disease)   Benign essential hypertension   Panlobular emphysema (HCC)   Hypoxia   Acute lower UTI   Hydronephrosis of left kidney   Assessment/plan  Severe hypotension in the setting of  complicated UTI Possible sepsis Probable - LR bolus followed by LR at maintenance of 125 mL/h and assess BP response - Switch ceftriaxone to cefepime given complicated UTI - Sepsis panel ordered -(!) 70/44.  If blood pressure fails to respond, will transfer to stepdown/ICU for pressors and will contact urology for consideration of urgent procedure  This plan was communicated to charge nurse in the ED with patient is being held.  Orders have been placed.  Addendum: Patient did not respond to fluid challenge.  Sepsis work-up resulting with a lactic acid of 4.7, WBC 11,000 procalcitonin of 29Up from 2.21 earlier  Assessment:  Septic shock secondary to complicated UTI, failing fluid challenge  Plan: Continue IV fluid resuscitation Spoke with Dr. Bernardo Heater of urology will come in to do cystoscopy   CRITICAL CARE Performed by: Athena Masse  Total critical care time: 75 minutes  Critical care time was exclusive of separately billable procedures and treating other patients.  Critical care was necessary to treat or prevent imminent or life-threatening deterioration.  Critical care was time spent personally by me on the following activities: development of treatment plan with patient and/or surrogate as well as nursing, discussions with consultants, evaluation of patient's response to treatment, examination of patient, obtaining history from patient or surrogate, ordering and performing treatments and interventions,  ordering and review of laboratory studies, ordering and review of radiographic studies, pulse oximetry and re-evaluation of patient's condition.   Data Reviewed: I have personally reviewed following labs and imaging studies  CBC: Recent Labs  Lab 11/24/20 2320 11/25/20 2016  WBC 13.1* 11.3*  NEUTROABS  --  9.8*  HGB 14.4 11.6*  HCT 42.1 35.1*  MCV 101.0* 102.6*  PLT 154 295*   Basic Metabolic Panel: Recent Labs  Lab 11/24/20 2320 11/25/20 2016  NA 139 137  K  3.8 3.2*  CL 104 104  CO2 23 24  GLUCOSE 138* 110*  BUN 23 28*  CREATININE 1.20* 2.22*  CALCIUM 9.3 7.9*   GFR: Estimated Creatinine Clearance: 15.9 mL/min (A) (by C-G formula based on SCr of 2.22 mg/dL (H)). Liver Function Tests: Recent Labs  Lab 11/24/20 2320 11/25/20 2016  AST 19 28  ALT 14 13  ALKPHOS 44 44  BILITOT 0.7 0.8  PROT 6.5 5.1*  ALBUMIN 3.8 2.9*   Recent Labs  Lab 11/24/20 2320  LIPASE 35   No results for input(s): AMMONIA in the last 168 hours. Coagulation Profile: Recent Labs  Lab 11/25/20 2016  INR 1.7*   Cardiac Enzymes: No results for input(s): CKTOTAL, CKMB, CKMBINDEX, TROPONINI in the last 168 hours. BNP (last 3 results) No results for input(s): PROBNP in the last 8760 hours. HbA1C: No results for input(s): HGBA1C in the last 72 hours. CBG: No results for input(s): GLUCAP in the last 168 hours. Lipid Profile: No results for input(s): CHOL, HDL, LDLCALC, TRIG, CHOLHDL, LDLDIRECT in the last 72 hours. Thyroid Function Tests: No results for input(s): TSH, T4TOTAL, FREET4, T3FREE, THYROIDAB in the last 72 hours. Anemia Panel: No results for input(s): VITAMINB12, FOLATE, FERRITIN, TIBC, IRON, RETICCTPCT in the last 72 hours. Urine analysis:    Component Value Date/Time   COLORURINE YELLOW (A) 11/25/2020 0309   APPEARANCEUR CLOUDY (A) 11/25/2020 0309   LABSPEC 1.020 11/25/2020 0309   PHURINE 5.0 11/25/2020 0309   GLUCOSEU NEGATIVE 11/25/2020 0309   HGBUR LARGE (A) 11/25/2020 0309   BILIRUBINUR NEGATIVE 11/25/2020 0309   KETONESUR 20 (A) 11/25/2020 0309   PROTEINUR 100 (A) 11/25/2020 0309   NITRITE POSITIVE (A) 11/25/2020 0309   LEUKOCYTESUR LARGE (A) 11/25/2020 0309   Sepsis Labs: @LABRCNTIP (procalcitonin:4,lacticidven:4)  ) Recent Results (from the past 240 hour(s))  Resp Panel by RT-PCR (Flu A&B, Covid) Nasopharyngeal Swab     Status: None   Collection Time: 11/25/20 12:19 AM   Specimen: Nasopharyngeal Swab; Nasopharyngeal(NP)  swabs in vial transport medium  Result Value Ref Range Status   SARS Coronavirus 2 by RT PCR NEGATIVE NEGATIVE Final    Comment: (NOTE) SARS-CoV-2 target nucleic acids are NOT DETECTED.  The SARS-CoV-2 RNA is generally detectable in upper respiratory specimens during the acute phase of infection. The lowest concentration of SARS-CoV-2 viral copies this assay can detect is 138 copies/mL. A negative result does not preclude SARS-Cov-2 infection and should not be used as the sole basis for treatment or other patient management decisions. A negative result may occur with  improper specimen collection/handling, submission of specimen other than nasopharyngeal swab, presence of viral mutation(s) within the areas targeted by this assay, and inadequate number of viral copies(<138 copies/mL). A negative result must be combined with clinical observations, patient history, and epidemiological information. The expected result is Negative.  Fact Sheet for Patients:  EntrepreneurPulse.com.au  Fact Sheet for Healthcare Providers:  IncredibleEmployment.be  This test is no t yet approved or cleared by  the [employer] and  has been authorized for detection and/or diagnosis of SARS-CoV-2 by FDA under an Emergency Use Authorization (EUA). This EUA will remain  in effect (meaning this test can be used) for the duration of the COVID-19 declaration under Section 564(b)(1) of the Act, 21 U.S.C.section 360bbb-3(b)(1), unless the authorization is terminated  or revoked sooner.       Influenza A by PCR NEGATIVE NEGATIVE Final   Influenza B by PCR NEGATIVE NEGATIVE Final    Comment: (NOTE) The Xpert Xpress SARS-CoV-2/FLU/RSV plus assay is intended as an aid in the diagnosis of influenza from Nasopharyngeal swab specimens and should not be used as a sole basis for treatment. Nasal washings and aspirates are unacceptable for Xpert Xpress  SARS-CoV-2/FLU/RSV testing.  Fact Sheet for Patients: EntrepreneurPulse.com.au  Fact Sheet for Healthcare Providers: IncredibleEmployment.be  This test is not yet approved or cleared by the [place] FDA and has been authorized for detection and/or diagnosis of SARS-CoV-2 by FDA under an Emergency Use Authorization (EUA). This EUA will remain in effect (meaning this test can be used) for the duration of the COVID-19 declaration under Section 564(b)(1) of the Act, 21 U.S.C. section 360bbb-3(b)(1), unless the authorization is terminated or revoked.  Performed at [facility], [address] [place], [place] [address]   Culture, blood (routine x 2)     Status: None (Preliminary result)   Collection Time: 11/25/20  3:00 AM   Specimen: BLOOD  Result Value Ref Range Status   Specimen Description BLOOD RIGHT ASSIST CONTROL  Final   Special Requests   Final    BOTTLES DRAWN AEROBIC AND ANAEROBIC Blood Culture results may not be optimal due to an excessive volume of blood received in culture bottles   Culture  Setup Time   Final    GRAM NEGATIVE RODS AEROBIC BOTTLE ONLY Organism ID to follow CRITICAL RESULT CALLED TO, READ BACK BY AND VERIFIED WITH: ALEX CHAPPELL PHARMD 2229 11/25/20 HNM Performed at [facility], [address], [place], [place] [address]    Culture GRAM NEGATIVE RODS  Final   Report Status PENDING  Incomplete  Culture, blood (routine x 2)     Status: None (Preliminary result)   Collection Time: 11/25/20  3:00 AM   Specimen: BLOOD  Result Value Ref Range Status   Specimen Description BLOOD RIGHT WRIST  Final   Special Requests   Final    BOTTLES DRAWN AEROBIC AND ANAEROBIC Blood Culture results may not be optimal due to an excessive volume of blood received in culture bottles   Culture   Final    NO GROWTH < 12 HOURS Performed at [facility], [address] [place], [place] [address]     Report Status PENDING  Incomplete  Blood Culture ID Panel (Reflexed)     Status: Abnormal   Collection Time: 11/25/20  3:00 AM  Result Value Ref Range Status   Enterococcus faecalis NOT DETECTED NOT DETECTED Final   Enterococcus Faecium NOT DETECTED NOT DETECTED Final   Listeria monocytogenes NOT DETECTED NOT DETECTED Final   Staphylococcus species NOT DETECTED NOT DETECTED Final   Staphylococcus aureus (BCID) NOT DETECTED NOT DETECTED Final   Staphylococcus epidermidis NOT DETECTED NOT DETECTED Final   Staphylococcus lugdunensis NOT DETECTED NOT DETECTED Final   Streptococcus species NOT DETECTED NOT DETECTED Final   Streptococcus agalactiae NOT DETECTED NOT DETECTED Final   Streptococcus pneumoniae NOT DETECTED NOT DETECTED Final   Streptococcus pyogenes NOT DETECTED NOT  DETECTED Final   A.calcoaceticus-baumannii NOT DETECTED NOT DETECTED Final   Bacteroides fragilis NOT DETECTED NOT DETECTED Final   Enterobacterales DETECTED (A) NOT DETECTED Final    Comment: Enterobacterales represent a large order of gram negative bacteria, not a single organism. CRITICAL RESULT CALLED TO, READ BACK BY AND VERIFIED WITH: ALEX CHAPPELL PHARMD 2229 11/25/20 HNM    Enterobacter cloacae complex NOT DETECTED NOT DETECTED Final   Escherichia coli DETECTED (A) NOT DETECTED Final    Comment: CRITICAL RESULT CALLED TO, READ BACK BY AND VERIFIED WITH: ALEX CHAPPELL PHARMD 2229 11/25/20 HNM    Klebsiella aerogenes NOT DETECTED NOT DETECTED Final   Klebsiella oxytoca NOT DETECTED NOT DETECTED Final   Klebsiella pneumoniae NOT DETECTED NOT DETECTED Final   Proteus species NOT DETECTED NOT DETECTED Final   Salmonella species NOT DETECTED NOT DETECTED Final   Serratia marcescens NOT DETECTED NOT DETECTED Final   Haemophilus influenzae NOT DETECTED NOT DETECTED Final   Neisseria meningitidis NOT DETECTED NOT DETECTED Final   Pseudomonas aeruginosa NOT DETECTED NOT DETECTED Final   Stenotrophomonas maltophilia  NOT DETECTED NOT DETECTED Final   Candida albicans NOT DETECTED NOT DETECTED Final   Candida auris NOT DETECTED NOT DETECTED Final   Candida glabrata NOT DETECTED NOT DETECTED Final   Candida krusei NOT DETECTED NOT DETECTED Final   Candida parapsilosis NOT DETECTED NOT DETECTED Final   Candida tropicalis NOT DETECTED NOT DETECTED Final   Cryptococcus neoformans/gattii NOT DETECTED NOT DETECTED Final   CTX-M ESBL NOT DETECTED NOT DETECTED Final   Carbapenem resistance IMP NOT DETECTED NOT DETECTED Final   Carbapenem resistance KPC NOT DETECTED NOT DETECTED Final   Carbapenem resistance NDM NOT DETECTED NOT DETECTED Final   Carbapenem resist OXA 48 LIKE NOT DETECTED NOT DETECTED Final   Carbapenem resistance VIM NOT DETECTED NOT DETECTED Final    Comment: Performed at [facility], [address] [place], [place] [address]      Radiology Studies: CT Abdomen Pelvis Wo Contrast  Result Date: 11/25/2020 CLINICAL DATA:  Abdominal pain.  Concern for acute diverticulitis. EXAM: CT ABDOMEN AND PELVIS WITHOUT CONTRAST TECHNIQUE: Multidetector CT imaging of the abdomen and pelvis was performed following the standard protocol without IV contrast. COMPARISON:  CT abdomen pelvis dated 08/23/2013 FINDINGS: Evaluation of this exam is limited in the absence of intravenous contrast. Lower chest: Bibasilar linear atelectasis/scarring. There is a focus of calcification at the left lung base associated with linear scarring. No intra-abdominal free air or free fluid. Hepatobiliary: Small hepatic hypodense lesions are not characterized on this CT. No intrahepatic biliary ductal dilatation. The gallbladder is unremarkable. Pancreas: Unremarkable. No pancreatic ductal dilatation or surrounding inflammatory changes. Spleen: Normal in size without focal abnormality. Adrenals/Urinary Tract: The adrenal glands are unremarkable. There is moderate left hydronephrosis. No stone identified. A transition is noted at  the left ureteropelvic junction. Underlying stricture or urothelial lesion is not excluded. Urology consult is advised. There is left perinephric stranding. Correlation with urinalysis recommended to exclude superimposed UTI. There is no hydronephrosis or nephrolithiasis on the right. The right ureter and urinary bladder appear unremarkable. Stomach/Bowel: There is sigmoid diverticulosis without active inflammatory changes. There is no bowel obstruction or active inflammation. There is a small hiatal hernia. The appendix is normal. Vascular/Lymphatic: Advanced aortoiliac atherosclerotic disease. There is a 2.1 cm infrarenal aortic ectasia. The IVC is unremarkable. No portal venous gas. There is no adenopathy. Reproductive: The uterus is anteverted and grossly unremarkable. No adnexal masses. Other: None Musculoskeletal:  Degenerative changes of the spine. No acute osseous pathology. IMPRESSION: 1. Moderate left hydronephrosis with transition at the left ureteropelvic junction. No stone identified. Underlying stricture or urothelial lesion is not excluded. Urology consult is advised. 2. Sigmoid diverticulosis. No bowel obstruction. Normal appendix. 3. Aortic Atherosclerosis (ICD10-I70.0). Electronically Signed   By: Anner Crete M.D.   On: 11/25/2020 01:18   DG Chest 2 View  Result Date: 11/24/2020 CLINICAL DATA:  Hypoxia. EXAM: CHEST - 2 VIEW COMPARISON:  Chest radiograph dated 05/30/2017. FINDINGS: Postsurgical changes of right upper lobe. No consolidative changes there is no pleural effusion pneumothorax. The cardiac silhouette is within limits. Atherosclerotic calcification of the aorta. No acute osseous pathology. IMPRESSION: No acute cardiopulmonary process. Electronically Signed   By: Anner Crete M.D.   On: 11/24/2020 23:33   DG Chest Port 1 View  Result Date: 11/25/2020 CLINICAL DATA:  85 year old female with shortness of breath and abdominal pain. EXAM: PORTABLE CHEST - 1 VIEW COMPARISON:   11/24/2020, 09/11/2020 FINDINGS: The mediastinal contours are within normal limits. No cardiomegaly. Atherosclerotic calcification of the aortic arch. Postsurgical changes after right upper left lower wedge resections without complicating features. No focal consolidation, pleural effusion, or pneumothorax. No acute osseous abnormality. IMPRESSION: 1. No acute cardiopulmonary process. 2. Postsurgical changes after right upper left lower wedge resections without complicating features. 3.  Aortic Atherosclerosis (ICD10-I70.0). Electronically Signed   By: Ruthann Cancer M.D.   On: 11/25/2020 10:35   DG OR UROLOGY CYSTO IMAGE ([facility] ONLY)  Result Date: 11/25/2020 There is no interpretation for this exam.  This order is for images obtained during a surgical procedure.  Please See "Surgeries" Tab for more information regarding the procedure.    Scheduled Meds:  [MAR Hold] atenolol  50 mg Oral QHS   [MAR Hold] cholecalciferol  1,000 Units Oral Daily   [MAR Hold] enoxaparin (LOVENOX) injection  30 mg Subcutaneous Q24H   [MAR Hold] fluticasone  1 spray Each Nare Daily   [MAR Hold] loratadine  10 mg Oral Daily   [MAR Hold] mometasone-formoterol  2 puff Inhalation BID   [MAR Hold] Oyster Shell Calcium/D  1 tablet Oral Q breakfast   [MAR Hold] pantoprazole (PROTONIX) IV  40 mg Intravenous Q24H   [MAR Hold] simvastatin  20 mg Oral Daily   [MAR Hold] vitamin B-12  100 mcg Oral Daily   Continuous Infusions:  cefTRIAXone (ROCEPHIN)  IV     lactated ringers 125 mL/hr at 11/25/20 1956     LOS: 1 day    Time spent: [place], MD [facility]  11/26/2020, 12:35 AM

## 2020-11-26 NOTE — Plan of Care (Signed)
  Problem: Clinical Measurements: Goal: Diagnostic test results will improve Outcome: Completed/Met   Problem: Nutrition: Goal: Adequate nutrition will be maintained Outcome: Completed/Met   Problem: Coping: Goal: Level of anxiety will decrease Outcome: Completed/Met   Problem: Elimination: Goal: Will not experience complications related to bowel motility Outcome: Completed/Met Goal: Will not experience complications related to urinary retention Outcome: Completed/Met   Problem: Pain Managment: Goal: General experience of comfort will improve Outcome: Completed/Met

## 2020-11-26 NOTE — Progress Notes (Signed)
Notified bedside nurse of need to draw repeat lactic acid. 

## 2020-11-26 NOTE — Anesthesia Postprocedure Evaluation (Signed)
Anesthesia Post Note  Patient: Heather [name]  Procedure(s) Performed: CYSTOSCOPY WITH STENT PLACEMENT (Left)  Patient location during evaluation: PACU Anesthesia Type: MAC Level of consciousness: awake and alert Pain management: pain level controlled Vital Signs Assessment: post-procedure vital signs reviewed and stable Respiratory status: spontaneous breathing, nonlabored ventilation, respiratory function stable and patient connected to nasal cannula oxygen Cardiovascular status: stable and blood pressure returned to baseline Postop Assessment: no apparent nausea or vomiting Anesthetic complications: no   No notable events documented.   Last Vitals:  Vitals:   11/26/20 0045 11/26/20 0207  BP: (!) 101/52 (!) 89/56  Pulse: 78 77  Resp: (!) 21 18  Temp:  36.8 C  SpO2: 92% 97%    Last Pain:  Vitals:   11/26/20 0045  TempSrc:   PainSc: 0-No pain                 Arita Miss

## 2020-11-27 ENCOUNTER — Inpatient Hospital Stay: Payer: Medicare HMO

## 2020-11-27 ENCOUNTER — Other Ambulatory Visit: Payer: Self-pay

## 2020-11-27 DIAGNOSIS — R7881 Bacteremia: Secondary | ICD-10-CM

## 2020-11-27 DIAGNOSIS — N136 Pyonephrosis: Secondary | ICD-10-CM

## 2020-11-27 DIAGNOSIS — B9620 Unspecified Escherichia coli [E. coli] as the cause of diseases classified elsewhere: Secondary | ICD-10-CM

## 2020-11-27 DIAGNOSIS — N390 Urinary tract infection, site not specified: Secondary | ICD-10-CM

## 2020-11-27 LAB — BASIC METABOLIC PANEL
Anion gap: 9 (ref 5–15)
BUN: 25 mg/dL — ABNORMAL HIGH (ref 8–23)
CO2: 24 mmol/L (ref 22–32)
Calcium: 8.4 mg/dL — ABNORMAL LOW (ref 8.9–10.3)
Chloride: 105 mmol/L (ref 98–111)
Creatinine, Ser: 1.04 mg/dL — ABNORMAL HIGH (ref 0.44–1.00)
GFR, Estimated: 53 mL/min — ABNORMAL LOW (ref >60)
Glucose, Bld: 74 mg/dL (ref 70–99)
Potassium: 3.6 mmol/L (ref 3.5–5.1)
Sodium: 138 mmol/L (ref 135–145)

## 2020-11-27 LAB — CBC
HCT: 37.1 % (ref 36.0–46.0)
Hemoglobin: 12.2 g/dL (ref 12.0–15.0)
MCH: 33.4 pg (ref 26.0–34.0)
MCHC: 32.9 g/dL (ref 30.0–36.0)
MCV: 101.6 fL — ABNORMAL HIGH (ref 80.0–100.0)
Platelets: 110 10*3/uL — ABNORMAL LOW (ref 150–400)
RBC: 3.65 MIL/uL — ABNORMAL LOW (ref 3.87–5.11)
RDW: 13.1 % (ref 11.5–15.5)
WBC: 14.4 10*3/uL — ABNORMAL HIGH (ref 4.0–10.5)
nRBC: 0 % (ref 0.0–0.2)

## 2020-11-27 LAB — PHOSPHORUS: Phosphorus: 2.8 mg/dL (ref 2.5–4.6)

## 2020-11-27 LAB — URINE CULTURE: Culture: >=100000 — AB

## 2020-11-27 LAB — MAGNESIUM: Magnesium: 1.9 mg/dL (ref 1.7–2.4)

## 2020-11-27 MED ORDER — DILTIAZEM HCL 25 MG/5ML IV SOLN: 5.0000 mg | Freq: Once | INTRAVENOUS | Status: DC

## 2020-11-27 MED ORDER — ENOXAPARIN SODIUM 40 MG/0.4ML IJ SOSY
40.0000 mg | PREFILLED_SYRINGE | INTRAMUSCULAR | Status: DC
  Filled 2020-11-27: qty 0.4

## 2020-11-27 NOTE — Consult Note (Addendum)
NAME: Heather [name]  DOB: 10/23/1935  MRN: [id number]  Date/Time: 11/27/2020 9:27 AM  REQUESTING PROVIDER: Dr.kumar Subjective:  REASON FOR CONSULT: RE.coli bacteremia and pyelo ? Heather [name] is a 85 y.o. female with a history of ca lung left s/p wedge resection, HTN, PAF presented to the ED on 11/24/20 with lower abdominal pain more on the left side , with vomiting. Pt says she had left sided abdominal  pain 3 weeks ago and it resolved in a day- she developed pain on both sides of her abdomen a week ago and thought it was due to her working in the garden bending a lot- it lasted for a few days and then she came to the ED In the ED temp 100, BP 142/113, HR 64 and sats 90% Wbc 13.1, HB 14.4, cr 1.20 There was concern for diverticulitis and CT abdomen revealed  Moderate left hydronephrosis with transition at the left ureteropelvic junction. No stone identified. Underlying stricture or urothelial lesion is not excluded.Sigmoid diverticulosis. No bowel obstruction. Normal appendix.Advanced aortoiliac atherosclerotic disease. There is a 2.1 cm infrarenal aortic ectasia. 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 I am seeing the patient for e.coli bacteremia and complicated  UTI  She has no h/o renal stones She is feeling better now  Past Medical History:  Diagnosis Date   Brain aneurysm    Cancer [facility]) lung   2016   Carotid artery stenosis    Carotid stenosis 06/19/2013   Overview:  Normal study, 1/16   Colonic polyp    GERD (gastroesophageal reflux disease)    Hiatal hernia    Hypercholesteremia    Hyperlipidemia, mixed 06/09/2015   Hypertension    Lung nodule 06/21/2014   Macrocytic 10/06/2014   Medicare annual wellness visit, initial 07/08/2016   Overview:  6/18   PAT  (paroxysmal atrial tachycardia) ([place]) 06/19/2013   Plantar fasciitis    Tachycardia, paroxysmal ([place]) 03/31/14    Past Surgical History:  Procedure Laterality Date   BREAST EXCISIONAL BIOPSY Right 40 yrs ago   neg   BREAST LUMPECTOMY     benign   CATARACT EXTRACTION W/ INTRAOCULAR LENS  IMPLANT, BILATERAL Bilateral    CEREBRAL ANEURYSM REPAIR  2004   CERVIX LESION DESTRUCTION     COLONOSCOPY WITH PROPOFOL N/A 03/31/2015   Procedure: COLONOSCOPY WITH PROPOFOL;  Surgeon: Manya Silvas, MD;  Location: [facility] ENDOSCOPY;  Service: Endoscopy;  Laterality: N/A;   CYSTOSCOPY WITH STENT PLACEMENT Left 11/25/2020   Procedure: CYSTOSCOPY WITH STENT PLACEMENT;  Surgeon: Abbie Sons, MD;  Location: [facility] ORS;  Service: Urology;  Laterality: Left;   FLEXIBLE BRONCHOSCOPY N/A 03/10/2017   Procedure: FLEXIBLE BRONCHOSCOPY;  Surgeon: Nestor Lewandowsky, MD;  Location: [facility] ORS;  Service: Thoracic;  Laterality: N/A;   THORACOTOMY/LOBECTOMY Left 03/10/2017   Procedure: THORACOTOMY WITH LUNG WEDGE RESECTION POSSIBLE LOBECTOMY;  Surgeon: Nestor Lewandowsky, MD;  Location: [facility] ORS;  Service: Thoracic;  Laterality: Left;   TUBAL LIGATION      Social History   Socioeconomic History   Marital status: Married    Spouse name: Not on file   Number of children: Not on file   Years of education: Not on file   Highest education level: Not on file  Occupational History   Not on  file  Tobacco Use   Smoking status: Former    Years: 35.00    Types: Cigarettes    Quit date: 06/10/1988    Years since quitting: 32.4   Smokeless tobacco: Never  Vaping Use   Vaping Use: Never used  Substance and Sexual Activity   Alcohol use: Yes    Alcohol/week: 0.0 standard drinks    Comment: glass of wine daily   Drug use: No   Sexual activity: Not on file  Other Topics Concern   Not on file  Social History Narrative   Not on file   Social Determinants of Health   Financial Resource Strain: Not on file  Food Insecurity: Not on  file  Transportation Needs: Not on file  Physical Activity: Not on file  Stress: Not on file  Social Connections: Not on file  Intimate Partner Violence: Not on file    Family History  Problem Relation Age of Onset   Alcohol abuse Mother    Heart attack Father    Breast cancer Neg Hx   Sister died of brain aneurysm Dad died of stroke Allergies  Allergen Reactions   Ace Inhibitors Swelling   Contrast Media [Iodinated Diagnostic Agents]    Cortisone     Patient had facial swelling and itching from cortisone injection IM   I? Current Facility-Administered Medications  Medication Dose Route Frequency Provider Last Rate Last Admin   0.9 %  sodium chloride infusion   Intravenous PRN Shawna Clamp, MD   Stopped at 11/26/20 1008   acetaminophen (TYLENOL) tablet 650 mg  650 mg Oral Q6H PRN Stoioff, Scott C, MD   650 mg at 11/25/20 1236   atenolol (TENORMIN) tablet 50 mg  50 mg Oral QHS Stoioff, Scott C, MD   50 mg at 11/26/20 2148   cefTRIAXone (ROCEPHIN) 2 g in sodium chloride 0.9 % 100 mL IVPB  2 g Intravenous Q24H Stoioff, Scott C, MD 200 mL/hr at 11/27/20 0555 2 g at 11/27/20 0555   Chlorhexidine Gluconate Cloth 2 % PADS 6 each  6 each Topical Daily Stoioff, Scott C, MD   6 each at 11/26/20 1013   cholecalciferol (VITAMIN D3) tablet 1,000 Units  1,000 Units Oral Daily Stoioff, Scott C, MD   1,000 Units at 11/26/20 1009   diltiazem (CARDIZEM) injection 5 mg  5 mg Intravenous Once Mansy, Jan A, MD       enoxaparin (LOVENOX) injection 30 mg  30 mg Subcutaneous Q24H Stoioff, Scott C, MD   30 mg at 11/26/20 1010   fluticasone (FLONASE) 50 MCG/ACT nasal spray 1 spray  1 spray Each Nare Daily Stoioff, Scott C, MD   1 spray at 11/26/20 1011   ipratropium-albuterol (DUONEB) 0.5-2.5 (3) MG/3ML nebulizer solution 3 mL  3 mL Nebulization Q6H PRN Stoioff, Scott C, MD       loratadine (CLARITIN) tablet 10 mg  10 mg Oral Daily Stoioff, Scott C, MD   10 mg at 11/26/20 1009   mometasone-formoterol  (DULERA) 100-5 MCG/ACT inhaler 2 puff  2 puff Inhalation BID Stoioff, Scott C, MD   2 puff at 11/26/20 2039   morphine 2 MG/ML injection 2 mg  2 mg Intravenous Q4H PRN Stoioff, Scott C, MD   2 mg at 11/25/20 1212   ondansetron (ZOFRAN) tablet 4 mg  4 mg Oral Q6H PRN Stoioff, Scott C, MD       Or   ondansetron (ZOFRAN) injection 4 mg  4 mg Intravenous Q6H PRN Stoioff,  Ronda Fairly, MD   4 mg at 11/27/20 0601   Oyster Shell Calcium/D 500-5 MG-MCG TABS 1 tablet  1 tablet Oral Q breakfast Stoioff, Ronda Fairly, MD   1 tablet at 11/26/20 1013   pantoprazole (PROTONIX) injection 40 mg  40 mg Intravenous Q24H Stoioff, Scott C, MD   40 mg at 11/26/20 1010   simvastatin (ZOCOR) tablet 20 mg  20 mg Oral Daily Stoioff, Scott C, MD   20 mg at 11/26/20 1009   vitamin B-12 (CYANOCOBALAMIN) tablet 100 mcg  100 mcg Oral Daily Stoioff, Scott C, MD   100 mcg at 11/26/20 1012   Facility-Administered Medications Ordered in Other Encounters  Medication Dose Route Frequency Provider Last Rate Last Admin   0.9 %  sodium chloride infusion   Intravenous Continuous Manya Silvas, MD         Abtx:  Anti-infectives (From admission, onward)    Start     Dose/Rate Route Frequency Ordered Stop   11/26/20 0600  cefTRIAXone (ROCEPHIN) 2 g in sodium chloride 0.9 % 100 mL IVPB        2 g 200 mL/hr over 30 Minutes Intravenous Every 24 hours 11/25/20 2254     11/26/20 0000  cefTRIAXone (ROCEPHIN) 1 g in sodium chloride 0.9 % 100 mL IVPB  Status:  Discontinued        1 g 200 mL/hr over 30 Minutes Intravenous Every 24 hours 11/25/20 0956 11/25/20 1943   11/25/20 2000  ceFEPIme (MAXIPIME) 2 g in sodium chloride 0.9 % 100 mL IVPB  Status:  Discontinued        2 g 200 mL/hr over 30 Minutes Intravenous Every 24 hours 11/25/20 1952 11/25/20 1955   11/25/20 2000  ceFEPIme (MAXIPIME) 2 g in sodium chloride 0.9 % 100 mL IVPB  Status:  Discontinued        2 g 200 mL/hr over 30 Minutes Intravenous Every 24 hours 11/25/20 1955 11/25/20 2250    11/25/20 1615  fluconazole (DIFLUCAN) tablet 150 mg        150 mg Oral  Once 11/25/20 1605 11/25/20 1622   11/25/20 0500  cefTRIAXone (ROCEPHIN) 1 g in sodium chloride 0.9 % 100 mL IVPB        1 g 200 mL/hr over 30 Minutes Intravenous  Once 11/25/20 0447 11/25/20 0552       REVIEW OF SYSTEMS:  Const:  fever,  chills, negative weight loss Eyes: negative diplopia or visual changes, negative eye pain ENT: negative coryza, negative sore throat Resp: negative cough, hemoptysis, dyspnea Cards: negative for chest pain, palpitations, lower extremity edema GU: negative for frequency, dysuria and hematuria GI: as above Skin: negative for rash and pruritus Heme: negative for easy bruising and gum/nose bleeding MS: general weakness Neurolo:negative for headaches, dizziness, vertigo, memory problems  Psych: negative for feelings of anxiety, depression  Endocrine: negative for thyroid, diabetes Allergy/Immunology- as above ?  Objective:  VITALS:  BP 117/72 (BP Location: Right Arm)   Pulse 96   Temp 97.8 F (36.6 C) (Oral)   Resp 18   Ht _0  (1.6 m)   Wt 67 kg   SpO2 96%   BMI 26.17 kg/m  PHYSICAL EXAM:  General: Alert, cooperative, no distress, appears stated age.  Head: Normocephalic, without obvious abnormality, atraumatic. Eyes: Conjunctivae clear, anicteric sclerae. Pupils are equal ENT Nares normal. No drainage or sinus tenderness. Lips, mucosa, and tongue normal. No Thrush Neck: Supple, symmetrical, no adenopathy, thyroid: non tender no carotid  bruit and no JVD. Back: No CVA tenderness. Lungs: Clear to auscultation bilaterally. No Wheezing or Rhonchi. No rales. Heart: Regular rate and rhythm, no murmur, rub or gallop. Abdomen: Soft, non-tender,not distended. Bowel sounds normal. No masses Extremities: atraumatic, no cyanosis. No edema. No clubbing Skin: No rashes or lesions. Or bruising Lymph: Cervical, supraclavicular normal. Neurologic: Grossly non-focal Pertinent  Labs Lab Results CBC    Component Value Date/Time   WBC 14.4 (H) 11/27/2020 0541   RBC 3.65 (L) 11/27/2020 0541   HGB 12.2 11/27/2020 0541   HGB 14.8 02/28/2014 0957   HCT 37.1 11/27/2020 0541   HCT 43.7 02/28/2014 0957   PLT 110 (L) 11/27/2020 0541   PLT 176 02/28/2014 0957   MCV 101.6 (H) 11/27/2020 0541   MCV 102 (H) 02/28/2014 0957   MCH 33.4 11/27/2020 0541   MCHC 32.9 11/27/2020 0541   RDW 13.1 11/27/2020 0541   RDW 12.6 02/28/2014 0957   LYMPHSABS 0.8 11/25/2020 2016   LYMPHSABS 1.5 02/28/2014 0957   MONOABS 0.5 11/25/2020 2016   MONOABS 0.4 02/28/2014 0957   EOSABS 0.0 11/25/2020 2016   EOSABS 0.2 02/28/2014 0957   BASOSABS 0.0 11/25/2020 2016   BASOSABS 0.0 02/28/2014 0957    CMP Latest Ref Rng & Units 11/27/2020 11/26/2020 11/25/2020  Glucose 70 - 99 mg/dL 74 104(H) 110(H)  BUN 8 - 23 mg/dL 25(H) 35(H) 28(H)  Creatinine 0.44 - 1.00 mg/dL 1.04(H) 1.95(H) 2.22(H)  Sodium 135 - 145 mmol/L 138 136 137  Potassium 3.5 - 5.1 mmol/L 3.6 4.3 3.2(L)  Chloride 98 - 111 mmol/L 105 107 104  CO2 22 - 32 mmol/L _0 Calcium 8.9 - 10.3 mg/dL 8.4(L) 7.8(L) 7.9(L)  Total Protein 6.5 - 8.1 g/dL - - 5.1(L)  Total Bilirubin 0.3 - 1.2 mg/dL - - 0.8  Alkaline Phos 38 - 126 U/L - - 44  AST 15 - 41 U/L - - 28  ALT 0 - 44 U/L - - 13      Microbiology: Recent Results (from the past 240 hour(s))  Resp Panel by RT-PCR (Flu A&B, Covid) Nasopharyngeal Swab     Status: None   Collection Time: 11/25/20 12:19 AM   Specimen: Nasopharyngeal Swab; Nasopharyngeal(NP) swabs in vial transport medium  Result Value Ref Range Status   SARS Coronavirus 2 by RT PCR NEGATIVE NEGATIVE Final    Comment: (NOTE) SARS-CoV-2 target nucleic acids are NOT DETECTED.  The SARS-CoV-2 RNA is generally detectable in upper respiratory specimens during the acute phase of infection. The lowest concentration of SARS-CoV-2 viral copies this assay can detect is 138 copies/mL. A negative result does not  preclude SARS-Cov-2 infection and should not be used as the sole basis for treatment or other patient management decisions. A negative result may occur with  improper specimen collection/handling, submission of specimen other than nasopharyngeal swab, presence of viral mutation(s) within the areas targeted by this assay, and inadequate number of viral copies(<138 copies/mL). A negative result must be combined with clinical observations, patient history, and epidemiological information. The expected result is Negative.  Fact Sheet for Patients:  EntrepreneurPulse.com.au  Fact Sheet for Healthcare Providers:  IncredibleEmployment.be  This test is no t yet approved or cleared by the [place] FDA and  has been authorized for detection and/or diagnosis of SARS-CoV-2 by FDA under an Emergency Use Authorization (EUA). This EUA will remain  in effect (meaning this test can be used) for the duration of the COVID-19 declaration under Section 564(b)(1) of  the Act, 21 U.S.C.section 360bbb-3(b)(1), unless the authorization is terminated  or revoked sooner.       Influenza A by PCR NEGATIVE NEGATIVE Final   Influenza B by PCR NEGATIVE NEGATIVE Final    Comment: (NOTE) The Xpert Xpress SARS-CoV-2/FLU/RSV plus assay is intended as an aid in the diagnosis of influenza from Nasopharyngeal swab specimens and should not be used as a sole basis for treatment. Nasal washings and aspirates are unacceptable for Xpert Xpress SARS-CoV-2/FLU/RSV testing.  Fact Sheet for Patients: EntrepreneurPulse.com.au  Fact Sheet for Healthcare Providers: IncredibleEmployment.be  This test is not yet approved or cleared by the [place] FDA and has been authorized for detection and/or diagnosis of SARS-CoV-2 by FDA under an Emergency Use Authorization (EUA). This EUA will remain in effect (meaning this test can be used) for the  duration of the COVID-19 declaration under Section 564(b)(1) of the Act, 21 U.S.C. section 360bbb-3(b)(1), unless the authorization is terminated or revoked.  Performed at [facility], [address] [place], [place] [address]   Culture, blood (routine x 2)     Status: Abnormal (Preliminary result)   Collection Time: 11/25/20  3:00 AM   Specimen: BLOOD  Result Value Ref Range Status   Specimen Description   Final    BLOOD RIGHT ASSIST CONTROL Performed at [facility], [address] [place], [place] [address]    Special Requests   Final    BOTTLES DRAWN AEROBIC AND ANAEROBIC Blood Culture results may not be optimal due to an excessive volume of blood received in culture bottles Performed at [facility], [address] [place], [place] [address]    Culture  Setup Time   Final    GRAM NEGATIVE RODS AEROBIC BOTTLE ONLY Organism ID to follow CRITICAL RESULT CALLED TO, READ BACK BY AND VERIFIED WITH: ALEX CHAPPELL PHARMD 2229 11/25/20 HNM    Culture (A)  Final    ESCHERICHIA COLI SUSCEPTIBILITIES TO FOLLOW Performed at [facility], [address], [place], [place] [address]    Report Status PENDING  Incomplete  Culture, blood (routine x 2)     Status: None (Preliminary result)   Collection Time: 11/25/20  3:00 AM   Specimen: BLOOD  Result Value Ref Range Status   Specimen Description BLOOD RIGHT WRIST  Final   Special Requests   Final    BOTTLES DRAWN AEROBIC AND ANAEROBIC Blood Culture results may not be optimal due to an excessive volume of blood received in culture bottles   Culture   Final    NO GROWTH 2 DAYS Performed at [facility], [address] [place], [place] [address]    Report Status PENDING  Incomplete  Blood Culture ID Panel (Reflexed)     Status: Abnormal   Collection Time: 11/25/20  3:00 AM  Result Value Ref Range Status   Enterococcus faecalis NOT DETECTED NOT DETECTED Final   Enterococcus Faecium NOT  DETECTED NOT DETECTED Final   Listeria monocytogenes NOT DETECTED NOT DETECTED Final   Staphylococcus species NOT DETECTED NOT DETECTED Final   Staphylococcus aureus (BCID) NOT DETECTED NOT DETECTED Final   Staphylococcus epidermidis NOT DETECTED NOT DETECTED Final   Staphylococcus lugdunensis NOT DETECTED NOT DETECTED Final   Streptococcus species NOT DETECTED NOT DETECTED Final   Streptococcus agalactiae NOT DETECTED NOT DETECTED Final   Streptococcus pneumoniae NOT DETECTED NOT DETECTED Final   Streptococcus pyogenes NOT DETECTED NOT DETECTED Final   A.calcoaceticus-baumannii NOT DETECTED NOT DETECTED Final  Bacteroides fragilis NOT DETECTED NOT DETECTED Final   Enterobacterales DETECTED (A) NOT DETECTED Final    Comment: Enterobacterales represent a large order of gram negative bacteria, not a single organism. CRITICAL RESULT CALLED TO, READ BACK BY AND VERIFIED WITH: ALEX CHAPPELL PHARMD 2229 11/25/20 HNM    Enterobacter cloacae complex NOT DETECTED NOT DETECTED Final   Escherichia coli DETECTED (A) NOT DETECTED Final    Comment: CRITICAL RESULT CALLED TO, READ BACK BY AND VERIFIED WITH: ALEX CHAPPELL PHARMD 2229 11/25/20 HNM    Klebsiella aerogenes NOT DETECTED NOT DETECTED Final   Klebsiella oxytoca NOT DETECTED NOT DETECTED Final   Klebsiella pneumoniae NOT DETECTED NOT DETECTED Final   Proteus species NOT DETECTED NOT DETECTED Final   Salmonella species NOT DETECTED NOT DETECTED Final   Serratia marcescens NOT DETECTED NOT DETECTED Final   Haemophilus influenzae NOT DETECTED NOT DETECTED Final   Neisseria meningitidis NOT DETECTED NOT DETECTED Final   Pseudomonas aeruginosa NOT DETECTED NOT DETECTED Final   Stenotrophomonas maltophilia NOT DETECTED NOT DETECTED Final   Candida albicans NOT DETECTED NOT DETECTED Final   Candida auris NOT DETECTED NOT DETECTED Final   Candida glabrata NOT DETECTED NOT DETECTED Final   Candida krusei NOT DETECTED NOT DETECTED Final   Candida  parapsilosis NOT DETECTED NOT DETECTED Final   Candida tropicalis NOT DETECTED NOT DETECTED Final   Cryptococcus neoformans/gattii NOT DETECTED NOT DETECTED Final   CTX-M ESBL NOT DETECTED NOT DETECTED Final   Carbapenem resistance IMP NOT DETECTED NOT DETECTED Final   Carbapenem resistance KPC NOT DETECTED NOT DETECTED Final   Carbapenem resistance NDM NOT DETECTED NOT DETECTED Final   Carbapenem resist OXA 48 LIKE NOT DETECTED NOT DETECTED Final   Carbapenem resistance VIM NOT DETECTED NOT DETECTED Final    Comment: Performed at [facility], [address] [place], [place] [address]  Urine Culture     Status: Abnormal   Collection Time: 11/25/20  3:09 AM   Specimen: Urine, Clean Catch  Result Value Ref Range Status   Specimen Description   Final    URINE, CLEAN CATCH Performed at [facility], [address] [place], [place] [address]    Special Requests   Final    NONE Performed at [facility], [address] [place], [place] [address]    Culture >=100,000 COLONIES/mL ESCHERICHIA COLI (A)  Final   Report Status 11/27/2020 FINAL  Final   Organism ID, Bacteria ESCHERICHIA COLI (A)  Final      Susceptibility   Escherichia coli - MIC*    AMPICILLIN <=2 SENSITIVE Sensitive     CEFAZOLIN <=4 SENSITIVE Sensitive     CEFEPIME <=0.12 SENSITIVE Sensitive     CEFTRIAXONE <=0.25 SENSITIVE Sensitive     CIPROFLOXACIN <=0.25 SENSITIVE Sensitive     GENTAMICIN <=1 SENSITIVE Sensitive     IMIPENEM <=0.25 SENSITIVE Sensitive     NITROFURANTOIN <=16 SENSITIVE Sensitive     TRIMETH/SULFA <=20 SENSITIVE Sensitive     AMPICILLIN/SULBACTAM <=2 SENSITIVE Sensitive     PIP/TAZO <=4 SENSITIVE Sensitive     * >=100,000 COLONIES/mL ESCHERICHIA COLI  Culture, blood (x 2)     Status: None (Preliminary result)   Collection Time: 11/25/20  8:17 PM   Specimen: BLOOD  Result Value Ref Range Status   Specimen Description BLOOD RIGHT ANTECUBITAL  Final   Special  Requests   Final    BOTTLES DRAWN AEROBIC AND ANAEROBIC Blood Culture adequate volume   Culture  Final    NO GROWTH 2 DAYS Performed at [facility], [address] [place], [place] [address]    Report Status PENDING  Incomplete  Culture, blood (x 2)     Status: None (Preliminary result)   Collection Time: 11/25/20  8:26 PM   Specimen: BLOOD  Result Value Ref Range Status   Specimen Description BLOOD BLOOD LEFT FOREARM  Final   Special Requests   Final    BOTTLES DRAWN AEROBIC AND ANAEROBIC Blood Culture adequate volume   Culture   Final    NO GROWTH 2 DAYS Performed at [facility], [address], [place], [place] [address]    Report Status PENDING  Incomplete  Urine Culture     Status: Abnormal (Preliminary result)   Collection Time: 11/25/20 11:26 PM   Specimen: PATH Other; Urine  Result Value Ref Range Status   Specimen Description   Final    PELVIS LEFT RENAL PELVIS CYSTOSCOPY Performed at [facility], [place] [place], [place] [address]    Special Requests   Final    NONE Performed at [facility], [place], [place] [address]    Culture 10,000 COLONIES/mL ESCHERICHIA COLI (A)  Final   Report Status PENDING  Incomplete    IMAGING RESULTS: I have personally reviewed the films ? Impression/Recommendation ? ?E.Coli bacteremia secondary to complicated UTI due to left pyoenphrosis fue to PUJ stricture Has a stent E.coli in urine Pt is on IV ceftriaxone Pan sensitive e.coli- Quinolone is a good option on discharge but she has aortic ectasia , so quinolone may be contraindicated. Other option is bactrim, but need to watch closely cr and K, also will do IV ceftrixone till WBC normalizes beofre switching to oral antibiotics She will need a total of 14 days of treatment   AKI on presentation- resolved  HTN on atenolol  Hyperlipidemia on lipitor ___________________________________________________ Discussed  with patient, requesting provider Note:  This document was prepared using Dragon voice recognition software and may include unintentional dictation errors.

## 2020-11-27 NOTE — Progress Notes (Signed)
PROGRESS NOTE    Heather [name]  WFU:[id number] DOB: Dec 09, 1935 DOA: 11/25/2020 PCP: Rusty Aus, MD   Brief Narrative:  This 85 years old female with PMH significant for hypertension, GERD, paroxysmal atrial tachycardia, history of brain aneurysm presented to the ED for the evaluation of abdominal pain which started about 2 weeks ago and got worse and last 2 days.  CT abdomen pelvis showed moderate left hydronephrosis with transition at the left ureteropelvic junction.  Urology was consulted.  Patient started on antibiotics. Patient became septic overnight with hypotension, with elevated lactate and worsening kidney functions.  Patient is now s/p left ureteral stent placement.  Hypotension has resolved. lactate has normalized.  Blood and urine cultures growing E. Coli, pan sensitive. Foley catheter discontinued.  Assessment & Plan:   Principal Problem:   Acute pyelonephritis Active Problems:   GERD (gastroesophageal reflux disease)   Benign essential hypertension   Panlobular emphysema (HCC)   Hypoxia   Acute lower UTI   Hydronephrosis of left kidney  Severe sepsis secondary to acute pyelonephritis: Patient was admitted for acute pyelonephritis and started on IV ceftriaxone. Overnight patient became septic with hypotension, elevated lactic acid and worsening serum creatinine.   Urology was consulted.  Patient underwent cystoscopy with left ureteral stent placement. Now Blood pressure has improved.  lactic acid normalized. Continue IV hydration.  Continue IV ceftriaxone.   Blood cultures no growth so far, urine culture grew E. coli pansensitive. She will require 14 days of culture appropriate antibiotics. Foley catheter discontinued.  Sepsis physiology is improving. Infectious disease consulted for antibiotic regimen.  Left-sided hydronephrosis: Urology is following.  Patient is now status post stent placement. Repeat ultrasound shows improvement in  hydronephrosis.  Dehydration: It could be due to GI loss from nausea and vomiting. Serum creatinine has improved.  Continue IV hydration.  Essential hypertension: Blood pressure is stable.  Continue atenolol.  Hyperlipidemia: Continue Lipitor   DVT prophylaxis: Lovenox Code Status: Full code. Family Communication: No family at bed side. Disposition Plan:    Status is: Inpatient  Remains inpatient appropriate because: Sepsis secondary to pyelonephritis.   Patient underwent ureteral stent placement.  Requiring IV antibiotics.   Anticipated discharge home  11/4.  Consultants:  Urology  Procedures: Stent placement.  Antimicrobials:  Anti-infectives (From admission, onward)    Start     Dose/Rate Route Frequency Ordered Stop   11/26/20 0600  cefTRIAXone (ROCEPHIN) 2 g in sodium chloride 0.9 % 100 mL IVPB        2 g 200 mL/hr over 30 Minutes Intravenous Every 24 hours 11/25/20 2254     11/26/20 0000  cefTRIAXone (ROCEPHIN) 1 g in sodium chloride 0.9 % 100 mL IVPB  Status:  Discontinued        1 g 200 mL/hr over 30 Minutes Intravenous Every 24 hours 11/25/20 0956 11/25/20 1943   11/25/20 2000  ceFEPIme (MAXIPIME) 2 g in sodium chloride 0.9 % 100 mL IVPB  Status:  Discontinued        2 g 200 mL/hr over 30 Minutes Intravenous Every 24 hours 11/25/20 1952 11/25/20 1955   11/25/20 2000  ceFEPIme (MAXIPIME) 2 g in sodium chloride 0.9 % 100 mL IVPB  Status:  Discontinued        2 g 200 mL/hr over 30 Minutes Intravenous Every 24 hours 11/25/20 1955 11/25/20 2250   11/25/20 1615  fluconazole (DIFLUCAN) tablet 150 mg        150 mg Oral  Once 11/25/20 1605  11/25/20 1622   11/25/20 0500  cefTRIAXone (ROCEPHIN) 1 g in sodium chloride 0.9 % 100 mL IVPB        1 g 200 mL/hr over 30 Minutes Intravenous  Once 11/25/20 0447 11/25/20 0552       Subjective: Patient was seen and examined at bedside.  Overnight events noted.   Patient reports feeling much improved.  Her blood pressure  has improved.  She denies any nausea and vomiting.  She reports left flank pain has improved.  Objective: Vitals:   11/26/20 1639 11/26/20 1933 11/27/20 0403 11/27/20 0700  BP: 127/65 128/65 117/64 117/72  Pulse: 84 86 (!) 53 96  Resp: 17 20 18    Temp: 97.9 F (36.6 C) 98.6 F (37 C) (!) 97.4 F (36.3 C) 97.8 F (36.6 C)  TempSrc:    Oral  SpO2: 98% 97% 91% 96%  Weight:      Height:        Intake/Output Summary (Last 24 hours) at 11/27/2020 1358 Last data filed at 11/27/2020 0930 Gross per 24 hour  Intake 128.75 ml  Output 2150 ml  Net -2021.25 ml   Filed Weights   11/24/20 2300 11/26/20 0546  Weight: 61.2 kg 67 kg    Examination:  General exam: Appears comfortable, not in any acute distress. Respiratory system: Clear to auscultation bilaterally. Respiratory effort normal.  RR 15 Cardiovascular system: S1 & S2 heard, regular rate and rhythm, no murmur . Gastrointestinal system: Abdominal soft, nontender, nondistended, BS +.  Left flank discomfort. Central nervous system: Alert and oriented x 3. No focal neurological deficits. Extremities: No edema, no cyanosis, no clubbing. Skin: No rashes, lesions or ulcers Psychiatry: Judgement and insight appear normal. Mood & affect appropriate.     Data Reviewed: I have personally reviewed following labs and imaging studies  CBC: Recent Labs  Lab 11/24/20 2320 11/25/20 2016 11/26/20 0151 11/27/20 0541  WBC 13.1* 11.3* 15.8* 14.4*  NEUTROABS  --  9.8*  --   --   HGB 14.4 11.6* 11.7* 12.2  HCT 42.1 35.1* 34.1* 37.1  MCV 101.0* 102.6* 103.3* 101.6*  PLT 154 110* 97* 734*   Basic Metabolic Panel: Recent Labs  Lab 11/24/20 2320 11/25/20 2016 11/26/20 0151 11/27/20 0541  NA 139 137 136 138  K 3.8 3.2* 4.3 3.6  CL 104 104 107 105  CO2 23 24 22 24   GLUCOSE 138* 110* 104* 74  BUN 23 28* 35* 25*  CREATININE 1.20* 2.22* 1.95* 1.04*  CALCIUM 9.3 7.9* 7.8* 8.4*  MG  --   --   --  1.9  PHOS  --   --   --  2.8    GFR: Estimated Creatinine Clearance: 37.7 mL/min (A) (by C-G formula based on SCr of 1.04 mg/dL (H)). Liver Function Tests: Recent Labs  Lab 11/24/20 2320 11/25/20 2016  AST 19 28  ALT 14 13  ALKPHOS 44 44  BILITOT 0.7 0.8  PROT 6.5 5.1*  ALBUMIN 3.8 2.9*   Recent Labs  Lab 11/24/20 2320  LIPASE 35   No results for input(s): AMMONIA in the last 168 hours. Coagulation Profile: Recent Labs  Lab 11/25/20 2016  INR 1.7*   Cardiac Enzymes: No results for input(s): CKTOTAL, CKMB, CKMBINDEX, TROPONINI in the last 168 hours. BNP (last 3 results) No results for input(s): PROBNP in the last 8760 hours. HbA1C: No results for input(s): HGBA1C in the last 72 hours. CBG: No results for input(s): GLUCAP in the last 168 hours. Lipid  Profile: No results for input(s): CHOL, HDL, LDLCALC, TRIG, CHOLHDL, LDLDIRECT in the last 72 hours. Thyroid Function Tests: No results for input(s): TSH, T4TOTAL, FREET4, T3FREE, THYROIDAB in the last 72 hours. Anemia Panel: No results for input(s): VITAMINB12, FOLATE, FERRITIN, TIBC, IRON, RETICCTPCT in the last 72 hours. Sepsis Labs: Recent Labs  Lab 11/25/20 0300 11/25/20 0500 11/25/20 2016 11/26/20 0151  PROCALCITON  --  2.21 29.89  --   LATICACIDVEN 1.2  --  4.7* 1.8    Recent Results (from the past 240 hour(s))  Resp Panel by RT-PCR (Flu A&B, Covid) Nasopharyngeal Swab     Status: None   Collection Time: 11/25/20 12:19 AM   Specimen: Nasopharyngeal Swab; Nasopharyngeal(NP) swabs in vial transport medium  Result Value Ref Range Status   SARS Coronavirus 2 by RT PCR NEGATIVE NEGATIVE Final    Comment: (NOTE) SARS-CoV-2 target nucleic acids are NOT DETECTED.  The SARS-CoV-2 RNA is generally detectable in upper respiratory specimens during the acute phase of infection. The lowest concentration of SARS-CoV-2 viral copies this assay can detect is 138 copies/mL. A negative result does not preclude SARS-Cov-2 infection and should not be  used as the sole basis for treatment or other patient management decisions. A negative result may occur with  improper specimen collection/handling, submission of specimen other than nasopharyngeal swab, presence of viral mutation(s) within the areas targeted by this assay, and inadequate number of viral copies(<138 copies/mL). A negative result must be combined with clinical observations, patient history, and epidemiological information. The expected result is Negative.  Fact Sheet for Patients:  EntrepreneurPulse.com.au  Fact Sheet for Healthcare Providers:  IncredibleEmployment.be  This test is no t yet approved or cleared by the [place] FDA and  has been authorized for detection and/or diagnosis of SARS-CoV-2 by FDA under an Emergency Use Authorization (EUA). This EUA will remain  in effect (meaning this test can be used) for the duration of the COVID-19 declaration under Section 564(b)(1) of the Act, 21 U.S.C.section 360bbb-3(b)(1), unless the authorization is terminated  or revoked sooner.       Influenza A by PCR NEGATIVE NEGATIVE Final   Influenza B by PCR NEGATIVE NEGATIVE Final    Comment: (NOTE) The Xpert Xpress SARS-CoV-2/FLU/RSV plus assay is intended as an aid in the diagnosis of influenza from Nasopharyngeal swab specimens and should not be used as a sole basis for treatment. Nasal washings and aspirates are unacceptable for Xpert Xpress SARS-CoV-2/FLU/RSV testing.  Fact Sheet for Patients: EntrepreneurPulse.com.au  Fact Sheet for Healthcare Providers: IncredibleEmployment.be  This test is not yet approved or cleared by the [place] FDA and has been authorized for detection and/or diagnosis of SARS-CoV-2 by FDA under an Emergency Use Authorization (EUA). This EUA will remain in effect (meaning this test can be used) for the duration of the COVID-19 declaration under Section  564(b)(1) of the Act, 21 U.S.C. section 360bbb-3(b)(1), unless the authorization is terminated or revoked.  Performed at [facility], [address] [place], [place] [address]   Culture, blood (routine x 2)     Status: Abnormal (Preliminary result)   Collection Time: 11/25/20  3:00 AM   Specimen: BLOOD  Result Value Ref Range Status   Specimen Description   Final    BLOOD RIGHT ASSIST CONTROL Performed at [facility], [address], [place], [place] [address]    Special Requests   Final    BOTTLES DRAWN AEROBIC AND ANAEROBIC Blood Culture results may not be optimal due to  an excessive volume of blood received in culture bottles Performed at [facility], [address] [place], [place] [address]    Culture  Setup Time   Final    GRAM NEGATIVE RODS AEROBIC BOTTLE ONLY Organism ID to follow CRITICAL RESULT CALLED TO, READ BACK BY AND VERIFIED WITH: ALEX CHAPPELL PHARMD 2229 11/25/20 HNM    Culture (A)  Final    ESCHERICHIA COLI SUSCEPTIBILITIES TO FOLLOW Performed at [facility], [place] [address] [place], [place] [address]    Report Status PENDING  Incomplete  Culture, blood (routine x 2)     Status: None (Preliminary result)   Collection Time: 11/25/20  3:00 AM   Specimen: BLOOD  Result Value Ref Range Status   Specimen Description BLOOD RIGHT WRIST  Final   Special Requests   Final    BOTTLES DRAWN AEROBIC AND ANAEROBIC Blood Culture results may not be optimal due to an excessive volume of blood received in culture bottles   Culture   Final    NO GROWTH 2 DAYS Performed at [facility], [address] [place], [place] [address]    Report Status PENDING  Incomplete  Blood Culture ID Panel (Reflexed)     Status: Abnormal   Collection Time: 11/25/20  3:00 AM  Result Value Ref Range Status   Enterococcus faecalis NOT DETECTED NOT DETECTED Final   Enterococcus Faecium NOT DETECTED NOT DETECTED Final   Listeria monocytogenes  NOT DETECTED NOT DETECTED Final   Staphylococcus species NOT DETECTED NOT DETECTED Final   Staphylococcus aureus (BCID) NOT DETECTED NOT DETECTED Final   Staphylococcus epidermidis NOT DETECTED NOT DETECTED Final   Staphylococcus lugdunensis NOT DETECTED NOT DETECTED Final   Streptococcus species NOT DETECTED NOT DETECTED Final   Streptococcus agalactiae NOT DETECTED NOT DETECTED Final   Streptococcus pneumoniae NOT DETECTED NOT DETECTED Final   Streptococcus pyogenes NOT DETECTED NOT DETECTED Final   A.calcoaceticus-baumannii NOT DETECTED NOT DETECTED Final   Bacteroides fragilis NOT DETECTED NOT DETECTED Final   Enterobacterales DETECTED (A) NOT DETECTED Final    Comment: Enterobacterales represent a large order of gram negative bacteria, not a single organism. CRITICAL RESULT CALLED TO, READ BACK BY AND VERIFIED WITH: ALEX CHAPPELL PHARMD 2229 11/25/20 HNM    Enterobacter cloacae complex NOT DETECTED NOT DETECTED Final   Escherichia coli DETECTED (A) NOT DETECTED Final    Comment: CRITICAL RESULT CALLED TO, READ BACK BY AND VERIFIED WITH: ALEX CHAPPELL PHARMD 2229 11/25/20 HNM    Klebsiella aerogenes NOT DETECTED NOT DETECTED Final   Klebsiella oxytoca NOT DETECTED NOT DETECTED Final   Klebsiella pneumoniae NOT DETECTED NOT DETECTED Final   Proteus species NOT DETECTED NOT DETECTED Final   Salmonella species NOT DETECTED NOT DETECTED Final   Serratia marcescens NOT DETECTED NOT DETECTED Final   Haemophilus influenzae NOT DETECTED NOT DETECTED Final   Neisseria meningitidis NOT DETECTED NOT DETECTED Final   Pseudomonas aeruginosa NOT DETECTED NOT DETECTED Final   Stenotrophomonas maltophilia NOT DETECTED NOT DETECTED Final   Candida albicans NOT DETECTED NOT DETECTED Final   Candida auris NOT DETECTED NOT DETECTED Final   Candida glabrata NOT DETECTED NOT DETECTED Final   Candida krusei NOT DETECTED NOT DETECTED Final   Candida parapsilosis NOT DETECTED NOT DETECTED Final    Candida tropicalis NOT DETECTED NOT DETECTED Final   Cryptococcus neoformans/gattii NOT DETECTED NOT DETECTED Final   CTX-M ESBL NOT DETECTED NOT DETECTED Final   Carbapenem resistance IMP NOT DETECTED NOT  DETECTED Final   Carbapenem resistance KPC NOT DETECTED NOT DETECTED Final   Carbapenem resistance NDM NOT DETECTED NOT DETECTED Final   Carbapenem resist OXA 48 LIKE NOT DETECTED NOT DETECTED Final   Carbapenem resistance VIM NOT DETECTED NOT DETECTED Final    Comment: Performed at [facility], [address], [place], [place] [address]  Urine Culture     Status: Abnormal   Collection Time: 11/25/20  3:09 AM   Specimen: Urine, Clean Catch  Result Value Ref Range Status   Specimen Description   Final    URINE, CLEAN CATCH Performed at [facility], [address] [place], [place] [address]    Special Requests   Final    NONE Performed at [facility], [address] [place], [place] [address]    Culture >=100,000 COLONIES/mL ESCHERICHIA COLI (A)  Final   Report Status 11/27/2020 FINAL  Final   Organism ID, Bacteria ESCHERICHIA COLI (A)  Final      Susceptibility   Escherichia coli - MIC*    AMPICILLIN <=2 SENSITIVE Sensitive     CEFAZOLIN <=4 SENSITIVE Sensitive     CEFEPIME <=0.12 SENSITIVE Sensitive     CEFTRIAXONE <=0.25 SENSITIVE Sensitive     CIPROFLOXACIN <=0.25 SENSITIVE Sensitive     GENTAMICIN <=1 SENSITIVE Sensitive     IMIPENEM <=0.25 SENSITIVE Sensitive     NITROFURANTOIN <=16 SENSITIVE Sensitive     TRIMETH/SULFA <=20 SENSITIVE Sensitive     AMPICILLIN/SULBACTAM <=2 SENSITIVE Sensitive     PIP/TAZO <=4 SENSITIVE Sensitive     * >=100,000 COLONIES/mL ESCHERICHIA COLI  Culture, blood (x 2)     Status: None (Preliminary result)   Collection Time: 11/25/20  8:17 PM   Specimen: BLOOD  Result Value Ref Range Status   Specimen Description BLOOD RIGHT ANTECUBITAL  Final   Special Requests   Final    BOTTLES DRAWN AEROBIC AND  ANAEROBIC Blood Culture adequate volume   Culture   Final    NO GROWTH 2 DAYS Performed at [facility], [address], [place], [place] [address]    Report Status PENDING  Incomplete  Culture, blood (x 2)     Status: None (Preliminary result)   Collection Time: 11/25/20  8:26 PM   Specimen: BLOOD  Result Value Ref Range Status   Specimen Description BLOOD BLOOD LEFT FOREARM  Final   Special Requests   Final    BOTTLES DRAWN AEROBIC AND ANAEROBIC Blood Culture adequate volume   Culture   Final    NO GROWTH 2 DAYS Performed at [facility], [address] [place], [place] [address]    Report Status PENDING  Incomplete  Urine Culture     Status: Abnormal (Preliminary result)   Collection Time: 11/25/20 11:26 PM   Specimen: PATH Other; Urine  Result Value Ref Range Status   Specimen Description   Final    PELVIS LEFT RENAL PELVIS CYSTOSCOPY Performed at [facility], [address], [place], [place] [address]    Special Requests   Final    NONE Performed at [facility], [place],  [address]    Culture 10,000 COLONIES/mL ESCHERICHIA COLI (A)  Final   Report Status PENDING  Incomplete   Radiology Studies: US RENAL  Result Date: 11/27/2020 CLINICAL DATA:  Status post left stent Hydronephrosis EXAM: RENAL / URINARY TRACT ULTRASOUND COMPLETE COMPARISON:  CT abdomen pelvis 11/25/2020 FINDINGS: Right Kidney: Renal measurements: 11.0 x 5.0  x 5.8 cm = volume: 168 mL. Echogenicity within normal limits. No mass or hydronephrosis visualized. Left Kidney: Renal measurements: 12.2 x 6.3 x 3.9 cm = volume: 156 mL. Echogenicity within normal limits. No mass visualized. Interval decrease of left hydronephrosis with mild dilatation still remaining. Left ureteral stent partially visualized. Bladder: Foley balloon noted within partially distended bladder. Other: None. IMPRESSION: Significant interval improvement of left hydronephrosis with  mild dilatation still remaining. Electronically Signed   By: Miachel Roux M.D.   On: 11/27/2020 12:22   DG OR UROLOGY CYSTO IMAGE ([place])  Result Date: 11/25/2020 There is no interpretation for this exam.  This order is for images obtained during a surgical procedure.  Please See "Surgeries" Tab for more information regarding the procedure.    Scheduled Meds:  atenolol  50 mg Oral QHS   Chlorhexidine Gluconate Cloth  6 each Topical Daily   cholecalciferol  1,000 Units Oral Daily   diltiazem  5 mg Intravenous Once   enoxaparin (LOVENOX) injection  30 mg Subcutaneous Q24H   fluticasone  1 spray Each Nare Daily   loratadine  10 mg Oral Daily   mometasone-formoterol  2 puff Inhalation BID   Oyster Shell Calcium/D  1 tablet Oral Q breakfast   pantoprazole (PROTONIX) IV  40 mg Intravenous Q24H   simvastatin  20 mg Oral Daily   vitamin B-12  100 mcg Oral Daily   Continuous Infusions:  sodium chloride Stopped (11/26/20 1008)   cefTRIAXone (ROCEPHIN)  IV 2 g (11/27/20 0555)     LOS: 2 days    Time spent: 25 mins    Ehsan Corvin, MD [facility]   If 7PM-7AM, please contact night-coverage

## 2020-11-28 ENCOUNTER — Other Ambulatory Visit: Payer: Self-pay | Admitting: Infectious Diseases

## 2020-11-28 DIAGNOSIS — N136 Pyonephrosis: Secondary | ICD-10-CM

## 2020-11-28 LAB — CBC
HCT: 34.8 % — ABNORMAL LOW (ref 36.0–46.0)
Hemoglobin: 11.5 g/dL — ABNORMAL LOW (ref 12.0–15.0)
MCH: 33.2 pg (ref 26.0–34.0)
MCHC: 33 g/dL (ref 30.0–36.0)
MCV: 100.6 fL — ABNORMAL HIGH (ref 80.0–100.0)
Platelets: 120 10*3/uL — ABNORMAL LOW (ref 150–400)
RBC: 3.46 MIL/uL — ABNORMAL LOW (ref 3.87–5.11)
RDW: 12.8 % (ref 11.5–15.5)
WBC: 12 10*3/uL — ABNORMAL HIGH (ref 4.0–10.5)
nRBC: 0 % (ref 0.0–0.2)

## 2020-11-28 LAB — BASIC METABOLIC PANEL
Anion gap: 8 (ref 5–15)
BUN: 22 mg/dL (ref 8–23)
CO2: 26 mmol/L (ref 22–32)
Calcium: 8.4 mg/dL — ABNORMAL LOW (ref 8.9–10.3)
Chloride: 105 mmol/L (ref 98–111)
Creatinine, Ser: 0.76 mg/dL (ref 0.44–1.00)
GFR, Estimated: >60 mL/min (ref >60)
Glucose, Bld: 103 mg/dL — ABNORMAL HIGH (ref 70–99)
Potassium: 3.5 mmol/L (ref 3.5–5.1)
Sodium: 139 mmol/L (ref 135–145)

## 2020-11-28 LAB — MAGNESIUM: Magnesium: 1.8 mg/dL (ref 1.7–2.4)

## 2020-11-28 LAB — CULTURE, BLOOD (ROUTINE X 2)

## 2020-11-28 LAB — URINE CULTURE: Culture: 10000 — AB

## 2020-11-28 LAB — PHOSPHORUS: Phosphorus: 2.2 mg/dL — ABNORMAL LOW (ref 2.5–4.6)

## 2020-11-28 MED ORDER — SULFAMETHOXAZOLE-TRIMETHOPRIM 800-160 MG PO TABS: 1.0000 | ORAL_TABLET | Freq: Two times a day (BID) | ORAL | 0 refills | Status: DC

## 2020-11-28 NOTE — Discharge Instructions (Signed)
Advised to follow-up with primary care physician in 1 week. Advised to follow-up with infectious disease doctor as scheduled. Advised to follow-up with urology as scheduled.  Appointment has been made.   Advised to take Bactrim twice a day for 10 days to complete 14-day treatment for pyelonephritis.

## 2020-11-28 NOTE — Care Management Important Message (Signed)
Important Message  Patient Details  Name: Heather [name] MRN: [id number] Date of Birth: 1935/12/01   Medicare Important Message Given:  Yes     Dannette Barbara 11/28/2020, 1:25 PM

## 2020-11-28 NOTE — Plan of Care (Signed)
?  Problem: Clinical Measurements: ?Goal: Ability to maintain clinical measurements within normal limits will improve ?Outcome: Progressing ?  ?Problem: Clinical Measurements: ?Goal: Will remain free from infection ?Outcome: Progressing ?  ?Problem: Clinical Measurements: ?Goal: Respiratory complications will improve ?Outcome: Progressing ?  ?

## 2020-11-28 NOTE — Discharge Summary (Signed)
Physician Discharge Summary  Heather [name] [facility]:[id number] DOB: 03-07-1935 DOA: 11/25/2020  PCP: Rusty Aus, MD  Admit date: 11/25/2020  Discharge date: 11/28/2020  Admitted From: Home.  Disposition:  Home.  Recommendations for Outpatient Follow-up:  Follow up with PCP in 1-2 weeks. Please obtain BMP/CBC in one week. Advised to follow-up with infectious disease as scheduled. Advised to follow-up with Urology as scheduled.  Appointment has been made.   Advised to take Bactrim twice a day for 10 days to complete 14-day treatment for pyelonephritis.  Home Health: None Equipment/Devices: None  Discharge Condition: Stable CODE STATUS: Full code Diet recommendation: Heart Healthy  Brief Summary /Hospital Course: This 85 years old female with PMH significant for hypertension, GERD, paroxysmal atrial tachycardia, history of brain aneurysm presented to the ED for the evaluation of abdominal pain which started about 2 weeks ago and got worse in last 2 days.  Patient also reported increased urinary frequency associated with urgency.  CT abdomen pelvis showed moderate left hydronephrosis with transition at the left ureteropelvic junction. Patient started on antibiotics(ceftriaxone). Patient was admitted for acute pyelonephritis.  Urology was consulted. Patient became septic overnight with hypotension,  elevated lactate and worsening kidney functions.  Patient underwent emergent left ureteral stent placement.  Postprocedure hypotension has resolved. lactate has normalized.  Blood and urine cultures growing E. Coli, pan sensitive. Foley catheter discontinued next day.  Renal ultrasound showed resolution of hydronephrosis.Patient feels better Foley catheter was removed. she has successfully voided.  Urologist recommended outpatient follow-up. Infectious disease recommended to discharge patient on Bactrim twice a day for 10 days for total 14 days of treatment.  Patient is being discharged.   She was  managed for below problems.   Discharge Diagnoses:  Principal Problem:   Acute pyelonephritis Active Problems:   GERD (gastroesophageal reflux disease)   Benign essential hypertension   Panlobular emphysema (HCC)   Hypoxia   Acute lower UTI   Hydronephrosis of left kidney  Severe sepsis secondary to acute pyelonephritis: Patient was admitted for acute pyelonephritis and started on IV ceftriaxone. Overnight patient became septic with hypotension, elevated lactic acid and worsening serum creatinine.   Urology was consulted.  Patient underwent cystoscopy with left ureteral stent placement. Now Blood pressure has improved.  lactic acid normalized. Continue IV hydration.  Continue IV ceftriaxone.   Blood cultures no growth so far, urine culture grew E. coli pansensitive. She will require 14 days of culture appropriate antibiotics. Foley catheter discontinued.  Sepsis physiology is improving. Infectious disease consulted , recommended Bactrim at discharge for total of 14 days treatment   Left-sided hydronephrosis: > Resolved. Urology is following.  Patient is now status post stent placement. Repeat ultrasound shows improvement in hydronephrosis.   Dehydration: > Resolved. It could be due to GI loss from nausea and vomiting. Serum creatinine has improved.  Continue IV hydration.   Essential hypertension: Blood pressure is stable.  Continue atenolol.   Hyperlipidemia: Continue Lipitor  Discharge Instructions  Discharge Instructions     Call MD for:  difficulty breathing, headache or visual disturbances   Complete by: As directed    Call MD for:  severe uncontrolled pain   Complete by: As directed    Call MD for:  temperature >100.4   Complete by: As directed    Diet - low sodium heart healthy   Complete by: As directed    Diet - low sodium heart healthy   Complete by: As directed    Diet Carb Modified  Complete by: As directed    Discharge instructions   Complete by:  As directed    Advised to follow-up with primary care physician in 1 week. Advised to follow-up with infectious disease doctor as scheduled. Advised to follow-up with urology as scheduled.  Appointment has been made.   Advised to take Bactrim twice a day for 10 days to complete 14-day treatment for pyelonephritis.   Increase activity slowly   Complete by: As directed    Increase activity slowly   Complete by: As directed       Allergies as of 11/28/2020       Reactions   Ace Inhibitors Swelling   Contrast Media [iodinated Diagnostic Agents]    Cortisone    Patient had facial swelling and itching from cortisone injection IM        Medication List     STOP taking these medications    ALPRAZolam 0.25 MG tablet Commonly known as: XANAX   amoxicillin 500 MG capsule Commonly known as: AMOXIL   Co Q 10 100 MG Caps   donepezil 5 MG tablet Commonly known as: ARICEPT   oxyCODONE-acetaminophen 5-325 MG tablet Commonly known as: PERCOCET/ROXICET       TAKE these medications    amLODipine 5 MG tablet Commonly known as: NORVASC Take 5 mg by mouth at bedtime.   atenolol 50 MG tablet Commonly known as: TENORMIN Take 50 mg by mouth at bedtime.   calcium-vitamin D 500-200 MG-UNIT tablet Commonly known as: OSCAL WITH D Take 1 tablet by mouth.   cetirizine 10 MG tablet Commonly known as: ZYRTEC Take 10 mg by mouth daily.   Cholecalciferol 25 MCG (1000 UT) tablet Take 1,000 Units by mouth daily.   fluticasone 50 MCG/ACT nasal spray Commonly known as: FLONASE Place 1 spray into the nose daily as needed for allergies.   Fluticasone-Salmeterol 100-50 MCG/DOSE Aepb Commonly known as: ADVAIR Inhale into the lungs.   hydrochlorothiazide 25 MG tablet Commonly known as: HYDRODIURIL Take 25 mg by mouth daily as needed.   simvastatin 20 MG tablet Commonly known as: ZOCOR Take 20 mg by mouth daily.   sulfamethoxazole-trimethoprim 800-160 MG tablet Commonly known as:  BACTRIM DS Take 1 tablet by mouth 2 (two) times daily.   vitamin B-12 100 MCG tablet Commonly known as: CYANOCOBALAMIN Take 100 mcg by mouth daily.        Follow-up Information     Rusty Aus, MD Follow up in 1 week(s).   Specialty: Internal Medicine Contact information: [address] [place] [address] [phone]         Tsosie Billing, MD Follow up in 1 week(s).   Specialty: Infectious Diseases Contact information: [place] [address] [phone]         Abbie Sons, MD Follow up in 1 week(s).   Specialty: Urology Contact information: [place] [address] [phone]                Allergies  Allergen Reactions   Ace Inhibitors Swelling   Contrast Media [Iodinated Diagnostic Agents]    Cortisone     Patient had facial swelling and itching from cortisone injection IM    Consultations:  Urology Infectious disease.  Procedures/Studies: CT Abdomen Pelvis Wo Contrast  Result Date: 11/25/2020 CLINICAL DATA:  Abdominal pain.  Concern for acute diverticulitis. EXAM: CT ABDOMEN AND PELVIS WITHOUT CONTRAST TECHNIQUE: Multidetector CT imaging of the abdomen  and pelvis was performed following the standard protocol without IV contrast. COMPARISON:  CT abdomen pelvis dated 08/23/2013 FINDINGS: Evaluation of this exam is limited in the absence of intravenous contrast. Lower chest: Bibasilar linear atelectasis/scarring. There is a focus of calcification at the left lung base associated with linear scarring. No intra-abdominal free air or free fluid. Hepatobiliary: Small hepatic hypodense lesions are not characterized on this CT. No intrahepatic biliary ductal dilatation. The gallbladder is unremarkable. Pancreas: Unremarkable. No pancreatic ductal dilatation or surrounding inflammatory changes. Spleen: Normal in size without focal abnormality.  Adrenals/Urinary Tract: The adrenal glands are unremarkable. There is moderate left hydronephrosis. No stone identified. A transition is noted at the left ureteropelvic junction. Underlying stricture or urothelial lesion is not excluded. Urology consult is advised. There is left perinephric stranding. Correlation with urinalysis recommended to exclude superimposed UTI. There is no hydronephrosis or nephrolithiasis on the right. The right ureter and urinary bladder appear unremarkable. Stomach/Bowel: There is sigmoid diverticulosis without active inflammatory changes. There is no bowel obstruction or active inflammation. There is a small hiatal hernia. The appendix is normal. Vascular/Lymphatic: Advanced aortoiliac atherosclerotic disease. There is a 2.1 cm infrarenal aortic ectasia. The IVC is unremarkable. No portal venous gas. There is no adenopathy. Reproductive: The uterus is anteverted and grossly unremarkable. No adnexal masses. Other: None Musculoskeletal: Degenerative changes of the spine. No acute osseous pathology. IMPRESSION: 1. Moderate left hydronephrosis with transition at the left ureteropelvic junction. No stone identified. Underlying stricture or urothelial lesion is not excluded. Urology consult is advised. 2. Sigmoid diverticulosis. No bowel obstruction. Normal appendix. 3. Aortic Atherosclerosis (ICD10-I70.0). Electronically Signed   By: Anner Crete M.D.   On: 11/25/2020 01:18   DG Chest 2 View  Result Date: 11/24/2020 CLINICAL DATA:  Hypoxia. EXAM: CHEST - 2 VIEW COMPARISON:  Chest radiograph dated 05/30/2017. FINDINGS: Postsurgical changes of right upper lobe. No consolidative changes there is no pleural effusion pneumothorax. The cardiac silhouette is within limits. Atherosclerotic calcification of the aorta. No acute osseous pathology. IMPRESSION: No acute cardiopulmonary process. Electronically Signed   By: Anner Crete M.D.   On: 11/24/2020 23:33   US RENAL  Result Date:  11/27/2020 CLINICAL DATA:  Status post left stent Hydronephrosis EXAM: RENAL / URINARY TRACT ULTRASOUND COMPLETE COMPARISON:  CT abdomen pelvis 11/25/2020 FINDINGS: Right Kidney: Renal measurements: 11.0 x 5.0 x 5.8 cm = volume: 168 mL. Echogenicity within normal limits. No mass or hydronephrosis visualized. Left Kidney: Renal measurements: 12.2 x 6.3 x 3.9 cm = volume: 156 mL. Echogenicity within normal limits. No mass visualized. Interval decrease of left hydronephrosis with mild dilatation still remaining. Left ureteral stent partially visualized. Bladder: Foley balloon noted within partially distended bladder. Other: None. IMPRESSION: Significant interval improvement of left hydronephrosis with mild dilatation still remaining. Electronically Signed   By: Miachel Roux M.D.   On: 11/27/2020 12:22   DG Chest Port 1 View  Result Date: 11/25/2020 CLINICAL DATA:  85 year old female with shortness of breath and abdominal pain. EXAM: PORTABLE CHEST - 1 VIEW COMPARISON:  11/24/2020, 09/11/2020 FINDINGS: The mediastinal contours are within normal limits. No cardiomegaly. Atherosclerotic calcification of the aortic arch. Postsurgical changes after right upper left lower wedge resections without complicating features. No focal consolidation, pleural effusion, or pneumothorax. No acute osseous abnormality. IMPRESSION: 1. No acute cardiopulmonary process. 2. Postsurgical changes after right upper left lower wedge resections without complicating features. 3.  Aortic Atherosclerosis (ICD10-I70.0). Electronically Signed   By: Ruthann Cancer M.D.   On: 11/25/2020  10:35   DG OR UROLOGY CYSTO IMAGE ([facility] ONLY)  Result Date: 11/25/2020 There is no interpretation for this exam.  This order is for images obtained during a surgical procedure.  Please See "Surgeries" Tab for more information regarding the procedure.    S/p ureteral stent.   Subjective: Patient was seen and examined at bedside.  Overnight events noted.   Patient reports feeling much improved.  She has ambulated.  Patient is being discharged home  Discharge Exam: Vitals:   11/28/20 0259 11/28/20 0750  BP: 137/75 (!) 162/80  Pulse: 65 70  Resp: 18 17  Temp: 98.2 F (36.8 C) 98.3 F (36.8 C)  SpO2: 90% 91%   Vitals:   11/27/20 1939 11/27/20 2328 11/28/20 0259 11/28/20 0750  BP: 127/72 131/67 137/75 (!) 162/80  Pulse: 73 71 65 70  Resp: 18 18 18 17   Temp: 98.7 F (37.1 C) 97.7 F (36.5 C) 98.2 F (36.8 C) 98.3 F (36.8 C)  TempSrc:      SpO2: 98% 90% 90% 91%  Weight:      Height:        General: Pt is alert, awake, not in acute distress Cardiovascular: RRR, S1/S2 +, no rubs, no gallops Respiratory: CTA bilaterally, no wheezing, no rhonchi Abdominal: Soft, NT, ND, bowel sounds + Extremities: no edema, no cyanosis    The results of significant diagnostics from this hospitalization (including imaging, microbiology, ancillary and laboratory) are listed below for reference.     Microbiology: Recent Results (from the past 240 hour(s))  Resp Panel by RT-PCR (Flu A&B, Covid) Nasopharyngeal Swab     Status: None   Collection Time: 11/25/20 12:19 AM   Specimen: Nasopharyngeal Swab; Nasopharyngeal(NP) swabs in vial transport medium  Result Value Ref Range Status   SARS Coronavirus 2 by RT PCR NEGATIVE NEGATIVE Final    Comment: (NOTE) SARS-CoV-2 target nucleic acids are NOT DETECTED.  The SARS-CoV-2 RNA is generally detectable in upper respiratory specimens during the acute phase of infection. The lowest concentration of SARS-CoV-2 viral copies this assay can detect is 138 copies/mL. A negative result does not preclude SARS-Cov-2 infection and should not be used as the sole basis for treatment or other patient management decisions. A negative result may occur with  improper specimen collection/handling, submission of specimen other than nasopharyngeal swab, presence of viral mutation(s) within the areas targeted by this  assay, and inadequate number of viral copies(<138 copies/mL). A negative result must be combined with clinical observations, patient history, and epidemiological information. The expected result is Negative.  Fact Sheet for Patients:  EntrepreneurPulse.com.au  Fact Sheet for Healthcare Providers:  IncredibleEmployment.be  This test is no t yet approved or cleared by the [place] FDA and  has been authorized for detection and/or diagnosis of SARS-CoV-2 by FDA under an Emergency Use Authorization (EUA). This EUA will remain  in effect (meaning this test can be used) for the duration of the COVID-19 declaration under Section 564(b)(1) of the Act, 21 U.S.C.section 360bbb-3(b)(1), unless the authorization is terminated  or revoked sooner.       Influenza A by PCR NEGATIVE NEGATIVE Final   Influenza B by PCR NEGATIVE NEGATIVE Final    Comment: (NOTE) The Xpert Xpress SARS-CoV-2/FLU/RSV plus assay is intended as an aid in the diagnosis of influenza from Nasopharyngeal swab specimens and should not be used as a sole basis for treatment. Nasal washings and aspirates are unacceptable for Xpert Xpress SARS-CoV-2/FLU/RSV testing.  Fact Sheet for Patients: EntrepreneurPulse.com.au  Fact Sheet for Healthcare Providers: IncredibleEmployment.be  This test is not yet approved or cleared by the [place] FDA and has been authorized for detection and/or diagnosis of SARS-CoV-2 by FDA under an Emergency Use Authorization (EUA). This EUA will remain in effect (meaning this test can be used) for the duration of the COVID-19 declaration under Section 564(b)(1) of the Act, 21 U.S.C. section 360bbb-3(b)(1), unless the authorization is terminated or revoked.  Performed at [facility], [address] [place], [place] [address]   Culture, blood (routine x 2)     Status: Abnormal   Collection Time: 11/25/20   3:00 AM   Specimen: BLOOD  Result Value Ref Range Status   Specimen Description   Final    BLOOD RIGHT ASSIST CONTROL Performed at [facility], [address], [place], [place] [address]    Special Requests   Final    BOTTLES DRAWN AEROBIC AND ANAEROBIC Blood Culture results may not be optimal due to an excessive volume of blood received in culture bottles Performed at [facility], [address], [place], [place] [address]    Culture  Setup Time   Final    GRAM NEGATIVE RODS AEROBIC BOTTLE ONLY Organism ID to follow CRITICAL RESULT CALLED TO, READ BACK BY AND VERIFIED WITH: ALEX CHAPPELL PHARMD 2229 11/25/20 HNM    Culture ESCHERICHIA COLI (A)  Final   Report Status 11/28/2020 FINAL  Final   Organism ID, Bacteria ESCHERICHIA COLI  Final      Susceptibility   Escherichia coli - MIC*    AMPICILLIN <=2 SENSITIVE Sensitive     CEFAZOLIN <=4 SENSITIVE Sensitive     CEFEPIME <=0.12 SENSITIVE Sensitive     CEFTAZIDIME <=1 SENSITIVE Sensitive     CEFTRIAXONE <=0.25 SENSITIVE Sensitive     CIPROFLOXACIN <=0.25 SENSITIVE Sensitive     GENTAMICIN <=1 SENSITIVE Sensitive     IMIPENEM <=0.25 SENSITIVE Sensitive     TRIMETH/SULFA <=20 SENSITIVE Sensitive     AMPICILLIN/SULBACTAM <=2 SENSITIVE Sensitive     PIP/TAZO <=4 SENSITIVE Sensitive     * ESCHERICHIA COLI  Culture, blood (routine x 2)     Status: None (Preliminary result)   Collection Time: 11/25/20  3:00 AM   Specimen: BLOOD  Result Value Ref Range Status   Specimen Description BLOOD RIGHT WRIST  Final   Special Requests   Final    BOTTLES DRAWN AEROBIC AND ANAEROBIC Blood Culture results may not be optimal due to an excessive volume of blood received in culture bottles   Culture   Final    NO GROWTH 3 DAYS Performed at [facility], [address] [place], [place] [address]    Report Status PENDING  Incomplete  Blood Culture ID Panel (Reflexed)     Status: Abnormal   Collection Time: 11/25/20   3:00 AM  Result Value Ref Range Status   Enterococcus faecalis NOT DETECTED NOT DETECTED Final   Enterococcus Faecium NOT DETECTED NOT DETECTED Final   Listeria monocytogenes NOT DETECTED NOT DETECTED Final   Staphylococcus species NOT DETECTED NOT DETECTED Final   Staphylococcus aureus (BCID) NOT DETECTED NOT DETECTED Final   Staphylococcus epidermidis NOT DETECTED NOT DETECTED Final   Staphylococcus lugdunensis NOT DETECTED NOT DETECTED Final   Streptococcus species NOT DETECTED NOT DETECTED Final   Streptococcus agalactiae NOT DETECTED NOT DETECTED Final   Streptococcus pneumoniae NOT DETECTED NOT DETECTED Final   Streptococcus pyogenes NOT DETECTED NOT DETECTED Final   A.calcoaceticus-baumannii NOT  DETECTED NOT DETECTED Final   Bacteroides fragilis NOT DETECTED NOT DETECTED Final   Enterobacterales DETECTED (A) NOT DETECTED Final    Comment: Enterobacterales represent a large order of gram negative bacteria, not a single organism. CRITICAL RESULT CALLED TO, READ BACK BY AND VERIFIED WITH: ALEX CHAPPELL PHARMD 2229 11/25/20 HNM    Enterobacter cloacae complex NOT DETECTED NOT DETECTED Final   Escherichia coli DETECTED (A) NOT DETECTED Final    Comment: CRITICAL RESULT CALLED TO, READ BACK BY AND VERIFIED WITH: ALEX CHAPPELL PHARMD 2229 11/25/20 HNM    Klebsiella aerogenes NOT DETECTED NOT DETECTED Final   Klebsiella oxytoca NOT DETECTED NOT DETECTED Final   Klebsiella pneumoniae NOT DETECTED NOT DETECTED Final   Proteus species NOT DETECTED NOT DETECTED Final   Salmonella species NOT DETECTED NOT DETECTED Final   Serratia marcescens NOT DETECTED NOT DETECTED Final   Haemophilus influenzae NOT DETECTED NOT DETECTED Final   Neisseria meningitidis NOT DETECTED NOT DETECTED Final   Pseudomonas aeruginosa NOT DETECTED NOT DETECTED Final   Stenotrophomonas maltophilia NOT DETECTED NOT DETECTED Final   Candida albicans NOT DETECTED NOT DETECTED Final   Candida auris NOT DETECTED NOT  DETECTED Final   Candida glabrata NOT DETECTED NOT DETECTED Final   Candida krusei NOT DETECTED NOT DETECTED Final   Candida parapsilosis NOT DETECTED NOT DETECTED Final   Candida tropicalis NOT DETECTED NOT DETECTED Final   Cryptococcus neoformans/gattii NOT DETECTED NOT DETECTED Final   CTX-M ESBL NOT DETECTED NOT DETECTED Final   Carbapenem resistance IMP NOT DETECTED NOT DETECTED Final   Carbapenem resistance KPC NOT DETECTED NOT DETECTED Final   Carbapenem resistance NDM NOT DETECTED NOT DETECTED Final   Carbapenem resist OXA 48 LIKE NOT DETECTED NOT DETECTED Final   Carbapenem resistance VIM NOT DETECTED NOT DETECTED Final    Comment: Performed at [facility], [address], [place], [place] [address]  Urine Culture     Status: Abnormal   Collection Time: 11/25/20  3:09 AM   Specimen: Urine, Clean Catch  Result Value Ref Range Status   Specimen Description   Final    URINE, CLEAN CATCH Performed at [facility], [address] [place], [place] [address]    Special Requests   Final    NONE Performed at [facility], [address] [place], [place] [address]    Culture >=100,000 COLONIES/mL ESCHERICHIA COLI (A)  Final   Report Status 11/27/2020 FINAL  Final   Organism ID, Bacteria ESCHERICHIA COLI (A)  Final      Susceptibility   Escherichia coli - MIC*    AMPICILLIN <=2 SENSITIVE Sensitive     CEFAZOLIN <=4 SENSITIVE Sensitive     CEFEPIME <=0.12 SENSITIVE Sensitive     CEFTRIAXONE <=0.25 SENSITIVE Sensitive     CIPROFLOXACIN <=0.25 SENSITIVE Sensitive     GENTAMICIN <=1 SENSITIVE Sensitive     IMIPENEM <=0.25 SENSITIVE Sensitive     NITROFURANTOIN <=16 SENSITIVE Sensitive     TRIMETH/SULFA <=20 SENSITIVE Sensitive     AMPICILLIN/SULBACTAM <=2 SENSITIVE Sensitive     PIP/TAZO <=4 SENSITIVE Sensitive     * >=100,000 COLONIES/mL ESCHERICHIA COLI  Culture, blood (x 2)     Status: None (Preliminary result)   Collection Time: 11/25/20  8:17  PM   Specimen: BLOOD  Result Value Ref Range Status   Specimen Description BLOOD RIGHT ANTECUBITAL  Final   Special Requests   Final    BOTTLES DRAWN AEROBIC AND ANAEROBIC Blood Culture adequate  volume   Culture   Final    NO GROWTH 3 DAYS Performed at [facility], [address] [place], [place] [address]    Report Status PENDING  Incomplete  Culture, blood (x 2)     Status: None (Preliminary result)   Collection Time: 11/25/20  8:26 PM   Specimen: BLOOD  Result Value Ref Range Status   Specimen Description BLOOD BLOOD LEFT FOREARM  Final   Special Requests   Final    BOTTLES DRAWN AEROBIC AND ANAEROBIC Blood Culture adequate volume   Culture   Final    NO GROWTH 3 DAYS Performed at [facility], [address], [place], [place] [address]    Report Status PENDING  Incomplete  Urine Culture     Status: Abnormal   Collection Time: 11/25/20 11:26 PM   Specimen: PATH Other; Urine  Result Value Ref Range Status   Specimen Description   Final    PELVIS LEFT RENAL PELVIS CYSTOSCOPY Performed at [facility], [place] [address], [place], [place] [address]    Special Requests   Final    NONE Performed at [facility], [place], [place] [address]    Culture 10,000 COLONIES/mL ESCHERICHIA COLI (A)  Final   Report Status 11/28/2020 FINAL  Final   Organism ID, Bacteria ESCHERICHIA COLI (A)  Final      Susceptibility   Escherichia coli - MIC*    AMPICILLIN <=2 SENSITIVE Sensitive     CEFAZOLIN <=4 SENSITIVE Sensitive     CEFEPIME <=0.12 SENSITIVE Sensitive     CEFTAZIDIME <=1 SENSITIVE Sensitive     CEFTRIAXONE <=0.25 SENSITIVE Sensitive     CIPROFLOXACIN <=0.25 SENSITIVE Sensitive     GENTAMICIN <=1 SENSITIVE Sensitive     IMIPENEM <=0.25 SENSITIVE Sensitive     TRIMETH/SULFA <=20 SENSITIVE Sensitive     AMPICILLIN/SULBACTAM <=2 SENSITIVE Sensitive     PIP/TAZO <=4 SENSITIVE Sensitive     * 10,000 COLONIES/mL ESCHERICHIA COLI      Labs: BNP (last 3 results) No results for input(s): BNP in the last 8760 hours. Basic Metabolic Panel: Recent Labs  Lab 11/24/20 2320 11/25/20 2016 11/26/20 0151 11/27/20 0541 11/28/20 0520  NA 139 137 136 138 139  K 3.8 3.2* 4.3 3.6 3.5  CL 104 104 107 105 105  CO2 23 24 22 24 26   GLUCOSE 138* 110* 104* 74 103*  BUN 23 28* 35* 25* 22  CREATININE 1.20* 2.22* 1.95* 1.04* 0.76  CALCIUM 9.3 7.9* 7.8* 8.4* 8.4*  MG  --   --   --  1.9 1.8  PHOS  --   --   --  2.8 2.2*   Liver Function Tests: Recent Labs  Lab 11/24/20 2320 11/25/20 2016  AST 19 28  ALT 14 13  ALKPHOS 44 44  BILITOT 0.7 0.8  PROT 6.5 5.1*  ALBUMIN 3.8 2.9*   Recent Labs  Lab 11/24/20 2320  LIPASE 35   No results for input(s): AMMONIA in the last 168 hours. CBC: Recent Labs  Lab 11/24/20 2320 11/25/20 2016 11/26/20 0151 11/27/20 0541 11/28/20 0520  WBC 13.1* 11.3* 15.8* 14.4* 12.0*  NEUTROABS  --  9.8*  --   --   --   HGB 14.4 11.6* 11.7* 12.2 11.5*  HCT 42.1 35.1* 34.1* 37.1 34.8*  MCV 101.0* 102.6* 103.3* 101.6* 100.6*  PLT 154 110* 97* 110* 120*   Cardiac Enzymes: No results for input(s): CKTOTAL, CKMB, CKMBINDEX, TROPONINI  in the last 168 hours. BNP: Invalid input(s): POCBNP CBG: No results for input(s): GLUCAP in the last 168 hours. D-Dimer No results for input(s): DDIMER in the last 72 hours. Hgb A1c No results for input(s): HGBA1C in the last 72 hours. Lipid Profile No results for input(s): CHOL, HDL, LDLCALC, TRIG, CHOLHDL, LDLDIRECT in the last 72 hours. Thyroid function studies No results for input(s): TSH, T4TOTAL, T3FREE, THYROIDAB in the last 72 hours.  Invalid input(s): FREET3 Anemia work up No results for input(s): VITAMINB12, FOLATE, FERRITIN, TIBC, IRON, RETICCTPCT in the last 72 hours. Urinalysis    Component Value Date/Time   COLORURINE YELLOW (A) 11/25/2020 0309   APPEARANCEUR CLOUDY (A) 11/25/2020 0309   LABSPEC 1.020 11/25/2020 0309   PHURINE 5.0  11/25/2020 0309   GLUCOSEU NEGATIVE 11/25/2020 0309   HGBUR LARGE (A) 11/25/2020 0309   BILIRUBINUR NEGATIVE 11/25/2020 0309   KETONESUR 20 (A) 11/25/2020 0309   PROTEINUR 100 (A) 11/25/2020 0309   NITRITE POSITIVE (A) 11/25/2020 0309   LEUKOCYTESUR LARGE (A) 11/25/2020 0309   Sepsis Labs Invalid input(s): PROCALCITONIN,  WBC,  LACTICIDVEN Microbiology Recent Results (from the past 240 hour(s))  Resp Panel by RT-PCR (Flu A&B, Covid) Nasopharyngeal Swab     Status: None   Collection Time: 11/25/20 12:19 AM   Specimen: Nasopharyngeal Swab; Nasopharyngeal(NP) swabs in vial transport medium  Result Value Ref Range Status   SARS Coronavirus 2 by RT PCR NEGATIVE NEGATIVE Final    Comment: (NOTE) SARS-CoV-2 target nucleic acids are NOT DETECTED.  The SARS-CoV-2 RNA is generally detectable in upper respiratory specimens during the acute phase of infection. The lowest concentration of SARS-CoV-2 viral copies this assay can detect is 138 copies/mL. A negative result does not preclude SARS-Cov-2 infection and should not be used as the sole basis for treatment or other patient management decisions. A negative result may occur with  improper specimen collection/handling, submission of specimen other than nasopharyngeal swab, presence of viral mutation(s) within the areas targeted by this assay, and inadequate number of viral copies(<138 copies/mL). A negative result must be combined with clinical observations, patient history, and epidemiological information. The expected result is Negative.  Fact Sheet for Patients:  EntrepreneurPulse.com.au  Fact Sheet for Healthcare Providers:  IncredibleEmployment.be  This test is no t yet approved or cleared by the [place] FDA and  has been authorized for detection and/or diagnosis of SARS-CoV-2 by FDA under an Emergency Use Authorization (EUA). This EUA will remain  in effect (meaning this test can be used)  for the duration of the COVID-19 declaration under Section 564(b)(1) of the Act, 21 U.S.C.section 360bbb-3(b)(1), unless the authorization is terminated  or revoked sooner.       Influenza A by PCR NEGATIVE NEGATIVE Final   Influenza B by PCR NEGATIVE NEGATIVE Final    Comment: (NOTE) The Xpert Xpress SARS-CoV-2/FLU/RSV plus assay is intended as an aid in the diagnosis of influenza from Nasopharyngeal swab specimens and should not be used as a sole basis for treatment. Nasal washings and aspirates are unacceptable for Xpert Xpress SARS-CoV-2/FLU/RSV testing.  Fact Sheet for Patients: EntrepreneurPulse.com.au  Fact Sheet for Healthcare Providers: IncredibleEmployment.be  This test is not yet approved or cleared by the [place] FDA and has been authorized for detection and/or diagnosis of SARS-CoV-2 by FDA under an Emergency Use Authorization (EUA). This EUA will remain in effect (meaning this test can be used) for the duration of the COVID-19 declaration under Section 564(b)(1) of the Act, 21 U.S.C. section  360bbb-3(b)(1), unless the authorization is terminated or revoked.  Performed at [facility], [address] [place], [place] [address]   Culture, blood (routine x 2)     Status: Abnormal   Collection Time: 11/25/20  3:00 AM   Specimen: BLOOD  Result Value Ref Range Status   Specimen Description   Final    BLOOD RIGHT ASSIST CONTROL Performed at [facility], [address] [place], [place] [address]    Special Requests   Final    BOTTLES DRAWN AEROBIC AND ANAEROBIC Blood Culture results may not be optimal due to an excessive volume of blood received in culture bottles Performed at [facility], [address] [place], [place] [address]    Culture  Setup Time   Final    GRAM NEGATIVE RODS AEROBIC BOTTLE ONLY Organism ID to follow CRITICAL RESULT CALLED TO, READ BACK BY AND VERIFIED WITH: ALEX  CHAPPELL PHARMD 2229 11/25/20 HNM    Culture ESCHERICHIA COLI (A)  Final   Report Status 11/28/2020 FINAL  Final   Organism ID, Bacteria ESCHERICHIA COLI  Final      Susceptibility   Escherichia coli - MIC*    AMPICILLIN <=2 SENSITIVE Sensitive     CEFAZOLIN <=4 SENSITIVE Sensitive     CEFEPIME <=0.12 SENSITIVE Sensitive     CEFTAZIDIME <=1 SENSITIVE Sensitive     CEFTRIAXONE <=0.25 SENSITIVE Sensitive     CIPROFLOXACIN <=0.25 SENSITIVE Sensitive     GENTAMICIN <=1 SENSITIVE Sensitive     IMIPENEM <=0.25 SENSITIVE Sensitive     TRIMETH/SULFA <=20 SENSITIVE Sensitive     AMPICILLIN/SULBACTAM <=2 SENSITIVE Sensitive     PIP/TAZO <=4 SENSITIVE Sensitive     * ESCHERICHIA COLI  Culture, blood (routine x 2)     Status: None (Preliminary result)   Collection Time: 11/25/20  3:00 AM   Specimen: BLOOD  Result Value Ref Range Status   Specimen Description BLOOD RIGHT WRIST  Final   Special Requests   Final    BOTTLES DRAWN AEROBIC AND ANAEROBIC Blood Culture results may not be optimal due to an excessive volume of blood received in culture bottles   Culture   Final    NO GROWTH 3 DAYS Performed at [facility], [address] [place], [place] [address]    Report Status PENDING  Incomplete  Blood Culture ID Panel (Reflexed)     Status: Abnormal   Collection Time: 11/25/20  3:00 AM  Result Value Ref Range Status   Enterococcus faecalis NOT DETECTED NOT DETECTED Final   Enterococcus Faecium NOT DETECTED NOT DETECTED Final   Listeria monocytogenes NOT DETECTED NOT DETECTED Final   Staphylococcus species NOT DETECTED NOT DETECTED Final   Staphylococcus aureus (BCID) NOT DETECTED NOT DETECTED Final   Staphylococcus epidermidis NOT DETECTED NOT DETECTED Final   Staphylococcus lugdunensis NOT DETECTED NOT DETECTED Final   Streptococcus species NOT DETECTED NOT DETECTED Final   Streptococcus agalactiae NOT DETECTED NOT DETECTED Final   Streptococcus pneumoniae NOT DETECTED NOT  DETECTED Final   Streptococcus pyogenes NOT DETECTED NOT DETECTED Final   A.calcoaceticus-baumannii NOT DETECTED NOT DETECTED Final   Bacteroides fragilis NOT DETECTED NOT DETECTED Final   Enterobacterales DETECTED (A) NOT DETECTED Final    Comment: Enterobacterales represent a large order of gram negative bacteria, not a single organism. CRITICAL RESULT CALLED TO, READ BACK BY AND VERIFIED WITH: ALEX CHAPPELL PHARMD 2229 11/25/20 HNM    Enterobacter cloacae complex NOT DETECTED NOT DETECTED Final  Escherichia coli DETECTED (A) NOT DETECTED Final    Comment: CRITICAL RESULT CALLED TO, READ BACK BY AND VERIFIED WITH: ALEX CHAPPELL PHARMD 2229 11/25/20 HNM    Klebsiella aerogenes NOT DETECTED NOT DETECTED Final   Klebsiella oxytoca NOT DETECTED NOT DETECTED Final   Klebsiella pneumoniae NOT DETECTED NOT DETECTED Final   Proteus species NOT DETECTED NOT DETECTED Final   Salmonella species NOT DETECTED NOT DETECTED Final   Serratia marcescens NOT DETECTED NOT DETECTED Final   Haemophilus influenzae NOT DETECTED NOT DETECTED Final   Neisseria meningitidis NOT DETECTED NOT DETECTED Final   Pseudomonas aeruginosa NOT DETECTED NOT DETECTED Final   Stenotrophomonas maltophilia NOT DETECTED NOT DETECTED Final   Candida albicans NOT DETECTED NOT DETECTED Final   Candida auris NOT DETECTED NOT DETECTED Final   Candida glabrata NOT DETECTED NOT DETECTED Final   Candida krusei NOT DETECTED NOT DETECTED Final   Candida parapsilosis NOT DETECTED NOT DETECTED Final   Candida tropicalis NOT DETECTED NOT DETECTED Final   Cryptococcus neoformans/gattii NOT DETECTED NOT DETECTED Final   CTX-M ESBL NOT DETECTED NOT DETECTED Final   Carbapenem resistance IMP NOT DETECTED NOT DETECTED Final   Carbapenem resistance KPC NOT DETECTED NOT DETECTED Final   Carbapenem resistance NDM NOT DETECTED NOT DETECTED Final   Carbapenem resist OXA 48 LIKE NOT DETECTED NOT DETECTED Final   Carbapenem resistance VIM NOT  DETECTED NOT DETECTED Final    Comment: Performed at [facility], [address], [place], [place] [address]  Urine Culture     Status: Abnormal   Collection Time: 11/25/20  3:09 AM   Specimen: Urine, Clean Catch  Result Value Ref Range Status   Specimen Description   Final    URINE, CLEAN CATCH Performed at [facility], [address] [place], [place] [address]    Special Requests   Final    NONE Performed at [facility], [address] [place], [place] [address]    Culture >=100,000 COLONIES/mL ESCHERICHIA COLI (A)  Final   Report Status 11/27/2020 FINAL  Final   Organism ID, Bacteria ESCHERICHIA COLI (A)  Final      Susceptibility   Escherichia coli - MIC*    AMPICILLIN <=2 SENSITIVE Sensitive     CEFAZOLIN <=4 SENSITIVE Sensitive     CEFEPIME <=0.12 SENSITIVE Sensitive     CEFTRIAXONE <=0.25 SENSITIVE Sensitive     CIPROFLOXACIN <=0.25 SENSITIVE Sensitive     GENTAMICIN <=1 SENSITIVE Sensitive     IMIPENEM <=0.25 SENSITIVE Sensitive     NITROFURANTOIN <=16 SENSITIVE Sensitive     TRIMETH/SULFA <=20 SENSITIVE Sensitive     AMPICILLIN/SULBACTAM <=2 SENSITIVE Sensitive     PIP/TAZO <=4 SENSITIVE Sensitive     * >=100,000 COLONIES/mL ESCHERICHIA COLI  Culture, blood (x 2)     Status: None (Preliminary result)   Collection Time: 11/25/20  8:17 PM   Specimen: BLOOD  Result Value Ref Range Status   Specimen Description BLOOD RIGHT ANTECUBITAL  Final   Special Requests   Final    BOTTLES DRAWN AEROBIC AND ANAEROBIC Blood Culture adequate volume   Culture   Final    NO GROWTH 3 DAYS Performed at [facility], [address] [place], [place] [address]    Report Status PENDING  Incomplete  Culture, blood (x 2)     Status: None (Preliminary result)   Collection Time: 11/25/20  8:26 PM   Specimen: BLOOD  Result Value Ref Range Status   Specimen  Description BLOOD BLOOD LEFT FOREARM  Final   Special Requests   Final    BOTTLES DRAWN  AEROBIC AND ANAEROBIC Blood Culture adequate volume   Culture   Final    NO GROWTH 3 DAYS Performed at [facility], [address] [place], [place] [address]    Report Status PENDING  Incomplete  Urine Culture     Status: Abnormal   Collection Time: 11/25/20 11:26 PM   Specimen: PATH Other; Urine  Result Value Ref Range Status   Specimen Description   Final    PELVIS LEFT RENAL PELVIS CYSTOSCOPY Performed at [facility], [place], [place], [place] [address]    Special Requests   Final    NONE Performed at [facility], [place], [place] [address]    Culture 10,000 COLONIES/mL ESCHERICHIA COLI (A)  Final   Report Status 11/28/2020 FINAL  Final   Organism ID, Bacteria ESCHERICHIA COLI (A)  Final      Susceptibility   Escherichia coli - MIC*    AMPICILLIN <=2 SENSITIVE Sensitive     CEFAZOLIN <=4 SENSITIVE Sensitive     CEFEPIME <=0.12 SENSITIVE Sensitive     CEFTAZIDIME <=1 SENSITIVE Sensitive     CEFTRIAXONE <=0.25 SENSITIVE Sensitive     CIPROFLOXACIN <=0.25 SENSITIVE Sensitive     GENTAMICIN <=1 SENSITIVE Sensitive     IMIPENEM <=0.25 SENSITIVE Sensitive     TRIMETH/SULFA <=20 SENSITIVE Sensitive     AMPICILLIN/SULBACTAM <=2 SENSITIVE Sensitive     PIP/TAZO <=4 SENSITIVE Sensitive     * 10,000 COLONIES/mL ESCHERICHIA COLI     Time coordinating discharge: Over 30 minutes  SIGNED:   Shawna Clamp, MD  [facility] 11/28/2020, 11:04 AM Pager

## 2020-11-28 NOTE — Progress Notes (Signed)
Discharge instructions explained to pt/ verbalized an understanding/ iv and tele removed/ will transport off unit via wheelchair.  

## 2020-11-28 NOTE — Progress Notes (Signed)
Pt will come next Thursday to have her labs checked

## 2020-11-30 LAB — CULTURE, BLOOD (ROUTINE X 2)
Culture: NO GROWTH
Culture: NO GROWTH
Culture: NO GROWTH
Special Requests: ADEQUATE
Special Requests: ADEQUATE

## 2020-12-08 ENCOUNTER — Telehealth: Payer: Self-pay

## 2020-12-08 NOTE — Telephone Encounter (Signed)
Patient called stating she followed with her PCP who said her labs are WNL and so she would like to cancel seeing Dr Delaine Lame. She did ask who removes stent. I advised her that she sees Dr Darlys Gales on 12/24/20 11 am to discuss stent mngmnt. Canceled 12/09/20 Hosp follow up and notifed Dr Delaine Lame.

## 2020-12-09 ENCOUNTER — Inpatient Hospital Stay: Payer: Medicare HMO | Admitting: Infectious Diseases

## 2020-12-24 ENCOUNTER — Other Ambulatory Visit: Payer: Self-pay

## 2020-12-24 ENCOUNTER — Ambulatory Visit: Payer: Medicare HMO | Admitting: Urology

## 2020-12-24 ENCOUNTER — Encounter: Payer: Self-pay | Admitting: Urology

## 2020-12-24 VITALS — BP 135/74 | HR 83 | Ht 63.0 in | Wt 129.0 lb

## 2020-12-24 DIAGNOSIS — N130 Hydronephrosis with ureteropelvic junction obstruction: Secondary | ICD-10-CM | POA: Diagnosis not present

## 2020-12-24 DIAGNOSIS — Z87448 Personal history of other diseases of urinary system: Secondary | ICD-10-CM

## 2020-12-24 NOTE — Progress Notes (Signed)
12/24/2020 11:24 AM   Heather [name] 03-19-1935 [id number]  Referring provider: Rusty Aus, MD [facility] [place],  [place] [address]  Chief Complaint  Patient presents with   Other    HPI: 85 y.o. female who presents for hospital follow-up.  Long history of left UPJ obstruction dating back to 2015 which was asymptomatic Admitted [facility] 11/25/2020 with complaints of low back pain x2 weeks and left lower quadrant pain x2 days with chills, nausea and vomiting UA with microhematuria and pyuria She developed hypotension and elevated lactate and underwent urgent left ureteral stent placement with findings of pyonephrosis.  Retrograde pyelogram showed no filling defects Intraoperative urine culture from left renal pelvis grew E. coli She has been doing quite well since hospital discharge.  She has no stent symptoms and states she does not even know that the stent is present   PMH: Past Medical History:  Diagnosis Date   Brain aneurysm    Cancer [facility]) lung   2016   Carotid artery stenosis    Carotid stenosis 06/19/2013   Overview:  Normal study, 1/16   Colonic polyp    GERD (gastroesophageal reflux disease)    Hiatal hernia    Hypercholesteremia    Hyperlipidemia, mixed 06/09/2015   Hypertension    Lung nodule 06/21/2014   Macrocytic 10/06/2014   Medicare annual wellness visit, initial 07/08/2016   Overview:  6/18   PAT (paroxysmal atrial tachycardia) ([place]) 06/19/2013   Plantar fasciitis    Tachycardia, paroxysmal ([place]) 03/31/14    Surgical History: Past Surgical History:  Procedure Laterality Date   BREAST EXCISIONAL BIOPSY Right 40 yrs ago   neg   BREAST LUMPECTOMY     benign   CATARACT EXTRACTION W/ INTRAOCULAR LENS  IMPLANT, BILATERAL Bilateral    CEREBRAL ANEURYSM REPAIR  2004   CERVIX LESION DESTRUCTION     COLONOSCOPY WITH PROPOFOL N/A 03/31/2015   Procedure: COLONOSCOPY WITH PROPOFOL;  Surgeon: Manya Silvas, MD;   Location: [place];  Service: Endoscopy;  Laterality: N/A;   CYSTOSCOPY WITH STENT PLACEMENT Left 11/25/2020   Procedure: CYSTOSCOPY WITH STENT PLACEMENT;  Surgeon: Abbie Sons, MD;  Location: [facility];  Service: Urology;  Laterality: Left;   FLEXIBLE BRONCHOSCOPY N/A 03/10/2017   Procedure: FLEXIBLE BRONCHOSCOPY;  Surgeon: Nestor Lewandowsky, MD;  Location: [facility];  Service: Thoracic;  Laterality: N/A;   THORACOTOMY/LOBECTOMY Left 03/10/2017   Procedure: THORACOTOMY WITH LUNG WEDGE RESECTION POSSIBLE LOBECTOMY;  Surgeon: Nestor Lewandowsky, MD;  Location: [facility];  Service: Thoracic;  Laterality: Left;   TUBAL LIGATION      Home Medications:  Allergies as of 12/24/2020       Reactions   Ace Inhibitors Swelling   Contrast Media [iodinated Diagnostic Agents]    Cortisone    Patient had facial swelling and itching from cortisone injection IM        Medication List        Accurate as of December 24, 2020 11:24 AM. If you have any questions, ask your nurse or doctor.          STOP taking these medications    sulfamethoxazole-trimethoprim 800-160 MG tablet Commonly known as: BACTRIM DS Stopped by: Abbie Sons, MD       TAKE these medications    amLODipine 5 MG tablet Commonly known as: NORVASC Take 5 mg by mouth at bedtime.   atenolol 50 MG tablet Commonly known as: TENORMIN Take 50 mg by  mouth at bedtime.   calcium-vitamin D 500-200 MG-UNIT tablet Commonly known as: OSCAL WITH D Take 1 tablet by mouth.   cetirizine 10 MG tablet Commonly known as: ZYRTEC Take 10 mg by mouth daily.   Cholecalciferol 25 MCG (1000 UT) tablet Take 1,000 Units by mouth daily.   fluticasone 50 MCG/ACT nasal spray Commonly known as: FLONASE Place 1 spray into the nose daily as needed for allergies.   Fluticasone-Salmeterol 100-50 MCG/DOSE Aepb Commonly known as: ADVAIR Inhale into the lungs.   hydrochlorothiazide 25 MG tablet Commonly known as: HYDRODIURIL Take 25 mg  by mouth daily as needed.   simvastatin 20 MG tablet Commonly known as: ZOCOR Take 20 mg by mouth daily.   vitamin B-12 100 MCG tablet Commonly known as: CYANOCOBALAMIN Take 100 mcg by mouth daily.        Allergies:  Allergies  Allergen Reactions   Ace Inhibitors Swelling   Contrast Media [Iodinated Diagnostic Agents]    Cortisone     Patient had facial swelling and itching from cortisone injection IM    Family History: Family History  Problem Relation Age of Onset   Alcohol abuse Mother    Heart attack Father    Breast cancer Neg Hx     Social History:  reports that she quit smoking about 32 years ago. Her smoking use included cigarettes. She has never used smokeless tobacco. She reports current alcohol use. She reports that she does not use drugs.   Physical Exam: BP 135/74   Pulse 83   Ht _0  (1.6 m)   Wt 129 lb (58.5 kg)   BMI 22.85 kg/m   Constitutional:  Alert and oriented, No acute distress. HEENT: [place] AT, moist mucus membranes.  Trachea midline, no masses. Cardiovascular: No clubbing, cyanosis, or edema. Respiratory: Normal respiratory effort, no increased work of breathing. GI: Abdomen is soft, nontender, nondistended, no abdominal masses GU: No CVA tenderness Skin: No rashes, bruises or suspicious lesions. Neurologic: Grossly intact, no focal deficits, moving all 4 extremities. Psychiatric: Normal mood and affect.   Assessment & Plan:    1.  Left UPJ obstruction Recent episode of pyelonephritis with pyonephrosis We discussed the stent is temporary and would need periodic changes.  The option of laparoscopic pyeloplasty was also discussed She would initially like to undergo stent drainage with periodic changes We will tentatively schedule cystoscopy with stent exchange February 2023 and will place a Bard Optima stent at that time   Abbie Sons, MD  [place] [address], [place], [place]  [address] [phone]

## 2020-12-26 ENCOUNTER — Encounter: Payer: Self-pay | Admitting: Urology

## 2020-12-29 ENCOUNTER — Other Ambulatory Visit: Payer: Self-pay | Admitting: Urology

## 2020-12-29 DIAGNOSIS — N200 Calculus of kidney: Secondary | ICD-10-CM

## 2020-12-29 NOTE — Progress Notes (Signed)
Surgical Physician Order Form [facility]  * Scheduling expectation :  February 2023  *Length of Case: 30 minutes  *Clearance needed: no  *Anticoagulation Instructions: N/A  *Aspirin Instructions: N/A  *Post-op visit Date/Instructions:  3 month follow up  *Diagnosis: Left Nephrolithiasis  *Procedure: left  Cysto w/stent exchange (49969)   Additional orders: N/A  -Admit type: OUTpatient  -Anesthesia: Choice  -VTE Prophylaxis Standing Order SCD's       Other:   -Standing Lab Orders Per Anesthesia    Lab other: UA&Urine Culture  -Standing Test orders EKG/Chest x-ray per Anesthesia       Test other:   - Medications:  Ancef 2gm IV  -Other orders:  N/A

## 2021-01-27 NOTE — Progress Notes (Signed)
Will schedule mid January for February.

## 2021-02-10 ENCOUNTER — Telehealth: Payer: Self-pay

## 2021-02-10 NOTE — Progress Notes (Signed)
[facility] Posting Form   Surgery Date/Time: Date: 03/03/2021  Surgeon: Dr. John Giovanni, MD  Surgery Location: Day Surgery  Inpt ( No  )   Outpt (Yes)   Obs ( No  )   Diagnosis: N20.0 Left Nephrolithiasis  -CPT: 58[address]  Surgery: Left Cystoscopy with left stent exchange  Stop Anticoagulations: N/A  Cardiac/Medical/Pulmonary Clearance needed: no  *Orders entered into EPIC  Date: 02/10/21   *Case booked in EPIC  Date: 02/10/21  *Notified pt of Surgery: Date: 02/06/2021  PRE-OP UA & CX: Yes, will obtain on 02/17/2021, pt aware.  *Placed into Prior Authorization Work Que Date: 02/10/21   Assistant/laser/rep:No

## 2021-02-10 NOTE — Telephone Encounter (Signed)
I spoke with Heather [name]. We have discussed possible surgery dates and Tuesday February 7th, 2023 was agreed upon by all parties. Patient given information about surgery date, what to expect pre-operatively and post operatively.   We discussed that a Pre-Admission Testing office will be calling to set up the pre-op visit that will take place prior to surgery, and that these appointments are typically done over the phone with a Pre-Admissions RN. Informed patient that our office will communicate any additional care to be provided after surgery. Patients questions or concerns were discussed during our call.   Advised to call our office should there be any additional information, questions or concerns that arise. Patient verbalized understanding.

## 2021-02-13 DIAGNOSIS — J40 Bronchitis, not specified as acute or chronic: Secondary | ICD-10-CM

## 2021-02-13 HISTORY — DX: Bronchitis, not specified as acute or chronic: J40

## 2021-02-17 ENCOUNTER — Other Ambulatory Visit: Payer: Self-pay

## 2021-02-17 ENCOUNTER — Other Ambulatory Visit: Payer: Medicare HMO

## 2021-02-17 DIAGNOSIS — N200 Calculus of kidney: Secondary | ICD-10-CM

## 2021-02-17 LAB — MICROSCOPIC EXAMINATION: Bacteria, UA: NONE SEEN

## 2021-02-17 LAB — URINALYSIS, COMPLETE
Bilirubin, UA: NEGATIVE
Glucose, UA: NEGATIVE
Ketones, UA: NEGATIVE
Leukocytes,UA: NEGATIVE
Nitrite, UA: NEGATIVE
Protein,UA: NEGATIVE
Specific Gravity, UA: 1.025 (ref 1.005–1.030)
Urobilinogen, Ur: 0.2 mg/dL (ref 0.2–1.0)
pH, UA: 5.5 (ref 5.0–7.5)

## 2021-02-20 LAB — CULTURE, URINE COMPREHENSIVE

## 2021-02-23 ENCOUNTER — Other Ambulatory Visit: Payer: Self-pay

## 2021-02-23 ENCOUNTER — Other Ambulatory Visit: Payer: Self-pay | Admitting: *Deleted

## 2021-02-23 ENCOUNTER — Encounter
Admission: RE | Admit: 2021-02-23 | Discharge: 2021-02-23 | Disposition: A | Discharge status: Home / Self Care | Payer: Medicare HMO | Source: Ambulatory Visit | Attending: Urology | Admitting: Urology

## 2021-02-23 HISTORY — DX: Emphysema, unspecified: J43.9

## 2021-02-23 MED ORDER — CEFUROXIME AXETIL 500 MG PO TABS: 500.0000 mg | ORAL_TABLET | Freq: Two times a day (BID) | ORAL | 0 refills | Status: AC

## 2021-02-23 NOTE — Patient Instructions (Signed)
Your procedure is scheduled on:03-03-21 Tuesday Report to the Registration Desk on the 1st floor of the [employer]. To find out your arrival time, please call [phone] between 1PM - 3PM on:03-02-21 Monday  REMEMBER: Instructions that are not followed completely may result in serious medical risk, up to and including death; or upon the discretion of your surgeon and anesthesiologist your surgery may need to be rescheduled.  Do not eat food OR drink any liquids after midnight the night before surgery.  No gum chewing, lozengers or hard candies.  TAKE THESE MEDICATIONS THE MORNING OF SURGERY WITH A SIP OF WATER: -cetirizine (ZYRTEC)  -simvastatin (ZOCOR)   Use your Advair Inhaler the day of surgery  One week prior to surgery: Stop Anti-inflammatories (NSAIDS) such as Advil, Aleve, Ibuprofen, Motrin, Naproxen, Naprosyn and Aspirin based products such as Excedrin, Goodys Powder, BC Powder.You may however, continue to take Tylenol if needed for pain up until the day of surgery.  Stop ANY OVER THE COUNTER supplements/vitamins NOW (02-23-30) until after surgery (calcium-vitamin D, Cholecalciferol, vitamin B-12)  No Alcohol for 24 hours before or after surgery.  No Smoking including e-cigarettes for 24 hours prior to surgery.  No chewable tobacco products for at least 6 hours prior to surgery.  No nicotine patches on the day of surgery.  Do not use any "recreational" drugs for at least a week prior to your surgery.  Please be advised that the combination of cocaine and anesthesia may have negative outcomes, up to and including death. If you test positive for cocaine, your surgery will be cancelled.  On the morning of surgery brush your teeth with toothpaste and water, you may rinse your mouth with mouthwash if you wish. Do not swallow any toothpaste or mouthwash.  Do not wear jewelry, make-up, hairpins, clips or nail polish.  Do not wear lotions, powders, or perfumes.   Do not shave  body from the neck down 48 hours prior to surgery just in case you cut yourself which could leave a site for infection.  Also, freshly shaved skin may become irritated if using the CHG soap.  Contact lenses, hearing aids and dentures may not be worn into surgery.  Do not bring valuables to the hospital. [facility] is not responsible for any missing/lost belongings or valuables.  Notify your doctor if there is any change in your medical condition (cold, fever, infection).  Wear comfortable clothing (specific to your surgery type) to the hospital.  After surgery, you can help prevent lung complications by doing breathing exercises.  Take deep breaths and cough every 1-2 hours. Your doctor may order a device called an Incentive Spirometer to help you take deep breaths. When coughing or sneezing, hold a pillow firmly against your incision with both hands. This is called splinting. Doing this helps protect your incision. It also decreases belly discomfort.  If you are being admitted to the hospital overnight, leave your suitcase in the car. After surgery it may be brought to your room.  If you are being discharged the day of surgery, you will not be allowed to drive home. You will need a responsible adult (18 years or older) to drive you home and stay with you that night.   If you are taking public transportation, you will need to have a responsible adult (18 years or older) with you. Please confirm with your physician that it is acceptable to use public transportation.   Please call the [facility] at [phone] if  you have any questions about these instructions.  Surgery Visitation Policy:  Patients undergoing a surgery or procedure may have one family member or support person with them as long as that person is not COVID-19 positive or experiencing its symptoms.  That person may remain in the waiting area during the procedure and may rotate out with other  people.  Inpatient Visitation:    Visiting hours are 7 a.m. to 8 p.m. Up to two visitors ages 16+ are allowed at one time in a patient room. The visitors may rotate out with other people during the day. Visitors must check out when they leave, or other visitors will not be allowed. One designated support person may remain overnight. The visitor must pass COVID-19 screenings, use hand sanitizer when entering and exiting the patients room and wear a mask at all times, including in the patients room. Patients must also wear a mask when staff or their visitor are in the room. Masking is required regardless of vaccination status.

## 2021-02-23 NOTE — Progress Notes (Signed)
Please let Heather [name] know that her urine culture grew out bacteria that is sometimes associated with a skin contaminant, but since she is scheduled for an upcoming procedure we will go ahead and prescribe Ceftin 500 mg twice daily for ten days.   Notified patient as instructed, patient pleased. Discussed follow-up appointments, patient agrees

## 2021-03-03 ENCOUNTER — Ambulatory Visit
Admission: RE | Admit: 2021-03-03 | Discharge: 2021-03-03 | Disposition: A | Discharge status: Home / Self Care | Payer: Medicare HMO | Attending: Urology | Admitting: Urology

## 2021-03-03 ENCOUNTER — Ambulatory Visit: Payer: Medicare HMO

## 2021-03-03 ENCOUNTER — Encounter
Admission: RE | Disposition: A | Discharge status: Home / Self Care | Payer: Self-pay | Source: Home / Self Care | Attending: Urology

## 2021-03-03 ENCOUNTER — Ambulatory Visit: Payer: Medicare HMO | Admitting: Anesthesiology

## 2021-03-03 ENCOUNTER — Encounter: Payer: Self-pay | Admitting: Urology

## 2021-03-03 ENCOUNTER — Other Ambulatory Visit: Payer: Self-pay

## 2021-03-03 DIAGNOSIS — N136 Pyonephrosis: Secondary | ICD-10-CM | POA: Diagnosis present

## 2021-03-03 DIAGNOSIS — Z85118 Personal history of other malignant neoplasm of bronchus and lung: Secondary | ICD-10-CM | POA: Diagnosis not present

## 2021-03-03 DIAGNOSIS — Z87442 Personal history of urinary calculi: Secondary | ICD-10-CM | POA: Diagnosis not present

## 2021-03-03 DIAGNOSIS — Z902 Acquired absence of lung [part of]: Secondary | ICD-10-CM | POA: Diagnosis not present

## 2021-03-03 DIAGNOSIS — Z8679 Personal history of other diseases of the circulatory system: Secondary | ICD-10-CM | POA: Insufficient documentation

## 2021-03-03 DIAGNOSIS — I739 Peripheral vascular disease, unspecified: Secondary | ICD-10-CM | POA: Diagnosis not present

## 2021-03-03 DIAGNOSIS — I6529 Occlusion and stenosis of unspecified carotid artery: Secondary | ICD-10-CM | POA: Diagnosis not present

## 2021-03-03 DIAGNOSIS — N1330 Unspecified hydronephrosis: Secondary | ICD-10-CM

## 2021-03-03 DIAGNOSIS — J449 Chronic obstructive pulmonary disease, unspecified: Secondary | ICD-10-CM | POA: Insufficient documentation

## 2021-03-03 DIAGNOSIS — Z87891 Personal history of nicotine dependence: Secondary | ICD-10-CM | POA: Diagnosis not present

## 2021-03-03 DIAGNOSIS — K219 Gastro-esophageal reflux disease without esophagitis: Secondary | ICD-10-CM | POA: Insufficient documentation

## 2021-03-03 DIAGNOSIS — I471 Supraventricular tachycardia: Secondary | ICD-10-CM | POA: Insufficient documentation

## 2021-03-03 DIAGNOSIS — N201 Calculus of ureter: Secondary | ICD-10-CM

## 2021-03-03 DIAGNOSIS — N200 Calculus of kidney: Secondary | ICD-10-CM

## 2021-03-03 DIAGNOSIS — I10 Essential (primary) hypertension: Secondary | ICD-10-CM | POA: Diagnosis not present

## 2021-03-03 DIAGNOSIS — Z8619 Personal history of other infectious and parasitic diseases: Secondary | ICD-10-CM | POA: Insufficient documentation

## 2021-03-03 HISTORY — PX: CYSTOSCOPY W/ URETERAL STENT PLACEMENT: SHX1429

## 2021-03-03 SURGERY — CYSTOSCOPY, FLEXIBLE, WITH STENT REPLACEMENT
Anesthesia: General | Site: Ureter | Laterality: Left

## 2021-03-03 MED ORDER — CEFAZOLIN SODIUM-DEXTROSE 2-4 GM/100ML-% IV SOLN
INTRAVENOUS | Status: AC
  Filled 2021-03-03: qty 100

## 2021-03-03 MED ORDER — ONDANSETRON HCL 4 MG/2ML IJ SOLN: 4.0000 mg | Freq: Once | INTRAMUSCULAR | Status: DC | PRN

## 2021-03-03 MED ORDER — ORAL CARE MOUTH RINSE: 15.0000 mL | Freq: Once | OROMUCOSAL | Status: AC

## 2021-03-03 MED ORDER — ACETAMINOPHEN 10 MG/ML IV SOLN: 1000.0000 mg | Freq: Once | INTRAVENOUS | Status: DC | PRN

## 2021-03-03 MED ORDER — PROPOFOL 10 MG/ML IV BOLUS
INTRAVENOUS | Status: AC
  Filled 2021-03-03: qty 20

## 2021-03-03 MED ORDER — FENTANYL CITRATE (PF) 100 MCG/2ML IJ SOLN
INTRAMUSCULAR | Status: AC
  Filled 2021-03-03: qty 2

## 2021-03-03 MED ORDER — LACTATED RINGERS IV SOLN: INTRAVENOUS | Status: DC

## 2021-03-03 MED ORDER — PROPOFOL 10 MG/ML IV BOLUS
INTRAVENOUS | Status: DC | PRN
Start: 2021-03-03 — End: 2021-03-03
  Administered 2021-03-03: 150 mg via INTRAVENOUS

## 2021-03-03 MED ORDER — ONDANSETRON HCL 4 MG/2ML IJ SOLN
INTRAMUSCULAR | Status: AC
  Filled 2021-03-03: qty 2

## 2021-03-03 MED ORDER — FAMOTIDINE 20 MG PO TABS
20.0000 mg | ORAL_TABLET | Freq: Once | ORAL | Status: AC
Start: 2021-03-03 — End: 2021-03-03

## 2021-03-03 MED ORDER — OXYCODONE HCL 5 MG/5ML PO SOLN: 5.0000 mg | Freq: Once | ORAL | Status: DC | PRN

## 2021-03-03 MED ORDER — LIDOCAINE HCL (PF) 2 % IJ SOLN
INTRAMUSCULAR | Status: DC | PRN
  Administered 2021-03-03: 50 mg

## 2021-03-03 MED ORDER — CEFAZOLIN SODIUM-DEXTROSE 1-4 GM/50ML-% IV SOLN
INTRAVENOUS | Status: AC
  Filled 2021-03-03: qty 50

## 2021-03-03 MED ORDER — SODIUM CHLORIDE 0.9 % IR SOLN
Status: DC | PRN
  Administered 2021-03-03: 1000 mL

## 2021-03-03 MED ORDER — DEXAMETHASONE SODIUM PHOSPHATE 10 MG/ML IJ SOLN
INTRAMUSCULAR | Status: AC
  Filled 2021-03-03: qty 1

## 2021-03-03 MED ORDER — FENTANYL CITRATE (PF) 100 MCG/2ML IJ SOLN
INTRAMUSCULAR | Status: DC | PRN
  Administered 2021-03-03 (×2): 50 ug via INTRAVENOUS

## 2021-03-03 MED ORDER — ONDANSETRON HCL 4 MG/2ML IJ SOLN
INTRAMUSCULAR | Status: DC | PRN
Start: 2021-03-03 — End: 2021-03-03
  Administered 2021-03-03: 4 mg via INTRAVENOUS

## 2021-03-03 MED ORDER — OXYCODONE HCL 5 MG PO TABS: 5.0000 mg | ORAL_TABLET | Freq: Once | ORAL | Status: DC | PRN

## 2021-03-03 MED ORDER — CHLORHEXIDINE GLUCONATE 0.12 % MT SOLN: 15.0000 mL | Freq: Once | OROMUCOSAL | Status: AC

## 2021-03-03 MED ORDER — CHLORHEXIDINE GLUCONATE 0.12 % MT SOLN
OROMUCOSAL | Status: AC
  Administered 2021-03-03: 15 mL via OROMUCOSAL
  Filled 2021-03-03: qty 15

## 2021-03-03 MED ORDER — LIDOCAINE HCL (PF) 2 % IJ SOLN
INTRAMUSCULAR | Status: AC
  Filled 2021-03-03: qty 5

## 2021-03-03 MED ORDER — IOPAMIDOL (ISOVUE-200) INJECTION 41%
INTRAVENOUS | Status: DC | PRN
  Administered 2021-03-03: 10 mg via INTRAVENOUS

## 2021-03-03 MED ORDER — FAMOTIDINE 20 MG PO TABS
ORAL_TABLET | ORAL | Status: AC
  Administered 2021-03-03: 20 mg via ORAL
  Filled 2021-03-03: qty 1

## 2021-03-03 MED ORDER — GLYCOPYRROLATE 0.2 MG/ML IJ SOLN
INTRAMUSCULAR | Status: AC
  Filled 2021-03-03: qty 1

## 2021-03-03 MED ORDER — FENTANYL CITRATE (PF) 100 MCG/2ML IJ SOLN: 25.0000 ug | INTRAMUSCULAR | Status: DC | PRN

## 2021-03-03 MED ORDER — CEFAZOLIN SODIUM-DEXTROSE 2-4 GM/100ML-% IV SOLN
2.0000 g | INTRAVENOUS | Status: AC
  Administered 2021-03-03: 2 g via INTRAVENOUS

## 2021-03-03 MED ORDER — DEXAMETHASONE SODIUM PHOSPHATE 10 MG/ML IJ SOLN
INTRAMUSCULAR | Status: DC | PRN
  Administered 2021-03-03: 10 mg via INTRAVENOUS

## 2021-03-03 SURGICAL SUPPLY — 20 items
BAG DRAIN CYSTO-URO LG1000N (MISCELLANEOUS) ×1 IMPLANT
BRUSH SCRUB EZ 1% IODOPHOR (MISCELLANEOUS) ×2 IMPLANT
CATH URETL OPEN 5X70 (CATHETERS) IMPLANT
CATH URETL OPEN END 6X70 (CATHETERS) ×1 IMPLANT
GAUZE 4X4 16PLY ~~LOC~~+RFID DBL (SPONGE) ×4 IMPLANT
GLOVE SURG UNDER POLY LF SZ7.5 (GLOVE) ×2 IMPLANT
GOWN STRL REUS W/ TWL XL LVL3 (GOWN DISPOSABLE) ×1 IMPLANT
GOWN STRL REUS W/TWL XL LVL3 (GOWN DISPOSABLE) ×2
GUIDEWIRE STR DUAL SENSOR (WIRE) ×2 IMPLANT
IV NS IRRIG 3000ML ARTHROMATIC (IV SOLUTION) ×2 IMPLANT
KIT TURNOVER CYSTO (KITS) ×2 IMPLANT
MANIFOLD NEPTUNE II (INSTRUMENTS) ×2 IMPLANT
PACK CYSTO AR (MISCELLANEOUS) ×2 IMPLANT
SET CYSTO W/LG BORE CLAMP LF (SET/KITS/TRAYS/PACK) ×2 IMPLANT
STENT URET 6FRX24 CONTOUR (STENTS) IMPLANT
STENT URET 6FRX26 CONTOUR (STENTS) IMPLANT
STENT URO INLAY 6FRX22CM (STENTS) ×1 IMPLANT
SURGILUBE 2OZ TUBE FLIPTOP (MISCELLANEOUS) ×2 IMPLANT
WATER STERILE IRR 1000ML POUR (IV SOLUTION) ×2 IMPLANT
WATER STERILE IRR 500ML POUR (IV SOLUTION) ×2 IMPLANT

## 2021-03-03 NOTE — Anesthesia Procedure Notes (Signed)
Procedure Name: LMA Insertion Date/Time: 03/03/2021 7:40 AM Performed by: Rolla Plate, CRNA Pre-anesthesia Checklist: Patient identified, Patient being monitored, Timeout performed, Emergency Drugs available and Suction available Patient Re-evaluated:Patient Re-evaluated prior to induction Oxygen Delivery Method: Circle system utilized Preoxygenation: Pre-oxygenation with 100% oxygen Induction Type: IV induction Ventilation: Mask ventilation without difficulty LMA: LMA inserted LMA Size: 3.0 Tube type: Oral Number of attempts: 1 Placement Confirmation: positive ETCO2 and breath sounds checked- equal and bilateral Tube secured with: Tape Dental Injury: Teeth and Oropharynx as per pre-operative assessment

## 2021-03-03 NOTE — Discharge Instructions (Addendum)
DISCHARGE INSTRUCTIONS FOR KIDNEY STONE/URETERAL STENT   MEDICATIONS:  1. Resume all your other meds from home.  2.  AZO (over-the-counter) can help with the burning/stinging when you urinate.   ACTIVITY:  1. May resume regular activities in 24 hours. 2. No driving while on narcotic pain medications  3. Drink plenty of water  4. Continue to walk at home - you can still get blood clots when you are at home, so keep active, but don't over do it.  5. May return to work/school tomorrow or when you feel ready   SIGNS/SYMPTOMS TO CALL:  Common postoperative symptoms include urinary frequency, urgency, bladder spasm and blood in the urine  Please call us if you have a fever greater than 101.5, uncontrolled nausea/vomiting, uncontrolled pain, dizziness, unable to urinate, excessively bloody urine, chest pain, shortness of breath, leg swelling, leg pain, or any other concerns or questions.   You can reach [place] at [phone].   FOLLOW-UP:  1. You we will be contacted for a follow-up appointment with x-ray in 6 months  [place]   The drugs that you were given will stay in your system until tomorrow so for the next 24 hours you should not:  Drive an automobile Make any legal decisions Drink any alcoholic beverage   You may resume regular meals tomorrow.  Today it is better to start with liquids and gradually work up to solid foods.  You may eat anything you prefer, but it is better to start with liquids, then soup and crackers, and gradually work up to solid foods.   Please notify your doctor immediately if you have any unusual bleeding, trouble breathing, redness and pain at the surgery site, drainage, fever, or pain not relieved by medication.    Additional Instructions:        Please contact your physician with any problems or Same Day Surgery at [phone], Monday through Friday 6 am to 4 pm, or [place] at [facility] number at  [phone].

## 2021-03-03 NOTE — Transfer of Care (Signed)
Immediate Anesthesia Transfer of Care Note  Patient: LUCCA GREGGS  Procedure(s) Performed: CYSTOSCOPY WITH EXCHANGE (Left: Ureter)  Patient Location: PACU  Anesthesia Type:General  Level of Consciousness: awake and alert   Airway & Oxygen Therapy: Patient Spontanous Breathing  Post-op Assessment: Report given to RN and Post -op Vital signs reviewed and stable  Post vital signs: Reviewed  Last Vitals:  Vitals Value Taken Time  BP    Temp    Pulse    Resp 9 03/03/21 0804  SpO2    Vitals shown include unvalidated device data.  Last Pain:  Vitals:   03/03/21 0625  PainSc: 0-No pain      Patients Stated Pain Goal: 0 (96/92/49 3241)  Complications: No notable events documented.

## 2021-03-03 NOTE — H&P (Signed)
Urology History and Physical    History of Present Illness: Heather [name] is a 86 y.o. with chronic left UPJ obstruction who presents for stent exchange.  Long history of left UPJ obstruction dating back to 2015 which was asymptomatic Admitted [facility] 11/25/2020 with complaints of low back pain x2 weeks and left lower quadrant pain x2 days with chills, nausea and vomiting UA with microhematuria and pyuria She developed hypotension and elevated lactate and underwent urgent left ureteral stent placement with findings of pyonephrosis.  Retrograde pyelogram showed no filling defects Intraoperative urine culture from left renal pelvis grew E. Coli She elected periodic stent change and presents today for same.  Preop urine culture grew strep consistent with colonization.  She has been treated with antibiotics.  Past Medical History:  Diagnosis Date   Brain aneurysm    Bronchitis 02/13/2021   saw Dr Emily Filbert   Cancer [facility]) lung   2016   Carotid artery stenosis    Carotid stenosis 06/19/2013   Overview:  Normal study, 1/16   Colonic polyp    Emphysema lung ([place])    GERD (gastroesophageal reflux disease)    Hiatal hernia    Hypercholesteremia    Hyperlipidemia, mixed 06/09/2015   Hypertension    Lung nodule 06/21/2014   Macrocytic 10/06/2014   Medicare annual wellness visit, initial 07/08/2016   Overview:  6/18   PAT (paroxysmal atrial tachycardia) ([place]) 06/19/2013   Plantar fasciitis    Tachycardia, paroxysmal ([place]) 03/31/2014    Past Surgical History:  Procedure Laterality Date   BREAST EXCISIONAL BIOPSY Right 40 yrs ago   neg   BREAST LUMPECTOMY     benign   CATARACT EXTRACTION W/ INTRAOCULAR LENS  IMPLANT, BILATERAL Bilateral    CEREBRAL ANEURYSM REPAIR  2004   CERVIX LESION DESTRUCTION     COLONOSCOPY WITH PROPOFOL N/A 03/31/2015   Procedure: COLONOSCOPY WITH PROPOFOL;  Surgeon: Manya Silvas, MD;  Location: [place];  Service: Endoscopy;  Laterality: N/A;    CYSTOSCOPY WITH STENT PLACEMENT Left 11/25/2020   Procedure: CYSTOSCOPY WITH STENT PLACEMENT;  Surgeon: Abbie Sons, MD;  Location: [facility];  Service: Urology;  Laterality: Left;   FLEXIBLE BRONCHOSCOPY N/A 03/10/2017   Procedure: FLEXIBLE BRONCHOSCOPY;  Surgeon: Nestor Lewandowsky, MD;  Location: [facility];  Service: Thoracic;  Laterality: N/A;   THORACOTOMY/LOBECTOMY Left 03/10/2017   Procedure: THORACOTOMY WITH LUNG WEDGE RESECTION POSSIBLE LOBECTOMY;  Surgeon: Nestor Lewandowsky, MD;  Location: [facility];  Service: Thoracic;  Laterality: Left;   TUBAL LIGATION      Home Medications:  Current Meds  Medication Sig   amLODipine (NORVASC) 5 MG tablet Take 5 mg by mouth at bedtime.   atenolol (TENORMIN) 50 MG tablet Take 50 mg by mouth at bedtime.   calcium-vitamin D (OSCAL WITH D) 500-200 MG-UNIT tablet Take 1 tablet by mouth daily with breakfast.   cefUROXime (CEFTIN) 500 MG tablet Take 1 tablet (500 mg total) by mouth 2 (two) times daily with a meal for 10 days.   cetirizine (ZYRTEC) 10 MG tablet Take 10 mg by mouth every morning.   Cholecalciferol 1000 UNITS tablet Take 1,000 Units by mouth daily.   dextromethorphan-guaiFENesin (MUCINEX DM) 30-600 MG 12hr tablet Take 1 tablet by mouth 2 (two) times daily.   fluticasone (FLONASE) 50 MCG/ACT nasal spray Place 1 spray into the nose daily as needed for allergies.    Fluticasone-Salmeterol (ADVAIR) 100-50 MCG/DOSE AEPB Inhale 1 puff into the lungs as needed.   simvastatin (  ZOCOR) 20 MG tablet Take 20 mg by mouth every morning.   vitamin B-12 (CYANOCOBALAMIN) 100 MCG tablet Take 100 mcg by mouth daily.    Allergies:  Allergies  Allergen Reactions   Ace Inhibitors Swelling   Contrast Media [Iodinated Contrast Media]    Cortisone     Patient had facial swelling and itching from cortisone injection IM    Family History  Problem Relation Age of Onset   Alcohol abuse Mother    Heart attack Father    Breast cancer Neg Hx     Social History:   reports that she quit smoking about 32 years ago. Her smoking use included cigarettes. She has a 35.00 pack-year smoking history. She has never used smokeless tobacco. She reports current alcohol use. She reports that she does not use drugs.  ROS: No fever, chills, chest pain, shortness of breath  Physical Exam:  Vital signs in last 24 hours: Temp:  [97 F (36.1 C)] 97 F (36.1 C) (02/07 0625) Pulse Rate:  [88] 88 (02/07 0625) Resp:  [20] 20 (02/07 0625) BP: (164)/(78) 164/78 (02/07 0625) SpO2:  [96 %] 96 % (02/07 0625) Weight:  [59 kg] 59 kg (02/07 0625) Constitutional:  Alert and oriented, No acute distress HEENT: [place] AT, moist mucus membranes.  Trachea midline, no masses Cardiovascular: Regular rate and rhythm, no clubbing, cyanosis, or edema. Respiratory: Normal respiratory effort, lungs clear bilaterally GI: Abdomen is soft, nontender, nondistended, no abdominal masses GU: No CVA tenderness Skin: No rashes, bruises or suspicious lesions Lymph: No cervical or inguinal adenopathy Neurologic: Grossly intact, no focal deficits, moving all 4 extremities Psychiatric: Normal mood and affect   Laboratory Data:  No results for input(s): WBC, HGB, HCT in the last 72 hours. No results for input(s): NA, K, CL, CO2, GLUCOSE, BUN, CREATININE, CALCIUM in the last 72 hours. No results for input(s): LABPT, INR in the last 72 hours. No results for input(s): LABURIN in the last 72 hours. Results for orders placed or performed in visit on 02/17/21  CULTURE, URINE COMPREHENSIVE     Status: Abnormal   Collection Time: 02/17/21  9:10 AM   Specimen: Urine   UR  Result Value Ref Range Status   Urine Culture, Comprehensive Final report (A)  Final   Organism ID, Bacteria Comment (A)  Final    Comment: Staphylococcus epidermidis Based on susceptibility to oxacillin this isolate would be susceptible to: *Penicillinase-stable penicillins, such as:   Cloxacillin, Dicloxacillin,  Nafcillin *Beta-lactam combination agents, such as:   Amoxicillin-clavulanic acid, Ampicillin-sulbactam,   Piperacillin-tazobactam *Oral cephems, such as:   Cefaclor, Cefdinir, Cefpodoxime, Cefprozil, Cefuroxime,   Cephalexin, Loracarbef *Parenteral cephems, such as:   Cefazolin, Cefepime, Cefotaxime, Cefotetan, Ceftaroline,   Ceftizoxime, Ceftriaxone, Cefuroxime *Carbapenems, such as:   Doripenem, Ertapenem, Imipenem, Meropenem 25,000-50,000 colony forming units per mL    Organism ID, Bacteria Comment  Final    Comment: Mixed urogenital flora 5,000  Colonies/mL    ANTIMICROBIAL SUSCEPTIBILITY Comment  Final    Comment:       ** S = Susceptible; I = Intermediate; R = Resistant **                    P = Positive; N = Negative             MICS are expressed in micrograms per mL    Antibiotic                 RSLT#1  RSLT#2    RSLT#3    RSLT#4 Ciprofloxacin                  R Gentamicin                     S Levofloxacin                   R Linezolid                      S Moxifloxacin                   R Nitrofurantoin                 S Oxacillin                      S Penicillin                     R Quinupristin/Dalfopristin      S Rifampin                       S Tetracycline                   S Trimethoprim/Sulfa             R Vancomycin                     S   Microscopic Examination     Status: Abnormal   Collection Time: 02/17/21  9:10 AM   Urine  Result Value Ref Range Status   WBC, UA 0-5 0 - 5 /hpf Final   RBC 3-10 (A) 0 - 2 /hpf Final   Epithelial Cells (non renal) 0-10 0 - 10 /hpf Final   Bacteria, UA None seen None seen/Few Final     Radiologic Imaging: No results found.  Impression/Assessment:  Left UPJ obstruction with history of pyelonephritis/sepsis  Plan:  Cystoscopy with stent exchange  03/03/2021, 7:14 AM  John Giovanni,  MD

## 2021-03-03 NOTE — Anesthesia Preprocedure Evaluation (Addendum)
Anesthesia Evaluation  Patient identified by MRN, date of birth, ID band Patient awake    Reviewed: Allergy & Precautions, NPO status , Patient's Chart, lab work & pertinent test results  History of Anesthesia Complications (+) PONV and history of anesthetic complications  Airway Mallampati: II   Neck ROM: Full    Dental no notable dental hx.    Pulmonary COPD, former smoker,  Lung CA s/p lobectomy 2019   Pulmonary exam normal breath sounds clear to auscultation       Cardiovascular hypertension, + Peripheral Vascular Disease (carotid stenosis)  Normal cardiovascular exam+ dysrhythmias (paroxysmal atrial tachycardia)  Rhythm:Regular Rate:Normal  ECG 11/27/20:  Sinus rhythm with Premature supraventricular complexes Otherwise normal ECG  Echo 10/30/20:  NORMAL LEFT VENTRICULAR SYSTOLIC FUNCTION  NORMAL RIGHT VENTRICULAR SYSTOLIC FUNCTION  MILD VALVULAR REGURGITATION   NO VALVULAR STENOSIS   Neuro/Psych Cerebral aneurysm s/p repair 2004    GI/Hepatic hiatal hernia, GERD  ,  Endo/Other  negative endocrine ROS  Renal/GU Renal disease (nephrolithiasis)     Musculoskeletal  (+) Arthritis ,   Abdominal   Peds  Hematology negative hematology ROS (+)   Anesthesia Other Findings   Reproductive/Obstetrics                            Anesthesia Physical Anesthesia Plan  ASA: 3  Anesthesia Plan: General   Post-op Pain Management:    Induction: Intravenous  PONV Risk Score and Plan: 3 and Ondansetron, Dexamethasone and Treatment may vary due to age or medical condition  Airway Management Planned: LMA  Additional Equipment:   Intra-op Plan:   Post-operative Plan: Extubation in OR  Informed Consent: I have reviewed the patients History and Physical, chart, labs and discussed the procedure including the risks, benefits and alternatives for the proposed anesthesia with the patient or  authorized representative who has indicated his/her understanding and acceptance.     Dental advisory given  Plan Discussed with: CRNA  Anesthesia Plan Comments: (Patient consented for risks of anesthesia including but not limited to:  - adverse reactions to medications - damage to eyes, teeth, lips or other oral mucosa - nerve damage due to positioning  - sore throat or hoarseness - damage to heart, brain, nerves, lungs, other parts of body or loss of life  Informed patient about role of CRNA in peri- and intra-operative care.  Patient voiced understanding.)       Anesthesia Quick Evaluation

## 2021-03-03 NOTE — Anesthesia Postprocedure Evaluation (Signed)
Anesthesia Post Note  Patient: SAUNDRA GIN  Procedure(s) Performed: CYSTOSCOPY WITH EXCHANGE (Left: Ureter)  Patient location during evaluation: PACU Anesthesia Type: General Level of consciousness: awake and alert, oriented and patient cooperative Pain management: pain level controlled Vital Signs Assessment: post-procedure vital signs reviewed and stable Respiratory status: spontaneous breathing, nonlabored ventilation and respiratory function stable Cardiovascular status: blood pressure returned to baseline and stable Postop Assessment: adequate PO intake Anesthetic complications: no   No notable events documented.   Last Vitals:  Vitals:   03/03/21 0815 03/03/21 0829  BP: (!) 154/68 (!) 158/84  Pulse: 72 84  Resp: 17 14  Temp:  36.7 C  SpO2: 95% 93%    Last Pain:  Vitals:   03/03/21 0829  TempSrc: Temporal  PainSc: 0-No pain                 Darrin Nipper

## 2021-03-03 NOTE — Interval H&P Note (Signed)
History and Physical Interval Note:  03/03/2021 7:16 AM  Heather [name]  has presented today for surgery, with the diagnosis of Left Nephrolithiasis.  The various methods of treatment have been discussed with the patient and family. After consideration of risks, benefits and other options for treatment, the patient has consented to  Procedure(s): CYSTOSCOPY WITH EXCHANGE (Left) as a surgical intervention.  The patient's history has been reviewed, patient examined, no change in status, stable for surgery.  I have reviewed the patient's chart and labs.  Questions were answered to the patient's satisfaction.     [place]

## 2021-03-03 NOTE — Op Note (Signed)
Preoperative diagnosis:  Left UPJ obstruction  Postoperative diagnosis:  Same  Procedure: Cystoscopy with left ureteral stent exchange Left retrograde pyelogram  Surgeon: Abbie Sons, MD  Anesthesia: General  Complications: None  Intraoperative findings:  Left retrograde pyelogram-normal caliber ureter with marked dilation left renal pelvis  EBL: None  Specimens: None  Indication: Heather [name] is a 86 y.o. female with chronic left UPJ obstruction and episode of pyelonephritis/pyonephrosis with sepsis November 2022.  She has elected chronic indwelling stent with periodic stent changes.  After reviewing the management options for treatment, he elected to proceed with the above surgical procedure(s). We have discussed the potential benefits and risks of the procedure, side effects of the proposed treatment, the likelihood of the patient achieving the goals of the procedure, and any potential problems that might occur during the procedure or recuperation. Informed consent has been obtained.  Description of procedure:  The patient was taken to the operating room and general anesthesia was induced.  The patient was placed in the dorsal lithotomy position, prepped and draped in the usual sterile fashion, and preoperative antibiotics were administered. A preoperative time-out was performed.   A 21 French cystoscope was lubricated and passed per urethra.  Panendoscopy was performed and the bladder mucosa was without erythema, solid or papillary lesions.  No significant inflammatory changes secondary to the indwelling stent were noted.  The distal end of stent was not encrusted and was grasped with endoscopic forceps and brought out to the urethral meatus.  A 0.038 Sensor wire was placed through the stent and advanced into the renal pelvis under fluoroscopic guidance without difficulty.  The prior stent was removed.  A Bard Optima 40F/22 cm double-J ureteral stent was then advanced over  the wire and fluoroscopic guidance.  There was good curl seen in the renal pelvis and bladder under fluoroscopy.  After anesthetic reversal she was transported to the PACU in stable condition  Plan: Office follow-up 6 months with KUB Tentative stent exchange 1 year   Abbie Sons, M.D.

## 2021-03-13 ENCOUNTER — Telehealth: Payer: Self-pay | Admitting: *Deleted

## 2021-03-13 NOTE — Telephone Encounter (Signed)
Spoke with patient and informed her that CT chest without contrast is scheduled and ordered. Informed patient to disregard premeds. Patient verbalized understanding. Was grateful for the callback.

## 2021-03-13 NOTE — Telephone Encounter (Signed)
Patient informed that CT is without contrast. She thanked me and said she is no longer worried

## 2021-03-13 NOTE — Telephone Encounter (Signed)
Patient called stating that she got notification that she is scheduled for a CT with contrast Monday and she states she is allergic to contrast. Please advise

## 2021-03-16 ENCOUNTER — Other Ambulatory Visit: Payer: Self-pay

## 2021-03-16 ENCOUNTER — Ambulatory Visit
Admission: RE | Admit: 2021-03-16 | Discharge: 2021-03-16 | Disposition: A | Discharge status: Home / Self Care | Payer: Medicare HMO | Source: Ambulatory Visit | Attending: Oncology | Admitting: Oncology

## 2021-03-16 ENCOUNTER — Inpatient Hospital Stay: Payer: Medicare HMO | Attending: Oncology

## 2021-03-16 DIAGNOSIS — C3432 Malignant neoplasm of lower lobe, left bronchus or lung: Secondary | ICD-10-CM | POA: Diagnosis not present

## 2021-03-16 DIAGNOSIS — C3411 Malignant neoplasm of upper lobe, right bronchus or lung: Secondary | ICD-10-CM | POA: Insufficient documentation

## 2021-03-16 DIAGNOSIS — C3431 Malignant neoplasm of lower lobe, right bronchus or lung: Secondary | ICD-10-CM | POA: Insufficient documentation

## 2021-03-16 DIAGNOSIS — D751 Secondary polycythemia: Secondary | ICD-10-CM | POA: Insufficient documentation

## 2021-03-16 DIAGNOSIS — Z87891 Personal history of nicotine dependence: Secondary | ICD-10-CM | POA: Diagnosis not present

## 2021-03-16 DIAGNOSIS — I10 Essential (primary) hypertension: Secondary | ICD-10-CM | POA: Insufficient documentation

## 2021-03-16 DIAGNOSIS — J432 Centrilobular emphysema: Secondary | ICD-10-CM | POA: Diagnosis not present

## 2021-03-16 LAB — COMPREHENSIVE METABOLIC PANEL
ALT: 16 U/L (ref 0–44)
AST: 24 U/L (ref 15–41)
Albumin: 4.4 g/dL (ref 3.5–5.0)
Alkaline Phosphatase: 57 U/L (ref 38–126)
Anion gap: 7 (ref 5–15)
BUN: 19 mg/dL (ref 8–23)
CO2: 28 mmol/L (ref 22–32)
Calcium: 9.6 mg/dL (ref 8.9–10.3)
Chloride: 105 mmol/L (ref 98–111)
Creatinine, Ser: 0.82 mg/dL (ref 0.44–1.00)
GFR, Estimated: >60 mL/min (ref >60)
Glucose, Bld: 113 mg/dL — ABNORMAL HIGH (ref 70–99)
Potassium: 3.9 mmol/L (ref 3.5–5.1)
Sodium: 140 mmol/L (ref 135–145)
Total Bilirubin: 0.5 mg/dL (ref 0.3–1.2)
Total Protein: 7.3 g/dL (ref 6.5–8.1)

## 2021-03-16 LAB — CBC WITH DIFFERENTIAL/PLATELET
Abs Immature Granulocytes: 0.01 10*3/uL (ref 0.00–0.07)
Basophils Absolute: 0 10*3/uL (ref 0.0–0.1)
Basophils Relative: 1 %
Eosinophils Absolute: 0.2 10*3/uL (ref 0.0–0.5)
Eosinophils Relative: 3 %
HCT: 48.2 % — ABNORMAL HIGH (ref 36.0–46.0)
Hemoglobin: 16 g/dL — ABNORMAL HIGH (ref 12.0–15.0)
Immature Granulocytes: 0 %
Lymphocytes Relative: 36 %
Lymphs Abs: 2.3 10*3/uL (ref 0.7–4.0)
MCH: 32.9 pg (ref 26.0–34.0)
MCHC: 33.2 g/dL (ref 30.0–36.0)
MCV: 99.2 fL (ref 80.0–100.0)
Monocytes Absolute: 0.5 10*3/uL (ref 0.1–1.0)
Monocytes Relative: 8 %
Neutro Abs: 3.3 10*3/uL (ref 1.7–7.7)
Neutrophils Relative %: 52 %
Platelets: 177 10*3/uL (ref 150–400)
RBC: 4.86 MIL/uL (ref 3.87–5.11)
RDW: 12 % (ref 11.5–15.5)
WBC: 6.3 10*3/uL (ref 4.0–10.5)
nRBC: 0 % (ref 0.0–0.2)

## 2021-03-18 ENCOUNTER — Encounter (INDEPENDENT_AMBULATORY_CARE_PROVIDER_SITE_OTHER): Payer: Self-pay

## 2021-03-18 ENCOUNTER — Encounter: Payer: Self-pay | Admitting: Oncology

## 2021-03-18 ENCOUNTER — Other Ambulatory Visit: Payer: Self-pay

## 2021-03-18 ENCOUNTER — Inpatient Hospital Stay: Payer: Medicare HMO

## 2021-03-18 ENCOUNTER — Inpatient Hospital Stay: Payer: Medicare HMO | Admitting: Oncology

## 2021-03-18 VITALS — BP 155/64 | HR 62 | Temp 96.1°F | Resp 18 | Wt 135.2 lb

## 2021-03-18 DIAGNOSIS — D751 Secondary polycythemia: Secondary | ICD-10-CM | POA: Diagnosis not present

## 2021-03-18 DIAGNOSIS — C3411 Malignant neoplasm of upper lobe, right bronchus or lung: Secondary | ICD-10-CM | POA: Diagnosis not present

## 2021-03-18 DIAGNOSIS — J432 Centrilobular emphysema: Secondary | ICD-10-CM

## 2021-03-18 DIAGNOSIS — C3431 Malignant neoplasm of lower lobe, right bronchus or lung: Secondary | ICD-10-CM | POA: Diagnosis not present

## 2021-03-18 NOTE — Progress Notes (Signed)
Pt here for follow up. No new concerns voiced.   

## 2021-03-18 NOTE — Progress Notes (Signed)
No phlebotomy required today per Dr Tasia Catchings.

## 2021-03-18 NOTE — Progress Notes (Signed)
[facility] day:  03/18/2021    Chief Complaint: Heather [name] is a 86 y.o. 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  PERTINENT ONCOLOGY HISTORY Previously follows up with Dr.Corcoran. [facility] care with me on 03/15/2018  PET scan on 02/13/2014 revealed a 1 cm right upper lobe hypermetabolic nodule (SUV 3.8) and a 6 mm right upper lobe nodule (no metabolic activity). There was no mediastianl adenopathy.  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 Pathology revealed a 1.1 cm high-grade adenocarcinoma with pleural invasion. Nodes were negative. Pathologic stage was T2aN1M0 (stage IB).  Chest CT on 02/10/2017 revealed clear interval progression of the posterior left lower lobe pulmonary nodule, now measuring 12 mm in long axis and having a distinctly lobular contour. Imaging features were very concerning for neoplasm.  There was no change in the 7 mm nodule posterior to the right hilum.  PET scan on 02/18/2017 revealed a 11 x 8 mm posterior left lower lobe pulmonary nodule that had mildly progressed from prior studies and demonstrated mild hypermetabolism (SUV 1.6).  The appearance was considered worrisome for an indolent primary bronchogenic neoplasm.  03/10/2017.  s/p wedge resection of a left lower lobe mass.   Pathology revealed an 8 mm invasive adenocarcinoma with solid and acinar patterns.  There was no visceral invasion.  There was no lymphovascular invasion.  All margins were negative.  Pathologic stage was T1aNx (stage IA).   Chest CT on 09/07/2017 revealed stable postsurgical change in the RIGHT upper lobe and LEFT lower lobe without evidence local recurrence.  There was a stable 8 mm nodule in the central RIGHT upper lobe.  There was upper lobe  centrilobular emphysema.  CEA has been followed: 4.7 on 10/01/2014, 4.5 on 07/31/2015, 3.9 on 05/03/2016, and 3.9 on 02/11/2017.  Bilateral mammogram on 07/22/2016 revealed no evidence of malignancy.   INTERVAL HISTORY Heather [name] is a 86 y.o. female who has above history reviewed by me today presents for follow up visit for management of history of lung cancer, 6 months assessment.  Patient reports feeling well.  She was accompanied by her husband. Denies shortness of breath, fatigue, hemoptysis, unintentional weight loss.     Past Medical History:  Diagnosis Date   Brain aneurysm    Bronchitis 02/13/2021   saw Dr Emily Filbert   Cancer [facility]) lung   2016   Carotid artery stenosis    Carotid stenosis 06/19/2013   Overview:  Normal study, 1/16   Colonic polyp    Emphysema lung ([place])    GERD (gastroesophageal reflux disease)    Hiatal hernia    Hypercholesteremia    Hyperlipidemia, mixed 06/09/2015   Hypertension    Lung nodule 06/21/2014   Macrocytic 10/06/2014   Medicare annual wellness visit, initial 07/08/2016   Overview:  6/18   PAT (paroxysmal atrial tachycardia) ([place]) 06/19/2013   Plantar fasciitis    Tachycardia, paroxysmal ([place]) 03/31/2014    Past Surgical History:  Procedure Laterality Date   BREAST EXCISIONAL BIOPSY Right 40 yrs ago   neg   BREAST LUMPECTOMY     benign   CATARACT EXTRACTION W/ INTRAOCULAR LENS  IMPLANT, BILATERAL Bilateral    CEREBRAL ANEURYSM REPAIR  2004   CERVIX LESION DESTRUCTION     COLONOSCOPY WITH PROPOFOL  N/A 03/31/2015   Procedure: COLONOSCOPY WITH PROPOFOL;  Surgeon: Manya Silvas, MD;  Location[facility] ENDOSCOPY;  Service: Endoscopy;  Laterality: N/A;   CYSTOSCOPY W/ URETERAL STENT PLACEMENT Left 03/03/2021   Procedure: CYSTOSCOPY WITH EXCHANGE;  Surgeon: Abbie Sons, MD;  Location: [facility] ORS;  Service: Urology;  Laterality: Left;   CYSTOSCOPY WITH STENT PLACEMENT Left 11/25/2020   Procedure: CYSTOSCOPY WITH STENT PLACEMENT;   Surgeon: Abbie Sons, MD;  Location: [facility] ORS;  Service: Urology;  Laterality: Left;   FLEXIBLE BRONCHOSCOPY N/A 03/10/2017   Procedure: FLEXIBLE BRONCHOSCOPY;  Surgeon: Nestor Lewandowsky, MD;  Location: [facility] ORS;  Service: Thoracic;  Laterality: N/A;   THORACOTOMY/LOBECTOMY Left 03/10/2017   Procedure: THORACOTOMY WITH LUNG WEDGE RESECTION POSSIBLE LOBECTOMY;  Surgeon: Nestor Lewandowsky, MD;  Location: [facility] ORS;  Service: Thoracic;  Laterality: Left;   TUBAL LIGATION      Family History  Problem Relation Age of Onset   Alcohol abuse Mother    Heart attack Father    Breast cancer Neg Hx     Social History:  reports that she quit smoking about 32 years ago. Her smoking use included cigarettes. She has a 35.00 pack-year smoking history. She has never used smokeless tobacco. She reports current alcohol use. She reports that she does not use drugs.   Allergies:  Allergies  Allergen Reactions   Ace Inhibitors Swelling   Contrast Media [Iodinated Contrast Media]    Cortisone     Patient had facial swelling and itching from cortisone injection IM    Current Medications: Current Outpatient Medications  Medication Sig Dispense Refill   amLODipine (NORVASC) 5 MG tablet Take 5 mg by mouth at bedtime.     atenolol (TENORMIN) 50 MG tablet Take 50 mg by mouth at bedtime.     calcium-vitamin D (OSCAL WITH D) 500-200 MG-UNIT tablet Take 1 tablet by mouth daily with breakfast.     cetirizine (ZYRTEC) 10 MG tablet Take 10 mg by mouth every morning.     Cholecalciferol 1000 UNITS tablet Take 1,000 Units by mouth daily.     fluticasone (FLONASE) 50 MCG/ACT nasal spray Place 1 spray into the nose daily as needed for allergies.      Fluticasone-Salmeterol (ADVAIR) 100-50 MCG/DOSE AEPB Inhale 1 puff into the lungs as needed.     simvastatin (ZOCOR) 20 MG tablet Take 20 mg by mouth every morning.     vitamin B-12 (CYANOCOBALAMIN) 100 MCG tablet Take 100 mcg by mouth daily.     dextromethorphan-guaiFENesin  (MUCINEX DM) 30-600 MG 12hr tablet Take 1 tablet by mouth 2 (two) times daily. (Patient not taking: Reported on 03/18/2021)     No current facility-administered medications for this visit.   Facility-Administered Medications Ordered in Other Visits  Medication Dose Route Frequency Provider Last Rate Last Admin   0.9 %  sodium chloride infusion   Intravenous Continuous Manya Silvas, MD       Review of Systems  Constitutional:  Negative for appetite change, chills, fatigue and fever.  HENT:   Positive for hearing loss. Negative for voice change.   Eyes:  Negative for eye problems.  Respiratory:  Negative for chest tightness and cough.   Cardiovascular:  Negative for chest pain.  Gastrointestinal:  Negative for abdominal distention, abdominal pain and blood in stool.  Endocrine: Negative for hot flashes.  Genitourinary:  Negative for difficulty urinating and frequency.   Musculoskeletal:  Negative for arthralgias.       Chronic intermittent back  pain  Skin:  Negative for itching and rash.  Neurological:  Negative for extremity weakness.  Hematological:  Negative for adenopathy.  Psychiatric/Behavioral:  Negative for confusion.    Performance status (ECOG): 0 - Asymptomatic  Vital Signs BP (!) 155/64 (BP Location: Left Arm, Patient Position: Sitting)    Pulse 62    Temp (!) 96.1 F (35.6 C)    Resp 18    Wt 135 lb 3.2 oz (61.3 kg)    SpO2 97%    BMI 23.95 kg/m   Physical Exam Constitutional:      General: She is not in acute distress.    Appearance: She is not diaphoretic.  HENT:     Head: Normocephalic and atraumatic.     Mouth/Throat:     Pharynx: No oropharyngeal exudate.  Eyes:     General: No scleral icterus.    Pupils: Pupils are equal, round, and reactive to light.  Cardiovascular:     Rate and Rhythm: Normal rate and regular rhythm.     Heart sounds: No murmur heard. Pulmonary:     Effort: Pulmonary effort is normal. No respiratory distress.  Abdominal:      General: There is no distension.     Palpations: Abdomen is soft.     Tenderness: There is no abdominal tenderness.  Musculoskeletal:        General: Normal range of motion.     Cervical back: Normal range of motion and neck supple.  Skin:    General: Skin is warm and dry.     Findings: No erythema.  Neurological:     Mental Status: She is alert and oriented to person, place, and time.  Psychiatric:        Mood and Affect: Affect normal.    RADIOGRAPHIC STUDIES: I have personally reviewed the radiological images as listed and agreed with the findings in the report. CT CHEST WO CONTRAST  Result Date: 03/17/2021 CLINICAL DATA:  Follow-up lung cancer. EXAM: CT CHEST WITHOUT CONTRAST TECHNIQUE: Multidetector CT imaging of the chest was performed following the standard protocol without IV contrast. RADIATION DOSE REDUCTION: This exam was performed according to the departmental dose-optimization program which includes automated exposure control, adjustment of the mA and/or kV according to patient size and/or use of iterative reconstruction technique. COMPARISON:  CT scan 09/11/2020 FINDINGS: Cardiovascular: The heart is normal in size. No pericardial effusion. Stable aortic and branch vessel calcifications but no aneurysm. Stable dense calcifications along the mitral valve and stable coronary artery calcifications. Mediastinum/Nodes: Stable mediastinal and hilar lymph nodes. The largest node is in the precarinal space and measures 7.5 mm. No mass or overt adenopathy. The esophagus is grossly normal. There is a small hiatal hernia. Lungs/Pleura: Stable postoperative changes involving the right upper lobe and left lower lobe from previous wedge resections. No findings suspicious for recurrent tumor. Stable 7 mm pulmonary nodule in the superior segment of the right lower lobe in the azygo esophageal recess. Stable sub solid nodular density in the left upper lobe on image number 32/3. Stable peripheral  right lower lobe and lingular scarring changes. Stable mild nodularity along the left major fissure. No new/acute pulmonary findings. Upper Abdomen: No significant upper abdominal findings. Stable advanced vascular calcifications. Stable hepatic cysts. No adrenal gland lesions. Persistent left-sided hydronephrosis likely due to chronic UPJ obstruction. Musculoskeletal: No significant bony findings. Stable degenerative changes involving the lumbar spine. IMPRESSION: 1. Stable postoperative changes involving the right upper lobe and left lower lobe from previous wedge  resections. No findings suspicious for recurrent tumor. 2. Stable 7 mm right lower lobe pulmonary nodule. 3. Stable sub solid nodular density in the left upper lobe. 4. No new/acute pulmonary findings. 5. Stable mediastinal and hilar lymph nodes. 6. Stable advanced vascular calcifications. 7. Persistent left-sided hydronephrosis likely due to chronic UPJ obstruction. Aortic Atherosclerosis (ICD10-I70.0) and Emphysema (ICD10-J43.9). Electronically Signed   By: Marijo Sanes M.D.   On: 03/17/2021 14:04   DG OR UROLOGY CYSTO IMAGE ([facility] ONLY)  Result Date: 03/03/2021 There is no interpretation for this exam.  This order is for images obtained during a surgical procedure.  Please See "Surgeries" Tab for more information regarding the procedure.     Laboratory results were reviewed by me CBC Latest Ref Rng & Units 03/16/2021 11/28/2020 11/27/2020  WBC 4.0 - 10.5 K/uL 6.3 12.0(H) 14.4(H)  Hemoglobin 12.0 - 15.0 g/dL 16.0(H) 11.5(L) 12.2  Hematocrit 36.0 - 46.0 % 48.2(H) 34.8(L) 37.1  Platelets 150 - 400 K/uL 177 120(L) 110(L)   CMP Latest Ref Rng & Units 03/16/2021 11/28/2020 11/27/2020  Glucose 70 - 99 mg/dL 113(H) 103(H) 74  BUN 8 - 23 mg/dL 19 22 25(H)  Creatinine 0.44 - 1.00 mg/dL 0.82 0.76 1.04(H)  Sodium 135 - 145 mmol/L 140 139 138  Potassium 3.5 - 5.1 mmol/L 3.9 3.5 3.6  Chloride 98 - 111 mmol/L 105 105 105  CO2 22 - 32 mmol/L _0 Calcium 8.9 - 10.3 mg/dL 9.6 8.4(L) 8.4(L)  Total Protein 6.5 - 8.1 g/dL 7.3 - -  Total Bilirubin 0.3 - 1.2 mg/dL 0.5 - -  Alkaline Phos 38 - 126 U/L 57 - -  AST 15 - 41 U/L 24 - -  ALT 0 - 44 U/L 16 - -     Assessment:  Heather [name] is a 86 y.o. 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 Pathology revealed a 1.1 cm high-grade adenocarcinoma with pleural invasion. Nodes were negative. Pathologic stage was T2aN1M0 (stage IB).  She is s/p wedge resection of a left lower lobe mass on 03/10/2017.  Pathology revealed an 8 mm invasive adenocarcinoma with solid and acinar patterns.  There was no visceral invasion.  There was no lymphovascular invasion.  All margins were negative.  Pathologic stage was T1aNx (stage IA).   1. Primary cancer of right upper lobe of lung ([place])   2. Erythrocytosis   3. Centrilobular emphysema ([facility])    #History of stage Ib right lung upper lobe adenocarcinoma status post wedge resection in 2016 History of left lower lobe adenocarcinoma T1 a NX status post wedge resection 2019. Clinically patient is doing very well 03/16/2021 CT chest without contrast showed no evidence of cancer recurrence. She has been 4 years post her lung cancer surgery. I recommend annual CT scan.   Intermittent erythrocytosis.  JAK2 V617F mutation negative, with reflex to other mutations CALR, MPL, JAK 2 Ex 12-15 mutations negative. Negative BCR ABL. Most likely this is secondary erythrocytosis.  I suspect that this may be secondary to hypoxia due to underlying chronic lung diseases/emphysema or sleep apnea.  I reviewed the lab results, explained to patient.  Recommend patient to further discuss with her primary care provider.  No need for phlebotomy given that hematocrit is less than 52.  #Centrilobular emphysema, continue singular and Advair inhalers.  RTC after CT scan in 12 months for MD assessment and labs (CBC with diff,  CMP). Orders Placed This Encounter  Procedures   CT Chest Wo Contrast    Standing Status:   Future    Standing Expiration Date:   03/18/2022    Order Specific Question:   Preferred imaging location?    Answer:   [facility]   We spent sufficient time to discuss many aspect of care, questions were answered to patient's satisfaction.   Earlie Server, MD, PhD Hematology Oncology 03/18/2021

## 2021-04-03 ENCOUNTER — Encounter: Payer: Self-pay | Admitting: Urology

## 2021-06-03 ENCOUNTER — Ambulatory Visit: Payer: Medicare HMO | Admitting: Urology

## 2021-09-03 ENCOUNTER — Ambulatory Visit: Payer: Medicare HMO | Admitting: Urology

## 2021-09-16 ENCOUNTER — Encounter: Payer: Self-pay | Admitting: Urology

## 2021-09-16 ENCOUNTER — Ambulatory Visit: Payer: Medicare HMO | Admitting: Urology

## 2021-09-16 ENCOUNTER — Other Ambulatory Visit: Payer: Self-pay | Admitting: Family Medicine

## 2021-09-16 VITALS — BP 154/71 | HR 65 | Ht 63.0 in | Wt 132.0 lb

## 2021-09-16 DIAGNOSIS — N135 Crossing vessel and stricture of ureter without hydronephrosis: Secondary | ICD-10-CM

## 2021-09-16 DIAGNOSIS — N200 Calculus of kidney: Secondary | ICD-10-CM

## 2021-09-16 LAB — URINALYSIS, COMPLETE
Bilirubin, UA: NEGATIVE
Glucose, UA: NEGATIVE
Ketones, UA: NEGATIVE
Nitrite, UA: POSITIVE — AB
Specific Gravity, UA: 1.025 (ref 1.005–1.030)
Urobilinogen, Ur: 0.2 mg/dL (ref 0.2–1.0)
pH, UA: 5 (ref 5.0–7.5)

## 2021-09-16 LAB — MICROSCOPIC EXAMINATION
RBC, Urine: >30 /hpf — AB (ref 0–2)
WBC, UA: >30 /hpf — AB (ref 0–5)

## 2021-09-17 ENCOUNTER — Encounter: Payer: Self-pay | Admitting: Urology

## 2021-09-17 ENCOUNTER — Ambulatory Visit
Admission: RE | Admit: 2021-09-17 | Discharge: 2021-09-17 | Disposition: A | Discharge status: Home / Self Care | Payer: Medicare HMO | Attending: Urology | Admitting: Urology

## 2021-09-17 ENCOUNTER — Ambulatory Visit
Admission: RE | Admit: 2021-09-17 | Discharge: 2021-09-17 | Disposition: A | Discharge status: Home / Self Care | Payer: Medicare HMO | Source: Ambulatory Visit | Attending: Urology | Admitting: Urology

## 2021-09-17 DIAGNOSIS — N200 Calculus of kidney: Secondary | ICD-10-CM | POA: Insufficient documentation

## 2021-09-17 DIAGNOSIS — N135 Crossing vessel and stricture of ureter without hydronephrosis: Secondary | ICD-10-CM | POA: Diagnosis present

## 2021-09-17 DIAGNOSIS — K5900 Constipation, unspecified: Secondary | ICD-10-CM | POA: Diagnosis not present

## 2021-09-17 NOTE — Progress Notes (Signed)
09/16/2021 9:31 AM   Barrie Folk 11/25/1935 [id number]  Referring provider: Rusty Aus, MD [place] [facility] [place],  [place] [address]  Chief Complaint  Patient presents with   Follow-up   Urologic history: 1.  Left UPJ obstruction Initially asymptomatic and elected observation Admitted 11/2020 with pyelonephritis and underwent left ureteral stent placement with findings of pyonephrosis Elected chronic stent drainage   HPI: 86 y.o. female who presents for 6 month follow-up visit.  Stent last exchanged 03/03/2021 ([facility]) No complaints No bothersome LUTS   PMH: Past Medical History:  Diagnosis Date   Brain aneurysm    Bronchitis 02/13/2021   saw Dr Emily Filbert   Cancer [facility]) lung   2016   Carotid artery stenosis    Carotid stenosis 06/19/2013   Overview:  Normal study, 1/16   Colonic polyp    Emphysema lung ([place])    GERD (gastroesophageal reflux disease)    Hiatal hernia    Hypercholesteremia    Hyperlipidemia, mixed 06/09/2015   Hypertension    Lung nodule 06/21/2014   Macrocytic 10/06/2014   Medicare annual wellness visit, initial 07/08/2016   Overview:  6/18   PAT (paroxysmal atrial tachycardia) ([place]) 06/19/2013   Plantar fasciitis    Tachycardia, paroxysmal ([place]) 03/31/2014    Surgical History: Past Surgical History:  Procedure Laterality Date   BREAST EXCISIONAL BIOPSY Right 40 yrs ago   neg   BREAST LUMPECTOMY     benign   CATARACT EXTRACTION W/ INTRAOCULAR LENS  IMPLANT, BILATERAL Bilateral    CEREBRAL ANEURYSM REPAIR  2004   CERVIX LESION DESTRUCTION     COLONOSCOPY WITH PROPOFOL N/A 03/31/2015   Procedure: COLONOSCOPY WITH PROPOFOL;  Surgeon: Manya Silvas, MD;  Location: [facility];  Service: Endoscopy;  Laterality: N/A;   CYSTOSCOPY W/ URETERAL STENT PLACEMENT Left 03/03/2021   Procedure: CYSTOSCOPY WITH EXCHANGE;  Surgeon: Abbie Sons, MD;  Location: [facility] ORS;  Service: Urology;   Laterality: Left;   CYSTOSCOPY WITH STENT PLACEMENT Left 11/25/2020   Procedure: CYSTOSCOPY WITH STENT PLACEMENT;  Surgeon: Abbie Sons, MD;  Location: [facility] ORS;  Service: Urology;  Laterality: Left;   FLEXIBLE BRONCHOSCOPY N/A 03/10/2017   Procedure: FLEXIBLE BRONCHOSCOPY;  Surgeon: Nestor Lewandowsky, MD;  Location: [facility] ORS;  Service: Thoracic;  Laterality: N/A;   THORACOTOMY/LOBECTOMY Left 03/10/2017   Procedure: THORACOTOMY WITH LUNG WEDGE RESECTION POSSIBLE LOBECTOMY;  Surgeon: Nestor Lewandowsky, MD;  Location: [facility] ORS;  Service: Thoracic;  Laterality: Left;   TUBAL LIGATION      Home Medications:  Allergies as of 09/16/2021       Reactions   Ace Inhibitors Swelling   Contrast Media [iodinated Contrast Media]    Cortisone    Patient had facial swelling and itching from cortisone injection IM        Medication List        Accurate as of September 16, 2021 11:59 PM. If you have any questions, ask your nurse or doctor.          amLODipine 5 MG tablet Commonly known as: NORVASC Take 5 mg by mouth at bedtime.   atenolol 50 MG tablet Commonly known as: TENORMIN Take 50 mg by mouth at bedtime.   calcium-vitamin D 500-200 MG-UNIT tablet Commonly known as: OSCAL WITH D Take 1 tablet by mouth daily with breakfast.   cetirizine 10 MG tablet Commonly known as: ZYRTEC Take 10 mg by mouth every morning.   Cholecalciferol 25  MCG (1000 UT) tablet Take 1,000 Units by mouth daily.   cyanocobalamin 100 MCG tablet Commonly known as: VITAMIN B12 Take 100 mcg by mouth daily.   dextromethorphan-guaiFENesin 30-600 MG 12hr tablet Commonly known as: MUCINEX DM Take 1 tablet by mouth 2 (two) times daily.   fluticasone 50 MCG/ACT nasal spray Commonly known as: FLONASE Place 1 spray into the nose daily as needed for allergies.   Fluticasone-Salmeterol 100-50 MCG/DOSE Aepb Commonly known as: ADVAIR Inhale 1 puff into the lungs as needed.   simvastatin 20 MG tablet Commonly known as:  ZOCOR Take 20 mg by mouth every morning.        Allergies:  Allergies  Allergen Reactions   Ace Inhibitors Swelling   Contrast Media [Iodinated Contrast Media]    Cortisone     Patient had facial swelling and itching from cortisone injection IM    Family History: Family History  Problem Relation Age of Onset   Alcohol abuse Mother    Heart attack Father    Breast cancer Neg Hx     Social History:  reports that she quit smoking about 33 years ago. Her smoking use included cigarettes. She has a 35.00 pack-year smoking history. She has never used smokeless tobacco. She reports current alcohol use. She reports that she does not use drugs.   Physical Exam: BP (!) 154/71   Pulse 65   Ht _0  (1.6 m)   Wt 132 lb (59.9 kg)   BMI 23.38 kg/m   Constitutional:  Alert and oriented, No acute distress. HEENT: [place] AT, moist mucus membranes.  Trachea midline, no masses. Cardiovascular: No clubbing, cyanosis, or edema. Respiratory: Normal respiratory effort, no increased work of breathing. GI: Abdomen is soft, nontender, nondistended, no abdominal masses GU: No CVA tenderness Skin: No rashes, bruises or suspicious lesions. Neurologic: Grossly intact, no focal deficits, moving all 4 extremities. Psychiatric: Normal mood and affect.   Assessment & Plan:    1.  Left UPJ obstruction KUB ordered to assess for stent encrustation If no significant encrustation noted we will plan a follow-up KUB in 3 months If no findings of encrustation on serial KUBs will plan stent exchange February 2024   Abbie Sons, [place] [address], [place], [place] [address] [phone]

## 2021-09-21 ENCOUNTER — Other Ambulatory Visit: Payer: Self-pay | Admitting: *Deleted

## 2021-09-21 ENCOUNTER — Telehealth: Payer: Self-pay | Admitting: *Deleted

## 2021-09-21 DIAGNOSIS — N200 Calculus of kidney: Secondary | ICD-10-CM

## 2021-09-21 LAB — CULTURE, URINE COMPREHENSIVE

## 2021-09-21 NOTE — Telephone Encounter (Signed)
-----   Message from Abbie Sons, MD sent at 09/20/2021 10:04 AM EDT ----- KUB shows stent in good position and no encrustation present.  Please put in an order for a repeat KUB to be performed in 3 months and will call with results.

## 2021-09-21 NOTE — Telephone Encounter (Signed)
Notified patient as instructed, patient pleased. patient agrees  

## 2021-12-08 ENCOUNTER — Encounter: Payer: Self-pay | Admitting: Physician Assistant

## 2021-12-08 ENCOUNTER — Ambulatory Visit
Admission: RE | Admit: 2021-12-08 | Discharge: 2021-12-08 | Disposition: A | Discharge status: Home / Self Care | Payer: Medicare HMO | Source: Ambulatory Visit | Attending: Urology | Admitting: Urology

## 2021-12-08 ENCOUNTER — Ambulatory Visit: Payer: Medicare HMO | Admitting: Physician Assistant

## 2021-12-08 ENCOUNTER — Ambulatory Visit
Admission: RE | Admit: 2021-12-08 | Discharge: 2021-12-08 | Disposition: A | Discharge status: Home / Self Care | Payer: Medicare HMO | Attending: Urology | Admitting: Urology

## 2021-12-08 VITALS — BP 151/84 | HR 64 | Ht 63.0 in | Wt 134.0 lb

## 2021-12-08 DIAGNOSIS — N135 Crossing vessel and stricture of ureter without hydronephrosis: Secondary | ICD-10-CM

## 2021-12-08 DIAGNOSIS — N200 Calculus of kidney: Secondary | ICD-10-CM | POA: Insufficient documentation

## 2021-12-08 DIAGNOSIS — R310 Gross hematuria: Secondary | ICD-10-CM | POA: Diagnosis not present

## 2021-12-08 LAB — URINALYSIS, COMPLETE
Bilirubin, UA: NEGATIVE
Glucose, UA: NEGATIVE
Ketones, UA: NEGATIVE
Nitrite, UA: POSITIVE — AB
Specific Gravity, UA: 1.025 (ref 1.005–1.030)
Urobilinogen, Ur: 0.2 mg/dL (ref 0.2–1.0)
pH, UA: 5.5 (ref 5.0–7.5)

## 2021-12-08 LAB — MICROSCOPIC EXAMINATION: WBC, UA: >30 /hpf — AB (ref 0–5)

## 2021-12-08 NOTE — Progress Notes (Unsigned)
12/08/2021 1:01 PM   Heather [name] 03-May-1935 [id number]  CC: Chief Complaint  Patient presents with   Flank Pain   HPI: Heather [name] is a 86 y.o. female with PMH left UPJ obstruction managed with chronic left ureteral stent and urinary colonization who presents today for evaluation of gross hematuria.   Today she reports gross hematuria without clots that started this morning.  She has also had some low back pain this week, but admits that she has been walking more so she assumed this was musculoskeletal.  She denies dysuria, fever, chills, nausea, or vomiting.  She was due for 24-monthfollow-up KUB today, which I asked her to obtain prior to her visit.  No evidence of stent encrustation on KUB.  Stent appears to be in appropriate position.  In-office UA today positive for 2+ blood, 2+ protein, nitrites, and 2+ leukocytes; urine microscopy with >30 WBCs/HPF, 11-30 RBCs/HPF, and many bacteria.   PMH: Past Medical History:  Diagnosis Date   Brain aneurysm    Bronchitis 02/13/2021   saw Dr MEmily Filbert  Cancer ([facility] lung   2016   Carotid artery stenosis    Carotid stenosis 06/19/2013   Overview:  Normal study, 1/16   Colonic polyp    Emphysema lung ([facility]    GERD (gastroesophageal reflux disease)    Hiatal hernia    Hypercholesteremia    Hyperlipidemia, mixed 06/09/2015   Hypertension    Lung nodule 06/21/2014   Macrocytic 10/06/2014   Medicare annual wellness visit, initial 07/08/2016   Overview:  6/18   PAT (paroxysmal atrial tachycardia) 06/19/2013   Plantar fasciitis    Tachycardia, paroxysmal ([facility] 03/31/2014    Surgical History: Past Surgical History:  Procedure Laterality Date   BREAST EXCISIONAL BIOPSY Right 40 yrs ago   neg   BREAST LUMPECTOMY     benign   CATARACT EXTRACTION W/ INTRAOCULAR LENS  IMPLANT, BILATERAL Bilateral    CEREBRAL ANEURYSM REPAIR  2004   CERVIX LESION DESTRUCTION     COLONOSCOPY WITH PROPOFOL N/A 03/31/2015   Procedure:  COLONOSCOPY WITH PROPOFOL;  Surgeon: RManya Silvas MD;  [place] [facility]  Service: Endoscopy;  Laterality: N/A;   CYSTOSCOPY W/ URETERAL STENT PLACEMENT Left 03/03/2021   Procedure: CYSTOSCOPY WITH EXCHANGE;  Surgeon: SAbbie Sons MD;  Location: [facility];  Service: Urology;  Laterality: Left;   CYSTOSCOPY WITH STENT PLACEMENT Left 11/25/2020   Procedure: CYSTOSCOPY WITH STENT PLACEMENT;  Surgeon: SAbbie Sons MD;  Location: [facility];  Service: Urology;  Laterality: Left;   FLEXIBLE BRONCHOSCOPY N/A 03/10/2017   Procedure: FLEXIBLE BRONCHOSCOPY;  Surgeon: ONestor Lewandowsky MD;  Location: [facility];  Service: Thoracic;  Laterality: N/A;   THORACOTOMY/LOBECTOMY Left 03/10/2017   Procedure: THORACOTOMY WITH LUNG WEDGE RESECTION POSSIBLE LOBECTOMY;  Surgeon: ONestor Lewandowsky MD;  Location: [facility];  Service: Thoracic;  Laterality: Left;   TUBAL LIGATION      Home Medications:  Allergies as of 12/08/2021       Reactions   Ace Inhibitors Swelling   Contrast Media [iodinated Contrast Media]    Cortisone    Patient had facial swelling and itching from cortisone injection IM        Medication List        Accurate as of December 08, 2021  1:01 PM. If you have any questions, ask your nurse or doctor.          amLODipine 5 MG tablet Commonly known as: NORVASC Take  5 mg by mouth at bedtime.   atenolol 50 MG tablet Commonly known as: TENORMIN Take 50 mg by mouth at bedtime.   calcium-vitamin D 500-200 MG-UNIT tablet Commonly known as: OSCAL WITH D Take 1 tablet by mouth daily with breakfast.   cetirizine 10 MG tablet Commonly known as: ZYRTEC Take 10 mg by mouth every morning.   Cholecalciferol 25 MCG (1000 UT) tablet Take 1,000 Units by mouth daily.   cyanocobalamin 100 MCG tablet Commonly known as: VITAMIN B12 Take 100 mcg by mouth daily.   dextromethorphan-guaiFENesin 30-600 MG 12hr tablet Commonly known as: MUCINEX DM Take 1 tablet by mouth 2 (two) times  daily.   fluticasone 50 MCG/ACT nasal spray Commonly known as: FLONASE Place 1 spray into the nose daily as needed for allergies.   Fluticasone-Salmeterol 100-50 MCG/DOSE Aepb Commonly known as: ADVAIR Inhale 1 puff into the lungs as needed.   simvastatin 20 MG tablet Commonly known as: ZOCOR Take 20 mg by mouth every morning.        Allergies:  Allergies  Allergen Reactions   Ace Inhibitors Swelling   Contrast Media [Iodinated Contrast Media]    Cortisone     Patient had facial swelling and itching from cortisone injection IM    Family History: Family History  Problem Relation Age of Onset   Alcohol abuse Mother    Heart attack Father    Breast cancer Neg Hx     Social History:   reports that she quit smoking about 33 years ago. Her smoking use included cigarettes. She has a 35.00 pack-year smoking history. She has never used smokeless tobacco. She reports current alcohol use. She reports that she does not use drugs.  Physical Exam: BP (!) 151/84   Pulse 64   Ht _0  (1.6 m)   Wt 134 lb (60.8 kg)   BMI 23.74 kg/m   Constitutional:  Alert and oriented, no acute distress, nontoxic appearing HEENT: [place], AT Cardiovascular: No clubbing, cyanosis, or edema Respiratory: Normal respiratory effort, no increased work of breathing Skin: No rashes, bruises or suspicious lesions Neurologic: Grossly intact, no focal deficits, moving all 4 extremities Psychiatric: Normal mood and affect  Laboratory Data: Results for orders placed or performed in visit on 12/08/21  Microscopic Examination   Urine  Result Value Ref Range   WBC, UA >30 (A) 0 - 5 /hpf   RBC, Urine 11-30 (A) 0 - 2 /hpf   Epithelial Cells (non renal) 0-10 0 - 10 /hpf   Bacteria, UA Many (A) None seen/Few  Urinalysis, Complete  Result Value Ref Range   Specific Gravity, UA 1.025 1.005 - 1.030   pH, UA 5.5 5.0 - 7.5   Color, UA Yellow Yellow   Appearance Ur Cloudy (A) Clear   Leukocytes,UA 2+ (A)  Negative   Protein,UA 2+ (A) Negative/Trace   Glucose, UA Negative Negative   Ketones, UA Negative Negative   RBC, UA 2+ (A) Negative   Bilirubin, UA Negative Negative   Urobilinogen, Ur 0.2 0.2 - 1.0 mg/dL   Nitrite, UA Positive (A) Negative   Microscopic Examination See below:    Pertinent Imaging: KUB, 12/08/2021: CLINICAL DATA:  kidney stone   EXAM: ABDOMEN - 1 VIEW   COMPARISON:  09/17/2021   FINDINGS: Normal bowel gas pattern. Stable left double-J ureteral stent. No convincing urolithiasis. Stable pelvic phleboliths. Spondylitic changes in the lower lumbar spine.   IMPRESSION: Stable left double-J ureteral stent. No convincing urolithiasis.     Electronically  Signed   By: Lucrezia Europe M.D.   On: 12/09/2021 19:55  I personally reviewed the images referenced above and note left ureteral stent in appropriate place with no apparent encrustation.  Assessment & Plan:   1. Gross hematuria UA today is stable and consistent with her known history of urinary colonization.  We discussed that her gross hematuria is more likely associated with her stent in the setting of increased physical activity, though I will send her urine for culture and contact her with results.  If she is increasingly symptomatic at that time, okay to proceed with antibiotic therapy.  She is in agreement with this plan. - Urinalysis, Complete - CULTURE, URINE COMPREHENSIVE  2. Ureteropelvic junction (UPJ) obstruction, left No evidence of stent encrustation on KUB today.  We will have her follow-up with Dr. Bernardo Heater as scheduled.  Return for Will call with results.  Debroah Loop, PA-C  [facility] [address] [place], [place] [address] [phone]

## 2021-12-11 LAB — CULTURE, URINE COMPREHENSIVE

## 2021-12-13 ENCOUNTER — Other Ambulatory Visit: Payer: Self-pay | Admitting: Urology

## 2021-12-13 DIAGNOSIS — N135 Crossing vessel and stricture of ureter without hydronephrosis: Secondary | ICD-10-CM

## 2021-12-13 NOTE — Progress Notes (Signed)
Surgical Physician Order Form [facility]  * Scheduling expectation :  February 2024  *Length of Case: 30 minutes  *Clearance needed: no  *Anticoagulation Instructions: N/A  *Aspirin Instructions: N/A  *Post-op visit Date/Instructions:  6 month follow up  *Diagnosis:  Left UPJ obstruction  *Procedure: Left  Cysto w/stent exchange (47096); left retrograde pyelogram   Additional orders: N/A  -Admit type: OUTpatient  -Anesthesia: Choice  -VTE Prophylaxis Standing Order SCD's       Other:   -Standing Lab Orders Per Anesthesia    Lab other: UA&Urine Culture  -Standing Test orders EKG/Chest x-ray per Anesthesia       Test other:   - Medications:  Ceftriaxone(Rocephin) 1gm IV  -Other orders:  N/A

## 2021-12-14 ENCOUNTER — Telehealth: Payer: Self-pay

## 2021-12-14 NOTE — Telephone Encounter (Signed)
Left Message for patient to schedule surgery. No answer. Did leave a detailed message asking for patient to return my call.

## 2021-12-24 ENCOUNTER — Telehealth: Payer: Self-pay

## 2021-12-24 NOTE — Progress Notes (Signed)
   [facility]  Surgery Date: Date: 03/02/2022  Surgeon: Dr. John Giovanni, MD  Inpt ( No  )   Outpt (Yes)   Obs ( No  )   Diagnosis: N13.5 Left Ureteropelvic Junction Obstruction  -CPT: 88828, [id number]  Surgery: Left Cystoscopy with Stent Exchange and Left Retrograde Pyelogram  Stop Anticoagulations: N/A  Cardiac/Medical/Pulmonary Clearance needed: no  *Orders entered into EPIC  Date: 12/24/21   *Case booked in EPIC  Date: 12/21/2021  *Notified pt of Surgery: Date: 12/21/2021  PRE-OP UA & CX: yes, will obtain in clinic on 02/18/2022  *Placed into Prior Authorization Work Fabio Bering Date: 12/24/21  Assistant/laser/rep:No

## 2021-12-24 NOTE — Telephone Encounter (Signed)
I spoke with Mrs. Heather [name]. We have discussed possible surgery dates and Tuesday February 6th, 2024 was agreed upon by all parties. Patient given information about surgery date, what to expect pre-operatively and post operatively.  We discussed that a Pre-Admission Testing office will be calling to set up the pre-op visit that will take place prior to surgery, and that these appointments are typically done over the phone with a Pre-Admissions RN.  Informed patient that our office will communicate any additional care to be provided after surgery. Patients questions or concerns were discussed during our call. Advised to call our office should there be any additional information, questions or concerns that arise. Patient verbalized understanding.

## 2022-02-18 ENCOUNTER — Other Ambulatory Visit: Payer: Medicare HMO

## 2022-02-18 DIAGNOSIS — N135 Crossing vessel and stricture of ureter without hydronephrosis: Secondary | ICD-10-CM

## 2022-02-18 LAB — MICROSCOPIC EXAMINATION: WBC, UA: >30 /hpf — AB (ref 0–5)

## 2022-02-18 LAB — URINALYSIS, COMPLETE
Bilirubin, UA: NEGATIVE
Glucose, UA: NEGATIVE
Ketones, UA: NEGATIVE
Nitrite, UA: NEGATIVE
Specific Gravity, UA: 1.015 (ref 1.005–1.030)
Urobilinogen, Ur: 0.2 mg/dL (ref 0.2–1.0)
pH, UA: 6.5 (ref 5.0–7.5)

## 2022-02-20 LAB — CULTURE, URINE COMPREHENSIVE

## 2022-02-22 ENCOUNTER — Other Ambulatory Visit: Payer: Self-pay

## 2022-02-22 ENCOUNTER — Encounter
Admission: RE | Admit: 2022-02-22 | Discharge: 2022-02-22 | Disposition: A | Discharge status: Home / Self Care | Payer: Medicare HMO | Source: Ambulatory Visit | Attending: Urology | Admitting: Urology

## 2022-02-22 DIAGNOSIS — Z01812 Encounter for preprocedural laboratory examination: Secondary | ICD-10-CM

## 2022-02-22 HISTORY — DX: Angina pectoris, unspecified: I20.9

## 2022-02-22 NOTE — Patient Instructions (Addendum)
Your procedure is scheduled on: 03/02/22 - Tuesday Report to the Registration Desk on the 1st floor of the [facility]. To find out your arrival time, please call [phone] between 1PM - 3PM on: 03/01/22 - Monday If your arrival time is 6:00 am, do not arrive prior to that time as the [place] entrance doors do not open until 6:00 am.  REMEMBER: Instructions that are not followed completely may result in serious medical risk, up to and including death; or upon the discretion of your surgeon and anesthesiologist your surgery may need to be rescheduled.  Do not eat food or drink any liquids after midnight the night before surgery.  No gum chewing, lozengers or hard candies.   TAKE THESE MEDICATIONS THE MORNING OF SURGERY WITH A SIP OF WATER: NONE  One week prior to surgery: Stop Anti-inflammatories (NSAIDS) such as Advil, Aleve, Ibuprofen, Motrin, Naproxen, Naprosyn and Aspirin based products such as Excedrin, Goodys Powder, BC Powder.  Stop ANY OVER THE COUNTER supplements until after surgery.  You may however, continue to take Tylenol if needed for pain up until the day of surgery.  No Alcohol for 24 hours before or after surgery.  No Smoking including e-cigarettes for 24 hours prior to surgery.  No chewable tobacco products for at least 6 hours prior to surgery.  No nicotine patches on the day of surgery.  Do not use any "recreational" drugs for at least a week prior to your surgery.  Please be advised that the combination of cocaine and anesthesia may have negative outcomes, up to and including death. If you test positive for cocaine, your surgery will be cancelled.  On the morning of surgery brush your teeth with toothpaste and water, you may rinse your mouth with mouthwash if you wish. Do not swallow any toothpaste or mouthwash.  Do not wear jewelry, make-up, hairpins, clips or nail polish.  Do not wear lotions, powders, or perfumes.   Do not shave body from the  neck down 48 hours prior to surgery just in case you cut yourself which could leave a site for infection.  Also, freshly shaved skin may become irritated if using the CHG soap.  Contact lenses, hearing aids and dentures may not be worn into surgery.  Do not bring valuables to the hospital. [facility] is not responsible for any missing/lost belongings or valuables.   Notify your doctor if there is any change in your medical condition (cold, fever, infection).  Wear comfortable clothing (specific to your surgery type) to the hospital.  After surgery, you can help prevent lung complications by doing breathing exercises.  Take deep breaths and cough every 1-2 hours. Your doctor may order a device called an Incentive Spirometer to help you take deep breaths. When coughing or sneezing, hold a pillow firmly against your incision with both hands. This is called "splinting." Doing this helps protect your incision. It also decreases belly discomfort.  If you are being admitted to the hospital overnight, leave your suitcase in the car. After surgery it may be brought to your room.  If you are being discharged the day of surgery, you will not be allowed to drive home. You will need a responsible adult (18 years or older) to drive you home and stay with you that night.   If you are taking public transportation, you will need to have a responsible adult (18 years or older) with you. Please confirm with your physician that it is acceptable to use public  transportation.   Please call the [facility] Dept. at [phone] if you have any questions about these instructions.  Surgery Visitation Policy:  Patients undergoing a surgery or procedure may have two family members or support persons with them as long as the person is not COVID-19 positive or experiencing its symptoms.   Inpatient Visitation:    Visiting hours are 7 a.m. to 8 p.m. Up to four visitors are allowed at one time in a  patient room. The visitors may rotate out with other people during the day. One designated support person (adult) may remain overnight.  Due to an increase in RSV and influenza rates and associated hospitalizations, children ages 52 and under will not be able to visit patients in [facility]. Masks continue to be strongly recommended.

## 2022-02-25 ENCOUNTER — Encounter: Payer: Self-pay | Admitting: Urgent Care

## 2022-02-25 ENCOUNTER — Encounter
Admission: RE | Admit: 2022-02-25 | Discharge: 2022-02-25 | Disposition: A | Discharge status: Home / Self Care | Payer: Medicare HMO | Source: Ambulatory Visit | Attending: Urology | Admitting: Urology

## 2022-02-25 DIAGNOSIS — Z01812 Encounter for preprocedural laboratory examination: Secondary | ICD-10-CM

## 2022-02-25 DIAGNOSIS — R0789 Other chest pain: Secondary | ICD-10-CM | POA: Diagnosis not present

## 2022-02-25 LAB — CBC
HCT: 43.8 % (ref 36.0–46.0)
Hemoglobin: 14.4 g/dL (ref 12.0–15.0)
MCH: 33 pg (ref 26.0–34.0)
MCHC: 32.9 g/dL (ref 30.0–36.0)
MCV: 100.2 fL — ABNORMAL HIGH (ref 80.0–100.0)
Platelets: 203 10*3/uL (ref 150–400)
RBC: 4.37 MIL/uL (ref 3.87–5.11)
RDW: 13.4 % (ref 11.5–15.5)
WBC: 6 10*3/uL (ref 4.0–10.5)
nRBC: 0 % (ref 0.0–0.2)

## 2022-02-25 LAB — BASIC METABOLIC PANEL
Anion gap: 7 (ref 5–15)
BUN: 29 mg/dL — ABNORMAL HIGH (ref 8–23)
CO2: 27 mmol/L (ref 22–32)
Calcium: 9.2 mg/dL (ref 8.9–10.3)
Chloride: 106 mmol/L (ref 98–111)
Creatinine, Ser: 0.81 mg/dL (ref 0.44–1.00)
GFR, Estimated: >60 mL/min (ref >60)
Glucose, Bld: 88 mg/dL (ref 70–99)
Potassium: 3.9 mmol/L (ref 3.5–5.1)
Sodium: 140 mmol/L (ref 135–145)

## 2022-03-02 ENCOUNTER — Encounter
Admission: RE | Disposition: A | Discharge status: Home / Self Care | Payer: Self-pay | Source: Home / Self Care | Attending: Urology

## 2022-03-02 ENCOUNTER — Ambulatory Visit: Payer: Medicare HMO | Admitting: Urgent Care

## 2022-03-02 ENCOUNTER — Encounter: Payer: Self-pay | Admitting: Urology

## 2022-03-02 ENCOUNTER — Ambulatory Visit: Payer: Medicare HMO

## 2022-03-02 ENCOUNTER — Ambulatory Visit
Admission: RE | Admit: 2022-03-02 | Discharge: 2022-03-02 | Disposition: A | Discharge status: Home / Self Care | Payer: Medicare HMO | Attending: Urology | Admitting: Urology

## 2022-03-02 ENCOUNTER — Other Ambulatory Visit: Payer: Self-pay

## 2022-03-02 DIAGNOSIS — Z79899 Other long term (current) drug therapy: Secondary | ICD-10-CM | POA: Diagnosis not present

## 2022-03-02 DIAGNOSIS — N135 Crossing vessel and stricture of ureter without hydronephrosis: Secondary | ICD-10-CM | POA: Diagnosis not present

## 2022-03-02 DIAGNOSIS — J449 Chronic obstructive pulmonary disease, unspecified: Secondary | ICD-10-CM | POA: Diagnosis not present

## 2022-03-02 DIAGNOSIS — K449 Diaphragmatic hernia without obstruction or gangrene: Secondary | ICD-10-CM | POA: Diagnosis not present

## 2022-03-02 DIAGNOSIS — K219 Gastro-esophageal reflux disease without esophagitis: Secondary | ICD-10-CM | POA: Insufficient documentation

## 2022-03-02 DIAGNOSIS — Z87891 Personal history of nicotine dependence: Secondary | ICD-10-CM | POA: Insufficient documentation

## 2022-03-02 DIAGNOSIS — I10 Essential (primary) hypertension: Secondary | ICD-10-CM | POA: Insufficient documentation

## 2022-03-02 HISTORY — PX: CYSTOSCOPY W/ URETERAL STENT PLACEMENT: SHX1429

## 2022-03-02 HISTORY — PX: CYSTOSCOPY W/ RETROGRADES: SHX1426

## 2022-03-02 SURGERY — CYSTOSCOPY, FLEXIBLE, WITH STENT REPLACEMENT
Anesthesia: General | Site: Ureter | Laterality: Left

## 2022-03-02 MED ORDER — FENTANYL CITRATE (PF) 100 MCG/2ML IJ SOLN
INTRAMUSCULAR | Status: AC
  Filled 2022-03-02: qty 2

## 2022-03-02 MED ORDER — ONDANSETRON HCL 4 MG/2ML IJ SOLN: 4.0000 mg | Freq: Once | INTRAMUSCULAR | Status: DC | PRN

## 2022-03-02 MED ORDER — CHLORHEXIDINE GLUCONATE 0.12 % MT SOLN
OROMUCOSAL | Status: AC
  Administered 2022-03-02: 15 mL via OROMUCOSAL
  Filled 2022-03-02: qty 15

## 2022-03-02 MED ORDER — PROPOFOL 10 MG/ML IV BOLUS
INTRAVENOUS | Status: DC | PRN
  Administered 2022-03-02: 120 mg via INTRAVENOUS

## 2022-03-02 MED ORDER — SODIUM CHLORIDE 0.9 % IV SOLN
1.0000 g | INTRAVENOUS | Status: AC
  Administered 2022-03-02: 1 g via INTRAVENOUS
  Filled 2022-03-02: qty 10

## 2022-03-02 MED ORDER — EPHEDRINE SULFATE (PRESSORS) 50 MG/ML IJ SOLN
INTRAMUSCULAR | Status: DC | PRN
  Administered 2022-03-02 (×2): 5 mg via INTRAVENOUS

## 2022-03-02 MED ORDER — CHLORHEXIDINE GLUCONATE 0.12 % MT SOLN: 15.0000 mL | Freq: Once | OROMUCOSAL | Status: AC

## 2022-03-02 MED ORDER — FENTANYL CITRATE (PF) 100 MCG/2ML IJ SOLN
INTRAMUSCULAR | Status: DC | PRN
  Administered 2022-03-02: 25 ug via INTRAVENOUS
  Administered 2022-03-02: 50 ug via INTRAVENOUS
  Administered 2022-03-02: 25 ug via INTRAVENOUS

## 2022-03-02 MED ORDER — SODIUM CHLORIDE 0.9 % IR SOLN
Status: DC | PRN
  Administered 2022-03-02: 3000 mL via INTRAVESICAL

## 2022-03-02 MED ORDER — LIDOCAINE HCL (CARDIAC) PF 100 MG/5ML IV SOSY
PREFILLED_SYRINGE | INTRAVENOUS | Status: DC | PRN
  Administered 2022-03-02: 60 mg via INTRAVENOUS

## 2022-03-02 MED ORDER — IOHEXOL 180 MG/ML  SOLN
INTRAMUSCULAR | Status: DC | PRN
  Administered 2022-03-02: 10 mL

## 2022-03-02 MED ORDER — LACTATED RINGERS IV SOLN: INTRAVENOUS | Status: DC

## 2022-03-02 MED ORDER — PHENYLEPHRINE HCL-NACL 20-0.9 MG/250ML-% IV SOLN
INTRAVENOUS | Status: AC
  Filled 2022-03-02: qty 250

## 2022-03-02 MED ORDER — FAMOTIDINE 20 MG PO TABS
ORAL_TABLET | ORAL | Status: AC
  Administered 2022-03-02: 20 mg via ORAL
  Filled 2022-03-02: qty 1

## 2022-03-02 MED ORDER — ORAL CARE MOUTH RINSE: 15.0000 mL | Freq: Once | OROMUCOSAL | Status: AC

## 2022-03-02 MED ORDER — ONDANSETRON HCL 4 MG/2ML IJ SOLN
INTRAMUSCULAR | Status: DC | PRN
  Administered 2022-03-02: 4 mg via INTRAVENOUS

## 2022-03-02 MED ORDER — FENTANYL CITRATE (PF) 100 MCG/2ML IJ SOLN: 25.0000 ug | INTRAMUSCULAR | Status: DC | PRN

## 2022-03-02 MED ORDER — FAMOTIDINE 20 MG PO TABS: 20.0000 mg | ORAL_TABLET | Freq: Once | ORAL | Status: AC

## 2022-03-02 SURGICAL SUPPLY — 24 items
BAG DRAIN SIEMENS DORNER NS (MISCELLANEOUS) ×1 IMPLANT
BAG DRN NS LF (MISCELLANEOUS) ×1
BRUSH SCRUB EZ  4% CHG (MISCELLANEOUS) ×1
BRUSH SCRUB EZ 1% IODOPHOR (MISCELLANEOUS) ×1 IMPLANT
BRUSH SCRUB EZ 4% CHG (MISCELLANEOUS) ×1 IMPLANT
CATH URETL OPEN 5X70 (CATHETERS) IMPLANT
CATH URETL OPEN END 6X70 (CATHETERS) ×1 IMPLANT
DRAPE UTILITY 15X26 TOWEL STRL (DRAPES) ×1 IMPLANT
GAUZE 4X4 16PLY ~~LOC~~+RFID DBL (SPONGE) ×2 IMPLANT
GLOVE SURG UNDER POLY LF SZ7.5 (GLOVE) ×1 IMPLANT
GOWN STRL REUS W/ TWL LRG LVL3 (GOWN DISPOSABLE) ×1 IMPLANT
GOWN STRL REUS W/ TWL XL LVL3 (GOWN DISPOSABLE) ×1 IMPLANT
GOWN STRL REUS W/TWL LRG LVL3 (GOWN DISPOSABLE) ×1
GOWN STRL REUS W/TWL XL LVL3 (GOWN DISPOSABLE) ×1
GUIDEWIRE STR DUAL SENSOR (WIRE) ×1 IMPLANT
IV NS IRRIG 3000ML ARTHROMATIC (IV SOLUTION) ×1 IMPLANT
KIT TURNOVER CYSTO (KITS) ×1 IMPLANT
PACK CYSTO AR (MISCELLANEOUS) ×1 IMPLANT
SET CYSTO W/LG BORE CLAMP LF (SET/KITS/TRAYS/PACK) ×1 IMPLANT
STENT URET 6FRX24 CONTOUR (STENTS) IMPLANT
STENT URET 6FRX26 CONTOUR (STENTS) IMPLANT
STENT URO INLAY 6FRX22CM (STENTS) IMPLANT
SURGILUBE 2OZ TUBE FLIPTOP (MISCELLANEOUS) ×1 IMPLANT
WATER STERILE IRR 500ML POUR (IV SOLUTION) ×1 IMPLANT

## 2022-03-02 NOTE — Anesthesia Procedure Notes (Signed)
Procedure Name: LMA Insertion Date/Time: 03/02/2022 8:19 AM  Performed by: Lerry Liner, CRNAPatient Re-evaluated:Patient Re-evaluated prior to induction Oxygen Delivery Method: Circle system utilized Induction Type: IV induction LMA: LMA inserted LMA Size: 3.0 Dental Injury: Teeth and Oropharynx as per pre-operative assessment

## 2022-03-02 NOTE — H&P (Signed)
Urology H&P  Urologic history: 1.  Left UPJ obstruction Initially asymptomatic and elected observation Admitted 11/2020 with pyelonephritis and underwent left ureteral stent placement with findings of pyonephrosis Elected chronic stent drainage   HPI: 87 y.o. female who presents for stent exchange  Stent last exchanged 03/03/2021 ([facility]) Follow-up KUB August 2023 showed no evidence of stent encrustation No complaints No bothersome LUTS   Past Medical History:  Diagnosis Date   Anginal pain ([place])    Brain aneurysm    Cancer ([place]) lung   2016   Carotid artery stenosis    Carotid stenosis 06/19/2013   Colonic polyp    Emphysema lung ([facility])    GERD (gastroesophageal reflux disease)    Hiatal hernia    Hypercholesteremia    Hyperlipidemia, mixed 06/09/2015   Hypertension    Lung nodule 06/21/2014   Macrocytic 10/06/2014   PAT (paroxysmal atrial tachycardia) 06/19/2013   Plantar fasciitis     Past Surgical History:  Procedure Laterality Date   BREAST EXCISIONAL BIOPSY Right 40 yrs ago   neg   BREAST LUMPECTOMY     benign   CATARACT EXTRACTION W/ INTRAOCULAR LENS  IMPLANT, BILATERAL Bilateral    CEREBRAL ANEURYSM REPAIR  2004   CERVIX LESION DESTRUCTION     COLONOSCOPY WITH PROPOFOL N/A 03/31/2015   Procedure: COLONOSCOPY WITH PROPOFOL;  Surgeon: Manya Silvas, MD;  Location: [facility];  Service: Endoscopy;  Laterality: N/A;   CYSTOSCOPY W/ URETERAL STENT PLACEMENT Left 03/03/2021   Procedure: CYSTOSCOPY WITH EXCHANGE;  Surgeon: Abbie Sons, MD;  Location: [facility];  Service: Urology;  Laterality: Left;   CYSTOSCOPY WITH STENT PLACEMENT Left 11/25/2020   Procedure: CYSTOSCOPY WITH STENT PLACEMENT;  Surgeon: Abbie Sons, MD;  Location: [facility];  Service: Urology;  Laterality: Left;   FLEXIBLE BRONCHOSCOPY N/A 03/10/2017   Procedure: FLEXIBLE BRONCHOSCOPY;  Surgeon: Nestor Lewandowsky, MD;  Location: [facility];  Service: Thoracic;  Laterality: N/A;    THORACOTOMY/LOBECTOMY Left 03/10/2017   Procedure: THORACOTOMY WITH LUNG WEDGE RESECTION POSSIBLE LOBECTOMY;  Surgeon: Nestor Lewandowsky, MD;  Location: [facility];  Service: Thoracic;  Laterality: Left;   TUBAL LIGATION      Home Medications:  Current Meds  Medication Sig   amLODipine (NORVASC) 5 MG tablet Take 5 mg by mouth at bedtime.   atenolol (TENORMIN) 50 MG tablet Take 50 mg by mouth at bedtime.   calcium-vitamin D (OSCAL WITH D) 500-200 MG-UNIT tablet Take 1 tablet by mouth as needed.   simvastatin (ZOCOR) 20 MG tablet Take 20 mg by mouth every morning.   vitamin B-12 (CYANOCOBALAMIN) 100 MCG tablet Take 100 mcg by mouth daily.    Allergies:  Allergies  Allergen Reactions   Ace Inhibitors Swelling   Contrast Media [Iodinated Contrast Media]    Cortisone     Patient had facial swelling and itching from cortisone injection IM    Family History  Problem Relation Age of Onset   Alcohol abuse Mother    Heart attack Father    Breast cancer Neg Hx     Social History:  reports that she quit smoking about 33 years ago. Her smoking use included cigarettes. She has a 35.00 pack-year smoking history. She has never used smokeless tobacco. She reports current alcohol use. She reports that she does not use drugs.  ROS: A complete review of systems was performed.  All systems are negative except for pertinent findings as noted.  Physical Exam:  Vital signs in last  24 hours: Temp:  [98.6 F (37 C)] 98.6 F (37 C) (02/06 0712) Pulse Rate:  [65] 65 (02/06 0712) Resp:  [16] 16 (02/06 0712) BP: (207)/(82) 207/82 (02/06 0712) SpO2:  [95 %] 95 % (02/06 0712) Weight:  [59 kg] 59 kg (02/06 0712) Constitutional:  Alert, No acute distress HEENT: [place] AT Cardiovascular: Regular rate and rhythm Respiratory: Normal respiratory effort, lungs clear bilaterally  Laboratory Data:  No results for input(s): "WBC", "HGB", "HCT" in the last 72 hours. No results for input(s): "NA", "K", "CL", "CO2",  "GLUCOSE", "BUN", "CREATININE", "CALCIUM" in the last 72 hours. No results for input(s): "LABPT", "INR" in the last 72 hours. No results for input(s): "LABURIN" in the last 72 hours. Results for orders placed or performed in visit on 02/18/22  CULTURE, URINE COMPREHENSIVE     Status: None   Collection Time: 02/18/22  9:52 AM   Specimen: Urine   UR  Result Value Ref Range Status   Urine Culture, Comprehensive Final report  Final   Organism ID, Bacteria Comment  Final    Comment: Mixed urogenital flora 10,000-25,000 colony forming units per mL   Microscopic Examination     Status: Abnormal   Collection Time: 02/18/22 11:05 AM   Urine  Result Value Ref Range Status   WBC, UA >30 (A) 0 - 5 /hpf Final   RBC, Urine 11-30 (A) 0 - 2 /hpf Final   Epithelial Cells (non renal) 0-10 0 - 10 /hpf Final   Bacteria, UA Many (A) None seen/Few Final     Radiologic Imaging: DG OR UROLOGY CYSTO IMAGE ([facility] ONLY)  Result Date: 03/02/2022 There is no interpretation for this exam.  This order is for images obtained during a surgical procedure.  Please See "Surgeries" Tab for more information regarding the procedure.    Impression Left UPJ obstruction  Plan:  Cystoscopy with left ureteral stent exchange   03/02/2022, 7:21 AM  John Giovanni,  MD

## 2022-03-02 NOTE — Anesthesia Preprocedure Evaluation (Signed)
Anesthesia Evaluation  Patient identified by MRN, date of birth, ID band Patient awake    Reviewed: Allergy & Precautions, H&P , NPO status , Patient's Chart, lab work & pertinent test results, reviewed documented beta blocker date and time   Airway Mallampati: II  TM Distance: >3 FB Neck ROM: full    Dental  (+) Teeth Intact   Pulmonary COPD, former smoker   Pulmonary exam normal        Cardiovascular Exercise Tolerance: Poor hypertension, On Medications negative cardio ROS Normal cardiovascular exam Rate:Normal     Neuro/Psych  Neuromuscular disease  negative psych ROS   GI/Hepatic Neg liver ROS, hiatal hernia,GERD  Medicated,,  Endo/Other  negative endocrine ROS    Renal/GU Renal disease  negative genitourinary   Musculoskeletal   Abdominal   Peds  Hematology negative hematology ROS (+)   Anesthesia Other Findings   Reproductive/Obstetrics negative OB ROS                             Anesthesia Physical Anesthesia Plan  ASA: 3  Anesthesia Plan: General LMA   Post-op Pain Management:    Induction:   PONV Risk Score and Plan: 4 or greater  Airway Management Planned:   Additional Equipment:   Intra-op Plan:   Post-operative Plan:   Informed Consent: I have reviewed the patients History and Physical, chart, labs and discussed the procedure including the risks, benefits and alternatives for the proposed anesthesia with the patient or authorized representative who has indicated his/her understanding and acceptance.       Plan Discussed with: CRNA  Anesthesia Plan Comments:        Anesthesia Quick Evaluation

## 2022-03-02 NOTE — Discharge Instructions (Addendum)
DISCHARGE INSTRUCTIONS FOR KIDNEY STONE/URETERAL STENT   MEDICATIONS:  1. Resume all your other meds from home.  2.  AZO (over-the-counter) can help with the burning/stinging when you urinate.   ACTIVITY:  1. May resume regular activities in 24 hours.   SIGNS/SYMPTOMS TO CALL:  Common postoperative symptoms include urinary frequency, urgency, bladder spasm and blood in the urine  Please call us if you have a fever greater than 101.5, uncontrolled nausea/vomiting, uncontrolled pain, dizziness, unable to urinate, excessively bloody urine, chest pain, shortness of breath, leg swelling, leg pain, or any other concerns or questions.   You can reach [place] at [phone].   FOLLOW-UP:  1. You will be contacted by our office for a follow-up appointment  [place]   The drugs that you were given will stay in your system until tomorrow so for the next 24 hours you should not:  Drive an automobile Make any legal decisions Drink any alcoholic beverage   You may resume regular meals tomorrow.  Today it is better to start with liquids and gradually work up to solid foods.  You may eat anything you prefer, but it is better to start with liquids, then soup and crackers, and gradually work up to solid foods.   Please notify your doctor immediately if you have any unusual bleeding, trouble breathing, redness and pain at the surgery site, drainage, fever, or pain not relieved by medication.    Additional Instructions:    Please contact your physician with any problems or Same Day Surgery at [phone], Monday through Friday 6 am to 4 pm, or [place] at [facility] number at [phone].

## 2022-03-02 NOTE — Interval H&P Note (Signed)
History and Physical Interval Note:  03/02/2022 8:07 AM  Heather [name]  has presented today for surgery, with the diagnosis of Left Ureteropelvic Junction Obstruction.  The various methods of treatment have been discussed with the patient and family. After consideration of risks, benefits and other options for treatment, the patient has consented to  Procedure(s): CYSTOSCOPY WITH STENT EXCHANGE (Left) CYSTOSCOPY WITH RETROGRADE PYELOGRAM (Left) as a surgical intervention.  The patient's history has been reviewed, patient examined, no change in status, stable for surgery.  I have reviewed the patient's chart and labs.  Questions were answered to the patient's satisfaction.     [name]

## 2022-03-02 NOTE — Op Note (Signed)
Preoperative diagnosis:  Left UPJ obstruction  Postoperative diagnosis:  Same  Procedure: Cystoscopy with left ureteral stent exchange Left retrograde pyelogram  Surgeon: Abbie Sons, MD  Anesthesia: General  Complications: None  Intraoperative findings:  Left retrograde pyelogram-ureter normal in caliber with narrowing at the UPJ.  Marked dilation left renal pelvis  EBL: Minimal  Specimens: None  Indication: Heather [name] is a 87 y.o. female with chronic left UPJ obstruction and episode of pyelonephritis/pyonephrosis with sepsis November 2022.  She has elected chronic indwelling ureteral stent with periodic stent exchange.  Her stent was last exchanged February 2023.  Monitoring has shown no significant encrustation.  After reviewing the management options for treatment, he elected to proceed with the above surgical procedure(s). We have discussed the potential benefits and risks of the procedure, side effects of the proposed treatment, the likelihood of the patient achieving the goals of the procedure, and any potential problems that might occur during the procedure or recuperation. Informed consent has been obtained.  Description of procedure:  The patient was taken to the operating room and general anesthesia was induced.  The patient was placed in the dorsal lithotomy position, prepped and draped in the usual sterile fashion, and preoperative antibiotics were administered. A preoperative time-out was performed.   A 21 French cystoscope was lubricated and passed per urethra.  Panendoscopy was performed and no solid or papillary lesions were identified.  Mild inflammatory changes of the left hemitrigone secondary to the indwelling stent were noted.  The distal stent and showed mild encrustation.  The stent was grasped with endoscopic forceps and brought out through the urethral meatus.  A 0.038 Sensor wire was placed through the stent and advanced into the renal pelvis under  fluoroscopic guidance without difficulty.  The stent was removed and a 5 [place] open-ended ureteral catheter was placed over the guidewire to the region of the proximal ureter.  Retrograde pyelogram was then performed with findings as described above.  The guidewire was replaced and the ureteral catheter was removed.  A Bard Optima 28F/22 cm double-J ureteral stent was then advanced over the wire under fluoroscopic guidance.  A good curl was seen in the renal pelvis and bladder on fluoroscopy.  After anesthetic reversal she was transported to the PACU in stable condition.  Plan: Office follow-up 6 months with KUB Tentative stent exchange 1 year   Abbie Sons, M.D.

## 2022-03-02 NOTE — Transfer of Care (Signed)
Immediate Anesthesia Transfer of Care Note  Patient: Heather [name]  Procedure(s) Performed: CYSTOSCOPY WITH STENT EXCHANGE (Left: Ureter) CYSTOSCOPY WITH RETROGRADE PYELOGRAM (Left: Ureter)  Patient Location: PACU  Anesthesia Type:General  Level of Consciousness: drowsy  Airway & Oxygen Therapy: Patient Spontanous Breathing and Patient connected to face mask oxygen  Post-op Assessment: Report given to RN, Post -op Vital signs reviewed and stable, and Patient moving all extremities  Post vital signs: Reviewed and stable  Last Vitals:  Vitals Value Taken Time  BP 143/53 03/02/22 0847  Temp    Pulse 68 03/02/22 0850  Resp 14 03/02/22 0850  SpO2 100 % 03/02/22 0850  Vitals shown include unvalidated device data.  Last Pain:  Vitals:   03/02/22 0712  TempSrc: Temporal  PainSc: 0-No pain         Complications: No notable events documented.

## 2022-03-03 ENCOUNTER — Encounter: Payer: Self-pay | Admitting: Urology

## 2022-03-08 NOTE — Anesthesia Postprocedure Evaluation (Signed)
Anesthesia Post Note  Patient: Heather [name]  Procedure(s) Performed: CYSTOSCOPY WITH STENT EXCHANGE (Left: Ureter) CYSTOSCOPY WITH RETROGRADE PYELOGRAM (Left: Ureter)  Patient location during evaluation: PACU Anesthesia Type: General Level of consciousness: awake and alert Pain management: pain level controlled Vital Signs Assessment: post-procedure vital signs reviewed and stable Respiratory status: spontaneous breathing, nonlabored ventilation, respiratory function stable and patient connected to nasal cannula oxygen Cardiovascular status: blood pressure returned to baseline and stable Postop Assessment: no apparent nausea or vomiting Anesthetic complications: no   No notable events documented.   Last Vitals:  Vitals:   03/02/22 0923 03/02/22 0932  BP: (!) 194/65 (!) 168/71  Pulse: 78   Resp: 16   Temp: 36.8 C   SpO2: 95%     Last Pain:  Vitals:   03/02/22 0923  TempSrc: Temporal  PainSc: 0-No pain                 Molli Barrows

## 2022-03-16 ENCOUNTER — Ambulatory Visit
Admission: RE | Admit: 2022-03-16 | Discharge: 2022-03-16 | Disposition: A | Discharge status: Home / Self Care | Payer: Medicare HMO | Source: Ambulatory Visit | Attending: Oncology | Admitting: Oncology

## 2022-03-16 DIAGNOSIS — C3411 Malignant neoplasm of upper lobe, right bronchus or lung: Secondary | ICD-10-CM | POA: Diagnosis not present

## 2022-03-18 ENCOUNTER — Inpatient Hospital Stay: Payer: Medicare HMO | Attending: Oncology

## 2022-03-18 ENCOUNTER — Encounter: Payer: Self-pay | Admitting: Oncology

## 2022-03-18 ENCOUNTER — Inpatient Hospital Stay (HOSPITAL_BASED_OUTPATIENT_CLINIC_OR_DEPARTMENT_OTHER): Payer: Medicare HMO | Admitting: Oncology

## 2022-03-18 ENCOUNTER — Other Ambulatory Visit: Payer: Self-pay

## 2022-03-18 VITALS — BP 175/65 | HR 62 | Temp 97.3°F | Wt 131.4 lb

## 2022-03-18 DIAGNOSIS — Z85118 Personal history of other malignant neoplasm of bronchus and lung: Secondary | ICD-10-CM | POA: Insufficient documentation

## 2022-03-18 DIAGNOSIS — C3411 Malignant neoplasm of upper lobe, right bronchus or lung: Secondary | ICD-10-CM | POA: Diagnosis not present

## 2022-03-18 DIAGNOSIS — C3432 Malignant neoplasm of lower lobe, left bronchus or lung: Secondary | ICD-10-CM

## 2022-03-18 DIAGNOSIS — R918 Other nonspecific abnormal finding of lung field: Secondary | ICD-10-CM | POA: Diagnosis not present

## 2022-03-18 DIAGNOSIS — I10 Essential (primary) hypertension: Secondary | ICD-10-CM | POA: Insufficient documentation

## 2022-03-18 DIAGNOSIS — Z87891 Personal history of nicotine dependence: Secondary | ICD-10-CM | POA: Insufficient documentation

## 2022-03-18 DIAGNOSIS — D751 Secondary polycythemia: Secondary | ICD-10-CM

## 2022-03-18 LAB — CBC WITH DIFFERENTIAL (CANCER CENTER ONLY)
Abs Immature Granulocytes: 0.01 10*3/uL (ref 0.00–0.07)
Basophils Absolute: 0 10*3/uL (ref 0.0–0.1)
Basophils Relative: 0 %
Eosinophils Absolute: 0.1 10*3/uL (ref 0.0–0.5)
Eosinophils Relative: 1 %
HCT: 45.5 % (ref 36.0–46.0)
Hemoglobin: 15.1 g/dL — ABNORMAL HIGH (ref 12.0–15.0)
Immature Granulocytes: 0 %
Lymphocytes Relative: 29 %
Lymphs Abs: 2 10*3/uL (ref 0.7–4.0)
MCH: 33.8 pg (ref 26.0–34.0)
MCHC: 33.2 g/dL (ref 30.0–36.0)
MCV: 101.8 fL — ABNORMAL HIGH (ref 80.0–100.0)
Monocytes Absolute: 0.5 10*3/uL (ref 0.1–1.0)
Monocytes Relative: 7 %
Neutro Abs: 4.2 10*3/uL (ref 1.7–7.7)
Neutrophils Relative %: 63 %
Platelet Count: 153 10*3/uL (ref 150–400)
RBC: 4.47 MIL/uL (ref 3.87–5.11)
RDW: 13.2 % (ref 11.5–15.5)
WBC Count: 6.8 10*3/uL (ref 4.0–10.5)
nRBC: 0 % (ref 0.0–0.2)

## 2022-03-18 LAB — CMP (CANCER CENTER ONLY)
ALT: 20 U/L (ref 0–44)
AST: 24 U/L (ref 15–41)
Albumin: 4.4 g/dL (ref 3.5–5.0)
Alkaline Phosphatase: 54 U/L (ref 38–126)
Anion gap: 8 (ref 5–15)
BUN: 23 mg/dL (ref 8–23)
CO2: 28 mmol/L (ref 22–32)
Calcium: 9.7 mg/dL (ref 8.9–10.3)
Chloride: 105 mmol/L (ref 98–111)
Creatinine: 0.97 mg/dL (ref 0.44–1.00)
GFR, Estimated: 58 mL/min — ABNORMAL LOW (ref >60)
Glucose, Bld: 100 mg/dL — ABNORMAL HIGH (ref 70–99)
Potassium: 4.4 mmol/L (ref 3.5–5.1)
Sodium: 141 mmol/L (ref 135–145)
Total Bilirubin: 0.8 mg/dL (ref 0.3–1.2)
Total Protein: 7.2 g/dL (ref 6.5–8.1)

## 2022-03-18 NOTE — Progress Notes (Signed)
Hematology/Oncology Progress note [phone] [phone]) [phone]     Chief Complaint:  Follow up for lung cancer   ASSESSMENT & PLAN:  Heather [name] is a 87 y.o. 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 Pathology revealed a 1.1 cm high-grade adenocarcinoma with pleural invasion. Nodes were negative. Pathologic stage was T2aN1M0 (stage IB).  She is s/p wedge resection of a left lower lobe mass on 03/10/2017.  Pathology revealed an 8 mm invasive adenocarcinoma with solid and acinar patterns.  There was no visceral invasion.  There was no lymphovascular invasion.  All margins were negative.  Pathologic stage was T1aNx (stage IA).   Malignant neoplasm of lower lobe of left lung (HCC) #History of stage Ib right lung upper lobe adenocarcinoma status post wedge resection in 2016 History of left lower lobe adenocarcinoma T1 a NX status post wedge resection 2019. Clinically patient is doing very well. She has been 5 years post her lung cancer surgery. 03/16/2022 CT chest without contrast showed no evidence of cancer recurrence, RLL ground-glass nodule  and LUL nodule, continue observation.  Continue annual CT scan surveillance. .  Primary cancer of right upper lobe of lung ([place]) Same plan as above.   Erythrocytosis JAK2 V617F mutation negative, with reflex to other mutations CALR, MPL, JAK 2 Ex 12-15 mutations negative. Negative BCR ABL. Most likely this is secondary erythrocytosis.  I suspect that this may be secondary to hypoxia due to underlying chronic lung diseases/emphysema or sleep apnea.  No need for phlebotomy given that hematocrit is less than 52.  Orders Placed This Encounter  Procedures   CT Chest Wo Contrast    Standing Status:   Future    Order Specific Question:   Preferred imaging location?    Answer:   [facility]   CBC with Differential ([place] Only)    Standing Status:   Future     Standing Expiration Date:   03/19/2023   CMP ([place] only)    Standing Status:   Future    Standing Expiration Date:   03/19/2023   RTC after CT scan in 12 months for MD assessment and labs (CBC with diff, CMP). All questions were answered. The patient knows to call the clinic with any problems, questions or concerns.  Earlie Server, MD, PhD [facility] Hematology Oncology 03/18/2022   PERTINENT ONCOLOGY HISTORY Previously follows up with Dr.Corcoran. [place] care with me on 03/15/2018  PET scan on 02/13/2014 revealed a 1 cm right upper lobe hypermetabolic nodule (SUV 3.8) and a 6 mm right upper lobe nodule (no metabolic activity). There was no mediastianl adenopathy.  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 Pathology revealed a 1.1 cm high-grade adenocarcinoma with pleural invasion. Nodes were negative. Pathologic stage was T2aN1M0 (stage IB).  Chest CT on 02/10/2017 revealed clear interval progression of the posterior left lower lobe pulmonary nodule, now measuring 12 mm in long axis and having a distinctly lobular contour. Imaging features were very concerning for neoplasm.  There was no change in the 7 mm nodule posterior to the right hilum.  PET scan on 02/18/2017 revealed a 11 x 8 mm posterior left lower lobe pulmonary nodule that had mildly progressed from prior studies and demonstrated mild hypermetabolism (SUV 1.6).  The appearance was considered worrisome for an indolent primary bronchogenic neoplasm.  03/10/2017.  s/p wedge resection of a left lower lobe mass.   Pathology  revealed an 8 mm invasive adenocarcinoma with solid and acinar patterns.  There was no visceral invasion.  There was no lymphovascular invasion.  All margins were negative.  Pathologic stage was T1aNx (stage IA).   INTERVAL HISTORY ALLIANA MCAULIFF is a 87 y.o. female who has above history reviewed by me today presents for follow up visit for management of history  of lung cancer, 6 months assessment.  Patient reports feeling well.  She was accompanied by her husband. Denies shortness of breath, fatigue, hemoptysis, unintentional weight loss. She denies any new complaints     Past Medical History:  Diagnosis Date   Anginal pain ([place])    Brain aneurysm    Cancer [facility]) lung   2016   Carotid artery stenosis    Carotid stenosis 06/19/2013   Colonic polyp    Emphysema lung ([place])    GERD (gastroesophageal reflux disease)    Hiatal hernia    Hypercholesteremia    Hyperlipidemia, mixed 06/09/2015   Hypertension    Lung nodule 06/21/2014   Macrocytic 10/06/2014   PAT (paroxysmal atrial tachycardia) 06/19/2013   Plantar fasciitis     Past Surgical History:  Procedure Laterality Date   BREAST EXCISIONAL BIOPSY Right 40 yrs ago   neg   BREAST LUMPECTOMY     benign   CATARACT EXTRACTION W/ INTRAOCULAR LENS  IMPLANT, BILATERAL Bilateral    CEREBRAL ANEURYSM REPAIR  2004   CERVIX LESION DESTRUCTION     COLONOSCOPY WITH PROPOFOL N/A 03/31/2015   Procedure: COLONOSCOPY WITH PROPOFOL;  Surgeon: Manya Silvas, MD;  Location: [place];  Service: Endoscopy;  Laterality: N/A;   CYSTOSCOPY W/ RETROGRADES Left 03/02/2022   Procedure: CYSTOSCOPY WITH RETROGRADE PYELOGRAM;  Surgeon: Abbie Sons, MD;  Location: [facility];  Service: Urology;  Laterality: Left;   CYSTOSCOPY W/ URETERAL STENT PLACEMENT Left 03/03/2021   Procedure: CYSTOSCOPY WITH EXCHANGE;  Surgeon: Abbie Sons, MD;  Location: [facility];  Service: Urology;  Laterality: Left;   CYSTOSCOPY W/ URETERAL STENT PLACEMENT Left 03/02/2022   Procedure: CYSTOSCOPY WITH STENT EXCHANGE;  Surgeon: Abbie Sons, MD;  Location: [facility];  Service: Urology;  Laterality: Left;   CYSTOSCOPY WITH STENT PLACEMENT Left 11/25/2020   Procedure: CYSTOSCOPY WITH STENT PLACEMENT;  Surgeon: Abbie Sons, MD;  Location: [facility];  Service: Urology;  Laterality: Left;   FLEXIBLE BRONCHOSCOPY N/A 03/10/2017    Procedure: FLEXIBLE BRONCHOSCOPY;  Surgeon: Nestor Lewandowsky, MD;  Location: [facility];  Service: Thoracic;  Laterality: N/A;   THORACOTOMY/LOBECTOMY Left 03/10/2017   Procedure: THORACOTOMY WITH LUNG WEDGE RESECTION POSSIBLE LOBECTOMY;  Surgeon: Nestor Lewandowsky, MD;  Location: [facility];  Service: Thoracic;  Laterality: Left;   TUBAL LIGATION      Family History  Problem Relation Age of Onset   Alcohol abuse Mother    Heart attack Father    Breast cancer Neg Hx     Social History:  reports that she quit smoking about 33 years ago. Her smoking use included cigarettes. She has a 35.00 pack-year smoking history. She has never used smokeless tobacco. She reports current alcohol use. She reports that she does not use drugs.   Allergies:  Allergies  Allergen Reactions   Ace Inhibitors Swelling   Contrast Media [Iodinated Contrast Media]    Cortisone     Patient had facial swelling and itching from cortisone injection IM    Current Medications: Current Outpatient Medications  Medication Sig Dispense Refill   amLODipine (NORVASC) 5 MG  tablet Take 5 mg by mouth at bedtime.     atenolol (TENORMIN) 50 MG tablet Take 50 mg by mouth at bedtime.     calcium-vitamin D (OSCAL WITH D) 500-200 MG-UNIT tablet Take 1 tablet by mouth as needed.     cetirizine (ZYRTEC) 10 MG tablet Take 10 mg by mouth as needed.     dextromethorphan-guaiFENesin (MUCINEX DM) 30-600 MG 12hr tablet Take 1 tablet by mouth as needed.     fluticasone (FLONASE) 50 MCG/ACT nasal spray Place 1 spray into the nose daily as needed for allergies.      simvastatin (ZOCOR) 20 MG tablet Take 20 mg by mouth every morning.     vitamin B-12 (CYANOCOBALAMIN) 100 MCG tablet Take 100 mcg by mouth daily.     No current facility-administered medications for this visit.   Facility-Administered Medications Ordered in Other Visits  Medication Dose Route Frequency Provider Last Rate Last Admin   0.9 %  sodium chloride infusion   Intravenous  Continuous Manya Silvas, MD   New Bag at 03/02/22 [phone]   Review of Systems  Constitutional:  Negative for appetite change, chills, fatigue and fever.  HENT:   Positive for hearing loss. Negative for voice change.   Eyes:  Negative for eye problems.  Respiratory:  Negative for chest tightness and cough.   Cardiovascular:  Negative for chest pain.  Gastrointestinal:  Negative for abdominal distention, abdominal pain and blood in stool.  Endocrine: Negative for hot flashes.  Genitourinary:  Negative for difficulty urinating and frequency.   Musculoskeletal:  Negative for arthralgias.       Chronic intermittent back pain  Skin:  Negative for itching and rash.  Neurological:  Negative for extremity weakness.  Hematological:  Negative for adenopathy.  Psychiatric/Behavioral:  Negative for confusion.     Performance status (ECOG): 0 - Asymptomatic  Vital Signs BP (!) 175/65 (BP Location: Left Arm, Patient Position: Sitting, Cuff Size: Normal)   Pulse 62   Temp (!) 97.3 F (36.3 C) (Tympanic)   Wt 131 lb 6.4 oz (59.6 kg)   SpO2 96%   BMI 24.03 kg/m   Physical Exam Constitutional:      General: She is not in acute distress.    Appearance: She is not diaphoretic.  HENT:     Head: Normocephalic and atraumatic.     Mouth/Throat:     Pharynx: No oropharyngeal exudate.  Eyes:     General: No scleral icterus.    Pupils: Pupils are equal, round, and reactive to light.  Cardiovascular:     Rate and Rhythm: Normal rate and regular rhythm.     Heart sounds: No murmur heard. Pulmonary:     Effort: Pulmonary effort is normal. No respiratory distress.  Abdominal:     General: There is no distension.     Palpations: Abdomen is soft.     Tenderness: There is no abdominal tenderness.  Musculoskeletal:        General: Normal range of motion.     Cervical back: Normal range of motion and neck supple.  Skin:    General: Skin is warm and dry.     Findings: No erythema.  Neurological:      Mental Status: She is alert and oriented to person, place, and time.  Psychiatric:        Mood and Affect: Affect normal.     RADIOGRAPHIC STUDIES: I have personally reviewed the radiological images as listed and agreed with the findings in  the report. CT Chest Wo Contrast  Result Date: 03/17/2022 CLINICAL DATA:  Lung cancer.  * Tracking Code: BO * EXAM: CT CHEST WITHOUT CONTRAST TECHNIQUE: Multidetector CT imaging of the chest was performed following the standard protocol without IV contrast. RADIATION DOSE REDUCTION: This exam was performed according to the departmental dose-optimization program which includes automated exposure control, adjustment of the mA and/or kV according to patient size and/or use of iterative reconstruction technique. COMPARISON:  03/16/2021 and CT abdomen 11/25/2020. FINDINGS: Cardiovascular: Atherosclerotic calcification of the aorta, aortic valve and coronary arteries. Heart is at the upper limits of normal in size to mildly enlarged. No pericardial effusion. Mediastinum/Nodes: 10 mm low right paratracheal lymph node, stable. Hilar regions are difficult to evaluate without IV contrast. No axillary adenopathy. Esophagus is grossly unremarkable. Lungs/Pleura: Somewhat nodular appearing biapical pleuroparenchymal scarring, unchanged. Centrilobular emphysema. Focal irregular apical left upper lobe nodule measures approximately 6 x 7 mm (3/31), unchanged. Postoperative scarring in the right upper lobe. Relatively amorphous nodular ground-glass in the posteromedial right lower lobe measures approximately 11 x 13 mm (3/84), unchanged. Pleuroparenchymal scarring in the anterolateral right hemithorax. 3 mm smudgy nodule in the right middle lobe, with a tag to the adjacent pleura, unchanged and likely a benign subpleural lymph node. Postoperative scarring in the left lower lobe. No new pulmonary nodules. No pleural fluid. Airway is unremarkable. Upper Abdomen: Low-attenuation  lesions in the liver measure up to 2.2 cm, similar and likely cysts. Visualized portions of the liver, gallbladder, adrenal glands and right kidney are otherwise unremarkable. Partially imaged severe left hydronephrosis, unchanged from 03/24/2021 and seen on CT abdomen 11/25/2020. Visualized portions of the spleen, pancreas, stomach and bowel are grossly unremarkable with the exception of a small hiatal hernia. No upper abdominal adenopathy. Musculoskeletal: Degenerative changes in the spine. Old left rib fractures. No worrisome lytic or sclerotic lesions. IMPRESSION: 1. No definitive evidence of recurrent or metastatic disease. 2. Somewhat amorphous ground-glass nodule in the posterior right lower lobe, stable. Recommend continued attention on follow-up imaging as indolent adenocarcinoma can have this appearance. 3. 7 mm apical left upper lobe nodule, stable. Recommend continued attention on follow-up. 4. Partially imaged left hydronephrosis, unchanged and possibly due to a chronic ureteropelvic junction obstruction. 5. Aortic atherosclerosis (ICD10-I70.0). Coronary artery calcification. 6.  Emphysema (ICD10-J43.9). Electronically Signed   By: Lorin Picket M.D.   On: 03/17/2022 09:36   DG OR UROLOGY CYSTO IMAGE ([facility] ONLY)  Result Date: 03/02/2022 There is no interpretation for this exam.  This order is for images obtained during a surgical procedure.  Please See "Surgeries" Tab for more information regarding the procedure.     Laboratory results were reviewed by me    Latest Ref Rng & Units 03/18/2022   10:02 AM 02/25/2022   10:49 AM 03/16/2021    8:35 AM  CBC  WBC 4.0 - 10.5 K/uL 6.8  6.0  6.3   Hemoglobin 12.0 - 15.0 g/dL 15.1  14.4  16.0   Hematocrit 36.0 - 46.0 % 45.5  43.8  48.2   Platelets 150 - 400 K/uL 153  203  177       Latest Ref Rng & Units 03/18/2022   10:02 AM 02/25/2022   10:49 AM 03/16/2021    8:35 AM  CMP  Glucose 70 - 99 mg/dL 100  88  113   BUN 8 - 23 mg/dL 23  29  19     Creatinine 0.44 - 1.00 mg/dL 0.97  0.81  0.82  Sodium 135 - 145 mmol/L 141  140  140   Potassium 3.5 - 5.1 mmol/L 4.4  3.9  3.9   Chloride 98 - 111 mmol/L 105  106  105   CO2 22 - 32 mmol/L 28  27  28    Calcium 8.9 - 10.3 mg/dL 9.7  9.2  9.6   Total Protein 6.5 - 8.1 g/dL 7.2   7.3   Total Bilirubin 0.3 - 1.2 mg/dL 0.8   0.5   Alkaline Phos 38 - 126 U/L 54   57   AST 15 - 41 U/L 24   24   ALT 0 - 44 U/L 20   16

## 2022-03-18 NOTE — Assessment & Plan Note (Addendum)
#  History of stage Ib right lung upper lobe adenocarcinoma status post wedge resection in 2016 History of left lower lobe adenocarcinoma T1 a NX status post wedge resection 2019. Clinically patient is doing very well. She has been 5 years post her lung cancer surgery. 03/16/2022 CT chest without contrast showed no evidence of cancer recurrence, RLL ground-glass nodule  and LUL nodule, continue observation.  Continue annual CT scan surveillance. Marland Kitchen

## 2022-03-18 NOTE — Assessment & Plan Note (Signed)
Same plan as above.

## 2022-03-18 NOTE — Assessment & Plan Note (Signed)
JAK2 V617F mutation negative, with reflex to other mutations CALR, MPL, JAK 2 Ex 12-15 mutations negative. Negative BCR ABL. Most likely this is secondary erythrocytosis.  I suspect that this may be secondary to hypoxia due to underlying chronic lung diseases/emphysema or sleep apnea.  No need for phlebotomy given that hematocrit is less than 52.

## 2022-09-01 ENCOUNTER — Ambulatory Visit
Admission: RE | Admit: 2022-09-01 | Discharge: 2022-09-01 | Disposition: A | Discharge status: Home / Self Care | Payer: Medicare HMO | Source: Ambulatory Visit | Attending: Urology | Admitting: Urology

## 2022-09-01 ENCOUNTER — Encounter: Payer: Self-pay | Admitting: Urology

## 2022-09-01 ENCOUNTER — Ambulatory Visit: Payer: Medicare HMO | Admitting: Urology

## 2022-09-01 VITALS — BP 132/70 | HR 84 | Ht 62.0 in | Wt 132.0 lb

## 2022-09-01 DIAGNOSIS — N200 Calculus of kidney: Secondary | ICD-10-CM

## 2022-09-01 DIAGNOSIS — N135 Crossing vessel and stricture of ureter without hydronephrosis: Secondary | ICD-10-CM

## 2022-09-01 NOTE — Progress Notes (Addendum)
I, Heather [name], acting as a scribe for Riki Altes, MD., have documented all relevant documentation on the behalf of Riki Altes, MD, as directed by Riki Altes, MD while in the presence of Riki Altes, MD.  09/01/2022 11:02 AM   Heather [name] [birth year] [id number]  Referring provider: Danella Penton, MD [phone] [facility] [address] [facility] [place],  [place] [address]  Chief Complaint  Patient presents with   Other   Urologic history: 1. Left UPJ obstruction Chronic left UPJ obstruction, however admitted with pyelonephritis/pyonephrosis with sepsis November 2022 and underwent stent placement.  Elected a chronic indwelling stent.  HPI: Heather [name] is a 87 y.o. female presents for 6 month follow-up.  Last stent exchange 03/02/22 with a Bard Optima stent.  Since stent exchange, she has done well and has no complaints.  No bothersome LUTS and denies recurrent UTI.    PMH: Past Medical History:  Diagnosis Date   Anginal pain (HCC)    Brain aneurysm    Cancer (HCC) lung   2016   Carotid artery stenosis    Carotid stenosis 06/19/2013   Colonic polyp    Emphysema lung (HCC)    GERD (gastroesophageal reflux disease)    Hiatal hernia    Hypercholesteremia    Hyperlipidemia, mixed 06/09/2015   Hypertension    Lung nodule 06/21/2014   Macrocytic 10/06/2014   PAT (paroxysmal atrial tachycardia) 06/19/2013   Plantar fasciitis     Surgical History: Past Surgical History:  Procedure Laterality Date   BREAST EXCISIONAL BIOPSY Right 40 yrs ago   neg   BREAST LUMPECTOMY     benign   CATARACT EXTRACTION W/ INTRAOCULAR LENS  IMPLANT, BILATERAL Bilateral    CEREBRAL ANEURYSM REPAIR  2004   CERVIX LESION DESTRUCTION     COLONOSCOPY WITH PROPOFOL N/A 03/31/2015   Procedure: COLONOSCOPY WITH PROPOFOL;  Surgeon: Scot Jun, MD;  Location: [facility] ENDOSCOPY;  Service: Endoscopy;  Laterality: N/A;   CYSTOSCOPY W/ RETROGRADES Left 03/02/2022    Procedure: CYSTOSCOPY WITH RETROGRADE PYELOGRAM;  Surgeon: Riki Altes, MD;  Location: [facility] ORS;  Service: Urology;  Laterality: Left;   CYSTOSCOPY W/ URETERAL STENT PLACEMENT Left 03/03/2021   Procedure: CYSTOSCOPY WITH EXCHANGE;  Surgeon: Riki Altes, MD;  Location: [facility] ORS;  Service: Urology;  Laterality: Left;   CYSTOSCOPY W/ URETERAL STENT PLACEMENT Left 03/02/2022   Procedure: CYSTOSCOPY WITH STENT EXCHANGE;  Surgeon: Riki Altes, MD;  Location: [facility] ORS;  Service: Urology;  Laterality: Left;   CYSTOSCOPY WITH STENT PLACEMENT Left 11/25/2020   Procedure: CYSTOSCOPY WITH STENT PLACEMENT;  Surgeon: Riki Altes, MD;  Location: [facility] ORS;  Service: Urology;  Laterality: Left;   FLEXIBLE BRONCHOSCOPY N/A 03/10/2017   Procedure: FLEXIBLE BRONCHOSCOPY;  Surgeon: Hulda Marin, MD;  Location: [facility] ORS;  Service: Thoracic;  Laterality: N/A;   THORACOTOMY/LOBECTOMY Left 03/10/2017   Procedure: THORACOTOMY WITH LUNG WEDGE RESECTION POSSIBLE LOBECTOMY;  Surgeon: Hulda Marin, MD;  Location: [facility] ORS;  Service: Thoracic;  Laterality: Left;   TUBAL LIGATION      Home Medications:  Allergies as of 09/01/2022       Reactions   Ace Inhibitors Swelling   Contrast Media [iodinated Contrast Media]    Cortisone    Patient had facial swelling and itching from cortisone injection IM        Medication List        Accurate as of September 01, 2022 11:02  AM. If you have any questions, ask your nurse or doctor.          amLODipine 5 MG tablet Commonly known as: NORVASC Take 5 mg by mouth at bedtime.   atenolol 50 MG tablet Commonly known as: TENORMIN Take 50 mg by mouth at bedtime.   calcium-vitamin D 500-200 MG-UNIT tablet Commonly known as: OSCAL WITH D Take 1 tablet by mouth as needed.   cetirizine 10 MG tablet Commonly known as: ZYRTEC Take 10 mg by mouth as needed.   cyanocobalamin 100 MCG tablet Commonly known as: VITAMIN B12 Take 100 mcg by mouth daily.    dextromethorphan-guaiFENesin 30-600 MG 12hr tablet Commonly known as: MUCINEX DM Take 1 tablet by mouth as needed.   fluticasone 50 MCG/ACT nasal spray Commonly known as: FLONASE Place 1 spray into the nose daily as needed for allergies.   simvastatin 20 MG tablet Commonly known as: ZOCOR Take 20 mg by mouth every morning.        Allergies:  Allergies  Allergen Reactions   Ace Inhibitors Swelling   Contrast Media [Iodinated Contrast Media]    Cortisone     Patient had facial swelling and itching from cortisone injection IM    Family History: Family History  Problem Relation Age of Onset   Alcohol abuse Mother    Heart attack Father    Breast cancer Neg Hx     Social History:  reports that she quit smoking about 34 years ago. Her smoking use included cigarettes. She started smoking about 69 years ago. She has a 35 pack-year smoking history. She has never used smokeless tobacco. She reports current alcohol use. She reports that she does not use drugs.   Physical Exam: BP 132/70   Pulse 84   Ht 5\' 2"  (1.575 m)   Wt 132 lb (59.9 kg)   BMI 24.14 kg/m   Constitutional:  Alert and oriented, No acute distress. HEENT:  AT Respiratory: Normal respiratory effort, no increased work of breathing. Psychiatric: Normal mood and affect.   Assessment & Plan:    1. Chronic left UPJ obstruction  KUB to assess for stent encrustation.  If no encrustation noted, we'll tentatively plan a cystoscopy with stent exchange February 2025.  I have reviewed the above documentation for accuracy and completeness, and I agree with the above.   Riki Altes, MD  [facility] [address], [address] [place], [place] [address] [phone]

## 2022-09-02 ENCOUNTER — Encounter: Payer: Self-pay | Admitting: Urology

## 2022-09-09 ENCOUNTER — Telehealth: Payer: Self-pay

## 2022-09-09 ENCOUNTER — Other Ambulatory Visit: Payer: Self-pay

## 2022-09-09 ENCOUNTER — Other Ambulatory Visit: Payer: Self-pay | Admitting: Urology

## 2022-09-09 DIAGNOSIS — N135 Crossing vessel and stricture of ureter without hydronephrosis: Secondary | ICD-10-CM

## 2022-09-09 NOTE — Progress Notes (Signed)
Surgical Physician Order Form [facility] [place]  Dr. Lonna Cobb, MD  * Scheduling expectation : Between December 2024-February 2025 depending on patient preference  *Length of Case: 30 minutes  *Clearance needed: no  *Anticoagulation Instructions: N/A  *Aspirin Instructions: N/A  *Post-op visit Date/Instructions:  6 month follow up with KUB  *Diagnosis: Chronic left UPJ obstruction  *Procedure:   Cysto w/ left ureteral stent exchange ([id number])   Additional orders: N/A  -Admit type: OUTpatient  -Anesthesia: Choice  -VTE Prophylaxis Standing Order SCD's       Other:   -Standing Lab Orders Per Anesthesia    Lab other: UA&Urine Culture  -Standing Test orders EKG/Chest x-ray per Anesthesia       Test other:   - Medications:  Ancef 2gm IV  -Other orders:  N/A

## 2022-09-09 NOTE — Telephone Encounter (Signed)
-----   Message from Verna Czech [facility] sent at 09/09/2022  1:00 PM EDT ----- KUB shows no stent encrustation.  Will plan cystoscopy with stent exchange sometime between December-February depending on patient preference.  Will send orders

## 2022-09-09 NOTE — Telephone Encounter (Signed)
Spoke with pt. Pt. Advised of results and verbalized understanding. Scheduled for Surgery in January 2025.

## 2022-09-10 ENCOUNTER — Telehealth: Payer: Self-pay

## 2022-09-10 NOTE — Telephone Encounter (Signed)
Per Dr. Lonna Cobb, Patient is to be scheduled for Cystoscopy with Left Ureteral Stent Exchange   Mrs. Hodde was contacted and possible surgical dates were discussed, Tuesday January 7th, 2025 was agreed upon for surgery.   Patient was instructed that Dr. Lonna Cobb will require them to provide a pre-op UA & CX prior to surgery. This was ordered and scheduled drop off appointment was made for 01/14/2023.    Patient was directed to call [phone] between 1-3pm the day before surgery to find out surgical arrival time.  Instructions were given not to eat or drink from midnight on the night before surgery and have a driver for the day of surgery. On the surgery day patient was instructed to enter through the Medical Mall entrance of [facility] report the Same Day Surgery desk.   Pre-Admit Testing will be in contact via phone to set up an interview with the anesthesia team to review your history and medications prior to surgery.   Reminder of this information was sent via MAILED to the patient.

## 2022-09-10 NOTE — Progress Notes (Addendum)
   [facility] Posting Form  Surgery Date: Date: 12/28/2022  Surgeon: Dr. Irineo Axon, MD  Inpt ( No  )   Outpt (Yes)   Obs ( No  )   Diagnosis: N13.5 Chronic Left Ureteropelvic Junction Obstruction  -CPT: 16109  Surgery: Cystoscopy with Left Ureteral Stent Exchange  Stop Anticoagulations: No  Cardiac/Medical/Pulmonary Clearance needed: no  *Orders entered into EPIC  Date: 09/10/22   *Case booked in [place]  Date: 09/10/22  *Notified pt of Surgery: Date: 09/09/2022  PRE-OP UA & CX: Yes, will obtain in clinic on 12/13/2022  *Placed into Prior Authorization Work Angela Nevin Date: 09/10/22  Assistant/laser/rep:No

## 2022-12-13 ENCOUNTER — Other Ambulatory Visit: Payer: Medicare HMO

## 2022-12-13 DIAGNOSIS — N135 Crossing vessel and stricture of ureter without hydronephrosis: Secondary | ICD-10-CM

## 2022-12-13 LAB — URINALYSIS, COMPLETE
Bilirubin, UA: NEGATIVE
Glucose, UA: NEGATIVE
Ketones, UA: NEGATIVE
Nitrite, UA: NEGATIVE
Specific Gravity, UA: 1.02 (ref 1.005–1.030)
Urobilinogen, Ur: 0.2 mg/dL (ref 0.2–1.0)
pH, UA: 5.5 (ref 5.0–7.5)

## 2022-12-13 LAB — MICROSCOPIC EXAMINATION: WBC, UA: >30 /[HPF] — AB (ref 0–5)

## 2022-12-15 ENCOUNTER — Ambulatory Visit
Admission: RE | Admit: 2022-12-15 | Discharge: 2022-12-15 | Disposition: A | Discharge status: Home / Self Care | Payer: Medicare HMO | Source: Ambulatory Visit | Attending: Family Medicine | Admitting: Family Medicine

## 2022-12-15 ENCOUNTER — Ambulatory Visit: Payer: Medicare HMO

## 2022-12-15 VITALS — BP 158/81 | HR 72 | Temp 98.5°F | Resp 19

## 2022-12-15 DIAGNOSIS — J069 Acute upper respiratory infection, unspecified: Secondary | ICD-10-CM | POA: Diagnosis not present

## 2022-12-15 DIAGNOSIS — J439 Emphysema, unspecified: Secondary | ICD-10-CM | POA: Diagnosis not present

## 2022-12-15 LAB — RESP PANEL BY RT-PCR (RSV, FLU A&B, COVID)  RVPGX2
Influenza A by PCR: NEGATIVE
Influenza B by PCR: NEGATIVE
Resp Syncytial Virus by PCR: NEGATIVE
SARS Coronavirus 2 by RT PCR: NEGATIVE

## 2022-12-15 NOTE — ED Provider Notes (Signed)
[facility]    CSN: [id number] Arrival date & time: 12/15/22  0856      History   Chief Complaint Chief Complaint  Patient presents with   Fever   Cough   Headache    HPI Heather [name] is a 87 y.o. female.   HPI  History obtained from the patient. Heather [name] presents for cough, sneezing, rhinorrhea, headache with slight sore throat that started 2-3 days ago.  No fever and vomiting. Endorses diarrhea.  Denies painful swallowing, ear pain and abdominal pain. Took Cipro and Zpac that was prescribed by her PCP 8 days ago. She has bilateral lung cancer and had lung surgery.  She quit smoking when she was 87 yo.     Past Medical History:  Diagnosis Date   Anginal pain (HCC)    Brain aneurysm    Cancer (HCC) lung   2016   Carotid artery stenosis    Carotid stenosis 06/19/2013   Colonic polyp    Emphysema lung (HCC)    GERD (gastroesophageal reflux disease)    Hiatal hernia    Hypercholesteremia    Hyperlipidemia, mixed 06/09/2015   Hypertension    Lung nodule 06/21/2014   Macrocytic 10/06/2014   PAT (paroxysmal atrial tachycardia) (HCC) 06/19/2013   Plantar fasciitis     Patient Active Problem List   Diagnosis Date Noted   Erythrocytosis 03/18/2022   Hypoxia 11/25/2020   Acute pyelonephritis 11/25/2020   Acute lower UTI 11/25/2020   Hydronephrosis of left kidney 11/25/2020   Panlobular emphysema (HCC) 11/28/2018   Benign essential hypertension 08/17/2017   Malignant neoplasm of lower lobe of left lung (HCC) 03/10/2017   Medicare annual wellness visit, initial 07/08/2016   DDD (degenerative disc disease), lumbar 02/10/2016   Hyperlipidemia, mixed 06/09/2015   Macrocytic 10/06/2014   Lung nodule 06/21/2014   Primary cancer of right upper lobe of lung (HCC) 03/13/2014   Brain aneurysm 06/19/2013   Carotid stenosis 06/19/2013   GERD (gastroesophageal reflux disease) 06/19/2013   PAT (paroxysmal atrial tachycardia) (HCC) 06/19/2013    Past Surgical  History:  Procedure Laterality Date   BREAST EXCISIONAL BIOPSY Right 40 yrs ago   neg   BREAST LUMPECTOMY     benign   CATARACT EXTRACTION W/ INTRAOCULAR LENS  IMPLANT, BILATERAL Bilateral    CEREBRAL ANEURYSM REPAIR  2004   CERVIX LESION DESTRUCTION     COLONOSCOPY WITH PROPOFOL N/A 03/31/2015   Procedure: COLONOSCOPY WITH PROPOFOL;  Surgeon: Scot Jun, MD;  Location: [facility] ENDOSCOPY;  Service: Endoscopy;  Laterality: N/A;   CYSTOSCOPY W/ RETROGRADES Left 03/02/2022   Procedure: CYSTOSCOPY WITH RETROGRADE PYELOGRAM;  Surgeon: Riki Altes, MD;  Location: [facility] ORS;  Service: Urology;  Laterality: Left;   CYSTOSCOPY W/ URETERAL STENT PLACEMENT Left 03/03/2021   Procedure: CYSTOSCOPY WITH EXCHANGE;  Surgeon: Riki Altes, MD;  Location: [facility] ORS;  Service: Urology;  Laterality: Left;   CYSTOSCOPY W/ URETERAL STENT PLACEMENT Left 03/02/2022   Procedure: CYSTOSCOPY WITH STENT EXCHANGE;  Surgeon: Riki Altes, MD;  Location: [facility] ORS;  Service: Urology;  Laterality: Left;   CYSTOSCOPY WITH STENT PLACEMENT Left 11/25/2020   Procedure: CYSTOSCOPY WITH STENT PLACEMENT;  Surgeon: Riki Altes, MD;  Location: [facility] ORS;  Service: Urology;  Laterality: Left;   FLEXIBLE BRONCHOSCOPY N/A 03/10/2017   Procedure: FLEXIBLE BRONCHOSCOPY;  Surgeon: Hulda Marin, MD;  Location: [facility] ORS;  Service: Thoracic;  Laterality: N/A;   THORACOTOMY/LOBECTOMY Left 03/10/2017   Procedure: THORACOTOMY WITH LUNG WEDGE  RESECTION POSSIBLE LOBECTOMY;  Surgeon: Hulda Marin, MD;  Location: [facility] ORS;  Service: Thoracic;  Laterality: Left;   TUBAL LIGATION      OB History   No obstetric history on file.      Home Medications    Prior to Admission medications   Medication Sig Start Date End Date Taking? Authorizing Provider  atenolol (TENORMIN) 50 MG tablet Take 50 mg by mouth at bedtime.   Yes [provider]  calcium-vitamin D (OSCAL WITH D) 500-200 MG-UNIT tablet Take 1 tablet by mouth as  needed.   Yes [provider]  cetirizine (ZYRTEC) 10 MG tablet Take 10 mg by mouth as needed.   Yes [provider]  simvastatin (ZOCOR) 20 MG tablet Take 20 mg by mouth every morning.   Yes [provider]  vitamin B-12 (CYANOCOBALAMIN) 100 MCG tablet Take 100 mcg by mouth daily.   Yes [provider]  amLODipine (NORVASC) 5 MG tablet Take 5 mg by mouth at bedtime. 06/10/17 03/18/22  [provider]  dextromethorphan-guaiFENesin (MUCINEX DM) 30-600 MG 12hr tablet Take 1 tablet by mouth as needed.    [provider]  fluticasone (FLONASE) 50 MCG/ACT nasal spray Place 1 spray into the nose daily as needed for allergies.  04/05/15   [provider]    Family History Family History  Problem Relation Age of Onset   Alcohol abuse Mother    Heart attack Father    Breast cancer Neg Hx     Social History Social History   Tobacco Use   Smoking status: Former    Current packs/day: 0.00    Average packs/day: 1 pack/day for 35.0 years (35.0 ttl pk-yrs)    Types: Cigarettes    Start date: 06/10/1953    Quit date: 06/10/1988    Years since quitting: 34.5   Smokeless tobacco: Never  Vaping Use   Vaping status: Never Used  Substance Use Topics   Alcohol use: Yes    Alcohol/week: 0.0 standard drinks of alcohol    Comment: glass of wine daily   Drug use: No     Allergies   Ace inhibitors, Contrast media [iodinated contrast media], and Cortisone   Review of Systems Review of Systems: negative unless otherwise stated in HPI.      Physical Exam Triage Vital Signs ED Triage Vitals [12/15/22 0927]  Encounter Vitals Group     BP (!) 198/82     Systolic BP Percentile      Diastolic BP Percentile      Pulse Rate 72     Resp 19     Temp 98.5 F (36.9 C)     Temp Source Oral     SpO2 94 %     Weight      Height      Head Circumference      Peak Flow      Pain Score 0     Pain Loc      Pain Education      Exclude from  Growth Chart    No data found.  Updated Vital Signs BP (!) 158/81 (BP Location: Right Arm)   Pulse 72   Temp 98.5 F (36.9 C) (Oral)   Resp 19   SpO2 94%   Visual Acuity Right Eye Distance:   Left Eye Distance:   Bilateral Distance:    Right Eye Near:   Left Eye Near:    Bilateral Near:     Physical Exam  GEN:     alert, non-toxic appearing elderly female in no distress    HENT:  mucus membranes moist, oropharyngeal without lesions, moderate erythema, no tonsillar hypertrophy or exudates, clear nasal discharge EYES:   no scleral injection or discharge RESP:  no increased work of breathing, clear to auscultation bilaterally CVS:   regular rate and rhythm Skin:   warm and dry    UC Treatments / Results  Labs (all labs ordered are listed, but only abnormal results are displayed) Labs Reviewed  RESP PANEL BY RT-PCR (RSV, FLU A&B, COVID)  [id number]    EKG   Radiology DG Chest 2 View  Result Date: 12/15/2022 CLINICAL DATA:  Cough.  History of lung cancer. EXAM: CHEST - 2 VIEW COMPARISON:  November 25, 2020. FINDINGS: The heart size and mediastinal contours are within normal limits. Both lungs are clear. Hyperexpansion of the lungs is noted. The visualized skeletal structures are unremarkable. IMPRESSION: No active cardiopulmonary disease. Electronically Signed   By: Lupita Raider M.D.   On: 12/15/2022 13:32    Procedures Procedures (including critical care time)  Medications Ordered in UC Medications - No data to display  Initial Impression / Assessment and Plan / UC Course  I have reviewed the triage vital signs and the nursing notes.  Pertinent labs & imaging results that were available during my care of the patient were reviewed by me and considered in my medical decision making (see chart for details).       Pt is a 87 y.o. female with emphysema and lung cancer who presents for 3 days of respiratory symptoms. Manna is afebrile here without recent  antipyretics. Satting 94% on room air.   She is hypertensive BP 190/86.  She has didn't take her blood pressure medications this morning. She is prescribed amlodipine 5 mg, atenolol 50 mg, BP on recheck after having patient sit was 158/81.    Overall pt is non-toxic appearing, well hydrated, without respiratory distress. Pulmonary exam is unremarkable.  COVID, influenza and RSV panel obtained and was negative.  Given her history of lung cancer obtained a chest xray. Chest xray personally reviewed by me without focal pneumonia, pleural effusion, cardiomegaly or pneumothorax. She has some scarring   History consistent with viral respiratory illness. Discussed symptomatic treatment.  Explained lack of efficacy of antibiotics in viral disease.  Typical duration of symptoms discussed.  Patient declined steroids.  Return and ED precautions given and voiced understanding. Discussed MDM, treatment plan and plan for follow-up with patient who agrees with plan.   Radiology impression reviewed  Final Clinical Impressions(s) / UC Diagnoses   Final diagnoses:  Viral URI with cough  Pulmonary emphysema, unspecified emphysema type [facility])     Discharge Instructions      Your COVID, influenza and RSV/respiratory syncytial virus testing was negative.  In reviewing your chest x-ray it looks improved from your CT scan back in February 2024.  I did not see any significant pneumonia but you do have some scarring.  When the radiologist read your x-ray if they see something that I did not, I will call you.    Use your cough syrup.  When you get home, be sure to take your blood pressure medications.      ED Prescriptions   None    PDMP not reviewed this encounter.   Katha Cabal, DO 12/15/22 1335

## 2022-12-15 NOTE — Discharge Instructions (Addendum)
Your COVID, influenza and RSV/respiratory syncytial virus testing was negative.  In reviewing your chest x-ray it looks improved from your CT scan back in February 2024.  I did not see any significant pneumonia but you do have some scarring.  When the radiologist read your x-ray if they see something that I did not, I will call you.    Use your cough syrup.  When you get home, be sure to take your blood pressure medications.

## 2022-12-15 NOTE — ED Triage Notes (Signed)
cough ha runny nose 3 days low grade fever. Sneezing. Only has a headache when she sneezes. Patient states that she just finished a z-pack 8 days ago.

## 2022-12-19 LAB — CULTURE, URINE COMPREHENSIVE

## 2022-12-21 ENCOUNTER — Telehealth: Payer: Self-pay

## 2022-12-21 MED ORDER — AMOXICILLIN 875 MG PO TABS: 875.0000 mg | ORAL_TABLET | Freq: Two times a day (BID) | ORAL | 0 refills | Status: DC

## 2022-12-21 NOTE — Telephone Encounter (Signed)
-----   Message from Verna Czech [facility] sent at 12/20/2022  7:30 AM EST ----- Preop urine culture growing bacteria.  Please send Rx amoxicillin 875 mg twice daily x 7 days to start Friday 11/29.

## 2022-12-21 NOTE — Telephone Encounter (Signed)
Spoke with pt. Pt. Advised of all results and verbalized understanding. Medication has been sent to pharmacy per patient request.

## 2022-12-22 ENCOUNTER — Encounter
Admission: RE | Admit: 2022-12-22 | Discharge: 2022-12-22 | Disposition: A | Discharge status: Home / Self Care | Payer: Medicare HMO | Source: Ambulatory Visit | Attending: Urology | Admitting: Urology

## 2022-12-22 ENCOUNTER — Other Ambulatory Visit: Payer: Self-pay

## 2022-12-22 NOTE — Patient Instructions (Addendum)
Your procedure is scheduled on: 12/28/22 - Tuesday Report to the Registration Desk on the 1st floor of the Medical Mall. To find out your arrival time, please call [phone] between 1PM - 3PM on: 12/27/22 - Monday If your arrival time is 6:00 am, do not arrive before that time as the Medical Mall entrance doors do not open until 6:00 am.  REMEMBER: Instructions that are not followed completely may result in serious medical risk, up to and including death; or upon the discretion of your surgeon and anesthesiologist your surgery may need to be rescheduled.  Do not eat food or drink any liquids after midnight the night before surgery.  No gum chewing or hard candies.  One week prior to surgery: Stop Anti-inflammatories (NSAIDS) such as Advil, Aleve, Ibuprofen, Motrin, Naproxen, Naprosyn and Aspirin based products such as Excedrin, Goody's Powder, BC Powder. You may take Tylenol if needed for pain up until the day of surgery.  Stop ANY OVER THE COUNTER supplements until after surgery : calcium-vitamin D (OSCAL WITH D)    ON THE DAY OF SURGERY ONLY TAKE THESE MEDICATIONS WITH SIPS OF WATER:  simvastatin (ZOCOR)    No Alcohol for 24 hours before or after surgery.  No Smoking including e-cigarettes for 24 hours before surgery.  No chewable tobacco products for at least 6 hours before surgery.  No nicotine patches on the day of surgery.  Do not use any "recreational" drugs for at least a week (preferably 2 weeks) before your surgery.  Please be advised that the combination of cocaine and anesthesia may have negative outcomes, up to and including death. If you test positive for cocaine, your surgery will be cancelled.  On the morning of surgery brush your teeth with toothpaste and water, you may rinse your mouth with mouthwash if you wish. Do not swallow any toothpaste or mouthwash.  Use CHG Soap or wipes as directed on instruction sheet.  Do not wear jewelry, make-up, hairpins,  clips or nail polish.  For welded (permanent) jewelry: bracelets, anklets, waist bands, etc.  Please have this removed prior to surgery.  If it is not removed, there is a chance that hospital personnel will need to cut it off on the day of surgery.  Do not wear lotions, powders, or perfumes.   Do not shave body hair from the neck down 48 hours before surgery.  Contact lenses, hearing aids and dentures may not be worn into surgery.  Do not bring valuables to the hospital. [facility] is not responsible for any missing/lost belongings or valuables.   Notify your doctor if there is any change in your medical condition (cold, fever, infection).  Wear comfortable clothing (specific to your surgery type) to the hospital.  After surgery, you can help prevent lung complications by doing breathing exercises.  Take deep breaths and cough every 1-2 hours. Your doctor may order a device called an Incentive Spirometer to help you take deep breaths. When coughing or sneezing, hold a pillow firmly against your incision with both hands. This is called "splinting." Doing this helps protect your incision. It also decreases belly discomfort.  If you are being admitted to the hospital overnight, leave your suitcase in the car. After surgery it may be brought to your room.  In case of increased patient census, it may be necessary for you, the patient, to continue your postoperative care in the Same Day Surgery department.  If you are being discharged the day of surgery, you will not  be allowed to drive home. You will need a responsible individual to drive you home and stay with you for 24 hours after surgery.   If you are taking public transportation, you will need to have a responsible individual with you.  Please call the Pre-admissions Testing Dept. at [phone] if you have any questions about these instructions.  Surgery Visitation Policy:  Patients having surgery or a procedure may have two  visitors.  Children under the age of 60 must have an adult with them who is not the patient.  Inpatient Visitation:    Visiting hours are 7 a.m. to 8 p.m. Up to four visitors are allowed at one time in a patient room. The visitors may rotate out with other people during the day.  One visitor age 54 or older may stay with the patient overnight and must be in the room by 8 p.m.

## 2022-12-27 ENCOUNTER — Encounter
Admission: RE | Admit: 2022-12-27 | Discharge: 2022-12-27 | Disposition: A | Discharge status: Home / Self Care | Payer: Medicare HMO | Source: Ambulatory Visit | Attending: Urology | Admitting: Urology

## 2022-12-27 DIAGNOSIS — R001 Bradycardia, unspecified: Secondary | ICD-10-CM | POA: Insufficient documentation

## 2022-12-27 DIAGNOSIS — Z01810 Encounter for preprocedural cardiovascular examination: Secondary | ICD-10-CM | POA: Diagnosis not present

## 2022-12-27 MED ORDER — CEFAZOLIN SODIUM-DEXTROSE 2-4 GM/100ML-% IV SOLN
2.0000 g | INTRAVENOUS | Status: AC
  Administered 2022-12-28: 2 g via INTRAVENOUS

## 2022-12-28 ENCOUNTER — Ambulatory Visit: Payer: Medicare HMO | Admitting: Anesthesiology

## 2022-12-28 ENCOUNTER — Ambulatory Visit
Admission: RE | Admit: 2022-12-28 | Discharge: 2022-12-28 | Disposition: A | Discharge status: Home / Self Care | Payer: Medicare HMO | Attending: Urology | Admitting: Urology

## 2022-12-28 ENCOUNTER — Ambulatory Visit: Payer: Medicare HMO

## 2022-12-28 ENCOUNTER — Encounter
Admission: RE | Disposition: A | Discharge status: Home / Self Care | Payer: Self-pay | Source: Home / Self Care | Attending: Urology

## 2022-12-28 ENCOUNTER — Ambulatory Visit: Payer: Self-pay | Admitting: Urgent Care

## 2022-12-28 ENCOUNTER — Encounter: Payer: Self-pay | Admitting: Urology

## 2022-12-28 ENCOUNTER — Other Ambulatory Visit: Payer: Self-pay

## 2022-12-28 DIAGNOSIS — I739 Peripheral vascular disease, unspecified: Secondary | ICD-10-CM | POA: Diagnosis not present

## 2022-12-28 DIAGNOSIS — Z87891 Personal history of nicotine dependence: Secondary | ICD-10-CM | POA: Diagnosis not present

## 2022-12-28 DIAGNOSIS — N135 Crossing vessel and stricture of ureter without hydronephrosis: Secondary | ICD-10-CM | POA: Diagnosis present

## 2022-12-28 DIAGNOSIS — I10 Essential (primary) hypertension: Secondary | ICD-10-CM | POA: Insufficient documentation

## 2022-12-28 DIAGNOSIS — J449 Chronic obstructive pulmonary disease, unspecified: Secondary | ICD-10-CM | POA: Insufficient documentation

## 2022-12-28 DIAGNOSIS — Z8669 Personal history of other diseases of the nervous system and sense organs: Secondary | ICD-10-CM | POA: Insufficient documentation

## 2022-12-28 HISTORY — PX: CYSTOSCOPY W/ RETROGRADES: SHX1426

## 2022-12-28 HISTORY — PX: CYSTOSCOPY W/ URETERAL STENT PLACEMENT: SHX1429

## 2022-12-28 SURGERY — CYSTOSCOPY, FLEXIBLE, WITH STENT REPLACEMENT
Anesthesia: General | Laterality: Left

## 2022-12-28 MED ORDER — OXYCODONE HCL 5 MG PO TABS: 5.0000 mg | ORAL_TABLET | Freq: Once | ORAL | Status: DC | PRN

## 2022-12-28 MED ORDER — IOHEXOL 180 MG/ML  SOLN
INTRAMUSCULAR | Status: DC | PRN
  Administered 2022-12-28: 10 mL

## 2022-12-28 MED ORDER — OXYCODONE HCL 5 MG/5ML PO SOLN: 5.0000 mg | Freq: Once | ORAL | Status: DC | PRN

## 2022-12-28 MED ORDER — EPHEDRINE SULFATE-NACL 50-0.9 MG/10ML-% IV SOSY
PREFILLED_SYRINGE | INTRAVENOUS | Status: DC | PRN
  Administered 2022-12-28: 10 mg via INTRAVENOUS

## 2022-12-28 MED ORDER — SODIUM CHLORIDE 0.9 % IR SOLN
Status: DC | PRN
  Administered 2022-12-28: 3000 mL

## 2022-12-28 MED ORDER — EPHEDRINE 5 MG/ML INJ
INTRAVENOUS | Status: AC
  Filled 2022-12-28: qty 5

## 2022-12-28 MED ORDER — PROPOFOL 500 MG/50ML IV EMUL
INTRAVENOUS | Status: DC | PRN
  Administered 2022-12-28: 120 ug/kg/min via INTRAVENOUS
  Administered 2022-12-28: 50 ug/kg/min via INTRAVENOUS

## 2022-12-28 MED ORDER — CHLORHEXIDINE GLUCONATE 0.12 % MT SOLN
OROMUCOSAL | Status: AC
  Filled 2022-12-28: qty 15

## 2022-12-28 MED ORDER — CEFAZOLIN SODIUM-DEXTROSE 2-4 GM/100ML-% IV SOLN
INTRAVENOUS | Status: AC
  Filled 2022-12-28: qty 100

## 2022-12-28 MED ORDER — ONDANSETRON HCL 4 MG/2ML IJ SOLN: 4.0000 mg | Freq: Once | INTRAMUSCULAR | Status: DC | PRN

## 2022-12-28 MED ORDER — FENTANYL CITRATE (PF) 100 MCG/2ML IJ SOLN
INTRAMUSCULAR | Status: DC | PRN
  Administered 2022-12-28: 50 ug via INTRAVENOUS

## 2022-12-28 MED ORDER — FENTANYL CITRATE (PF) 100 MCG/2ML IJ SOLN: 25.0000 ug | INTRAMUSCULAR | Status: DC | PRN

## 2022-12-28 MED ORDER — LACTATED RINGERS IV SOLN: INTRAVENOUS | Status: DC | PRN

## 2022-12-28 MED ORDER — FENTANYL CITRATE (PF) 100 MCG/2ML IJ SOLN
INTRAMUSCULAR | Status: AC
  Filled 2022-12-28: qty 2

## 2022-12-28 MED ORDER — ACETAMINOPHEN 10 MG/ML IV SOLN: 1000.0000 mg | Freq: Once | INTRAVENOUS | Status: DC | PRN

## 2022-12-28 SURGICAL SUPPLY — 16 items
BAG DRAIN SIEMENS DORNER NS (MISCELLANEOUS) ×1 IMPLANT
BRUSH SCRUB EZ 1% IODOPHOR (MISCELLANEOUS) ×1 IMPLANT
CATH URETL OPEN 5X70 (CATHETERS) IMPLANT
GLOVE BIOGEL PI IND STRL 7.5 (GLOVE) ×1 IMPLANT
GOWN STRL REUS W/ TWL XL LVL3 (GOWN DISPOSABLE) ×1 IMPLANT
GUIDEWIRE STR DUAL SENSOR (WIRE) ×1 IMPLANT
IV NS IRRIG 3000ML ARTHROMATIC (IV SOLUTION) ×1 IMPLANT
KIT TURNOVER CYSTO (KITS) ×1 IMPLANT
PACK CYSTO AR (MISCELLANEOUS) ×1 IMPLANT
SET CYSTO W/LG BORE CLAMP LF (SET/KITS/TRAYS/PACK) ×1 IMPLANT
STENT URET 6FRX24 CONTOUR (STENTS) IMPLANT
STENT URET 6FRX26 CONTOUR (STENTS) IMPLANT
STENT URO INLAY 6FRX22CM (STENTS) IMPLANT
SURGILUBE 2OZ TUBE FLIPTOP (MISCELLANEOUS) ×1 IMPLANT
WATER STERILE IRR 1000ML POUR (IV SOLUTION) ×1 IMPLANT
WATER STERILE IRR 500ML POUR (IV SOLUTION) ×1 IMPLANT

## 2022-12-28 NOTE — Discharge Instructions (Signed)
DISCHARGE INSTRUCTIONS FOR KIDNEY STONE/URETERAL STENT   MEDICATIONS:  1. Resume all your other meds from home.  2.  AZO (over-the-counter) can help with the burning/stinging when you urinate.   ACTIVITY:  1. May resume regular activities in 24 hours.   SIGNS/SYMPTOMS TO CALL:  Common postoperative symptoms include urinary frequency, urgency, bladder spasm and blood in the urine  Please call us if you have a fever greater than 101.5, uncontrolled nausea/vomiting, uncontrolled pain, dizziness, unable to urinate, excessively bloody urine, chest pain, shortness of breath, leg swelling, leg pain, or any other concerns or questions.   You can reach [place] at [phone].   FOLLOW-UP:  1. You will be contacted for a follow-up appointment

## 2022-12-28 NOTE — Anesthesia Postprocedure Evaluation (Signed)
Anesthesia Post Note  Patient: Heather [name]  Procedure(s) Performed: CYSTOSCOPY WITH STENT EXCHANGE (Left) CYSTOSCOPY WITH RETROGRADE PYELOGRAM (Left)  Patient location during evaluation: PACU Anesthesia Type: General Level of consciousness: awake and alert Pain management: pain level controlled Vital Signs Assessment: post-procedure vital signs reviewed and stable Respiratory status: spontaneous breathing, nonlabored ventilation, respiratory function stable and patient connected to nasal cannula oxygen Cardiovascular status: blood pressure returned to baseline and stable Postop Assessment: no apparent nausea or vomiting Anesthetic complications: no   There were no known notable events for this encounter.   Last Vitals:  Vitals:   12/28/22 0917 12/28/22 0927  BP: (!) 141/54 (!) 158/53  Pulse: (!) 58 63  Resp: 15 11  Temp:  (!) 36.1 C  SpO2: 94% 95%    Last Pain:  Vitals:   12/28/22 0927  TempSrc:   PainSc: 0-No pain                 Corinda Gubler

## 2022-12-28 NOTE — Interval H&P Note (Signed)
History and Physical Interval Note:  12/28/2022 8:09 AM  Heather [name]  has presented today for surgery, with the diagnosis of Chronic Left Ureteropelvic Junction Obstruction.  The various methods of treatment have been discussed with the patient and family. After consideration of risks, benefits and other options for treatment, the patient has consented to  Procedure(s): CYSTOSCOPY WITH STENT EXCHANGE (Left) as a surgical intervention.  The patient's history has been reviewed, patient examined, no change in status, stable for surgery.  I have reviewed the patient's chart and labs.  Questions were answered to the patient's satisfaction.    CV: RRR Lungs: Clear  Rigby Swamy C Abigail Marsiglia

## 2022-12-28 NOTE — Transfer of Care (Signed)
Immediate Anesthesia Transfer of Care Note  Patient: Heather [name]  Procedure(s) Performed: CYSTOSCOPY WITH STENT EXCHANGE (Left) CYSTOSCOPY WITH RETROGRADE PYELOGRAM (Left)  Patient Location: PACU  Anesthesia Type:General  Level of Consciousness: awake  Airway & Oxygen Therapy: Patient Spontanous Breathing  Post-op Assessment: Report given to RN and Post -op Vital signs reviewed and stable  Post vital signs: Reviewed and stable  Last Vitals:  Vitals Value Taken Time  BP 110/52 12/28/22 0901  Temp    Pulse 69 12/28/22 0902  Resp 18 12/28/22 0902  SpO2 98 % 12/28/22 0902  Vitals shown include unfiled device data.  Last Pain:  Vitals:   12/28/22 0811  TempSrc: Temporal  PainSc: 0-No pain         Complications: There were no known notable events for this encounter.

## 2022-12-28 NOTE — Op Note (Signed)
   Preoperative diagnosis:  Left UPJ obstruction  Postoperative diagnosis:  Left UPJ obstruction  Procedure: Cystoscopy with left ureteral stent exchange Left retrograde pyelogram  Surgeon: Riki Altes, MD  Anesthesia: MAC  Complications: None  Intraoperative findings:  Cystoscopy-mild inflammatory changes left UO secondary to indwelling stent.  No significant stent encrustation noted.  No solid or papillary bladder mucosal lesions Left retrograde pyelogram-narrowing at UPJ with left hydronephrosis  EBL: Minimal  Specimens: None  Indication: JOVIE SEIGEL is a 87 y.o. female with chronic left UPJ obstruction and an episode of pyelonephritis/pyonephrosis with sepsis in November 2022.  She has elected chronic indwelling ureteral stent with periodic stent exchange.  Last stent exchange February 2024.  After reviewing the management options for treatment, he elected to proceed with the above surgical procedure(s). We have discussed the potential benefits and risks of the procedure, side effects of the proposed treatment, the likelihood of the patient achieving the goals of the procedure, and any potential problems that might occur during the procedure or recuperation. Informed consent has been obtained.  Description of procedure:  The patient was taken to the operating room and sedation was obtained by anesthesia.  The patient was placed in the dorsal lithotomy position, prepped and draped in the usual sterile fashion, and preoperative antibiotics were administered. A preoperative time-out was performed.   A 21 French cystoscope was lubricated and passed per urethra.  Panendoscopy is performed with findings as described above.  The stent was grasped with endoscopic forceps and brought out through the urethral meatus.  A 0.038 Sensor wire was placed through the stent and advanced into the renal pelvis under fluoroscopic guidance without difficulty.  The stent was removed and a 5  [place] ureteral catheter was advanced over the guidewire to the region of the proximal ureter.  Retrograde pyelogram was then performed with findings as described above.  The guidewire was replaced and the ureteral catheter was removed.  A Bard Optima 18F/22 cm double-J ureteral stent was advanced over the wire and fluoroscopic guidance.  A good curl was seen in the renal pelvis and bladder on fluoroscopy.  After anesthetic reversal she was transported to the PACU in stable condition.  Plan: Office follow-up 6 months with KUB Tentative stent exchange 10-12 months   Riki Altes, M.D.

## 2022-12-28 NOTE — Addendum Note (Signed)
Addendum  created 12/28/22 0942 by Lysbeth Penner, CRNA   Flowsheet accepted

## 2022-12-28 NOTE — Anesthesia Preprocedure Evaluation (Signed)
Anesthesia Evaluation  Patient identified by MRN, date of birth, ID band Patient awake    Reviewed: Allergy & Precautions, NPO status , Patient's Chart, lab work & pertinent test results  History of Anesthesia Complications (+) PONV and history of anesthetic complications  Airway Mallampati: II   Neck ROM: Full    Dental no notable dental hx.    Pulmonary COPD, former smoker Lung CA s/p lobectomy 2019   Pulmonary exam normal breath sounds clear to auscultation       Cardiovascular hypertension, + Peripheral Vascular Disease (carotid stenosis)  Normal cardiovascular exam+ dysrhythmias (paroxysmal atrial tachycardia)  Rhythm:Regular Rate:Normal  ECG 11/27/20:  Sinus rhythm with Premature supraventricular complexes Otherwise normal ECG  Echo 10/30/20:  NORMAL LEFT VENTRICULAR SYSTOLIC FUNCTION  NORMAL RIGHT VENTRICULAR SYSTOLIC FUNCTION  MILD VALVULAR REGURGITATION   NO VALVULAR STENOSIS   Neuro/Psych Cerebral aneurysm s/p repair 2004    GI/Hepatic hiatal hernia,GERD  ,,  Endo/Other  negative endocrine ROS    Renal/GU Renal disease (nephrolithiasis)     Musculoskeletal  (+) Arthritis ,    Abdominal   Peds  Hematology negative hematology ROS (+)   Anesthesia Other Findings   Reproductive/Obstetrics                             Anesthesia Physical Anesthesia Plan  ASA: 3  Anesthesia Plan: General   Post-op Pain Management: Minimal or no pain anticipated   Induction: Intravenous  PONV Risk Score and Plan: 4 or greater and Ondansetron, Dexamethasone, Treatment may vary due to age or medical condition, TIVA and Propofol infusion  Airway Management Planned: Natural Airway  Additional Equipment: None  Intra-op Plan:   Post-operative Plan: Extubation in OR  Informed Consent: I have reviewed the patients History and Physical, chart, labs and discussed the procedure including the  risks, benefits and alternatives for the proposed anesthesia with the patient or authorized representative who has indicated his/her understanding and acceptance.     Dental advisory given  Plan Discussed with: CRNA  Anesthesia Plan Comments: (Discussed risks of anesthesia with patient, including possibility of difficulty with spontaneous ventilation under anesthesia necessitating airway intervention, PONV, and rare risks such as cardiac or respiratory or neurological events, and allergic reactions. Discussed the role of CRNA in patient's perioperative care. Patient understands.)        Anesthesia Quick Evaluation

## 2022-12-28 NOTE — H&P (Signed)
Urology H&P  Urologic history: 1. Left UPJ obstruction Chronic left UPJ obstruction, however admitted with pyelonephritis/pyonephrosis with sepsis November 2022 and underwent stent placement.  Elected a chronic indwelling stent.  History of Present Illness: Heather [name] is a 87 y.o. female with chronic left UPJ obstruction and history of pyonephrosis with sepsis managed with an indwelling ureteral stent.  She presents today for stent exchange.  Preop urine culture grew Aerococcus consistent with colonization and she was treated with amoxicillin.  Past Medical History:  Diagnosis Date   Anginal pain (HCC)    Brain aneurysm    Cancer [facility]) lung   2016   Carotid artery stenosis    Carotid stenosis 06/19/2013   Colonic polyp    Emphysema lung (HCC)    GERD (gastroesophageal reflux disease)    Hiatal hernia    Hypercholesteremia    Hyperlipidemia, mixed 06/09/2015   Hypertension    Lung nodule 06/21/2014   Macrocytic 10/06/2014   PAT (paroxysmal atrial tachycardia) (HCC) 06/19/2013   Plantar fasciitis     Past Surgical History:  Procedure Laterality Date   BREAST EXCISIONAL BIOPSY Right 40 yrs ago   neg   BREAST LUMPECTOMY     benign   CATARACT EXTRACTION W/ INTRAOCULAR LENS  IMPLANT, BILATERAL Bilateral    CEREBRAL ANEURYSM REPAIR  2004   CERVIX LESION DESTRUCTION     COLONOSCOPY WITH PROPOFOL N/A 03/31/2015   Procedure: COLONOSCOPY WITH PROPOFOL;  Surgeon: Scot Jun, MD;  Location: [facility] ENDOSCOPY;  Service: Endoscopy;  Laterality: N/A;   CYSTOSCOPY W/ RETROGRADES Left 03/02/2022   Procedure: CYSTOSCOPY WITH RETROGRADE PYELOGRAM;  Surgeon: Riki Altes, MD;  Location: [facility] ORS;  Service: Urology;  Laterality: Left;   CYSTOSCOPY W/ URETERAL STENT PLACEMENT Left 03/03/2021   Procedure: CYSTOSCOPY WITH EXCHANGE;  Surgeon: Riki Altes, MD;  Location: [facility] ORS;  Service: Urology;  Laterality: Left;   CYSTOSCOPY W/ URETERAL STENT PLACEMENT Left 03/02/2022   Procedure:  CYSTOSCOPY WITH STENT EXCHANGE;  Surgeon: Riki Altes, MD;  Location: [facility] ORS;  Service: Urology;  Laterality: Left;   CYSTOSCOPY WITH STENT PLACEMENT Left 11/25/2020   Procedure: CYSTOSCOPY WITH STENT PLACEMENT;  Surgeon: Riki Altes, MD;  Location: [facility] ORS;  Service: Urology;  Laterality: Left;   FLEXIBLE BRONCHOSCOPY N/A 03/10/2017   Procedure: FLEXIBLE BRONCHOSCOPY;  Surgeon: Hulda Marin, MD;  Location: [facility] ORS;  Service: Thoracic;  Laterality: N/A;   THORACOTOMY/LOBECTOMY Left 03/10/2017   Procedure: THORACOTOMY WITH LUNG WEDGE RESECTION POSSIBLE LOBECTOMY;  Surgeon: Hulda Marin, MD;  Location: [facility] ORS;  Service: Thoracic;  Laterality: Left;   TUBAL LIGATION      Home Medications:  Current Meds  Medication Sig   amLODipine (NORVASC) 5 MG tablet Take 5 mg by mouth at bedtime.   amoxicillin (AMOXIL) 875 MG tablet Take 1 tablet (875 mg total) by mouth every 12 (twelve) hours.   atenolol (TENORMIN) 50 MG tablet Take 50 mg by mouth at bedtime.   calcium-vitamin D (OSCAL WITH D) 500-200 MG-UNIT tablet Take 1 tablet by mouth daily with breakfast.   cetirizine (ZYRTEC) 10 MG tablet Take 10 mg by mouth daily.   dextromethorphan-guaiFENesin (MUCINEX DM) 30-600 MG 12hr tablet Take 1 tablet by mouth 2 (two) times daily as needed for cough.   fluticasone (FLONASE) 50 MCG/ACT nasal spray Place 1 spray into the nose daily as needed for allergies.    simvastatin (ZOCOR) 20 MG tablet Take 20 mg by mouth every morning.   vitamin  B-12 (CYANOCOBALAMIN) 100 MCG tablet Take 100 mcg by mouth daily.    Allergies:  Allergies  Allergen Reactions   Ace Inhibitors Swelling   Contrast Media [Iodinated Contrast Media]    Cortisone     Patient had facial swelling and itching from cortisone injection IM    Family History  Problem Relation Age of Onset   Alcohol abuse Mother    Heart attack Father    Breast cancer Neg Hx     Social History:  reports that she quit smoking about 34 years  ago. Her smoking use included cigarettes. She started smoking about 69 years ago. She has a 35 pack-year smoking history. She has never used smokeless tobacco. She reports current alcohol use. She reports that she does not use drugs.  ROS: Noncontributory  Physical Exam:   Constitutional:  Alert and oriented, No acute distress HEENT: [place] AT Respiratory: Normal respiratory effort Psychiatric: Normal mood and affect    Impression/Assessment:  1.  Chronic left UPJ obstruction  Plan:  1.  Left ureteral stent exchange.  The procedure has been discussed in detail and all questions were answered   12/28/2022, 8:05 AM  Irineo Axon,  MD

## 2023-01-14 ENCOUNTER — Other Ambulatory Visit: Payer: Medicare HMO

## 2023-01-24 ENCOUNTER — Inpatient Hospital Stay: Admission: RE | Admit: 2023-01-24 | Payer: Medicare HMO | Source: Ambulatory Visit

## 2023-02-15 ENCOUNTER — Ambulatory Visit: Payer: Medicare Other | Admitting: Physician Assistant

## 2023-02-15 VITALS — BP 190/90 | HR 54 | Ht 62.0 in | Wt 130.0 lb

## 2023-02-15 DIAGNOSIS — R310 Gross hematuria: Secondary | ICD-10-CM | POA: Diagnosis not present

## 2023-02-15 LAB — URINALYSIS, COMPLETE
Bilirubin, UA: NEGATIVE
Glucose, UA: NEGATIVE
Ketones, UA: NEGATIVE
Nitrite, UA: NEGATIVE
Specific Gravity, UA: 1.025 (ref 1.005–1.030)
Urobilinogen, Ur: 0.2 mg/dL (ref 0.2–1.0)
pH, UA: 5.5 (ref 5.0–7.5)

## 2023-02-15 LAB — MICROSCOPIC EXAMINATION: RBC, Urine: >30 /[HPF] — AB (ref 0–2)

## 2023-02-15 NOTE — Progress Notes (Signed)
02/15/2023 11:33 AM   Heather [name] [birth year] [id number]  CC: Chief Complaint  Patient presents with   Hematuria   HPI: Heather [name] is a 88 y.o. female with PMH left UPJ obstruction managed with chronic ureteral stent who presents today for evaluation of gross hematuria.   Today she reports sudden onset gross hematuria 4 days ago in the setting of doing household errands and some heavy lifting.  It has since resolved.  She denies flank pain, dysuria, and fevers.  Most recent stent exchange with Dr. Lonna Cobb was 12/28/2022.  In-office UA today positive for 3+ blood, 3+ protein, and trace leukocytes; urine microscopy with 6-10 WBCs/HPF, >30 RBCs/HPF, and moderate bacteria.  PMH: Past Medical History:  Diagnosis Date   Anginal pain (HCC)    Brain aneurysm    Cancer (HCC) lung   2016   Carotid artery stenosis    Carotid stenosis 06/19/2013   Colonic polyp    Emphysema lung (HCC)    GERD (gastroesophageal reflux disease)    Hiatal hernia    Hypercholesteremia    Hyperlipidemia, mixed 06/09/2015   Hypertension    Lung nodule 06/21/2014   Macrocytic 10/06/2014   PAT (paroxysmal atrial tachycardia) (HCC) 06/19/2013   Plantar fasciitis     Surgical History: Past Surgical History:  Procedure Laterality Date   BREAST EXCISIONAL BIOPSY Right 40 yrs ago   neg   BREAST LUMPECTOMY     benign   CATARACT EXTRACTION W/ INTRAOCULAR LENS  IMPLANT, BILATERAL Bilateral    CEREBRAL ANEURYSM REPAIR  2004   CERVIX LESION DESTRUCTION     COLONOSCOPY WITH PROPOFOL N/A 03/31/2015   Procedure: COLONOSCOPY WITH PROPOFOL;  Surgeon: Scot Jun, MD;  Location: [facility] ENDOSCOPY;  Service: Endoscopy;  Laterality: N/A;   CYSTOSCOPY W/ RETROGRADES Left 03/02/2022   Procedure: CYSTOSCOPY WITH RETROGRADE PYELOGRAM;  Surgeon: Riki Altes, MD;  Location: [facility] ORS;  Service: Urology;  Laterality: Left;   CYSTOSCOPY W/ RETROGRADES Left 12/28/2022   Procedure: CYSTOSCOPY WITH RETROGRADE  PYELOGRAM;  Surgeon: Riki Altes, MD;  Location: [facility] ORS;  Service: Urology;  Laterality: Left;   CYSTOSCOPY W/ URETERAL STENT PLACEMENT Left 03/03/2021   Procedure: CYSTOSCOPY WITH EXCHANGE;  Surgeon: Riki Altes, MD;  Location: [facility] ORS;  Service: Urology;  Laterality: Left;   CYSTOSCOPY W/ URETERAL STENT PLACEMENT Left 03/02/2022   Procedure: CYSTOSCOPY WITH STENT EXCHANGE;  Surgeon: Riki Altes, MD;  Location: [facility] ORS;  Service: Urology;  Laterality: Left;   CYSTOSCOPY W/ URETERAL STENT PLACEMENT Left 12/28/2022   Procedure: CYSTOSCOPY WITH STENT EXCHANGE;  Surgeon: Riki Altes, MD;  Location: [facility] ORS;  Service: Urology;  Laterality: Left;   CYSTOSCOPY WITH STENT PLACEMENT Left 11/25/2020   Procedure: CYSTOSCOPY WITH STENT PLACEMENT;  Surgeon: Riki Altes, MD;  Location: [facility] ORS;  Service: Urology;  Laterality: Left;   FLEXIBLE BRONCHOSCOPY N/A 03/10/2017   Procedure: FLEXIBLE BRONCHOSCOPY;  Surgeon: Hulda Marin, MD;  Location: [facility] ORS;  Service: Thoracic;  Laterality: N/A;   THORACOTOMY/LOBECTOMY Left 03/10/2017   Procedure: THORACOTOMY WITH LUNG WEDGE RESECTION POSSIBLE LOBECTOMY;  Surgeon: Hulda Marin, MD;  Location: [facility] ORS;  Service: Thoracic;  Laterality: Left;   TUBAL LIGATION      Home Medications:  Allergies as of 02/15/2023       Reactions   Ace Inhibitors Swelling   Contrast Media [iodinated Contrast Media]    Cortisone    Patient had facial swelling and itching from cortisone injection IM  Medication List        Accurate as of February 15, 2023 11:33 AM. If you have any questions, ask your nurse or doctor.          amLODipine 5 MG tablet Commonly known as: NORVASC Take 5 mg by mouth at bedtime.   amoxicillin 875 MG tablet Commonly known as: AMOXIL Take 875 mg by mouth as needed. What changed: Another medication with the same name was removed. Continue taking this medication, and follow the directions you see here.    atenolol 50 MG tablet Commonly known as: TENORMIN Take 50 mg by mouth at bedtime.   calcium-vitamin D 500-200 MG-UNIT tablet Commonly known as: OSCAL WITH D Take 1 tablet by mouth daily with breakfast.   cetirizine 10 MG tablet Commonly known as: ZYRTEC Take 10 mg by mouth daily.   cyanocobalamin 100 MCG tablet Commonly known as: VITAMIN B12 Take 100 mcg by mouth daily.   dextromethorphan-guaiFENesin 30-600 MG 12hr tablet Commonly known as: MUCINEX DM Take 1 tablet by mouth 2 (two) times daily as needed for cough.   donepezil 10 MG tablet Commonly known as: ARICEPT Take 1 tablet by mouth at bedtime.   fluticasone 50 MCG/ACT nasal spray Commonly known as: FLONASE Place 1 spray into the nose daily as needed for allergies.   simvastatin 20 MG tablet Commonly known as: ZOCOR Take 20 mg by mouth every morning.        Allergies:  Allergies  Allergen Reactions   Ace Inhibitors Swelling   Contrast Media [Iodinated Contrast Media]    Cortisone     Patient had facial swelling and itching from cortisone injection IM    Family History: Family History  Problem Relation Age of Onset   Alcohol abuse Mother    Heart attack Father    Breast cancer Neg Hx     Social History:   reports that she quit smoking about 34 years ago. Her smoking use included cigarettes. She started smoking about 69 years ago. She has a 35 pack-year smoking history. She has never used smokeless tobacco. She reports current alcohol use. She reports that she does not use drugs.  Physical Exam: BP (!) 190/90   Pulse (!) 54   Ht 5\' 2"  (1.575 m)   Wt 130 lb (59 kg)   BMI 23.78 kg/m   Constitutional:  Alert and oriented, no acute distress, nontoxic appearing HEENT: , AT Cardiovascular: No clubbing, cyanosis, or edema Respiratory: Normal respiratory effort, no increased work of breathing Skin: No rashes, bruises or suspicious lesions Neurologic: Grossly intact, no focal deficits, moving all 4  extremities Psychiatric: Normal mood and affect  Laboratory Data: Results for orders placed or performed in visit on 02/15/23  Microscopic Examination   Collection Time: 02/15/23 11:10 AM   Urine  Result Value Ref Range   WBC, UA 6-10 (A) 0 - 5 /hpf   RBC, Urine >30 (A) 0 - 2 /hpf   Epithelial Cells (non renal) 0-10 0 - 10 /hpf   Casts Present (A) None seen /lpf   Cast Type Hyaline casts N/A   Mucus, UA Present (A) Not Estab.   Bacteria, UA Moderate (A) None seen/Few  Urinalysis, Complete   Collection Time: 02/15/23 11:10 AM  Result Value Ref Range   Specific Gravity, UA 1.025 1.005 - 1.030   pH, UA 5.5 5.0 - 7.5   Color, UA Amber (A) Yellow   Appearance Ur Cloudy (A) Clear   Leukocytes,UA Trace (A) Negative  Protein,UA 3+ (A) Negative/Trace   Glucose, UA Negative Negative   Ketones, UA Negative Negative   RBC, UA 3+ (A) Negative   Bilirubin, UA Negative Negative   Urobilinogen, Ur 0.2 0.2 - 1.0 mg/dL   Nitrite, UA Negative Negative   Microscopic Examination See below:    Assessment & Plan:   1. Gross hematuria (Primary) I provided with assurance that gross hematuria is common with a ureteral stent and it may come and go and especially be exacerbated by increased physical activity.  She is not clinically infected today, though will send urine for culture in case she develops symptoms.  We discussed warning signs of clinically significant hematuria including passage of thick, ketchup like urine, large clots, and the sudden inability to void.  Barring these, we discussed staying hydrated especially in times of gross hematuria to keep things dilute.  I also offered her CBC today, which she declined, which is reasonable. - Urinalysis, Complete - CULTURE, URINE COMPREHENSIVE   Return if symptoms worsen or fail to improve.  Carman Ching, PA-C  [facility] [place] [address] [place], [place] [address] [phone]

## 2023-02-20 LAB — CULTURE, URINE COMPREHENSIVE

## 2023-03-18 ENCOUNTER — Ambulatory Visit
Admission: RE | Admit: 2023-03-18 | Discharge: 2023-03-18 | Disposition: A | Discharge status: Home / Self Care | Payer: Medicare Other | Source: Ambulatory Visit | Attending: Oncology | Admitting: Oncology

## 2023-03-18 DIAGNOSIS — C3432 Malignant neoplasm of lower lobe, left bronchus or lung: Secondary | ICD-10-CM | POA: Insufficient documentation

## 2023-03-21 ENCOUNTER — Other Ambulatory Visit: Payer: Self-pay

## 2023-03-21 DIAGNOSIS — C3411 Malignant neoplasm of upper lobe, right bronchus or lung: Secondary | ICD-10-CM

## 2023-03-21 DIAGNOSIS — C3432 Malignant neoplasm of lower lobe, left bronchus or lung: Secondary | ICD-10-CM

## 2023-03-22 ENCOUNTER — Inpatient Hospital Stay: Payer: Medicare Other | Attending: Oncology | Admitting: Oncology

## 2023-03-22 ENCOUNTER — Inpatient Hospital Stay: Payer: Medicare Other

## 2023-03-22 ENCOUNTER — Encounter: Payer: Self-pay | Admitting: Oncology

## 2023-03-22 VITALS — BP 165/59 | HR 58 | Temp 97.7°F | Wt 128.1 lb

## 2023-03-22 DIAGNOSIS — C3432 Malignant neoplasm of lower lobe, left bronchus or lung: Secondary | ICD-10-CM

## 2023-03-22 DIAGNOSIS — C3411 Malignant neoplasm of upper lobe, right bronchus or lung: Secondary | ICD-10-CM

## 2023-03-22 DIAGNOSIS — D751 Secondary polycythemia: Secondary | ICD-10-CM | POA: Diagnosis not present

## 2023-03-22 LAB — CBC WITH DIFFERENTIAL (CANCER CENTER ONLY)
Abs Immature Granulocytes: 0.03 10*3/uL (ref 0.00–0.07)
Basophils Absolute: 0 10*3/uL (ref 0.0–0.1)
Basophils Relative: 1 %
Eosinophils Absolute: 0.1 10*3/uL (ref 0.0–0.5)
Eosinophils Relative: 1 %
HCT: 46.8 % — ABNORMAL HIGH (ref 36.0–46.0)
Hemoglobin: 15.6 g/dL — ABNORMAL HIGH (ref 12.0–15.0)
Immature Granulocytes: 0 %
Lymphocytes Relative: 26 %
Lymphs Abs: 2.1 10*3/uL (ref 0.7–4.0)
MCH: 33 pg (ref 26.0–34.0)
MCHC: 33.3 g/dL (ref 30.0–36.0)
MCV: 98.9 fL (ref 80.0–100.0)
Monocytes Absolute: 0.6 10*3/uL (ref 0.1–1.0)
Monocytes Relative: 7 %
Neutro Abs: 5.4 10*3/uL (ref 1.7–7.7)
Neutrophils Relative %: 65 %
Platelet Count: 224 10*3/uL (ref 150–400)
RBC: 4.73 MIL/uL (ref 3.87–5.11)
RDW: 11.9 % (ref 11.5–15.5)
WBC Count: 8.2 10*3/uL (ref 4.0–10.5)
nRBC: 0 % (ref 0.0–0.2)

## 2023-03-22 LAB — CMP (CANCER CENTER ONLY)
ALT: 17 U/L (ref 0–44)
AST: 22 U/L (ref 15–41)
Albumin: 4 g/dL (ref 3.5–5.0)
Alkaline Phosphatase: 59 U/L (ref 38–126)
Anion gap: 10 (ref 5–15)
BUN: 21 mg/dL (ref 8–23)
CO2: 26 mmol/L (ref 22–32)
Calcium: 9.5 mg/dL (ref 8.9–10.3)
Chloride: 104 mmol/L (ref 98–111)
Creatinine: 0.93 mg/dL (ref 0.44–1.00)
GFR, Estimated: >60 mL/min (ref >60)
Glucose, Bld: 87 mg/dL (ref 70–99)
Potassium: 4.6 mmol/L (ref 3.5–5.1)
Sodium: 140 mmol/L (ref 135–145)
Total Bilirubin: 0.7 mg/dL (ref 0.0–1.2)
Total Protein: 7.1 g/dL (ref 6.5–8.1)

## 2023-03-22 NOTE — Assessment & Plan Note (Signed)
Same plan as above.  

## 2023-03-22 NOTE — Progress Notes (Unsigned)
 Hematology/Oncology Progress note [phone] Fax:([phone]     Chief Complaint:  Follow up for lung cancer   ASSESSMENT & PLAN:  Heather [name] is a 88 y.o. 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 Pathology revealed a 1.1 cm high-grade adenocarcinoma with pleural invasion. Nodes were negative. Pathologic stage was T2aN1M0 (stage IB).  She is s/p wedge resection of a left lower lobe mass on 03/10/2017.  Pathology revealed an 8 mm invasive adenocarcinoma with solid and acinar patterns.  There was no visceral invasion.  There was no lymphovascular invasion.  All margins were negative.  Pathologic stage was T1aNx (stage IA).   Malignant neoplasm of lower lobe of left lung (HCC) #History of stage Ib right lung upper lobe adenocarcinoma status post wedge resection in 2016 History of left lower lobe adenocarcinoma T1 a NX status post wedge resection 2019. Clinically patient is doing very well. She has been 5 years post her lung cancer surgery. 03/18/2023 Mildly enlarged pre tracheal lymph node is slightly increased, Stable bilateral areas of lung nodularity and postsurgical changes.  Obtain PET scan   Erythrocytosis JAK2 V617F mutation negative, with reflex to other mutations CALR, MPL, JAK 2 Ex 12-15 mutations negative.Negative BCR ABL. Most likely this is secondary erythrocytosis.  I suspect that this may be secondary to hypoxia due to underlying chronic lung diseases/emphysema or sleep apnea.  No need for phlebotomy given that hematocrit is less than 52. She declines sleep apnea work up  Primary cancer of right upper lobe of lung [facility]) Same plan as above.   No orders of the defined types were placed in this encounter.  RTC after CT scan in 12 months for MD assessment and labs (CBC with diff, CMP). All questions were answered. The patient knows to call the clinic with any problems, questions or concerns.  Rickard Patience, MD, PhD [facility] Hematology Oncology 03/22/2023   PERTINENT ONCOLOGY HISTORY Previously follows up with Dr.Corcoran. [facility] care with me on 03/15/2018  PET scan on 02/13/2014 revealed a 1 cm right upper lobe hypermetabolic nodule (SUV 3.8) and a 6 mm right upper lobe nodule (no metabolic activity). There was no mediastianl adenopathy.  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 Pathology revealed a 1.1 cm high-grade adenocarcinoma with pleural invasion. Nodes were negative. Pathologic stage was T2aN1M0 (stage IB).  Chest CT on 02/10/2017 revealed clear interval progression of the posterior left lower lobe pulmonary nodule, now measuring 12 mm in long axis and having a distinctly lobular contour. Imaging features were very concerning for neoplasm.  There was no change in the 7 mm nodule posterior to the right hilum.  PET scan on 02/18/2017 revealed a 11 x 8 mm posterior left lower lobe pulmonary nodule that had mildly progressed from prior studies and demonstrated mild hypermetabolism (SUV 1.6).  The appearance was considered worrisome for an indolent primary bronchogenic neoplasm.  03/10/2017.  s/p wedge resection of a left lower lobe mass.   Pathology revealed an 8 mm invasive adenocarcinoma with solid and acinar patterns.  There was no visceral invasion.  There was no lymphovascular invasion.  All margins were negative.  Pathologic stage was T1aNx (stage IA).   INTERVAL HISTORY Heather [name] is a 88 y.o. female who has above history reviewed by me today presents for follow up visit for management of history of lung cancer, 6 months assessment.  Patient reports feeling well.  She was accompanied by her husband. Denies shortness of breath, fatigue, hemoptysis, unintentional weight loss. She denies any new complaints     Past Medical History:  Diagnosis Date   Anginal pain (HCC)    Brain aneurysm    Cancer [facility]) lung   2016    Carotid artery stenosis    Carotid stenosis 06/19/2013   Colonic polyp    Emphysema lung (HCC)    GERD (gastroesophageal reflux disease)    Hiatal hernia    Hypercholesteremia    Hyperlipidemia, mixed 06/09/2015   Hypertension    Lung nodule 06/21/2014   Macrocytic 10/06/2014   PAT (paroxysmal atrial tachycardia) (HCC) 06/19/2013   Plantar fasciitis     Past Surgical History:  Procedure Laterality Date   BREAST EXCISIONAL BIOPSY Right 40 yrs ago   neg   BREAST LUMPECTOMY     benign   CATARACT EXTRACTION W/ INTRAOCULAR LENS  IMPLANT, BILATERAL Bilateral    CEREBRAL ANEURYSM REPAIR  2004   CERVIX LESION DESTRUCTION     COLONOSCOPY WITH PROPOFOL N/A 03/31/2015   Procedure: COLONOSCOPY WITH PROPOFOL;  Surgeon: Scot Jun, MD;  Location: [facility] ENDOSCOPY;  Service: Endoscopy;  Laterality: N/A;   CYSTOSCOPY W/ RETROGRADES Left 03/02/2022   Procedure: CYSTOSCOPY WITH RETROGRADE PYELOGRAM;  Surgeon: Riki Altes, MD;  Location: [facility] ORS;  Service: Urology;  Laterality: Left;   CYSTOSCOPY W/ RETROGRADES Left 12/28/2022   Procedure: CYSTOSCOPY WITH RETROGRADE PYELOGRAM;  Surgeon: Riki Altes, MD;  Location: [facility] ORS;  Service: Urology;  Laterality: Left;   CYSTOSCOPY W/ URETERAL STENT PLACEMENT Left 03/03/2021   Procedure: CYSTOSCOPY WITH EXCHANGE;  Surgeon: Riki Altes, MD;  Location: [facility] ORS;  Service: Urology;  Laterality: Left;   CYSTOSCOPY W/ URETERAL STENT PLACEMENT Left 03/02/2022   Procedure: CYSTOSCOPY WITH STENT EXCHANGE;  Surgeon: Riki Altes, MD;  Location: [facility] ORS;  Service: Urology;  Laterality: Left;   CYSTOSCOPY W/ URETERAL STENT PLACEMENT Left 12/28/2022   Procedure: CYSTOSCOPY WITH STENT EXCHANGE;  Surgeon: Riki Altes, MD;  Location: [facility] ORS;  Service: Urology;  Laterality: Left;   CYSTOSCOPY WITH STENT PLACEMENT Left 11/25/2020   Procedure: CYSTOSCOPY WITH STENT PLACEMENT;  Surgeon: Riki Altes, MD;  Location: [facility] ORS;  Service: Urology;   Laterality: Left;   FLEXIBLE BRONCHOSCOPY N/A 03/10/2017   Procedure: FLEXIBLE BRONCHOSCOPY;  Surgeon: Hulda Marin, MD;  Location: [facility] ORS;  Service: Thoracic;  Laterality: N/A;   THORACOTOMY/LOBECTOMY Left 03/10/2017   Procedure: THORACOTOMY WITH LUNG WEDGE RESECTION POSSIBLE LOBECTOMY;  Surgeon: Hulda Marin, MD;  Location: [facility] ORS;  Service: Thoracic;  Laterality: Left;   TUBAL LIGATION      Family History  Problem Relation Age of Onset   Alcohol abuse Mother    Heart attack Father    Breast cancer Neg Hx     Social History:  reports that she quit smoking about 34 years ago. Her smoking use included cigarettes. She started smoking about 69 years ago. She has a 35 pack-year smoking history. She has never used smokeless tobacco. She reports current alcohol use. She reports that she does not use drugs.   Allergies:  Allergies  Allergen Reactions   Ace Inhibitors Swelling   Contrast Media [Iodinated Contrast Media]    Cortisone     Patient had facial swelling and itching from cortisone injection IM    Current Medications: Current Outpatient Medications  Medication Sig Dispense Refill   amLODipine (NORVASC) 5 MG tablet Take 5 mg by mouth  at bedtime.     amoxicillin (AMOXIL) 875 MG tablet Take 875 mg by mouth as needed.     atenolol (TENORMIN) 50 MG tablet Take 50 mg by mouth at bedtime.     calcium-vitamin D (OSCAL WITH D) 500-200 MG-UNIT tablet Take 1 tablet by mouth daily with breakfast.     cetirizine (ZYRTEC) 10 MG tablet Take 10 mg by mouth daily.     dextromethorphan-guaiFENesin (MUCINEX DM) 30-600 MG 12hr tablet Take 1 tablet by mouth 2 (two) times daily as needed for cough.     donepezil (ARICEPT) 10 MG tablet Take 1 tablet by mouth at bedtime.     fluticasone (FLONASE) 50 MCG/ACT nasal spray Place 1 spray into the nose daily as needed for allergies.      simvastatin (ZOCOR) 20 MG tablet Take 20 mg by mouth every morning.     vitamin B-12 (CYANOCOBALAMIN) 100 MCG tablet  Take 100 mcg by mouth daily.     No current facility-administered medications for this visit.   Review of Systems  Constitutional:  Negative for appetite change, chills, fatigue and fever.  HENT:   Positive for hearing loss. Negative for voice change.   Eyes:  Negative for eye problems.  Respiratory:  Negative for chest tightness and cough.   Cardiovascular:  Negative for chest pain.  Gastrointestinal:  Negative for abdominal distention, abdominal pain and blood in stool.  Endocrine: Negative for hot flashes.  Genitourinary:  Negative for difficulty urinating and frequency.   Musculoskeletal:  Negative for arthralgias.       Chronic intermittent back pain  Skin:  Negative for itching and rash.  Neurological:  Negative for extremity weakness.  Hematological:  Negative for adenopathy.  Psychiatric/Behavioral:  Negative for confusion.     Performance status (ECOG): 0 - Asymptomatic  Vital Signs BP (!) 165/59 (BP Location: Left Arm, Patient Position: Sitting, Cuff Size: Normal)   Pulse (!) 58   Temp 97.7 F (36.5 C) (Tympanic)   Wt 128 lb 1.6 oz (58.1 kg)   SpO2 96%   BMI 23.43 kg/m   Physical Exam Constitutional:      General: She is not in acute distress.    Appearance: She is not diaphoretic.  HENT:     Head: Normocephalic and atraumatic.     Mouth/Throat:     Pharynx: No oropharyngeal exudate.  Eyes:     General: No scleral icterus.    Pupils: Pupils are equal, round, and reactive to light.  Cardiovascular:     Rate and Rhythm: Normal rate and regular rhythm.     Heart sounds: No murmur heard. Pulmonary:     Effort: Pulmonary effort is normal. No respiratory distress.  Abdominal:     General: There is no distension.     Palpations: Abdomen is soft.     Tenderness: There is no abdominal tenderness.  Musculoskeletal:        General: Normal range of motion.     Cervical back: Normal range of motion and neck supple.  Skin:    General: Skin is warm and dry.      Findings: No erythema.  Neurological:     Mental Status: She is alert and oriented to person, place, and time.  Psychiatric:        Mood and Affect: Affect normal.     RADIOGRAPHIC STUDIES: I have personally reviewed the radiological images as listed and agreed with the findings in the report. DG OR UROLOGY CYSTO IMAGE ([facility] ONLY)  Result Date: 12/28/2022 There is no interpretation for this exam.  This order is for images obtained during a surgical procedure.  Please See "Surgeries" Tab for more information regarding the procedure.    Laboratory results were reviewed by me    Latest Ref Rng & Units 03/22/2023   12:31 PM 03/18/2022   10:02 AM 02/25/2022   10:49 AM  CBC  WBC 4.0 - 10.5 K/uL 8.2  6.8  6.0   Hemoglobin 12.0 - 15.0 g/dL 81.1  91.4  78.2   Hematocrit 36.0 - 46.0 % 46.8  45.5  43.8   Platelets 150 - 400 K/uL 224  153  203       Latest Ref Rng & Units 03/22/2023   12:30 PM 03/18/2022   10:02 AM 02/25/2022   10:49 AM  CMP  Glucose 70 - 99 mg/dL 87  956  88   BUN 8 - 23 mg/dL 21  23  29    Creatinine 0.44 - 1.00 mg/dL 2.13  0.86  5.78   Sodium 135 - 145 mmol/L 140  141  140   Potassium 3.5 - 5.1 mmol/L 4.6  4.4  3.9   Chloride 98 - 111 mmol/L 104  105  106   CO2 22 - 32 mmol/L 26  28  27    Calcium 8.9 - 10.3 mg/dL 9.5  9.7  9.2   Total Protein 6.5 - 8.1 g/dL 7.1  7.2    Total Bilirubin 0.0 - 1.2 mg/dL 0.7  0.8    Alkaline Phos 38 - 126 U/L 59  54    AST 15 - 41 U/L 22  24    ALT 0 - 44 U/L 17  20

## 2023-03-22 NOTE — Assessment & Plan Note (Addendum)
#  History of stage Ib right lung upper lobe adenocarcinoma status post wedge resection in 2016 History of left lower lobe adenocarcinoma T1 a NX status post wedge resection 2019. Clinically patient is doing very well. She has been 5 years post her lung cancer surgery. 03/18/2023 Mildly enlarged pre tracheal lymph node is slightly increased, Stable bilateral areas of lung nodularity and postsurgical changes.  Obtain PET scan

## 2023-03-22 NOTE — Assessment & Plan Note (Addendum)
 JAK2 V617F mutation negative, with reflex to other mutations CALR, MPL, JAK 2 Ex 12-15 mutations negative. Negative BCR ABL. Most likely this is secondary erythrocytosis.  I suspect that this may be secondary to hypoxia due to underlying chronic lung diseases/emphysema or sleep apnea.  No need for phlebotomy given that hematocrit is less than 52. She declines sleep apnea work up

## 2023-03-23 ENCOUNTER — Telehealth: Payer: Self-pay

## 2023-03-23 DIAGNOSIS — C3411 Malignant neoplasm of upper lobe, right bronchus or lung: Secondary | ICD-10-CM

## 2023-03-23 DIAGNOSIS — R9389 Abnormal findings on diagnostic imaging of other specified body structures: Secondary | ICD-10-CM

## 2023-03-23 NOTE — Telephone Encounter (Signed)
 Pt informed of results and the need for PET.Heather [name]   Please schedule PET (first available) and inform pt of appt details.

## 2023-03-30 ENCOUNTER — Ambulatory Visit
Admission: RE | Admit: 2023-03-30 | Discharge: 2023-03-30 | Disposition: A | Discharge status: Home / Self Care | Payer: Medicare Other | Source: Ambulatory Visit | Attending: Oncology | Admitting: Oncology

## 2023-03-30 DIAGNOSIS — C3411 Malignant neoplasm of upper lobe, right bronchus or lung: Secondary | ICD-10-CM | POA: Diagnosis present

## 2023-03-30 DIAGNOSIS — N1330 Unspecified hydronephrosis: Secondary | ICD-10-CM | POA: Insufficient documentation

## 2023-03-30 DIAGNOSIS — R9389 Abnormal findings on diagnostic imaging of other specified body structures: Secondary | ICD-10-CM | POA: Diagnosis not present

## 2023-03-30 LAB — GLUCOSE, CAPILLARY: Glucose-Capillary: 85 mg/dL (ref 70–99)

## 2023-03-30 MED ORDER — FLUDEOXYGLUCOSE F - 18 (FDG) INJECTION
7.1200 | Freq: Once | INTRAVENOUS | Status: AC | PRN
  Administered 2023-03-30: 7.12 via INTRAVENOUS

## 2023-04-07 ENCOUNTER — Telehealth: Payer: Self-pay

## 2023-04-07 DIAGNOSIS — R948 Abnormal results of function studies of other organs and systems: Secondary | ICD-10-CM

## 2023-04-07 NOTE — Telephone Encounter (Signed)
-----   Message from Rickard Patience sent at 04/06/2023 11:48 PM EDT ----- Please let patient know that PET scan showed increase metabolic activity of lymph node in her chest. I recommend her to see pulmonology for evaluation of biopsy. Refer to pulmonology.  Follow up TBD list, please revisit her chart in a few weeks.  I plan to see her back 1 week after bronchoscopy biopsy if any.  Thanks.   Janyth Contes

## 2023-04-07 NOTE — Telephone Encounter (Signed)
 Called and spoke to pt and husband, informed them of MD recommendation. Pt requestong urgent referral to pulmonology. Referral placed and also provided pt with Pulmonology number so she can contact them if she doesn't hear back within the next few days.

## 2023-04-08 NOTE — Telephone Encounter (Signed)
 Spoke to pt, questions and concerns answered

## 2023-04-08 NOTE — Telephone Encounter (Signed)
 Pt left a vm to be called back. I called pt and the spouse answered. He stated that they were called yesterday about results and they do not understand why they are being sent to pulmonology. They want to know if there is cancer or not. He stated they are in the mid 80s and are getting worn out with appts.  I sent a secure chat to RN and she said that she will call back when clinic is over.   I told pt spouse this and he said okay if that is the best we can do then we will take it.

## 2023-04-13 ENCOUNTER — Ambulatory Visit: Admitting: Student in an Organized Health Care Education/Training Program

## 2023-04-13 ENCOUNTER — Telehealth: Payer: Self-pay

## 2023-04-13 ENCOUNTER — Encounter: Payer: Self-pay | Admitting: Student in an Organized Health Care Education/Training Program

## 2023-04-13 VITALS — BP 136/76 | HR 54 | Temp 98.0°F | Ht 62.0 in | Wt 127.4 lb

## 2023-04-13 DIAGNOSIS — R911 Solitary pulmonary nodule: Secondary | ICD-10-CM

## 2023-04-13 DIAGNOSIS — R591 Generalized enlarged lymph nodes: Secondary | ICD-10-CM

## 2023-04-13 DIAGNOSIS — Z87891 Personal history of nicotine dependence: Secondary | ICD-10-CM | POA: Diagnosis not present

## 2023-04-13 NOTE — Progress Notes (Signed)
 Synopsis: Referred in for mediastinal lymphadenopathy by Rickard Patience, MD  Assessment & Plan:   #Lymphadenopathy (Primary)  Presents for the evaluation of an enlarged paratracheal lymph node that is enlarging on sequential CT's and FDG avid on PET/CT. I have personally reviewed her chest CT's dating back to 2016, and noted the lymph node to be enlarging over the last few CT's. I also noted the previous two nodules that were managed with wedge resection. I also noted a ground glass opacity in the RLL that has been stable for at least 4-5 years and represents benign findings and does not warrant a biopsy at this point.  We discussed the importance of diagnosis and staging in lung malignancies, and the approach to obtaining a tissue diagnosis which would include endobronchial ultrasound guided sampling.  We also discussed the risks associated with the procedure which include a <2 % risk of pneumothorax, infection, bleeding, and nondiagnostic procedure.  I explained that patients typically are able to return home the same day of the procedure, but in rare cases admission to the hospital for observation and treatment is required.  After our discussion, the patient elected to proceed with the procedure  Recommendations:  - Procedural/ Surgical Case Request: ENDOBRONCHIAL ULTRASOUND (EBUS); Future   No follow-ups on file.  I spent 60 minutes caring for this patient today, including preparing to see the patient, obtaining a medical history , reviewing a separately obtained history, performing a medically appropriate examination and/or evaluation, counseling and educating the patient/family/caregiver, ordering medications, tests, or procedures, documenting clinical information in the electronic health record, and independently interpreting results (not separately reported/billed) and communicating results to the patient/family/caregiver  Raechel Chute, MD [facility]  End of visit  medications:  No orders of the defined types were placed in this encounter.    Current Outpatient Medications:    amLODipine (NORVASC) 5 MG tablet, Take 5 mg by mouth at bedtime., Disp: , Rfl:    amoxicillin (AMOXIL) 875 MG tablet, Take 875 mg by mouth as needed., Disp: , Rfl:    atenolol (TENORMIN) 50 MG tablet, Take 50 mg by mouth at bedtime., Disp: , Rfl:    calcium-vitamin D (OSCAL WITH D) 500-200 MG-UNIT tablet, Take 1 tablet by mouth daily with breakfast., Disp: , Rfl:    cetirizine (ZYRTEC) 10 MG tablet, Take 10 mg by mouth daily., Disp: , Rfl:    dextromethorphan-guaiFENesin (MUCINEX DM) 30-600 MG 12hr tablet, Take 1 tablet by mouth 2 (two) times daily as needed for cough., Disp: , Rfl:    donepezil (ARICEPT) 10 MG tablet, Take 1 tablet by mouth at bedtime., Disp: , Rfl:    fluticasone (FLONASE) 50 MCG/ACT nasal spray, Place 1 spray into the nose daily as needed for allergies. , Disp: , Rfl:    simvastatin (ZOCOR) 20 MG tablet, Take 20 mg by mouth every morning., Disp: , Rfl:    vitamin B-12 (CYANOCOBALAMIN) 100 MCG tablet, Take 100 mcg by mouth daily., Disp: , Rfl:    Subjective:   PATIENT ID: Heather [name]: female DOB: Feb 03, 1935, MRN: [id number]  Chief Complaint  Patient presents with   Consult    No cough, shortness of breath or wheezing.     HPI  Patient is a pleasant 88 year old female with a past medical history of stage I lung cancer (2 separate tumors managed with wedge resection) who is presenting today for the evaluation of lymphadenopathy.  Patient is in her usual state of health and has  no respiratory symptoms.  She is active without any limitation.  She denies any shortness of breath, cough, chest pain, wheeze, sputum production, or hemoptysis. She has not had any weight changes.  She follows with Dr. Cathie Hoops from oncology with periodic radiographic surveillance which showed enlargement of a paratracheal lymph node on her most recent chest CT.  She had a PET/CT  which showed this lymph node to be FDG avid.  She is presenting today to discuss biopsy.  Patient's past medical history reviewed in depth.  She was initially noted to have a right upper lobe pulmonary nodule in 2016 status post wedge resection (03/13/2014) and pathology showing stage Ib poorly differentiated adenocarcinoma.  Patient underwent surveillance imaging and was then noted to have a LLL nodule that showed interval growth. She then underwent wedge biopsy in 2019 with pathology showing adenocarcinoma.  Reports history of smoking, with 35 pack years and quit in 1990.  Ancillary information including prior medications, full medical/surgical/family/social histories, and PFTs (when available) are listed below and have been reviewed.   Review of Systems  Constitutional:  Negative for chills and fever.  Respiratory:  Negative for cough, hemoptysis, sputum production, shortness of breath and wheezing.   Cardiovascular:  Negative for chest pain.     Objective:   Vitals:   04/13/23 1040  BP: 136/76  Pulse: (!) 54  Temp: 98 F (36.7 C)  TempSrc: Temporal  SpO2: 95%  Weight: 127 lb 6.4 oz (57.8 kg)  Height: 5\' 2"  (1.575 m)   95% on RA BMI Readings from Last 3 Encounters:  04/13/23 23.30 kg/m  03/22/23 23.43 kg/m  02/15/23 23.78 kg/m   Wt Readings from Last 3 Encounters:  04/13/23 127 lb 6.4 oz (57.8 kg)  03/22/23 128 lb 1.6 oz (58.1 kg)  02/15/23 130 lb (59 kg)    Physical Exam    Ancillary Information    Past Medical History:  Diagnosis Date   Anginal pain (HCC)    Brain aneurysm    Cancer (HCC) lung   2016   Carotid artery stenosis    Carotid stenosis 06/19/2013   Colonic polyp    Emphysema lung (HCC)    GERD (gastroesophageal reflux disease)    Hiatal hernia    Hypercholesteremia    Hyperlipidemia, mixed 06/09/2015   Hypertension    Lung nodule 06/21/2014   Macrocytic 10/06/2014   PAT (paroxysmal atrial tachycardia) (HCC) 06/19/2013   Plantar  fasciitis      Family History  Problem Relation Age of Onset   Alcohol abuse Mother    Heart attack Father    Breast cancer Neg Hx      Past Surgical History:  Procedure Laterality Date   BREAST EXCISIONAL BIOPSY Right 40 yrs ago   neg   BREAST LUMPECTOMY     benign   CATARACT EXTRACTION W/ INTRAOCULAR LENS  IMPLANT, BILATERAL Bilateral    CEREBRAL ANEURYSM REPAIR  2004   CERVIX LESION DESTRUCTION     COLONOSCOPY WITH PROPOFOL N/A 03/31/2015   Procedure: COLONOSCOPY WITH PROPOFOL;  Surgeon: Scot Jun, MD;  Location: [facility] ENDOSCOPY;  Service: Endoscopy;  Laterality: N/A;   CYSTOSCOPY W/ RETROGRADES Left 03/02/2022   Procedure: CYSTOSCOPY WITH RETROGRADE PYELOGRAM;  Surgeon: Riki Altes, MD;  Location: [facility] ORS;  Service: Urology;  Laterality: Left;   CYSTOSCOPY W/ RETROGRADES Left 12/28/2022   Procedure: CYSTOSCOPY WITH RETROGRADE PYELOGRAM;  Surgeon: Riki Altes, MD;  Location: [facility] ORS;  Service: Urology;  Laterality: Left;  CYSTOSCOPY W/ URETERAL STENT PLACEMENT Left 03/03/2021   Procedure: CYSTOSCOPY WITH EXCHANGE;  Surgeon: Riki Altes, MD;  Location: [facility] ORS;  Service: Urology;  Laterality: Left;   CYSTOSCOPY W/ URETERAL STENT PLACEMENT Left 03/02/2022   Procedure: CYSTOSCOPY WITH STENT EXCHANGE;  Surgeon: Riki Altes, MD;  Location: [facility] ORS;  Service: Urology;  Laterality: Left;   CYSTOSCOPY W/ URETERAL STENT PLACEMENT Left 12/28/2022   Procedure: CYSTOSCOPY WITH STENT EXCHANGE;  Surgeon: Riki Altes, MD;  Location: [facility] ORS;  Service: Urology;  Laterality: Left;   CYSTOSCOPY WITH STENT PLACEMENT Left 11/25/2020   Procedure: CYSTOSCOPY WITH STENT PLACEMENT;  Surgeon: Riki Altes, MD;  Location: [facility] ORS;  Service: Urology;  Laterality: Left;   FLEXIBLE BRONCHOSCOPY N/A 03/10/2017   Procedure: FLEXIBLE BRONCHOSCOPY;  Surgeon: Hulda Marin, MD;  Location: [facility] ORS;  Service: Thoracic;  Laterality: N/A;   THORACOTOMY/LOBECTOMY Left 03/10/2017    Procedure: THORACOTOMY WITH LUNG WEDGE RESECTION POSSIBLE LOBECTOMY;  Surgeon: Hulda Marin, MD;  Location: [facility] ORS;  Service: Thoracic;  Laterality: Left;   TUBAL LIGATION      Social History   Socioeconomic History   Marital status: Married    Spouse name: Fayrene Fearing   Number of children: Not on file   Years of education: Not on file   Highest education level: Not on file  Occupational History   Not on file  Tobacco Use   Smoking status: Former    Current packs/day: 0.00    Average packs/day: 1 pack/day for 35.0 years (35.0 ttl pk-yrs)    Types: Cigarettes    Start date: 06/10/1953    Quit date: 06/10/1988    Years since quitting: 34.8   Smokeless tobacco: Never  Vaping Use   Vaping status: Never Used  Substance and Sexual Activity   Alcohol use: Yes    Alcohol/week: 0.0 standard drinks of alcohol    Comment: glass of wine daily   Drug use: No   Sexual activity: Not on file  Other Topics Concern   Not on file  Social History Narrative   Not on file   Social Drivers of Health   Financial Resource Strain: Low Risk  (11/01/2022)   Received from [facility]   Overall Financial Resource Strain (CARDIA)    Difficulty of Paying Living Expenses: Not hard at all  Food Insecurity: No Food Insecurity (11/01/2022)   Received from [facility]   Hunger Vital Sign    Worried About Running Out of Food in the Last Year: Never true    Ran Out of Food in the Last Year: Never true  Transportation Needs: No Transportation Needs (11/01/2022)   Received from [facility] - Transportation    In the past 12 months, has lack of transportation kept you from medical appointments or from getting medications?: No    Lack of Transportation (Non-Medical): No  Physical Activity: Not on file  Stress: Not on file  Social Connections: Not on file  Intimate Partner Violence: Not on file     Allergies  Allergen Reactions   Ace Inhibitors  Swelling   Contrast Media [Iodinated Contrast Media]    Cortisone     Patient had facial swelling and itching from cortisone injection IM     CBC    Component Value Date/Time   WBC 8.2 03/22/2023 1231   WBC 6.0 02/25/2022 1049   RBC 4.73 03/22/2023 1231   HGB 15.6 (H) 03/22/2023 1231  HGB 14.8 02/28/2014 0957   HCT 46.8 (H) 03/22/2023 1231   HCT 43.7 02/28/2014 0957   PLT 224 03/22/2023 1231   PLT 176 02/28/2014 0957   MCV 98.9 03/22/2023 1231   MCV 102 (H) 02/28/2014 0957   MCH 33.0 03/22/2023 1231   MCHC 33.3 03/22/2023 1231   RDW 11.9 03/22/2023 1231   RDW 12.6 02/28/2014 0957   LYMPHSABS 2.1 03/22/2023 1231   LYMPHSABS 1.5 02/28/2014 0957   MONOABS 0.6 03/22/2023 1231   MONOABS 0.4 02/28/2014 0957   EOSABS 0.1 03/22/2023 1231   EOSABS 0.2 02/28/2014 0957   BASOSABS 0.0 03/22/2023 1231   BASOSABS 0.0 02/28/2014 0957    Pulmonary Functions Testing Results:     No data to display          Outpatient Medications Prior to Visit  Medication Sig Dispense Refill   amLODipine (NORVASC) 5 MG tablet Take 5 mg by mouth at bedtime.     amoxicillin (AMOXIL) 875 MG tablet Take 875 mg by mouth as needed.     atenolol (TENORMIN) 50 MG tablet Take 50 mg by mouth at bedtime.     calcium-vitamin D (OSCAL WITH D) 500-200 MG-UNIT tablet Take 1 tablet by mouth daily with breakfast.     cetirizine (ZYRTEC) 10 MG tablet Take 10 mg by mouth daily.     dextromethorphan-guaiFENesin (MUCINEX DM) 30-600 MG 12hr tablet Take 1 tablet by mouth 2 (two) times daily as needed for cough.     donepezil (ARICEPT) 10 MG tablet Take 1 tablet by mouth at bedtime.     fluticasone (FLONASE) 50 MCG/ACT nasal spray Place 1 spray into the nose daily as needed for allergies.      simvastatin (ZOCOR) 20 MG tablet Take 20 mg by mouth every morning.     vitamin B-12 (CYANOCOBALAMIN) 100 MCG tablet Take 100 mcg by mouth daily.     No facility-administered medications prior to visit.

## 2023-04-13 NOTE — Telephone Encounter (Addendum)
 Pre Admit appt- Mon 3/24 between 8a-1p.  Lm x1 for the patient.

## 2023-04-13 NOTE — H&P (View-Only) (Signed)
 Synopsis: Referred in for mediastinal lymphadenopathy by Rickard Patience, MD  Assessment & Plan:   #Lymphadenopathy (Primary)  Presents for the evaluation of an enlarged paratracheal lymph node that is enlarging on sequential CT's and FDG avid on PET/CT. I have personally reviewed her chest CT's dating back to 2016, and noted the lymph node to be enlarging over the last few CT's. I also noted the previous two nodules that were managed with wedge resection. I also noted a ground glass opacity in the RLL that has been stable for at least 4-5 years and represents benign findings and does not warrant a biopsy at this point.  We discussed the importance of diagnosis and staging in lung malignancies, and the approach to obtaining a tissue diagnosis which would include endobronchial ultrasound guided sampling.  We also discussed the risks associated with the procedure which include a <2 % risk of pneumothorax, infection, bleeding, and nondiagnostic procedure.  I explained that patients typically are able to return home the same day of the procedure, but in rare cases admission to the hospital for observation and treatment is required.  After our discussion, the patient elected to proceed with the procedure  Recommendations:  - Procedural/ Surgical Case Request: ENDOBRONCHIAL ULTRASOUND (EBUS); Future   No follow-ups on file.  I spent 60 minutes caring for this patient today, including preparing to see the patient, obtaining a medical history , reviewing a separately obtained history, performing a medically appropriate examination and/or evaluation, counseling and educating the patient/family/caregiver, ordering medications, tests, or procedures, documenting clinical information in the electronic health record, and independently interpreting results (not separately reported/billed) and communicating results to the patient/family/caregiver  Raechel Chute, MD [facility]  End of visit  medications:  No orders of the defined types were placed in this encounter.    Current Outpatient Medications:    amLODipine (NORVASC) 5 MG tablet, Take 5 mg by mouth at bedtime., Disp: , Rfl:    amoxicillin (AMOXIL) 875 MG tablet, Take 875 mg by mouth as needed., Disp: , Rfl:    atenolol (TENORMIN) 50 MG tablet, Take 50 mg by mouth at bedtime., Disp: , Rfl:    calcium-vitamin D (OSCAL WITH D) 500-200 MG-UNIT tablet, Take 1 tablet by mouth daily with breakfast., Disp: , Rfl:    cetirizine (ZYRTEC) 10 MG tablet, Take 10 mg by mouth daily., Disp: , Rfl:    dextromethorphan-guaiFENesin (MUCINEX DM) 30-600 MG 12hr tablet, Take 1 tablet by mouth 2 (two) times daily as needed for cough., Disp: , Rfl:    donepezil (ARICEPT) 10 MG tablet, Take 1 tablet by mouth at bedtime., Disp: , Rfl:    fluticasone (FLONASE) 50 MCG/ACT nasal spray, Place 1 spray into the nose daily as needed for allergies. , Disp: , Rfl:    simvastatin (ZOCOR) 20 MG tablet, Take 20 mg by mouth every morning., Disp: , Rfl:    vitamin B-12 (CYANOCOBALAMIN) 100 MCG tablet, Take 100 mcg by mouth daily., Disp: , Rfl:    Subjective:   PATIENT ID: Heather [name] GENDER: female DOB: Feb 03, 1935, MRN: [id number]  Chief Complaint  Patient presents with   Consult    No cough, shortness of breath or wheezing.     HPI  Patient is a pleasant 88 year old female with a past medical history of stage I lung cancer (2 separate tumors managed with wedge resection) who is presenting today for the evaluation of lymphadenopathy.  Patient is in her usual state of health and has  no respiratory symptoms.  She is active without any limitation.  She denies any shortness of breath, cough, chest pain, wheeze, sputum production, or hemoptysis. She has not had any weight changes.  She follows with Dr. Cathie Hoops from oncology with periodic radiographic surveillance which showed enlargement of a paratracheal lymph node on her most recent chest CT.  She had a PET/CT  which showed this lymph node to be FDG avid.  She is presenting today to discuss biopsy.  Patient's past medical history reviewed in depth.  She was initially noted to have a right upper lobe pulmonary nodule in 2016 status post wedge resection (03/13/2014) and pathology showing stage Ib poorly differentiated adenocarcinoma.  Patient underwent surveillance imaging and was then noted to have a LLL nodule that showed interval growth. She then underwent wedge biopsy in 2019 with pathology showing adenocarcinoma.  Reports history of smoking, with 35 pack years and quit in 1990.  Ancillary information including prior medications, full medical/surgical/family/social histories, and PFTs (when available) are listed below and have been reviewed.   Review of Systems  Constitutional:  Negative for chills and fever.  Respiratory:  Negative for cough, hemoptysis, sputum production, shortness of breath and wheezing.   Cardiovascular:  Negative for chest pain.     Objective:   Vitals:   04/13/23 1040  BP: 136/76  Pulse: (!) 54  Temp: 98 F (36.7 C)  TempSrc: Temporal  SpO2: 95%  Weight: 127 lb 6.4 oz (57.8 kg)  Height: 5\' 2"  (1.575 m)   95% on RA BMI Readings from Last 3 Encounters:  04/13/23 23.30 kg/m  03/22/23 23.43 kg/m  02/15/23 23.78 kg/m   Wt Readings from Last 3 Encounters:  04/13/23 127 lb 6.4 oz (57.8 kg)  03/22/23 128 lb 1.6 oz (58.1 kg)  02/15/23 130 lb (59 kg)    Physical Exam    Ancillary Information    Past Medical History:  Diagnosis Date   Anginal pain (HCC)    Brain aneurysm    Cancer (HCC) lung   2016   Carotid artery stenosis    Carotid stenosis 06/19/2013   Colonic polyp    Emphysema lung (HCC)    GERD (gastroesophageal reflux disease)    Hiatal hernia    Hypercholesteremia    Hyperlipidemia, mixed 06/09/2015   Hypertension    Lung nodule 06/21/2014   Macrocytic 10/06/2014   PAT (paroxysmal atrial tachycardia) (HCC) 06/19/2013   Plantar  fasciitis      Family History  Problem Relation Age of Onset   Alcohol abuse Mother    Heart attack Father    Breast cancer Neg Hx      Past Surgical History:  Procedure Laterality Date   BREAST EXCISIONAL BIOPSY Right 40 yrs ago   neg   BREAST LUMPECTOMY     benign   CATARACT EXTRACTION W/ INTRAOCULAR LENS  IMPLANT, BILATERAL Bilateral    CEREBRAL ANEURYSM REPAIR  2004   CERVIX LESION DESTRUCTION     COLONOSCOPY WITH PROPOFOL N/A 03/31/2015   Procedure: COLONOSCOPY WITH PROPOFOL;  Surgeon: Scot Jun, MD;  Location: [facility] ENDOSCOPY;  Service: Endoscopy;  Laterality: N/A;   CYSTOSCOPY W/ RETROGRADES Left 03/02/2022   Procedure: CYSTOSCOPY WITH RETROGRADE PYELOGRAM;  Surgeon: Riki Altes, MD;  Location: [facility] ORS;  Service: Urology;  Laterality: Left;   CYSTOSCOPY W/ RETROGRADES Left 12/28/2022   Procedure: CYSTOSCOPY WITH RETROGRADE PYELOGRAM;  Surgeon: Riki Altes, MD;  Location: [facility] ORS;  Service: Urology;  Laterality: Left;  CYSTOSCOPY W/ URETERAL STENT PLACEMENT Left 03/03/2021   Procedure: CYSTOSCOPY WITH EXCHANGE;  Surgeon: Riki Altes, MD;  Location: [facility] ORS;  Service: Urology;  Laterality: Left;   CYSTOSCOPY W/ URETERAL STENT PLACEMENT Left 03/02/2022   Procedure: CYSTOSCOPY WITH STENT EXCHANGE;  Surgeon: Riki Altes, MD;  Location: [facility];  Service: Urology;  Laterality: Left;   CYSTOSCOPY W/ URETERAL STENT PLACEMENT Left 12/28/2022   Procedure: CYSTOSCOPY WITH STENT EXCHANGE;  Surgeon: Riki Altes, MD;  Location: [facility];  Service: Urology;  Laterality: Left;   CYSTOSCOPY WITH STENT PLACEMENT Left 11/25/2020   Procedure: CYSTOSCOPY WITH STENT PLACEMENT;  Surgeon: Riki Altes, MD;  Location: [facility];  Service: Urology;  Laterality: Left;   FLEXIBLE BRONCHOSCOPY N/A 03/10/2017   Procedure: FLEXIBLE BRONCHOSCOPY;  Surgeon: Hulda Marin, MD;  Location: [facility] ORS;  Service: Thoracic;  Laterality: N/A;   THORACOTOMY/LOBECTOMY Left 03/10/2017    Procedure: THORACOTOMY WITH LUNG WEDGE RESECTION POSSIBLE LOBECTOMY;  Surgeon: Hulda Marin, MD;  Location: [facility] ORS;  Service: Thoracic;  Laterality: Left;   TUBAL LIGATION      Social History   Socioeconomic History   Marital status: Married    Spouse name: Fayrene Fearing   Number of children: Not on file   Years of education: Not on file   Highest education level: Not on file  Occupational History   Not on file  Tobacco Use   Smoking status: Former    Current packs/day: 0.00    Average packs/day: 1 pack/day for 35.0 years (35.0 ttl pk-yrs)    Types: Cigarettes    Start date: 06/10/1953    Quit date: 06/10/1988    Years since quitting: 34.8   Smokeless tobacco: Never  Vaping Use   Vaping status: Never Used  Substance and Sexual Activity   Alcohol use: Yes    Alcohol/week: 0.0 standard drinks of alcohol    Comment: glass of wine daily   Drug use: No   Sexual activity: Not on file  Other Topics Concern   Not on file  Social History Narrative   Not on file   Social Drivers of Health   Financial Resource Strain: Low Risk  (11/01/2022)   Received from [facility]   Overall Financial Resource Strain (CARDIA)    Difficulty of Paying Living Expenses: Not hard at all  Food Insecurity: No Food Insecurity (11/01/2022)   Received from [facility]   Hunger Vital Sign    Worried About Running Out of Food in the Last Year: Never true    Ran Out of Food in the Last Year: Never true  Transportation Needs: No Transportation Needs (11/01/2022)   Received from [facility]    In the past 12 months, has lack of transportation kept you from medical appointments or from getting medications?: No    Lack of Transportation (Non-Medical): No  Physical Activity: Not on file  Stress: Not on file  Social Connections: Not on file  Intimate Partner Violence: Not on file     Allergies  Allergen Reactions   Ace Inhibitors  Swelling   Contrast Media [Iodinated Contrast Media]    Cortisone     Patient had facial swelling and itching from cortisone injection IM     CBC    Component Value Date/Time   WBC 8.2 03/22/2023 1231   WBC 6.0 02/25/2022 1049   RBC 4.73 03/22/2023 1231   HGB 15.6 (H) 03/22/2023 1231  HGB 14.8 02/28/2014 0957   HCT 46.8 (H) 03/22/2023 1231   HCT 43.7 02/28/2014 0957   PLT 224 03/22/2023 1231   PLT 176 02/28/2014 0957   MCV 98.9 03/22/2023 1231   MCV 102 (H) 02/28/2014 0957   MCH 33.0 03/22/2023 1231   MCHC 33.3 03/22/2023 1231   RDW 11.9 03/22/2023 1231   RDW 12.6 02/28/2014 0957   LYMPHSABS 2.1 03/22/2023 1231   LYMPHSABS 1.5 02/28/2014 0957   MONOABS 0.6 03/22/2023 1231   MONOABS 0.4 02/28/2014 0957   EOSABS 0.1 03/22/2023 1231   EOSABS 0.2 02/28/2014 0957   BASOSABS 0.0 03/22/2023 1231   BASOSABS 0.0 02/28/2014 0957    Pulmonary Functions Testing Results:     No data to display          Outpatient Medications Prior to Visit  Medication Sig Dispense Refill   amLODipine (NORVASC) 5 MG tablet Take 5 mg by mouth at bedtime.     amoxicillin (AMOXIL) 875 MG tablet Take 875 mg by mouth as needed.     atenolol (TENORMIN) 50 MG tablet Take 50 mg by mouth at bedtime.     calcium-vitamin D (OSCAL WITH D) 500-200 MG-UNIT tablet Take 1 tablet by mouth daily with breakfast.     cetirizine (ZYRTEC) 10 MG tablet Take 10 mg by mouth daily.     dextromethorphan-guaiFENesin (MUCINEX DM) 30-600 MG 12hr tablet Take 1 tablet by mouth 2 (two) times daily as needed for cough.     donepezil (ARICEPT) 10 MG tablet Take 1 tablet by mouth at bedtime.     fluticasone (FLONASE) 50 MCG/ACT nasal spray Place 1 spray into the nose daily as needed for allergies.      simvastatin (ZOCOR) 20 MG tablet Take 20 mg by mouth every morning.     vitamin B-12 (CYANOCOBALAMIN) 100 MCG tablet Take 100 mcg by mouth daily.     No facility-administered medications prior to visit.

## 2023-04-13 NOTE — Telephone Encounter (Signed)
 EBUS 04/19/2023 at 8:00am Lung Nodule [address], [id number]  Synetta Fail please see bronch info.

## 2023-04-13 NOTE — Telephone Encounter (Signed)
 I have notified the patient. Nothing further needed.

## 2023-04-14 NOTE — Telephone Encounter (Signed)
 For the codes 95188, [phone] Prior Auth Not Required Refer # [id number] 04/14/2023

## 2023-04-18 ENCOUNTER — Encounter
Admission: RE | Admit: 2023-04-18 | Discharge: 2023-04-18 | Disposition: A | Discharge status: Home / Self Care | Source: Ambulatory Visit | Attending: Student in an Organized Health Care Education/Training Program | Admitting: Student in an Organized Health Care Education/Training Program

## 2023-04-18 ENCOUNTER — Other Ambulatory Visit: Payer: Self-pay

## 2023-04-18 HISTORY — DX: Nontoxic multinodular goiter: E04.2

## 2023-04-18 HISTORY — DX: Essential (primary) hypertension: I10

## 2023-04-18 MED ORDER — CHLORHEXIDINE GLUCONATE 0.12 % MT SOLN
15.0000 mL | Freq: Once | OROMUCOSAL | Status: AC
  Administered 2023-04-19: 15 mL via OROMUCOSAL

## 2023-04-18 MED ORDER — LACTATED RINGERS IV SOLN: INTRAVENOUS | Status: DC

## 2023-04-18 MED ORDER — ORAL CARE MOUTH RINSE: 15.0000 mL | Freq: Once | OROMUCOSAL | Status: AC

## 2023-04-18 NOTE — Patient Instructions (Signed)
 Your procedure is scheduled on: Tuesday, March 25 Report to the Registration Desk on the 1st floor of the [facility] To find out your arrival time, please call [phone] between 1PM - 3PM on: Monday, March 24 If your arrival time is 6:00 am, do not arrive before that time as the [facility] entrance doors do not open until 6:00 am.  REMEMBER: Instructions that are not followed completely may result in serious medical risk, up to and including death; or upon the discretion of your surgeon and anesthesiologist your surgery may need to be rescheduled.  Do not eat or drink after midnight the night before surgery.  No gum chewing or hard candies.  One week prior to surgery: Stop Anti-inflammatories (NSAIDS) such as Advil, Aleve, Ibuprofen, Motrin, Naproxen, Naprosyn and Aspirin based products such as Excedrin, Goody's Powder, BC Powder. Stop ANY OVER THE COUNTER supplements until after surgery.  You may however, continue to take Tylenol if needed for pain up until the day of surgery.  Continue taking all of your other prescription medications up until the day of surgery.  ON THE DAY OF SURGERY ONLY TAKE THESE MEDICATIONS WITH SIPS OF WATER:  Simvastatin (Zocor)  No Alcohol for 24 hours before or after surgery.  No Smoking including e-cigarettes for 24 hours before surgery.  No chewable tobacco products for at least 6 hours before surgery.  No nicotine patches on the day of surgery.  Do not use any "recreational" drugs for at least a week (preferably 2 weeks) before your surgery.  Please be advised that the combination of cocaine and anesthesia may have negative outcomes, up to and including death. If you test positive for cocaine, your surgery will be cancelled.  On the morning of surgery brush your teeth with toothpaste and water, you may rinse your mouth with mouthwash if you wish. Do not swallow any toothpaste or mouthwash.  Do not wear jewelry, make-up, hairpins, clips or  nail polish.  For welded (permanent) jewelry: bracelets, anklets, waist bands, etc.  Please have this removed prior to surgery.  If it is not removed, there is a chance that hospital personnel will need to cut it off on the day of surgery.  Do not wear lotions, powders, or perfumes.   Do not shave body hair from the neck down 48 hours before surgery.  Contact lenses, hearing aids and dentures may not be worn into surgery.  Do not bring valuables to the hospital. [facility] is not responsible for any missing/lost belongings or valuables.   Notify your doctor if there is any change in your medical condition (cold, fever, infection).  Wear comfortable clothing (specific to your surgery type) to the hospital.  After surgery, you can help prevent lung complications by doing breathing exercises.  Take deep breaths and cough every 1-2 hours.   If you are being discharged the day of surgery, you will not be allowed to drive home. You will need a responsible individual to drive you home and stay with you for 24 hours after surgery.   If you are taking public transportation, you will need to have a responsible individual with you.  Please call the Pre-admissions Testing Dept. at [phone] if you have any questions about these instructions.  Surgery Visitation Policy:  Patients having surgery or a procedure may have two visitors.  Children under the age of 76 must have an adult with them who is not the patient.  Temporary Visitor Restrictions Due to increasing cases  of flu, RSV and COVID-19: Children ages 43 and under will not be able to visit patients in [facility] hospitals under most circumstances.

## 2023-04-19 ENCOUNTER — Ambulatory Visit

## 2023-04-19 ENCOUNTER — Encounter: Payer: Self-pay | Admitting: Student in an Organized Health Care Education/Training Program

## 2023-04-19 ENCOUNTER — Ambulatory Visit: Payer: Self-pay | Admitting: Urgent Care

## 2023-04-19 ENCOUNTER — Ambulatory Visit
Admission: RE | Admit: 2023-04-19 | Discharge: 2023-04-19 | Disposition: A | Discharge status: Home / Self Care | Attending: Student in an Organized Health Care Education/Training Program | Admitting: Student in an Organized Health Care Education/Training Program

## 2023-04-19 ENCOUNTER — Ambulatory Visit: Payer: Self-pay | Admitting: Anesthesiology

## 2023-04-19 ENCOUNTER — Encounter
Admission: RE | Disposition: A | Discharge status: Home / Self Care | Payer: Self-pay | Source: Home / Self Care | Attending: Student in an Organized Health Care Education/Training Program

## 2023-04-19 ENCOUNTER — Other Ambulatory Visit: Payer: Self-pay

## 2023-04-19 DIAGNOSIS — R591 Generalized enlarged lymph nodes: Secondary | ICD-10-CM | POA: Insufficient documentation

## 2023-04-19 DIAGNOSIS — C781 Secondary malignant neoplasm of mediastinum: Secondary | ICD-10-CM | POA: Insufficient documentation

## 2023-04-19 DIAGNOSIS — I6529 Occlusion and stenosis of unspecified carotid artery: Secondary | ICD-10-CM | POA: Diagnosis not present

## 2023-04-19 DIAGNOSIS — R590 Localized enlarged lymph nodes: Secondary | ICD-10-CM | POA: Diagnosis present

## 2023-04-19 DIAGNOSIS — C771 Secondary and unspecified malignant neoplasm of intrathoracic lymph nodes: Secondary | ICD-10-CM | POA: Insufficient documentation

## 2023-04-19 DIAGNOSIS — I739 Peripheral vascular disease, unspecified: Secondary | ICD-10-CM | POA: Diagnosis not present

## 2023-04-19 DIAGNOSIS — Z87891 Personal history of nicotine dependence: Secondary | ICD-10-CM | POA: Diagnosis not present

## 2023-04-19 DIAGNOSIS — J449 Chronic obstructive pulmonary disease, unspecified: Secondary | ICD-10-CM | POA: Diagnosis not present

## 2023-04-19 DIAGNOSIS — I4719 Other supraventricular tachycardia: Secondary | ICD-10-CM | POA: Diagnosis not present

## 2023-04-19 DIAGNOSIS — K449 Diaphragmatic hernia without obstruction or gangrene: Secondary | ICD-10-CM | POA: Insufficient documentation

## 2023-04-19 DIAGNOSIS — M1990 Unspecified osteoarthritis, unspecified site: Secondary | ICD-10-CM | POA: Diagnosis not present

## 2023-04-19 DIAGNOSIS — Z902 Acquired absence of lung [part of]: Secondary | ICD-10-CM | POA: Diagnosis not present

## 2023-04-19 DIAGNOSIS — Z85118 Personal history of other malignant neoplasm of bronchus and lung: Secondary | ICD-10-CM | POA: Diagnosis not present

## 2023-04-19 DIAGNOSIS — I10 Essential (primary) hypertension: Secondary | ICD-10-CM | POA: Diagnosis not present

## 2023-04-19 DIAGNOSIS — J439 Emphysema, unspecified: Secondary | ICD-10-CM | POA: Diagnosis not present

## 2023-04-19 DIAGNOSIS — I4891 Unspecified atrial fibrillation: Secondary | ICD-10-CM | POA: Insufficient documentation

## 2023-04-19 DIAGNOSIS — K219 Gastro-esophageal reflux disease without esophagitis: Secondary | ICD-10-CM | POA: Insufficient documentation

## 2023-04-19 HISTORY — PX: ENDOBRONCHIAL ULTRASOUND: SHX5096

## 2023-04-19 SURGERY — ENDOBRONCHIAL ULTRASOUND (EBUS)
Anesthesia: General | Laterality: Bilateral

## 2023-04-19 MED ORDER — PROPOFOL 1000 MG/100ML IV EMUL
INTRAVENOUS | Status: AC
  Filled 2023-04-19: qty 100

## 2023-04-19 MED ORDER — FENTANYL CITRATE (PF) 100 MCG/2ML IJ SOLN
INTRAMUSCULAR | Status: DC | PRN
  Administered 2023-04-19: 50 ug via INTRAVENOUS

## 2023-04-19 MED ORDER — LIDOCAINE HCL (CARDIAC) PF 100 MG/5ML IV SOSY
PREFILLED_SYRINGE | INTRAVENOUS | Status: DC | PRN
  Administered 2023-04-19: 60 mg via INTRAVENOUS

## 2023-04-19 MED ORDER — ONDANSETRON HCL 4 MG/2ML IJ SOLN
INTRAMUSCULAR | Status: AC
  Filled 2023-04-19: qty 2

## 2023-04-19 MED ORDER — ROCURONIUM BROMIDE 10 MG/ML (PF) SYRINGE
PREFILLED_SYRINGE | INTRAVENOUS | Status: AC
  Filled 2023-04-19: qty 10

## 2023-04-19 MED ORDER — PROPOFOL 10 MG/ML IV BOLUS
INTRAVENOUS | Status: DC | PRN
  Administered 2023-04-19: 130 mg via INTRAVENOUS

## 2023-04-19 MED ORDER — CHLORHEXIDINE GLUCONATE 0.12 % MT SOLN
OROMUCOSAL | Status: AC
  Filled 2023-04-19: qty 15

## 2023-04-19 MED ORDER — ONDANSETRON HCL 4 MG/2ML IJ SOLN
INTRAMUSCULAR | Status: DC | PRN
  Administered 2023-04-19: 4 mg via INTRAVENOUS

## 2023-04-19 MED ORDER — PROPOFOL 500 MG/50ML IV EMUL
INTRAVENOUS | Status: DC | PRN
  Administered 2023-04-19: 120 ug/kg/min via INTRAVENOUS

## 2023-04-19 MED ORDER — SUGAMMADEX SODIUM 200 MG/2ML IV SOLN
INTRAVENOUS | Status: DC | PRN
  Administered 2023-04-19: 120 mg via INTRAVENOUS

## 2023-04-19 MED ORDER — FENTANYL CITRATE (PF) 100 MCG/2ML IJ SOLN
INTRAMUSCULAR | Status: AC
  Filled 2023-04-19: qty 2

## 2023-04-19 MED ORDER — ROCURONIUM BROMIDE 100 MG/10ML IV SOLN
INTRAVENOUS | Status: DC | PRN
  Administered 2023-04-19: 100 mg via INTRAVENOUS

## 2023-04-19 MED ORDER — LIDOCAINE HCL (PF) 2 % IJ SOLN
INTRAMUSCULAR | Status: AC
  Filled 2023-04-19: qty 5

## 2023-04-19 NOTE — Anesthesia Preprocedure Evaluation (Signed)
 Anesthesia Evaluation  Patient identified by MRN, date of birth, ID band Patient awake    Reviewed: Allergy & Precautions, NPO status , Patient's Chart, lab work & pertinent test results  History of Anesthesia Complications (+) PONV and history of anesthetic complications  Airway Mallampati: II  TM Distance: >3 FB Neck ROM: Full    Dental no notable dental hx. (+) Chipped   Pulmonary neg pulmonary ROS, COPD,  COPD inhaler, former smoker Lung CA s/p lobectomy 2019   Pulmonary exam normal breath sounds clear to auscultation       Cardiovascular hypertension, + Peripheral Vascular Disease (carotid stenosis)  negative cardio ROS Normal cardiovascular exam+ dysrhythmias (paroxysmal atrial tachycardia) Atrial Fibrillation  Rhythm:Regular Rate:Normal  ECG 11/27/20:  Sinus rhythm with Premature supraventricular complexes Otherwise normal ECG  Echo 10/30/20:  NORMAL LEFT VENTRICULAR SYSTOLIC FUNCTION  NORMAL RIGHT VENTRICULAR SYSTOLIC FUNCTION  MILD VALVULAR REGURGITATION   NO VALVULAR STENOSIS   Neuro/Psych Cerebral aneurysm s/p repair 2004 negative neurological ROS  negative psych ROS   GI/Hepatic negative GI ROS, Neg liver ROS, hiatal hernia,GERD  ,,  Endo/Other  negative endocrine ROS    Renal/GU Renal disease (nephrolithiasis)     Musculoskeletal  (+) Arthritis ,    Abdominal   Peds  Hematology negative hematology ROS (+)   Anesthesia Other Findings Past Medical History: No date: Anginal pain (HCC) No date: Benign essential hypertension No date: Brain aneurysm No date: Carotid artery stenosis 06/19/2013: Carotid stenosis No date: Colonic polyp No date: Emphysema lung (HCC) No date: GERD (gastroesophageal reflux disease) No date: Hiatal hernia No date: Hypercholesteremia 06/09/2015: Hyperlipidemia, mixed 2016: Lung cancer (HCC) 06/21/2014: Lung nodule 10/06/2014: Macrocytic No date: Multiple thyroid  nodules 06/19/2013: PAT (paroxysmal atrial tachycardia) (HCC) No date: Plantar fasciitis  Past Surgical History: 40 yrs ago: BREAST EXCISIONAL BIOPSY; Right     Comment:  neg No date: BREAST LUMPECTOMY     Comment:  benign No date: CATARACT EXTRACTION W/ INTRAOCULAR LENS  IMPLANT, BILATERAL;  Bilateral 2004: CEREBRAL ANEURYSM REPAIR     Comment:  coil treatment No date: CERVIX LESION DESTRUCTION No date: COLONOSCOPY     Comment:  1995, 2006, 2014 03/31/2015: COLONOSCOPY WITH PROPOFOL; N/A     Comment:  Procedure: COLONOSCOPY WITH PROPOFOL;  Surgeon: Scot Jun, MD;  Location: [facility] ENDOSCOPY;  Service:               Endoscopy;  Laterality: N/A; 03/02/2022: CYSTOSCOPY W/ RETROGRADES; Left     Comment:  Procedure: CYSTOSCOPY WITH RETROGRADE PYELOGRAM;                Surgeon: Riki Altes, MD;  Location: [facility] ORS;                Service: Urology;  Laterality: Left; 12/28/2022: CYSTOSCOPY W/ RETROGRADES; Left     Comment:  Procedure: CYSTOSCOPY WITH RETROGRADE PYELOGRAM;                Surgeon: Riki Altes, MD;  Location: [facility] ORS;                Service: Urology;  Laterality: Left; 03/03/2021: CYSTOSCOPY W/ URETERAL STENT PLACEMENT; Left     Comment:  Procedure: CYSTOSCOPY WITH EXCHANGE;  Surgeon: Riki Altes, MD;  Location: [facility] ORS;  Service: Urology;  Laterality: Left; 03/02/2022: CYSTOSCOPY W/ URETERAL STENT PLACEMENT; Left     Comment:  Procedure: CYSTOSCOPY WITH STENT EXCHANGE;  Surgeon:               Riki Altes, MD;  Location: [facility] ORS;  Service:               Urology;  Laterality: Left; 12/28/2022: CYSTOSCOPY W/ URETERAL STENT PLACEMENT; Left     Comment:  Procedure: CYSTOSCOPY WITH STENT EXCHANGE;  Surgeon:               Riki Altes, MD;  Location: [facility] ORS;  Service:               Urology;  Laterality: Left; 11/25/2020: CYSTOSCOPY WITH STENT PLACEMENT; Left     Comment:  Procedure: CYSTOSCOPY WITH  STENT PLACEMENT;  Surgeon:               Riki Altes, MD;  Location: [facility] ORS;  Service:               Urology;  Laterality: Left; 03/10/2017: FLEXIBLE BRONCHOSCOPY; N/A     Comment:  Procedure: FLEXIBLE BRONCHOSCOPY;  Surgeon: Hulda Marin, MD;  Location: [facility] ORS;  Service: Thoracic;                Laterality: N/A; 03/13/2014: THORACOSCOPY WITH WEDGE RESECTION LUNG; Right     Comment:  RUL 03/10/2017: THORACOTOMY/LOBECTOMY; Left     Comment:  Procedure: THORACOTOMY WITH LUNG WEDGE RESECTION               POSSIBLE LOBECTOMY;  Surgeon: Hulda Marin, MD;                Location: [facility] ORS;  Service: Thoracic;  Laterality:               Left; No date: TUBAL LIGATION  BMI    Body Mass Index: 23.41 kg/m      Reproductive/Obstetrics negative OB ROS                             Anesthesia Physical Anesthesia Plan  ASA: 2  Anesthesia Plan: General ETT   Post-op Pain Management: Minimal or no pain anticipated   Induction: Intravenous  PONV Risk Score and Plan: 4 or greater and Ondansetron and Dexamethasone  Airway Management Planned: Oral ETT  Additional Equipment: None  Intra-op Plan:   Post-operative Plan: Extubation in OR  Informed Consent: I have reviewed the patients History and Physical, chart, labs and discussed the procedure including the risks, benefits and alternatives for the proposed anesthesia with the patient or authorized representative who has indicated his/her understanding and acceptance.     Dental Advisory Given  Plan Discussed with: Anesthesiologist, CRNA and Surgeon  Anesthesia Plan Comments: (Patient consented for risks of anesthesia including but not limited to:  - adverse reactions to medications - damage to eyes, teeth, lips or other oral mucosa - nerve damage due to positioning  - sore throat or hoarseness - Damage to heart, brain, nerves, lungs, other parts of body or loss of life  Patient  voiced understanding and assent.)        Anesthesia Quick Evaluation

## 2023-04-19 NOTE — Anesthesia Procedure Notes (Signed)
 Procedure Name: Intubation Date/Time: 04/19/2023 8:49 AM  Performed by: Elmarie Mainland, CRNAPre-anesthesia Checklist: Patient identified, Emergency Drugs available, Suction available and Patient being monitored Patient Re-evaluated:Patient Re-evaluated prior to induction Oxygen Delivery Method: Circle system utilized Preoxygenation: Pre-oxygenation with 100% oxygen Induction Type: IV induction Ventilation: Mask ventilation without difficulty Laryngoscope Size: McGrath and 3 Grade View: Grade I Tube type: Oral Tube size: 8.0 mm Number of attempts: 1 Airway Equipment and Method: Video-laryngoscopy and Stylet Placement Confirmation: ETT inserted through vocal cords under direct vision, positive ETCO2 and breath sounds checked- equal and bilateral Secured at: 20 cm Tube secured with: Tape Dental Injury: Teeth and Oropharynx as per pre-operative assessment

## 2023-04-19 NOTE — Transfer of Care (Signed)
 Immediate Anesthesia Transfer of Care Note  Patient: Heather [name]  Procedure(s) Performed: ENDOBRONCHIAL ULTRASOUND (EBUS) (Bilateral)  Patient Location: PACU  Anesthesia Type:General  Level of Consciousness: awake, oriented, drowsy, and patient cooperative  Airway & Oxygen Therapy: Patient Spontanous Breathing and Patient connected to face mask oxygen  Post-op Assessment: Report given to RN and Post -op Vital signs reviewed and stable  Post vital signs: Reviewed and stable  Last Vitals:  Vitals Value Taken Time  BP 157/58 04/19/23 0932  Temp 36.7 C 04/19/23 0932  Pulse 57 04/19/23 0934  Resp 15 04/19/23 0934  SpO2 100 % 04/19/23 0934  Vitals shown include unfiled device data.  Last Pain:  Vitals:   04/19/23 0654  TempSrc: Temporal  PainSc: 0-No pain         Complications: No notable events documented.

## 2023-04-19 NOTE — Op Note (Signed)
 Flexible and EBUS Bronchoscopy Procedure Note  Heather [name]  [id number]  [birth year]  Date:04/19/23  Time:9:21 AM   Provider Performing:Kinnley Paulson   Procedure: Flexible bronchoscopy and EBUS Bronchoscopy  Indication(s) Mediastinal lymphadenopathy  Consent Risks of the procedure as well as the alternatives and risks of each were explained to the patient and/or caregiver.  Consent for the procedure was obtained.  Anesthesia General Anesthesia   Time Out Verified patient identification, verified procedure, site/side was marked, verified correct patient position, special equipment/implants available, medications/allergies/relevant history reviewed, required imaging and test results available.   Sterile Technique Usual hand hygiene, masks, gowns, and gloves were used   Procedure Description Diagnostic bronchoscope advanced through endotracheal tube and into airway.  Airways were examined down to subsegmental level with findings noted below.  Following diagnostic evaluation, the diagnostic bronchoscope was removed and the EBUS bronchoscope was advanced into airway with stations 4R biopsied and sent for slide, cell block, flow cytometry and culture.  The EBUS bronchoscope was removed after assuring no active bleeding from biopsy site.  Findings: normal tracheobronchial tree, no endobronchial lesions. EBUS performed, only the mediastinal lymph node station 4R was enlarged. This was biopsied with a 21G Olympus ViziShot 2 needle and samples sent for slide, cell block, flow, and culture.   Complications/Tolerance None; patient tolerated the procedure well. Chest X-ray is needed post procedure.   EBL Minimal   Heather Chute, MD [facility] 04/19/2023 9:23 AM

## 2023-04-19 NOTE — Anesthesia Postprocedure Evaluation (Signed)
 Anesthesia Post Note  Patient: Heather [name]  Procedure(s) Performed: ENDOBRONCHIAL ULTRASOUND (EBUS) (Bilateral)  Patient location during evaluation: PACU Anesthesia Type: General Level of consciousness: awake and alert Pain management: pain level controlled Vital Signs Assessment: post-procedure vital signs reviewed and stable Respiratory status: spontaneous breathing, nonlabored ventilation, respiratory function stable and patient connected to nasal cannula oxygen Cardiovascular status: blood pressure returned to baseline and stable Postop Assessment: no apparent nausea or vomiting Anesthetic complications: no  No notable events documented.   Last Vitals:  Vitals:   04/19/23 0654 04/19/23 0932  BP: (!) 169/68 (!) 157/58  Pulse: 63 (!) 57  Resp: 18 13  Temp: 36.7 C 36.7 C  SpO2: 95% 100%    Last Pain:  Vitals:   04/19/23 0932  TempSrc:   PainSc: 0-No pain                 Stephanie Coup

## 2023-04-19 NOTE — Interval H&P Note (Signed)
 S: feels well, no symptoms, no chest pain, wheeze, or shortness of breath.  O: Vitals:   04/19/23 0654  BP: (!) 169/68  Pulse: 63  Resp: 18  Temp: 98.1 F (36.7 C)  SpO2: 95%   General: Well-appearing and in no distress. Well-nourished Eyes: Anicteric, no conjunctival pallor HEENT: Mucous membranes moist, no evidence of postnasal drip Respiratory: Trachea is midline, no respiratory distress, good bilateral air entry, no wheezes, rales, or rhonchi Cardiovascular: Heart with regular rate and rhythm, normal S1 and S2, no murmurs, rubs, or gallops Gastrointestinal: Normoactive bowel sounds, soft and nontender Neuro: Alert and oriented, no gross focal deficits  A/P: 88 year old female with history of lung ca (2 separate primaries s/p two separate wedge resections) who presents with an enlarged hilar lymph node. Plan for EBUS with [facility]. Patient is appropriate for the procedure. Risks and benefits explained to patient and husband.  Heather Chute, MD [facility] 04/19/2023 8:36 AM

## 2023-04-20 ENCOUNTER — Encounter: Payer: Self-pay | Admitting: Student in an Organized Health Care Education/Training Program

## 2023-04-20 LAB — ACID FAST SMEAR (AFB, MYCOBACTERIA): Acid Fast Smear: NEGATIVE

## 2023-04-21 ENCOUNTER — Other Ambulatory Visit: Payer: Self-pay

## 2023-04-21 ENCOUNTER — Telehealth: Payer: Self-pay

## 2023-04-21 DIAGNOSIS — C3411 Malignant neoplasm of upper lobe, right bronchus or lung: Secondary | ICD-10-CM

## 2023-04-21 LAB — COMP PANEL: LEUKEMIA/LYMPHOMA

## 2023-04-21 LAB — CYTOLOGY - NON PAP

## 2023-04-21 NOTE — Telephone Encounter (Signed)
 Spoke to pt she is aware of plan and appts.

## 2023-04-21 NOTE — Telephone Encounter (Signed)
 Per Dr. Cathie Hoops "please let her know that biopsy confirmed recurrent cancer, and she will see me and Dr. Rushie Chestnut to see how to treatment it. MRI brain is to make sure cancer does not spread there"   Called pt at mobile and home #, no answer, detailed VM left with MD response and appt details for Dr. Cathie Hoops and Dr. Marena Chancy.   Scheduling to contact pt with MRI brain appt details.

## 2023-04-24 LAB — AEROBIC/ANAEROBIC CULTURE W GRAM STAIN (SURGICAL/DEEP WOUND)
Culture: NO GROWTH
Gram Stain: NONE SEEN

## 2023-04-26 ENCOUNTER — Ambulatory Visit
Admission: RE | Admit: 2023-04-26 | Discharge: 2023-04-26 | Disposition: A | Discharge status: Home / Self Care | Source: Ambulatory Visit | Attending: Oncology | Admitting: Oncology

## 2023-04-26 DIAGNOSIS — C3411 Malignant neoplasm of upper lobe, right bronchus or lung: Secondary | ICD-10-CM | POA: Diagnosis present

## 2023-04-26 MED ORDER — GADOBUTROL 1 MMOL/ML IV SOLN
5.0000 mL | Freq: Once | INTRAVENOUS | Status: AC | PRN
  Administered 2023-04-26: 5 mL via INTRAVENOUS

## 2023-04-27 ENCOUNTER — Ambulatory Visit
Admission: RE | Admit: 2023-04-27 | Discharge: 2023-04-27 | Disposition: A | Discharge status: Home / Self Care | Source: Ambulatory Visit | Attending: Radiation Oncology | Admitting: Radiation Oncology

## 2023-04-27 ENCOUNTER — Inpatient Hospital Stay: Attending: Oncology | Admitting: Oncology

## 2023-04-27 ENCOUNTER — Encounter: Payer: Self-pay | Admitting: Oncology

## 2023-04-27 ENCOUNTER — Encounter: Payer: Self-pay | Admitting: Radiation Oncology

## 2023-04-27 ENCOUNTER — Encounter: Payer: Self-pay | Admitting: *Deleted

## 2023-04-27 VITALS — BP 163/67 | HR 57 | Temp 96.0°F | Resp 18 | Wt 128.2 lb

## 2023-04-27 DIAGNOSIS — Z7189 Other specified counseling: Secondary | ICD-10-CM | POA: Diagnosis not present

## 2023-04-27 DIAGNOSIS — Z510 Encounter for antineoplastic radiation therapy: Secondary | ICD-10-CM | POA: Insufficient documentation

## 2023-04-27 DIAGNOSIS — Z85118 Personal history of other malignant neoplasm of bronchus and lung: Secondary | ICD-10-CM | POA: Diagnosis not present

## 2023-04-27 DIAGNOSIS — Z860100 Personal history of colon polyps, unspecified: Secondary | ICD-10-CM | POA: Insufficient documentation

## 2023-04-27 DIAGNOSIS — R110 Nausea: Secondary | ICD-10-CM | POA: Insufficient documentation

## 2023-04-27 DIAGNOSIS — Z9221 Personal history of antineoplastic chemotherapy: Secondary | ICD-10-CM | POA: Insufficient documentation

## 2023-04-27 DIAGNOSIS — J432 Centrilobular emphysema: Secondary | ICD-10-CM | POA: Insufficient documentation

## 2023-04-27 DIAGNOSIS — K449 Diaphragmatic hernia without obstruction or gangrene: Secondary | ICD-10-CM | POA: Diagnosis not present

## 2023-04-27 DIAGNOSIS — E785 Hyperlipidemia, unspecified: Secondary | ICD-10-CM | POA: Diagnosis not present

## 2023-04-27 DIAGNOSIS — E042 Nontoxic multinodular goiter: Secondary | ICD-10-CM | POA: Diagnosis not present

## 2023-04-27 DIAGNOSIS — Z803 Family history of malignant neoplasm of breast: Secondary | ICD-10-CM | POA: Diagnosis not present

## 2023-04-27 DIAGNOSIS — Z87891 Personal history of nicotine dependence: Secondary | ICD-10-CM | POA: Insufficient documentation

## 2023-04-27 DIAGNOSIS — N1330 Unspecified hydronephrosis: Secondary | ICD-10-CM | POA: Diagnosis not present

## 2023-04-27 DIAGNOSIS — Z79899 Other long term (current) drug therapy: Secondary | ICD-10-CM | POA: Insufficient documentation

## 2023-04-27 DIAGNOSIS — D751 Secondary polycythemia: Secondary | ICD-10-CM | POA: Insufficient documentation

## 2023-04-27 DIAGNOSIS — I700 Atherosclerosis of aorta: Secondary | ICD-10-CM | POA: Diagnosis not present

## 2023-04-27 DIAGNOSIS — I10 Essential (primary) hypertension: Secondary | ICD-10-CM | POA: Diagnosis not present

## 2023-04-27 DIAGNOSIS — E7800 Pure hypercholesterolemia, unspecified: Secondary | ICD-10-CM | POA: Insufficient documentation

## 2023-04-27 DIAGNOSIS — C3411 Malignant neoplasm of upper lobe, right bronchus or lung: Secondary | ICD-10-CM | POA: Insufficient documentation

## 2023-04-27 DIAGNOSIS — J439 Emphysema, unspecified: Secondary | ICD-10-CM | POA: Insufficient documentation

## 2023-04-27 DIAGNOSIS — E782 Mixed hyperlipidemia: Secondary | ICD-10-CM | POA: Diagnosis not present

## 2023-04-27 DIAGNOSIS — C3432 Malignant neoplasm of lower lobe, left bronchus or lung: Secondary | ICD-10-CM | POA: Insufficient documentation

## 2023-04-27 DIAGNOSIS — T451X5A Adverse effect of antineoplastic and immunosuppressive drugs, initial encounter: Secondary | ICD-10-CM | POA: Insufficient documentation

## 2023-04-27 MED ORDER — ONDANSETRON HCL 8 MG PO TABS: 8.0000 mg | ORAL_TABLET | Freq: Three times a day (TID) | ORAL | 1 refills | Status: DC | PRN

## 2023-04-27 MED ORDER — PROCHLORPERAZINE MALEATE 10 MG PO TABS: 10.0000 mg | ORAL_TABLET | Freq: Four times a day (QID) | ORAL | 1 refills | Status: DC | PRN

## 2023-04-27 NOTE — Assessment & Plan Note (Signed)
 Discussed with patient and wife. Potential curative intent.

## 2023-04-27 NOTE — Progress Notes (Signed)
START OFF PATHWAY REGIMEN - Non-Small Cell Lung   [id number]:Carboplatin + Paclitaxel (2/50) + RT weekly x 6 weeks:   Administer weekly during RT:     Paclitaxel      Carboplatin   **Always confirm dose/schedule in your pharmacy ordering system**  Patient Characteristics: Local Recurrence Therapeutic Status: Local Recurrence Intent of Therapy: Curative Intent, Discussed with Patient

## 2023-04-27 NOTE — Assessment & Plan Note (Signed)
Same plan as above.  

## 2023-04-27 NOTE — Consult Note (Signed)
 NEW PATIENT EVALUATION  Name: Heather [name]  MRN: [id number]  Date:   04/27/2023     DOB: 11-15-1935   This 88 y.o. female patient presents to the clinic for initial evaluation of stage IIIa (T2a N2 M0) non-small cell lung cancer presenting as recurrent mediastinal positive node.  REFERRING PHYSICIAN: Rickard Patience, MD  CHIEF COMPLAINT: No chief complaint on file.   DIAGNOSIS: The encounter diagnosis was Malignant neoplasm of upper lobe, right bronchus or lung (HCC).   PREVIOUS INVESTIGATIONS:  CT scans PET CT scans reviewed MRI of brain reviewed Clinical notes reviewed Pathology cytology reports reviewed  HPI: Patient is a 88 year old female.  Her history is interesting she is status post a right upper lobe wedge resection back in 2016 for a 1.1 cm high-grade adenocarcinoma with pleural invasion.  She is also status post a Reds resection in 2019 for an 8 mm invasive adenocarcinoma with solid and acinar patterns.  She is recently on serial scans showed increasing mediastinal node.  This was hypermetabolic on PET CT scan although no evidence of distant metastatic disease or other lung pathology was noted.  She underwent bronchoscopy which was positive for metastatic non-small cell lung cancer.  She did also have on PET/CT a left oblique fissure which has slight hypermetabolic activity although has been stable since 23.  She also has double-J ureteral stents in place on the left.  She is asymptomatic specifically [place] cough hemoptysis chest tightness or any change in pulmonary status.  She has been seen by medical oncology and recommendations been made for concurrent chemoradiation therapy.  She is seen today for opinion.  Patient's brain MRI scan I have reviewed showing no evidence to suggest metastatic disease although it has not been formally read by radiology.  PLANNED TREATMENT REGIMEN: Concurrent chemoradiation therapy  PAST MEDICAL HISTORY:  has a past medical history of Anginal pain (HCC),  Benign essential hypertension, Brain aneurysm, Carotid artery stenosis, Carotid stenosis (06/19/2013), Colonic polyp, Emphysema lung (HCC), GERD (gastroesophageal reflux disease), Hiatal hernia, Hypercholesteremia, Hyperlipidemia, mixed (06/09/2015), Lung cancer (HCC) (2016), Lung nodule (06/21/2014), Macrocytic (10/06/2014), Multiple thyroid nodules, PAT (paroxysmal atrial tachycardia) (HCC) (06/19/2013), and Plantar fasciitis.    PAST SURGICAL HISTORY:  Past Surgical History:  Procedure Laterality Date   BREAST EXCISIONAL BIOPSY Right 40 yrs ago   neg   BREAST LUMPECTOMY     benign   CATARACT EXTRACTION W/ INTRAOCULAR LENS  IMPLANT, BILATERAL Bilateral    CEREBRAL ANEURYSM REPAIR  2004   coil treatment   CERVIX LESION DESTRUCTION     COLONOSCOPY     1995, 2006, 2014   COLONOSCOPY WITH PROPOFOL N/A 03/31/2015   Procedure: COLONOSCOPY WITH PROPOFOL;  Surgeon: Scot Jun, MD;  Location: [facility] ENDOSCOPY;  Service: Endoscopy;  Laterality: N/A;   CYSTOSCOPY W/ RETROGRADES Left 03/02/2022   Procedure: CYSTOSCOPY WITH RETROGRADE PYELOGRAM;  Surgeon: Riki Altes, MD;  Location: [facility] ORS;  Service: Urology;  Laterality: Left;   CYSTOSCOPY W/ RETROGRADES Left 12/28/2022   Procedure: CYSTOSCOPY WITH RETROGRADE PYELOGRAM;  Surgeon: Riki Altes, MD;  Location: [facility] ORS;  Service: Urology;  Laterality: Left;   CYSTOSCOPY W/ URETERAL STENT PLACEMENT Left 03/03/2021   Procedure: CYSTOSCOPY WITH EXCHANGE;  Surgeon: Riki Altes, MD;  Location: [facility] ORS;  Service: Urology;  Laterality: Left;   CYSTOSCOPY W/ URETERAL STENT PLACEMENT Left 03/02/2022   Procedure: CYSTOSCOPY WITH STENT EXCHANGE;  Surgeon: Riki Altes, MD;  Location: [facility] ORS;  Service: Urology;  Laterality: Left;  CYSTOSCOPY W/ URETERAL STENT PLACEMENT Left 12/28/2022   Procedure: CYSTOSCOPY WITH STENT EXCHANGE;  Surgeon: Riki Altes, MD;  Location: [facility] ORS;  Service: Urology;  Laterality: Left;   CYSTOSCOPY WITH  STENT PLACEMENT Left 11/25/2020   Procedure: CYSTOSCOPY WITH STENT PLACEMENT;  Surgeon: Riki Altes, MD;  Location: [facility] ORS;  Service: Urology;  Laterality: Left;   ENDOBRONCHIAL ULTRASOUND Bilateral 04/19/2023   Procedure: ENDOBRONCHIAL ULTRASOUND (EBUS);  Surgeon: Raechel Chute, MD;  Location: [facility] ORS;  Service: Pulmonary;  Laterality: Bilateral;   FLEXIBLE BRONCHOSCOPY N/A 03/10/2017   Procedure: FLEXIBLE BRONCHOSCOPY;  Surgeon: Hulda Marin, MD;  Location: [facility] ORS;  Service: Thoracic;  Laterality: N/A;   THORACOSCOPY WITH WEDGE RESECTION LUNG Right 03/13/2014   RUL   THORACOTOMY/LOBECTOMY Left 03/10/2017   Procedure: THORACOTOMY WITH LUNG WEDGE RESECTION POSSIBLE LOBECTOMY;  Surgeon: Hulda Marin, MD;  Location: [facility] ORS;  Service: Thoracic;  Laterality: Left;   TUBAL LIGATION      FAMILY HISTORY: family history includes Alcohol abuse in her mother; Heart attack in her father.  SOCIAL HISTORY:  reports that she quit smoking about 34 years ago. Her smoking use included cigarettes. She started smoking about 69 years ago. She has a 35 pack-year smoking history. She has never used smokeless tobacco. She reports current alcohol use. She reports that she does not use drugs.  ALLERGIES: Ace inhibitors, Contrast media [iodinated contrast media], and Cortisone  MEDICATIONS:  Current Outpatient Medications  Medication Sig Dispense Refill   amLODipine (NORVASC) 5 MG tablet Take 5 mg by mouth at bedtime.     amoxicillin (AMOXIL) 875 MG tablet Take 875 mg by mouth as needed.     atenolol (TENORMIN) 50 MG tablet Take 50 mg by mouth at bedtime.     calcium-vitamin D (OSCAL WITH D) 500-200 MG-UNIT tablet Take 1 tablet by mouth daily with breakfast.     cetirizine (ZYRTEC) 10 MG tablet Take 10 mg by mouth daily.     dextromethorphan-guaiFENesin (MUCINEX DM) 30-600 MG 12hr tablet Take 1 tablet by mouth 2 (two) times daily as needed for cough.     donepezil (ARICEPT) 10 MG tablet Take 1 tablet  by mouth at bedtime.     fluticasone (FLONASE) 50 MCG/ACT nasal spray Place 1 spray into the nose daily as needed for allergies.      simvastatin (ZOCOR) 20 MG tablet Take 20 mg by mouth every morning.     vitamin B-12 (CYANOCOBALAMIN) 100 MCG tablet Take 100 mcg by mouth daily.     No current facility-administered medications for this encounter.    ECOG PERFORMANCE STATUS:  0 - Asymptomatic  REVIEW OF SYSTEMS: Patient has a history of multiple lung cancers as described above also erythrocytosis.  Also has a history of a brain aneurysm.  She also has PAT. Patient denies any weight loss, fatigue, weakness, fever, chills or night sweats. Patient denies any loss of vision, blurred vision. Patient denies any ringing  of the ears or hearing loss. No irregular heartbeat. Patient denies heart murmur or history of fainting. Patient denies any chest pain or pain radiating to her upper extremities. Patient denies any shortness of breath, difficulty breathing at night, cough or hemoptysis. Patient denies any swelling in the lower legs. Patient denies any nausea vomiting, vomiting of blood, or coffee ground material in the vomitus. Patient denies any stomach pain. Patient states has had normal bowel movements no significant constipation or diarrhea. Patient denies any dysuria, hematuria or significant nocturia. Patient denies any  problems walking, swelling in the joints or loss of balance. Patient denies any skin changes, loss of hair or loss of weight. Patient denies any excessive worrying or anxiety or significant depression. Patient denies any problems with insomnia. Patient denies excessive thirst, polyuria, polydipsia. Patient denies any swollen glands, patient denies easy bruising or easy bleeding. Patient denies any recent infections, allergies or URI. Patient "s visual fields have not changed significantly in recent time.   PHYSICAL EXAM: There were no vitals taken for this visit. Well-developed  well-nourished patient in NAD. HEENT reveals PERLA, EOMI, discs not visualized.  Oral cavity is clear. No oral mucosal lesions are identified. Neck is clear without evidence of cervical or supraclavicular adenopathy. Lungs are clear to A&P. Cardiac examination is essentially unremarkable with regular rate and rhythm without murmur rub or thrill. Abdomen is benign with no organomegaly or masses noted. Motor sensory and DTR levels are equal and symmetric in the upper and lower extremities. Cranial nerves II through XII are grossly intact. Proprioception is intact. No peripheral adenopathy or edema is identified. No motor or sensory levels are noted. Crude visual fields are within normal range.  LABORATORY DATA: Pathology cytology reports reviewed    RADIOLOGY RESULTS: CT scans PET CT scans MRI brain all reviewed compatible with above-stated findings   IMPRESSION: Stage IIIa non-small cell lung cancer presenting as a solitary progressive mediastinal node in patient with history of multiple lung cancers in the past in 88 year old female  PLAN: At this time elect to go ahead with concurrent chemoradiation therapy.  I would plan on delivering 85 Gray using IMRT treatment planning and delivery.  I would choose IMRT to spare critical structures such as her esophagus heart spinal cord and normal lung volume.  There will be extra effort by both professional staff as well as technical staff to coordinate and manage concurrent chemoradiation and ensuing side effects during her treatments.  Risks and benefits of treatment including possible radiation esophagitis fatigue alteration of blood counts skin reaction all were discussed in detail with the patient and her husband.  They both comprehend my treatment plan well.  I have personally set up and ordered CT simulation for early next week.  I would like to take this opportunity to thank you for allowing me to participate in the care of your patient.Carmina Miller, MD

## 2023-04-27 NOTE — Progress Notes (Signed)
 Met with patient and her husband during follow up visit with Dr. Cathie Hoops to discuss recent biopsy results and treatment options. All questions answered during visit. Reviewed upcoming appts. Pt given resources for lung cancer diagnosis and supportive services available. Contact info given and instructed to call with any questions or needs. Pt and husband verbalized understanding.

## 2023-04-27 NOTE — Assessment & Plan Note (Addendum)
#  History of stage Ib right lung upper lobe adenocarcinoma status post wedge resection in 2016 History of left lower lobe adenocarcinoma T1 a NX status post wedge resection 2019. 03/18/2023 Mildly enlarged pre tracheal lymph node is slightly increased, Stable bilateral areas of lung nodularity and postsurgical changes.  03/30/2023 PET scan showed Hypermetabolic pretracheal lymph node, concerning for disease recurrence.  Biopsy of pretracheal lymph node positive for NSCLC.  MRI brain is negative.  Patient is fit for her age and desires treatment to maximize her chance of cure.  Recommend concurrent chemotherapy [carboplatin taxol]  with radiation.  Rationale and side effects were reviewed with patient.  She agrees with the plan.  Will arrange chemotherapy education. Will start chemo when she starts RT Discussed plan with RadOnc Dr. Rushie Chestnut.

## 2023-04-27 NOTE — Progress Notes (Signed)
 Hematology/Oncology Progress note [phone] [phone]) [phone]     Chief Complaint:  Follow up for recurrent non small cell lung cancer   ASSESSMENT & PLAN:  Heather [name] is a 88 y.o. 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 T2aN1M0 (stage IB).  She is s/p wedge resection of a left lower lobe mass on 03/10/2017. T1aNx (stage IA).   Malignant neoplasm of lower lobe of left lung (HCC) #History of stage Ib right lung upper lobe adenocarcinoma status post wedge resection in 2016 History of left lower lobe adenocarcinoma T1 a NX status post wedge resection 2019. 03/18/2023 Mildly enlarged pre tracheal lymph node is slightly increased, Stable bilateral areas of lung nodularity and postsurgical changes.  03/30/2023 PET scan showed Hypermetabolic pretracheal lymph node, concerning for disease recurrence.  Biopsy of pretracheal lymph node positive for NSCLC.  MRI brain is negative.  Patient is fit for her age and desires treatment to maximize her chance of cure.  Recommend concurrent chemotherapy [carboplatin taxol]  with radiation.  Rationale and side effects were reviewed with patient.  She agrees with the plan.  Will arrange chemotherapy education. Will start chemo when she starts RT Discussed plan with RadOnc Dr. Rushie Chestnut.    Erythrocytosis JAK2 V617F mutation negative, with reflex to other mutations CALR, MPL, JAK 2 Ex 12-15 mutations negative.Negative BCR ABL. Most likely this is secondary erythrocytosis.  I suspect that this may be secondary to hypoxia due to underlying chronic lung diseases/emphysema or sleep apnea.  No need for phlebotomy given that hematocrit is less than 52. She declines sleep apnea work up  Primary cancer of right upper lobe of lung [facility]) Same plan as above.   Goals of care, counseling/discussion Discussed with patient and wife. Potential curative intent.    Orders Placed This Encounter   Procedures   Consent Attestation for Oncology Treatment    The patient is informed of risks, benefits, side-effects of the prescribed oncology treatment. Potential short term and long term side effects and response rates discussed. After a long discussion, the patient made informed decision to proceed.:   Yes   Follow up to be determined.   All questions were answered. The patient knows to call the clinic with any problems, questions or concerns.  We spent sufficient time to discuss many aspect of care, questions were answered to patient's satisfaction. A total of 40 minutes was spent on this visit.  With 5 minutes spent reviewing image findings, pathology reports, 30 minutes counseling the patient on the diagnosis, goal of care, chemotherapy treatments, side effects of the treatment, management of symptoms.  Additional 5 minutes was spent on answering patient's questions.   Rickard Patience, MD, PhD [facility] Hematology Oncology 04/27/2023   PERTINENT ONCOLOGY HISTORY Previously follows up with Dr.Corcoran. [place] care with me on 03/15/2018  PET scan on 02/13/2014 revealed a 1 cm right upper lobe hypermetabolic nodule (SUV 3.8) and a 6 mm right upper lobe nodule (no metabolic activity). There was no mediastianl adenopathy.  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 Pathology revealed a 1.1 cm high-grade adenocarcinoma with pleural invasion. Nodes were negative. Pathologic stage was T2aN1M0 (stage IB).  Chest CT on 02/10/2017 revealed clear interval progression of the posterior left lower lobe pulmonary nodule, now measuring 12 mm in long axis and having a distinctly lobular contour. Imaging features were very concerning for neoplasm.  There was no change in the  7 mm nodule posterior to the right hilum.  PET scan on 02/18/2017 revealed a 11 x 8 mm posterior left lower lobe pulmonary nodule that had mildly progressed from prior studies and  demonstrated mild hypermetabolism (SUV 1.6).  The appearance was considered worrisome for an indolent primary bronchogenic neoplasm.  03/10/2017.  s/p wedge resection of a left lower lobe mass.   Pathology revealed an 8 mm invasive adenocarcinoma with solid and acinar patterns.  There was no visceral invasion.  There was no lymphovascular invasion.  All margins were negative.  Pathologic stage was T1aNx (stage IA).   INTERVAL HISTORY Heather [name] is a 88 y.o. female who has above history reviewed by me today presents for follow up visit for management of history of lung cancer, 6 months assessment.  Patient reports feeling well.  She was accompanied by her husband. Denies shortness of breath, fatigue, hemoptysis, unintentional weight loss. S/p bronchoscopy biopsy of paratracheal lymph node.  Pathology showed non small cell lung cancer.  Patient presents for discussion management plan.      Past Medical History:  Diagnosis Date   Anginal pain (HCC)    Benign essential hypertension    Brain aneurysm    Carotid artery stenosis    Carotid stenosis 06/19/2013   Colonic polyp    Emphysema lung (HCC)    GERD (gastroesophageal reflux disease)    Hiatal hernia    Hypercholesteremia    Hyperlipidemia, mixed 06/09/2015   Lung cancer (HCC) 2016   Lung nodule 06/21/2014   Macrocytic 10/06/2014   Multiple thyroid nodules    PAT (paroxysmal atrial tachycardia) (HCC) 06/19/2013   Plantar fasciitis     Past Surgical History:  Procedure Laterality Date   BREAST EXCISIONAL BIOPSY Right 40 yrs ago   neg   BREAST LUMPECTOMY     benign   CATARACT EXTRACTION W/ INTRAOCULAR LENS  IMPLANT, BILATERAL Bilateral    CEREBRAL ANEURYSM REPAIR  2004   coil treatment   CERVIX LESION DESTRUCTION     COLONOSCOPY     1995, 2006, 2014   COLONOSCOPY WITH PROPOFOL N/A 03/31/2015   Procedure: COLONOSCOPY WITH PROPOFOL;  Surgeon: Scot Jun, MD;  Location: [facility] ENDOSCOPY;  Service: Endoscopy;   Laterality: N/A;   CYSTOSCOPY W/ RETROGRADES Left 03/02/2022   Procedure: CYSTOSCOPY WITH RETROGRADE PYELOGRAM;  Surgeon: Riki Altes, MD;  Location: [facility] ORS;  Service: Urology;  Laterality: Left;   CYSTOSCOPY W/ RETROGRADES Left 12/28/2022   Procedure: CYSTOSCOPY WITH RETROGRADE PYELOGRAM;  Surgeon: Riki Altes, MD;  Location: [facility] ORS;  Service: Urology;  Laterality: Left;   CYSTOSCOPY W/ URETERAL STENT PLACEMENT Left 03/03/2021   Procedure: CYSTOSCOPY WITH EXCHANGE;  Surgeon: Riki Altes, MD;  Location: [facility] ORS;  Service: Urology;  Laterality: Left;   CYSTOSCOPY W/ URETERAL STENT PLACEMENT Left 03/02/2022   Procedure: CYSTOSCOPY WITH STENT EXCHANGE;  Surgeon: Riki Altes, MD;  Location: [facility] ORS;  Service: Urology;  Laterality: Left;   CYSTOSCOPY W/ URETERAL STENT PLACEMENT Left 12/28/2022   Procedure: CYSTOSCOPY WITH STENT EXCHANGE;  Surgeon: Riki Altes, MD;  Location: [facility] ORS;  Service: Urology;  Laterality: Left;   CYSTOSCOPY WITH STENT PLACEMENT Left 11/25/2020   Procedure: CYSTOSCOPY WITH STENT PLACEMENT;  Surgeon: Riki Altes, MD;  Location: [facility] ORS;  Service: Urology;  Laterality: Left;   ENDOBRONCHIAL ULTRASOUND Bilateral 04/19/2023   Procedure: ENDOBRONCHIAL ULTRASOUND (EBUS);  Surgeon: Raechel Chute, MD;  Location: [facility] ORS;  Service: Pulmonary;  Laterality: Bilateral;  FLEXIBLE BRONCHOSCOPY N/A 03/10/2017   Procedure: FLEXIBLE BRONCHOSCOPY;  Surgeon: Hulda Marin, MD;  Location: [facility] ORS;  Service: Thoracic;  Laterality: N/A;   THORACOSCOPY WITH WEDGE RESECTION LUNG Right 03/13/2014   RUL   THORACOTOMY/LOBECTOMY Left 03/10/2017   Procedure: THORACOTOMY WITH LUNG WEDGE RESECTION POSSIBLE LOBECTOMY;  Surgeon: Hulda Marin, MD;  Location: [facility] ORS;  Service: Thoracic;  Laterality: Left;   TUBAL LIGATION      Family History  Problem Relation Age of Onset   Alcohol abuse Mother    Heart attack Father    Breast cancer Neg Hx     Social  History:  reports that she quit smoking about 34 years ago. Her smoking use included cigarettes. She started smoking about 69 years ago. She has a 35 pack-year smoking history. She has never used smokeless tobacco. She reports current alcohol use. She reports that she does not use drugs.   Allergies:  Allergies  Allergen Reactions   Ace Inhibitors Swelling   Contrast Media [Iodinated Contrast Media]    Cortisone     Patient had facial swelling and itching from cortisone injection IM    Current Medications: Current Outpatient Medications  Medication Sig Dispense Refill   amLODipine (NORVASC) 5 MG tablet Take 5 mg by mouth at bedtime.     amoxicillin (AMOXIL) 875 MG tablet Take 875 mg by mouth as needed.     atenolol (TENORMIN) 50 MG tablet Take 50 mg by mouth at bedtime.     calcium-vitamin D (OSCAL WITH D) 500-200 MG-UNIT tablet Take 1 tablet by mouth daily with breakfast.     cetirizine (ZYRTEC) 10 MG tablet Take 10 mg by mouth daily.     dextromethorphan-guaiFENesin (MUCINEX DM) 30-600 MG 12hr tablet Take 1 tablet by mouth 2 (two) times daily as needed for cough.     donepezil (ARICEPT) 10 MG tablet Take 1 tablet by mouth at bedtime.     fluticasone (FLONASE) 50 MCG/ACT nasal spray Place 1 spray into the nose daily as needed for allergies.      simvastatin (ZOCOR) 20 MG tablet Take 20 mg by mouth every morning.     vitamin B-12 (CYANOCOBALAMIN) 100 MCG tablet Take 100 mcg by mouth daily.     No current facility-administered medications for this visit.   Review of Systems  Constitutional:  Negative for appetite change, chills, fatigue and fever.  HENT:   Positive for hearing loss. Negative for voice change.   Eyes:  Negative for eye problems.  Respiratory:  Negative for chest tightness and cough.   Cardiovascular:  Negative for chest pain.  Gastrointestinal:  Negative for abdominal distention, abdominal pain and blood in stool.  Endocrine: Negative for hot flashes.  Genitourinary:   Negative for difficulty urinating and frequency.   Musculoskeletal:  Negative for arthralgias.       Chronic intermittent back pain  Skin:  Negative for itching and rash.  Neurological:  Negative for extremity weakness.  Hematological:  Negative for adenopathy.  Psychiatric/Behavioral:  Negative for confusion.     Performance status (ECOG): 0 - Asymptomatic  Vital Signs BP (!) 163/67 Comment: Rechecked BP  Pulse (!) 57   Temp (!) 96 F (35.6 C) (Tympanic)   Resp 18   Wt 128 lb 3.2 oz (58.2 kg)   SpO2 95%   BMI 23.45 kg/m   Physical Exam Constitutional:      General: She is not in acute distress.    Appearance: She is not diaphoretic.  HENT:  Head: Normocephalic and atraumatic.  Eyes:     General: No scleral icterus. Cardiovascular:     Rate and Rhythm: Normal rate and regular rhythm.  Pulmonary:     Effort: Pulmonary effort is normal. No respiratory distress.  Abdominal:     General: There is no distension.     Palpations: Abdomen is soft.     Tenderness: There is no abdominal tenderness.  Musculoskeletal:        General: Normal range of motion.     Cervical back: Normal range of motion and neck supple.  Skin:    General: Skin is dry.     Findings: No erythema.  Neurological:     Mental Status: She is alert and oriented to person, place, and time. Mental status is at baseline.  Psychiatric:        Mood and Affect: Mood and affect normal.     RADIOGRAPHIC STUDIES: I have personally reviewed the radiological images as listed and agreed with the findings in the report. MR Brain W Wo Contrast Result Date: 04/27/2023 CLINICAL DATA:  Lung cancer.  Staging. EXAM: MRI HEAD WITHOUT AND WITH CONTRAST TECHNIQUE: Multiplanar, multiecho pulse sequences of the brain and surrounding structures were obtained without and with intravenous contrast. CONTRAST:  5mL GADAVIST GADOBUTROL 1 MMOL/ML IV SOLN COMPARISON:  07/15/2015 FINDINGS: Brain: Diffusion imaging does not show any  acute or subacute infarction. Brainstem and cerebellum are normal. Cerebral hemispheres show minimal small vessel change of the white matter, less than often seen at this age. No cortical or large vessel territory infarction. No mass lesion, hemorrhage, hydrocephalus or extra-axial collection. Vascular: Major vessels at the base of the brain show flow. Persistent trigeminal artery on the left as seen on previous imaging. Redemonstration of a small aneurysm of the left supraclinoid ICA, not studied in detail. Skull and upper cervical spine: Negative Sinuses/Orbits: Clear/normal Other: None IMPRESSION: 1. No evidence of metastatic disease. Minimal small vessel change of the cerebral hemispheric white matter, less than often seen at this age. 2. Persistent trigeminal artery on the left as seen on previous imaging. Redemonstration of a small aneurysm of the left supraclinoid ICA, not studied in detail. Electronically Signed   By: Paulina Fusi M.D.   On: 04/27/2023 11:17   DG Chest Port 1 View Result Date: 04/19/2023 CLINICAL DATA:  Status post bronchoscopy. EXAM: PORTABLE CHEST 1 VIEW COMPARISON:  Chest radiograph dated 12/15/2022 and CT dated 03/18/2023. FINDINGS: No focal consolidation, pleural effusion, pneumothorax. Surgical suture in the left lower lung field. The cardiac silhouette is within normal limits. Atherosclerotic calcification of the aortic arch. No acute osseous pathology. IMPRESSION: No active disease. No pneumothorax. Electronically Signed   By: Elgie Collard M.D.   On: 04/19/2023 12:38   NM PET Image Initial (PI) Skull Base To Thigh Result Date: 04/05/2023 CLINICAL DATA:  Subsequent treatment strategy for lung carcinoma. Abnormal CT with enlarged pretracheal lymph CT. EXAM: NUCLEAR MEDICINE PET SKULL BASE TO THIGH TECHNIQUE: mCi F-18 FDG was injected intravenously. Full-ring PET imaging was performed from the skull base to thigh after the radiotracer. CT data was obtained and used for  attenuation correction and anatomic localization. Fasting blood glucose:  mg/dl COMPARISON:  None Available. FINDINGS: NECK: No hypermetabolic lymph nodes in the neck.  Is Incidental CT findings: None. CHEST: Postsurgical change in the RIGHT upper lobe. No hypermetabolic pulmonary nodules. The enlarged pretracheal nodule of concern measures 11 mm short axis (image 43/6) and has intense metabolic activity with SUV max  equal 10.1. No additional hypermetabolic mediastinal hilar nodes. No supraclavicular adenopathy. Incidental CT findings: Small nodule along the LEFT oblique fissure measuring 3 mm (image 56/6) has metabolic activity (SUV max equal 2.8) . This nodule was present on CT 03/16/2022 and similar size. Nodule also present on CT from 03/16/2021 and same size. ABDOMEN/PELVIS: No abnormal hypermetabolic activity within the liver, pancreas, adrenal glands, or spleen. No hypermetabolic lymph nodes in the abdomen or pelvis. Incidental CT findings: LEFT hydronephrosis. Double-J ureteral stent on the LEFT. The proximal stent is in the dilated LEFT renal pelvis. There is evidence of urine excretion along the course of the LEFT ureter. SKELETON: No focal hypermetabolic activity to suggest skeletal metastasis. Incidental CT findings: None. IMPRESSION: 1. Hypermetabolic pretracheal lymph node is concerning for metastatic adenopathy. 2. No evidence of local recurrence in the RIGHT upper lobe. 3. No evidence of metastatic disease in the abdomen pelvis. 4. Small nodule along the LEFT oblique fissure has metabolic activity; however, nodule has been stable in size since 03/16/2021. Favor benign nodule. 5. LEFT hydronephrosis with double-J ureteral stent in place. Electronically Signed   By: Genevive Bi M.D.   On: 04/05/2023 11:35   CT Chest Wo Contrast Result Date: 03/22/2023 CLINICAL DATA:  History of stage I B right upper lobe adenocarcinoma. Status post right wedge resection. * Tracking Code: BO * EXAM: CT CHEST  WITHOUT CONTRAST TECHNIQUE: Multidetector CT imaging of the chest was performed following the standard protocol without IV contrast. RADIATION DOSE REDUCTION: This exam was performed according to the departmental dose-optimization program which includes automated exposure control, adjustment of the mA and/or kV according to patient size and/or use of iterative reconstruction technique. COMPARISON:  X-ray 12/15/2022.  CT scan 03/16/2022 and older. FINDINGS: Cardiovascular: On this non IV contrast exam the thoracic aorta has a normal course and caliber with moderate calcified atherosclerotic plaque. Calcified plaque also extends along the great vessels as well as along the coronary arteries. Heart is nonenlarged. No significant pericardial effusion. There is some enlargement of the main pulmonary artery. Please correlate for evidence of pulmonary hypertension. Mediastinum/Nodes: Small thyroid gland. Slightly patulous thoracic esophagus with small hiatal hernia. Unchanged from previous. On this non IV contrast exam there is no specific abnormal lymph node enlargement identified in the axillary regions or hila. Enlarged pretracheal lymph node identified on series 2, image 49 today measuring 19 by 13 mm. Previously 16 x 10 mm. Smaller on older examinations as well. Slightly larger than previous. There are no other clear abnormal nodal areas of enlargement in the mediastinum. Lungs/Pleura: Stable surgical changes along left lower lobe with wedge resection. Areas of scarring and fibrotic changes. Other areas of scarring atelectatic changes are also seen. Apical pleural thickening. Surgical is also seen along the anteromedial right upper lobe. No consolidation or pneumothorax. Diffuse centrilobular emphysematous changes. There is a focal irregular apical nodule on the left previously measuring 6 x 7 mm. Today this area is slightly less nodular but there continues to be some distortion. Area is measured at 6 x 5 mm on series  4, image 26 today. The ground-glass nodular area in the posteromedial right lower lobe which previously measured 13 x 11 mm, today is similar in size and appearance on series 4, image 77. 3 mm middle lobe nodule stable today on series 4, image 89. There is also a nodule in the right upper lobe posterior to the lung hilum measuring 8 by 5 mm on series 4, image 50. This is unchanged in  retrospect. This has been present and stable since at least August 2022 demonstrating long-term stability. No new dominant lung nodule identified. Upper Abdomen: Adrenal glands are preserved in the upper abdomen. Once again there is an extrarenal pelvis left kidney, unchanged from previous with some ectasia. Benign-appearing hepatic cystic foci. Musculoskeletal: Scattered degenerative changes along the spine. IMPRESSION: Mildly enlarged pre tracheal lymph node is slightly increased in size today from previous. Recommend additional PET-CT evaluation as clinically appropriate. Alternatively, a follow-up in 3 months could be considered with the slow interval growth. Stable bilateral areas of lung nodularity and postsurgical changes. Aortic Atherosclerosis (ICD10-I70.0) and Emphysema (ICD10-J43.9). Electronically Signed   By: Karen Kays M.D.   On: 03/22/2023 20:40    Laboratory results were reviewed by me    Latest Ref Rng & Units 03/22/2023   12:31 PM 03/18/2022   10:02 AM 02/25/2022   10:49 AM  CBC  WBC 4.0 - 10.5 K/uL 8.2  6.8  6.0   Hemoglobin 12.0 - 15.0 g/dL 30.8  65.7  84.6   Hematocrit 36.0 - 46.0 % 46.8  45.5  43.8   Platelets 150 - 400 K/uL 224  153  203       Latest Ref Rng & Units 03/22/2023   12:30 PM 03/18/2022   10:02 AM 02/25/2022   10:49 AM  CMP  Glucose 70 - 99 mg/dL 87  962  88   BUN 8 - 23 mg/dL 21  23  29    Creatinine 0.44 - 1.00 mg/dL 9.52  8.41  3.24   Sodium 135 - 145 mmol/L 140  141  140   Potassium 3.5 - 5.1 mmol/L 4.6  4.4  3.9   Chloride 98 - 111 mmol/L 104  105  106   CO2 22 - 32 mmol/L 26   28  27    Calcium 8.9 - 10.3 mg/dL 9.5  9.7  9.2   Total Protein 6.5 - 8.1 g/dL 7.1  7.2    Total Bilirubin 0.0 - 1.2 mg/dL 0.7  0.8    Alkaline Phos 38 - 126 U/L 59  54    AST 15 - 41 U/L 22  24    ALT 0 - 44 U/L 17  20

## 2023-04-27 NOTE — Assessment & Plan Note (Signed)
 JAK2 V617F mutation negative, with reflex to other mutations CALR, MPL, JAK 2 Ex 12-15 mutations negative. Negative BCR ABL. Most likely this is secondary erythrocytosis.  I suspect that this may be secondary to hypoxia due to underlying chronic lung diseases/emphysema or sleep apnea.  No need for phlebotomy given that hematocrit is less than 52. She declines sleep apnea work up

## 2023-04-27 NOTE — Progress Notes (Signed)
 Brain MRI results reviewed with patient over the phone. Dr. Cathie Hoops made aware. Nothing further needed at this time.

## 2023-04-28 ENCOUNTER — Other Ambulatory Visit: Payer: Self-pay

## 2023-05-02 ENCOUNTER — Other Ambulatory Visit: Payer: Self-pay | Admitting: Oncology

## 2023-05-02 ENCOUNTER — Ambulatory Visit
Admission: RE | Admit: 2023-05-02 | Discharge: 2023-05-02 | Disposition: A | Discharge status: Home / Self Care | Source: Ambulatory Visit | Attending: Radiation Oncology | Admitting: Radiation Oncology

## 2023-05-02 ENCOUNTER — Encounter: Payer: Self-pay | Admitting: *Deleted

## 2023-05-02 DIAGNOSIS — C3432 Malignant neoplasm of lower lobe, left bronchus or lung: Secondary | ICD-10-CM

## 2023-05-02 DIAGNOSIS — C3411 Malignant neoplasm of upper lobe, right bronchus or lung: Secondary | ICD-10-CM

## 2023-05-02 NOTE — Progress Notes (Signed)
 Met with patient during CT simulation appt today. Reviewed upcoming appts to start treatment. All questions answered during visit. Instructed to call with any further questions or needs. Pt verbalized understanding.

## 2023-05-03 ENCOUNTER — Inpatient Hospital Stay

## 2023-05-04 ENCOUNTER — Encounter: Payer: Self-pay | Admitting: Oncology

## 2023-05-06 NOTE — Progress Notes (Signed)
 Pharmacist Chemotherapy Monitoring - Initial Assessment    Anticipated start date: 05/12/23  The following has been reviewed per standard work regarding the patient's treatment regimen: The patient's diagnosis, treatment plan and drug doses, and organ/hematologic function Lab orders and baseline tests specific to treatment regimen  The treatment plan start date, drug sequencing, and pre-medications Prior authorization status  Patient's documented medication list, including drug-drug interaction screen and prescriptions for anti-emetics and supportive care specific to the treatment regimen The drug concentrations, fluid compatibility, administration routes, and timing of the medications to be used The patient's access for treatment and lifetime cumulative dose history, if applicable  The patient's medication allergies and previous infusion related reactions, if applicable   Changes made to treatment plan:  N/A  Follow up needed:  N/A   Sharen Hones, PharmD, BCPS Clinical Pharmacist   05/06/2023  2:00 PM

## 2023-05-10 LAB — CULTURE, FUNGUS WITHOUT SMEAR

## 2023-05-11 ENCOUNTER — Ambulatory Visit

## 2023-05-11 ENCOUNTER — Telehealth: Payer: Self-pay | Admitting: *Deleted

## 2023-05-11 ENCOUNTER — Other Ambulatory Visit: Payer: Self-pay | Admitting: Oncology

## 2023-05-11 DIAGNOSIS — C3411 Malignant neoplasm of upper lobe, right bronchus or lung: Secondary | ICD-10-CM | POA: Diagnosis not present

## 2023-05-11 DIAGNOSIS — C3432 Malignant neoplasm of lower lobe, left bronchus or lung: Secondary | ICD-10-CM

## 2023-05-11 NOTE — Telephone Encounter (Signed)
 I called back to the number and no answer and I asked to call back about what med.

## 2023-05-12 ENCOUNTER — Ambulatory Visit

## 2023-05-12 ENCOUNTER — Ambulatory Visit
Admission: RE | Admit: 2023-05-12 | Discharge: 2023-05-12 | Disposition: A | Discharge status: Home / Self Care | Source: Ambulatory Visit | Attending: Radiation Oncology | Admitting: Radiation Oncology

## 2023-05-12 ENCOUNTER — Inpatient Hospital Stay

## 2023-05-12 ENCOUNTER — Inpatient Hospital Stay: Admitting: Oncology

## 2023-05-13 ENCOUNTER — Ambulatory Visit

## 2023-05-13 ENCOUNTER — Encounter: Payer: Self-pay | Admitting: Oncology

## 2023-05-16 ENCOUNTER — Other Ambulatory Visit: Payer: Self-pay

## 2023-05-16 ENCOUNTER — Inpatient Hospital Stay: Admitting: Oncology

## 2023-05-16 ENCOUNTER — Inpatient Hospital Stay

## 2023-05-16 ENCOUNTER — Ambulatory Visit
Admission: RE | Admit: 2023-05-16 | Discharge: 2023-05-16 | Disposition: A | Discharge status: Home / Self Care | Source: Ambulatory Visit | Attending: Radiation Oncology | Admitting: Radiation Oncology

## 2023-05-16 ENCOUNTER — Encounter: Payer: Self-pay | Admitting: Oncology

## 2023-05-16 ENCOUNTER — Encounter: Payer: Self-pay | Admitting: *Deleted

## 2023-05-16 VITALS — BP 161/71 | HR 54 | Temp 97.8°F | Resp 18 | Wt 127.2 lb

## 2023-05-16 VITALS — BP 178/76 | HR 54 | Resp 18

## 2023-05-16 DIAGNOSIS — C3411 Malignant neoplasm of upper lobe, right bronchus or lung: Secondary | ICD-10-CM

## 2023-05-16 DIAGNOSIS — C3432 Malignant neoplasm of lower lobe, left bronchus or lung: Secondary | ICD-10-CM | POA: Diagnosis not present

## 2023-05-16 DIAGNOSIS — Z5111 Encounter for antineoplastic chemotherapy: Secondary | ICD-10-CM | POA: Insufficient documentation

## 2023-05-16 DIAGNOSIS — D751 Secondary polycythemia: Secondary | ICD-10-CM | POA: Diagnosis not present

## 2023-05-16 LAB — CMP (CANCER CENTER ONLY)
ALT: 14 U/L (ref 0–44)
AST: 22 U/L (ref 15–41)
Albumin: 3.9 g/dL (ref 3.5–5.0)
Alkaline Phosphatase: 69 U/L (ref 38–126)
Anion gap: 8 (ref 5–15)
BUN: 16 mg/dL (ref 8–23)
CO2: 28 mmol/L (ref 22–32)
Calcium: 9.2 mg/dL (ref 8.9–10.3)
Chloride: 106 mmol/L (ref 98–111)
Creatinine: 0.86 mg/dL (ref 0.44–1.00)
GFR, Estimated: >60 mL/min (ref >60)
Glucose, Bld: 95 mg/dL (ref 70–99)
Potassium: 4.1 mmol/L (ref 3.5–5.1)
Sodium: 142 mmol/L (ref 135–145)
Total Bilirubin: 0.7 mg/dL (ref 0.0–1.2)
Total Protein: 6.8 g/dL (ref 6.5–8.1)

## 2023-05-16 LAB — CBC WITH DIFFERENTIAL (CANCER CENTER ONLY)
Abs Immature Granulocytes: 0.01 10*3/uL (ref 0.00–0.07)
Basophils Absolute: 0 10*3/uL (ref 0.0–0.1)
Basophils Relative: 1 %
Eosinophils Absolute: 0.2 10*3/uL (ref 0.0–0.5)
Eosinophils Relative: 3 %
HCT: 46.2 % — ABNORMAL HIGH (ref 36.0–46.0)
Hemoglobin: 15.1 g/dL — ABNORMAL HIGH (ref 12.0–15.0)
Immature Granulocytes: 0 %
Lymphocytes Relative: 25 %
Lymphs Abs: 1.6 10*3/uL (ref 0.7–4.0)
MCH: 32.9 pg (ref 26.0–34.0)
MCHC: 32.7 g/dL (ref 30.0–36.0)
MCV: 100.7 fL — ABNORMAL HIGH (ref 80.0–100.0)
Monocytes Absolute: 0.6 10*3/uL (ref 0.1–1.0)
Monocytes Relative: 9 %
Neutro Abs: 4.1 10*3/uL (ref 1.7–7.7)
Neutrophils Relative %: 62 %
Platelet Count: 170 10*3/uL (ref 150–400)
RBC: 4.59 MIL/uL (ref 3.87–5.11)
RDW: 12.5 % (ref 11.5–15.5)
WBC Count: 6.5 10*3/uL (ref 4.0–10.5)
nRBC: 0 % (ref 0.0–0.2)

## 2023-05-16 LAB — RAD ONC ARIA SESSION SUMMARY
Course Elapsed Days: 0
Plan Fractions Treated to Date: 1
Plan Prescribed Dose Per Fraction: 2 Gy
Plan Total Fractions Prescribed: 33
Plan Total Prescribed Dose: 66 Gy
Reference Point Dosage Given to Date: 2 Gy
Reference Point Session Dosage Given: 2 Gy
Session Number: 1

## 2023-05-16 MED ORDER — PACLITAXEL CHEMO INJECTION 300 MG/50ML
50.0000 mg/m2 | Freq: Once | INTRAVENOUS | Status: AC
  Administered 2023-05-16: 78 mg via INTRAVENOUS
  Filled 2023-05-16: qty 13

## 2023-05-16 MED ORDER — CARBOPLATIN CHEMO INJECTION 450 MG/45ML
134.0000 mg | Freq: Once | INTRAVENOUS | Status: AC
  Administered 2023-05-16: 130 mg via INTRAVENOUS
  Filled 2023-05-16: qty 13

## 2023-05-16 MED ORDER — DIPHENHYDRAMINE HCL 50 MG/ML IJ SOLN
50.0000 mg | Freq: Once | INTRAMUSCULAR | Status: AC
  Administered 2023-05-16: 50 mg via INTRAVENOUS
  Filled 2023-05-16: qty 1

## 2023-05-16 MED ORDER — DEXAMETHASONE SODIUM PHOSPHATE 10 MG/ML IJ SOLN
10.0000 mg | Freq: Once | INTRAMUSCULAR | Status: AC
  Administered 2023-05-16: 10 mg via INTRAVENOUS
  Filled 2023-05-16: qty 1

## 2023-05-16 MED ORDER — HEPARIN SOD (PORK) LOCK FLUSH 100 UNIT/ML IV SOLN
500.0000 [IU] | Freq: Once | INTRAVENOUS | Status: DC | PRN
  Filled 2023-05-16: qty 5

## 2023-05-16 MED ORDER — FAMOTIDINE IN NACL 20-0.9 MG/50ML-% IV SOLN
20.0000 mg | Freq: Once | INTRAVENOUS | Status: AC
Start: 2023-05-16 — End: 2023-05-16
  Administered 2023-05-16: 20 mg via INTRAVENOUS
  Filled 2023-05-16: qty 50

## 2023-05-16 MED ORDER — PALONOSETRON HCL INJECTION 0.25 MG/5ML
0.2500 mg | Freq: Once | INTRAVENOUS | Status: AC
  Administered 2023-05-16: 0.25 mg via INTRAVENOUS
  Filled 2023-05-16: qty 5

## 2023-05-16 MED ORDER — SODIUM CHLORIDE 0.9 % IV SOLN
INTRAVENOUS | Status: DC
Start: 2023-05-16 — End: 2023-05-16
  Filled 2023-05-16: qty 250

## 2023-05-16 NOTE — Progress Notes (Signed)
 Dr Wilhelmenia Harada aware of elvated BP's. Ok to discharge, pt to check at home and call if any issues.

## 2023-05-16 NOTE — Progress Notes (Signed)
 Hematology/Oncology Progress note [phone] [phone]     Chief Complaint:  Follow up for recurrent non small cell lung cancer   ASSESSMENT & PLAN:  Heather [name] is a 88 y.o. 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 T2aN1M0 (stage IB).  She is s/p wedge resection of a left lower lobe mass on 03/10/2017. T1aNx (stage IA). No with locally recurrent NSCLC   Malignant neoplasm of lower lobe of left lung (HCC) #History of stage Ib right lung upper lobe adenocarcinoma status post wedge resection in 2016 History of left lower lobe adenocarcinoma T1 a NX status post wedge resection 2019. Recurrence - Biopsy of pretracheal lymph node positive for NSCLC.  MRI brain is negative.  Recommend concurrent chemotherapy [carboplatin  taxol ]  with radiation.  Labs are reviewed and discussed with patient. Proceed with cycle 1 carboplatin  AUC2, taxol  45mg /m2.  Rationale and side effects were reviewed with patient and husband in details.  We reviewed the antiemetics instruction in details.  She has allergic reaction to cortisone injection.  Dexamethasone  is in her premed, discussed about the rationale of Dexamethasone  and potential overlapping allergic reaction. She gets benadryl  and pepcid  as well for premed. She is willing to try Dexamethasone .    Erythrocytosis JAK2 V617F mutation negative, with reflex to other mutations CALR, MPL, JAK 2 Ex 12-15 mutations negative.Negative BCR ABL. Most likely this is secondary erythrocytosis.  I suspect that this may be secondary to hypoxia due to underlying chronic lung diseases/emphysema or sleep apnea.  No need for phlebotomy given that hematocrit is less than 52. She declines sleep apnea work up  Primary cancer of right upper lobe of lung [facility]) Same plan as above.   Encounter for antineoplastic chemotherapy Chemotherapy plan as listed above.    Orders Placed This Encounter   Procedures   CBC with Differential (Cancer Center Only)    Standing Status:   Future    Expected Date:   05/30/2023    Expiration Date:   05/29/2024   CMP (Cancer Center only)    Standing Status:   Future    Expected Date:   05/30/2023    Expiration Date:   05/29/2024   CBC with Differential (Cancer Center Only)    Standing Status:   Future    Expected Date:   06/06/2023    Expiration Date:   06/05/2024   CMP (Cancer Center only)    Standing Status:   Future    Expected Date:   06/06/2023    Expiration Date:   06/05/2024   CBC with Differential (Cancer Center Only)    Standing Status:   Future    Expected Date:   06/13/2023    Expiration Date:   06/12/2024   CMP (Cancer Center only)    Standing Status:   Future    Expected Date:   06/13/2023    Expiration Date:   06/12/2024   CBC with Differential (Cancer Center Only)    Standing Status:   Future    Expected Date:   06/20/2023    Expiration Date:   06/19/2024   CMP (Cancer Center only)    Standing Status:   Future    Expected Date:   06/20/2023    Expiration Date:   06/19/2024   Follow up 1 week.   All questions were answered. The patient knows to call the clinic with any problems, questions or concerns.  We spent sufficient time to discuss many  aspect of care, questions were answered to patient's satisfaction.   Timmy Forbes, MD, PhD [facility] Hematology Oncology 05/16/2023   PERTINENT ONCOLOGY HISTORY Previously follows up with Dr.Corcoran. [place]bl[place] care with me on 03/15/2018  PET scan on 02/13/2014 revealed a 1 cm right upper lobe hypermetabolic nodule (SUV 3.8) and a 6 mm right upper lobe nodule (no metabolic activity). There was no mediastianl adenopathy.  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 Pathology revealed a 1.1 cm high-grade adenocarcinoma with pleural invasion. Nodes were negative. Pathologic stage was T2aN1M0 (stage IB).  Chest CT on 02/10/2017 revealed clear interval  progression of the posterior left lower lobe pulmonary nodule, now measuring 12 mm in long axis and having a distinctly lobular contour. Imaging features were very concerning for neoplasm.  There was no change in the 7 mm nodule posterior to the right hilum.  PET scan on 02/18/2017 revealed a 11 x 8 mm posterior left lower lobe pulmonary nodule that had mildly progressed from prior studies and demonstrated mild hypermetabolism (SUV 1.6).  The appearance was considered worrisome for an indolent primary bronchogenic neoplasm.  03/10/2017.  s/p wedge resection of a left lower lobe mass.   Pathology revealed an 8 mm invasive adenocarcinoma with solid and acinar patterns.  There was no visceral invasion.  There was no lymphovascular invasion.  All margins were negative.  Pathologic stage was T1aNx (stage IA).   INTERVAL HISTORY Heather [name] is a 88 y.o. female who has above history reviewed by me today presents for follow up visit for management of history of lung cancer, 6 months assessment.  Patient reports feeling well.  She was accompanied by her husband. Denies shortness of breath, fatigue, hemoptysis, unintentional weight loss. No new complains History of allergic reaction to cortisone shots. Patient reports that she is able to take prednisone on and off.    Past Medical History:  Diagnosis Date   Anginal pain (HCC)    Benign essential hypertension    Brain aneurysm    Carotid artery stenosis    Carotid stenosis 06/19/2013   Colonic polyp    Emphysema lung (HCC)    GERD (gastroesophageal reflux disease)    Hiatal hernia    Hypercholesteremia    Hyperlipidemia, mixed 06/09/2015   Lung cancer (HCC) 2016   Lung nodule 06/21/2014   Macrocytic 10/06/2014   Multiple thyroid  nodules    PAT (paroxysmal atrial tachycardia) (HCC) 06/19/2013   Plantar fasciitis     Past Surgical History:  Procedure Laterality Date   BREAST EXCISIONAL BIOPSY Right 40 yrs ago   neg   BREAST LUMPECTOMY      benign   CATARACT EXTRACTION W/ INTRAOCULAR LENS  IMPLANT, BILATERAL Bilateral    CEREBRAL ANEURYSM REPAIR  2004   coil treatment   CERVIX LESION DESTRUCTION     COLONOSCOPY     1995, 2006, 2014   COLONOSCOPY WITH PROPOFOL  N/A 03/31/2015   Procedure: COLONOSCOPY WITH PROPOFOL ;  Surgeon: Cassie Click, MD;  Location: [facility] ENDOSCOPY;  Service: Endoscopy;  Laterality: N/A;   CYSTOSCOPY W/ RETROGRADES Left 03/02/2022   Procedure: CYSTOSCOPY WITH RETROGRADE PYELOGRAM;  Surgeon: Geraline Knapp, MD;  Location: [facility] ORS;  Service: Urology;  Laterality: Left;   CYSTOSCOPY W/ RETROGRADES Left 12/28/2022   Procedure: CYSTOSCOPY WITH RETROGRADE PYELOGRAM;  Surgeon: Geraline Knapp, MD;  Location: [facility] ORS;  Service: Urology;  Laterality: Left;   CYSTOSCOPY W/ URETERAL STENT PLACEMENT Left 03/03/2021  Procedure: CYSTOSCOPY WITH EXCHANGE;  Surgeon: Geraline Knapp, MD;  Location: [facility] ORS;  Service: Urology;  Laterality: Left;   CYSTOSCOPY W/ URETERAL STENT PLACEMENT Left 03/02/2022   Procedure: CYSTOSCOPY WITH STENT EXCHANGE;  Surgeon: Geraline Knapp, MD;  Location: [facility] ORS;  Service: Urology;  Laterality: Left;   CYSTOSCOPY W/ URETERAL STENT PLACEMENT Left 12/28/2022   Procedure: CYSTOSCOPY WITH STENT EXCHANGE;  Surgeon: Geraline Knapp, MD;  Location: [facility] ORS;  Service: Urology;  Laterality: Left;   CYSTOSCOPY WITH STENT PLACEMENT Left 11/25/2020   Procedure: CYSTOSCOPY WITH STENT PLACEMENT;  Surgeon: Geraline Knapp, MD;  Location: [facility] ORS;  Service: Urology;  Laterality: Left;   ENDOBRONCHIAL ULTRASOUND Bilateral 04/19/2023   Procedure: ENDOBRONCHIAL ULTRASOUND (EBUS);  Surgeon: Vergia Glasgow, MD;  Location: [facility] ORS;  Service: Pulmonary;  Laterality: Bilateral;   FLEXIBLE BRONCHOSCOPY N/A 03/10/2017   Procedure: FLEXIBLE BRONCHOSCOPY;  Surgeon: Petra Brandy, MD;  Location: [facility] ORS;  Service: Thoracic;  Laterality: N/A;   THORACOSCOPY WITH WEDGE RESECTION LUNG Right 03/13/2014    RUL   THORACOTOMY/LOBECTOMY Left 03/10/2017   Procedure: THORACOTOMY WITH LUNG WEDGE RESECTION POSSIBLE LOBECTOMY;  Surgeon: Petra Brandy, MD;  Location: [facility] ORS;  Service: Thoracic;  Laterality: Left;   TUBAL LIGATION      Family History  Problem Relation Age of Onset   Alcohol abuse Mother    Heart attack Father    Breast cancer Neg Hx     Social History:  reports that she quit smoking about 34 years ago. Her smoking use included cigarettes. She started smoking about 69 years ago. She has a 35 pack-year smoking history. She has never used smokeless tobacco. She reports current alcohol use. She reports that she does not use drugs.   Allergies:  Allergies  Allergen Reactions   Ace Inhibitors Swelling   Contrast Media [Iodinated Contrast Media]    Cortisone     Patient had facial swelling and itching from cortisone injection IM    Current Medications: Current Outpatient Medications  Medication Sig Dispense Refill   amLODipine (NORVASC) 5 MG tablet Take 5 mg by mouth at bedtime.     amoxicillin  (AMOXIL ) 875 MG tablet Take 875 mg by mouth as needed.     atenolol  (TENORMIN ) 50 MG tablet Take 50 mg by mouth at bedtime.     calcium -vitamin D  (OSCAL WITH D) 500-200 MG-UNIT tablet Take 1 tablet by mouth daily with breakfast.     cetirizine (ZYRTEC) 10 MG tablet Take 10 mg by mouth daily.     dextromethorphan-guaiFENesin (MUCINEX DM) 30-600 MG 12hr tablet Take 1 tablet by mouth 2 (two) times daily as needed for cough.     donepezil (ARICEPT) 10 MG tablet Take 1 tablet by mouth at bedtime.     fluticasone  (FLONASE ) 50 MCG/ACT nasal spray Place 1 spray into the nose daily as needed for allergies.      ondansetron  (ZOFRAN ) 8 MG tablet Take 1 tablet (8 mg total) by mouth every 8 (eight) hours as needed for nausea or vomiting. Start on the third day after chemotherapy. 30 tablet 1   prochlorperazine  (COMPAZINE ) 10 MG tablet Take 1 tablet (10 mg total) by mouth every 6 (six) hours as needed for  nausea or vomiting. 30 tablet 1   simvastatin  (ZOCOR ) 20 MG tablet Take 20 mg by mouth every morning.     vitamin B-12 (CYANOCOBALAMIN ) 100 MCG tablet Take 100 mcg by mouth daily.     No current facility-administered medications for this  visit.   Facility-Administered Medications Ordered in Other Visits  Medication Dose Route Frequency Provider Last Rate Last Admin   0.9 %  sodium chloride  infusion   Intravenous Continuous Timmy Forbes, MD 10 mL/hr at 05/16/23 1011 New Bag at 05/16/23 1011   CARBOplatin  (PARAPLATIN ) 130 mg in sodium chloride  0.9 % 100 mL chemo infusion  130 mg Intravenous Once Timmy Forbes, MD       dexamethasone  (DECADRON ) injection 10 mg  10 mg Intravenous Once Jalissa Heinzelman, MD       diphenhydrAMINE  (BENADRYL ) injection 50 mg  50 mg Intravenous Once Timmy Forbes, MD       heparin  lock flush 100 unit/mL  500 Units Intracatheter Once PRN Timmy Forbes, MD       PACLitaxel  (TAXOL ) 78 mg in sodium chloride  0.9 % 250 mL chemo infusion (</= 80mg /m2)  50 mg/m2 (Treatment Plan Recorded) Intravenous Once Timmy Forbes, MD       palonosetron  (ALOXI ) injection 0.25 mg  0.25 mg Intravenous Once Timmy Forbes, MD       Review of Systems  Constitutional:  Negative for appetite change, chills, fatigue and fever.  HENT:   Positive for hearing loss. Negative for voice change.   Eyes:  Negative for eye problems.  Respiratory:  Negative for chest tightness and cough.   Cardiovascular:  Negative for chest pain.  Gastrointestinal:  Negative for abdominal distention, abdominal pain and blood in stool.  Endocrine: Negative for hot flashes.  Genitourinary:  Negative for difficulty urinating and frequency.   Musculoskeletal:  Negative for arthralgias.       Chronic intermittent back pain  Skin:  Negative for itching and rash.  Neurological:  Negative for extremity weakness.  Hematological:  Negative for adenopathy.  Psychiatric/Behavioral:  Negative for confusion.     Performance status (ECOG): 0 - Asymptomatic  Vital  Signs BP (!) 161/71 Comment: Rechecked BP. Patient is anxious today about first chemo.  Pulse (!) 54   Temp 97.8 F (36.6 C) (Tympanic)   Resp 18   Wt 127 lb 3.2 oz (57.7 kg)   SpO2 96%   BMI 23.27 kg/m   Physical Exam Constitutional:      General: She is not in acute distress.    Appearance: She is not diaphoretic.  HENT:     Head: Normocephalic and atraumatic.  Eyes:     General: No scleral icterus. Cardiovascular:     Rate and Rhythm: Normal rate and regular rhythm.  Pulmonary:     Effort: Pulmonary effort is normal. No respiratory distress.  Abdominal:     General: There is no distension.     Palpations: Abdomen is soft.     Tenderness: There is no abdominal tenderness.  Musculoskeletal:        General: Normal range of motion.     Cervical back: Normal range of motion and neck supple.  Skin:    General: Skin is dry.     Findings: No erythema.  Neurological:     Mental Status: She is alert and oriented to person, place, and time. Mental status is at baseline.  Psychiatric:        Mood and Affect: Mood and affect normal.     RADIOGRAPHIC STUDIES: I have personally reviewed the radiological images as listed and agreed with the findings in the report. MR Brain W Wo Contrast Result Date: 04/27/2023 CLINICAL DATA:  Lung cancer.  Staging. EXAM: MRI HEAD WITHOUT AND WITH CONTRAST TECHNIQUE: Multiplanar, multiecho pulse sequences of  the brain and surrounding structures were obtained without and with intravenous contrast. CONTRAST:  5mL GADAVIST  GADOBUTROL  1 MMOL/ML IV SOLN COMPARISON:  07/15/2015 FINDINGS: Brain: Diffusion imaging does not show any acute or subacute infarction. Brainstem and cerebellum are normal. Cerebral hemispheres show minimal small vessel change of the white matter, less than often seen at this age. No cortical or large vessel territory infarction. No mass lesion, hemorrhage, hydrocephalus or extra-axial collection. Vascular: Major vessels at the base of the  brain show flow. Persistent trigeminal artery on the left as seen on previous imaging. Redemonstration of a small aneurysm of the left supraclinoid ICA, not studied in detail. Skull and upper cervical spine: Negative Sinuses/Orbits: Clear/normal Other: None IMPRESSION: 1. No evidence of metastatic disease. Minimal small vessel change of the cerebral hemispheric white matter, less than often seen at this age. 2. Persistent trigeminal artery on the left as seen on previous imaging. Redemonstration of a small aneurysm of the left supraclinoid ICA, not studied in detail. Electronically Signed   By: Bettylou Brunner M.D.   On: 04/27/2023 11:17   DG Chest Port 1 View Result Date: 04/19/2023 CLINICAL DATA:  Status post bronchoscopy. EXAM: PORTABLE CHEST 1 VIEW COMPARISON:  Chest radiograph dated 12/15/2022 and CT dated 03/18/2023. FINDINGS: No focal consolidation, pleural effusion, pneumothorax. Surgical suture in the left lower lung field. The cardiac silhouette is within normal limits. Atherosclerotic calcification of the aortic arch. No acute osseous pathology. IMPRESSION: No active disease. No pneumothorax. Electronically Signed   By: Angus Bark M.D.   On: 04/19/2023 12:38   NM PET Image Initial (PI) Skull Base To Thigh Result Date: 04/05/2023 CLINICAL DATA:  Subsequent treatment strategy for lung carcinoma. Abnormal CT with enlarged pretracheal lymph CT. EXAM: NUCLEAR MEDICINE PET SKULL BASE TO THIGH TECHNIQUE: mCi F-18 FDG was injected intravenously. Full-ring PET imaging was performed from the skull base to thigh after the radiotracer. CT data was obtained and used for attenuation correction and anatomic localization. Fasting blood glucose:  mg/dl COMPARISON:  None Available. FINDINGS: NECK: No hypermetabolic lymph nodes in the neck.  Is Incidental CT findings: None. CHEST: Postsurgical change in the RIGHT upper lobe. No hypermetabolic pulmonary nodules. The enlarged pretracheal nodule of concern measures 11  mm short axis (image 43/6) and has intense metabolic activity with SUV max equal 10.1. No additional hypermetabolic mediastinal hilar nodes. No supraclavicular adenopathy. Incidental CT findings: Small nodule along the LEFT oblique fissure measuring 3 mm (image 56/6) has metabolic activity (SUV max equal 2.8) . This nodule was present on CT 03/16/2022 and similar size. Nodule also present on CT from 03/16/2021 and same size. ABDOMEN/PELVIS: No abnormal hypermetabolic activity within the liver, pancreas, adrenal glands, or spleen. No hypermetabolic lymph nodes in the abdomen or pelvis. Incidental CT findings: LEFT hydronephrosis. Double-J ureteral stent on the LEFT. The proximal stent is in the dilated LEFT renal pelvis. There is evidence of urine excretion along the course of the LEFT ureter. SKELETON: No focal hypermetabolic activity to suggest skeletal metastasis. Incidental CT findings: None. IMPRESSION: 1. Hypermetabolic pretracheal lymph node is concerning for metastatic adenopathy. 2. No evidence of local recurrence in the RIGHT upper lobe. 3. No evidence of metastatic disease in the abdomen pelvis. 4. Small nodule along the LEFT oblique fissure has metabolic activity; however, nodule has been stable in size since 03/16/2021. Favor benign nodule. 5. LEFT hydronephrosis with double-J ureteral stent in place. Electronically Signed   By: Deboraha Fallow M.D.   On: 04/05/2023 11:35  CT Chest Wo Contrast Result Date: 03/22/2023 CLINICAL DATA:  History of stage I B right upper lobe adenocarcinoma. Status post right wedge resection. * Tracking Code: BO * EXAM: CT CHEST WITHOUT CONTRAST TECHNIQUE: Multidetector CT imaging of the chest was performed following the standard protocol without IV contrast. RADIATION DOSE REDUCTION: This exam was performed according to the departmental dose-optimization program which includes automated exposure control, adjustment of the mA and/or kV according to patient size and/or use  of iterative reconstruction technique. COMPARISON:  X-ray 12/15/2022.  CT scan 03/16/2022 and older. FINDINGS: Cardiovascular: On this non IV contrast exam the thoracic aorta has a normal course and caliber with moderate calcified atherosclerotic plaque. Calcified plaque also extends along the great vessels as well as along the coronary arteries. Heart is nonenlarged. No significant pericardial effusion. There is some enlargement of the main pulmonary artery. Please correlate for evidence of pulmonary hypertension. Mediastinum/Nodes: Small thyroid  gland. Slightly patulous thoracic esophagus with small hiatal hernia. Unchanged from previous. On this non IV contrast exam there is no specific abnormal lymph node enlargement identified in the axillary regions or hila. Enlarged pretracheal lymph node identified on series 2, image 49 today measuring 19 by 13 mm. Previously 16 x 10 mm. Smaller on older examinations as well. Slightly larger than previous. There are no other clear abnormal nodal areas of enlargement in the mediastinum. Lungs/Pleura: Stable surgical changes along left lower lobe with wedge resection. Areas of scarring and fibrotic changes. Other areas of scarring atelectatic changes are also seen. Apical pleural thickening. Surgical is also seen along the anteromedial right upper lobe. No consolidation or pneumothorax. Diffuse centrilobular emphysematous changes. There is a focal irregular apical nodule on the left previously measuring 6 x 7 mm. Today this area is slightly less nodular but there continues to be some distortion. Area is measured at 6 x 5 mm on series 4, image 26 today. The ground-glass nodular area in the posteromedial right lower lobe which previously measured 13 x 11 mm, today is similar in size and appearance on series 4, image 77. 3 mm middle lobe nodule stable today on series 4, image 89. There is also a nodule in the right upper lobe posterior to the lung hilum measuring 8 by 5 mm on  series 4, image 50. This is unchanged in retrospect. This has been present and stable since at least August 2022 demonstrating long-term stability. No new dominant lung nodule identified. Upper Abdomen: Adrenal glands are preserved in the upper abdomen. Once again there is an extrarenal pelvis left kidney, unchanged from previous with some ectasia. Benign-appearing hepatic cystic foci. Musculoskeletal: Scattered degenerative changes along the spine. IMPRESSION: Mildly enlarged pre tracheal lymph node is slightly increased in size today from previous. Recommend additional PET-CT evaluation as clinically appropriate. Alternatively, a follow-up in 3 months could be considered with the slow interval growth. Stable bilateral areas of lung nodularity and postsurgical changes. Aortic Atherosclerosis (ICD10-I70.0) and Emphysema (ICD10-J43.9). Electronically Signed   By: Adrianna Horde M.D.   On: 03/22/2023 20:40    Laboratory results were reviewed by me    Latest Ref Rng & Units 05/16/2023    8:39 AM 03/22/2023   12:31 PM 03/18/2022   10:02 AM  CBC  WBC 4.0 - 10.5 K/uL 6.5  8.2  6.8   Hemoglobin 12.0 - 15.0 g/dL 16.1  09.6  04.5   Hematocrit 36.0 - 46.0 % 46.2  46.8  45.5   Platelets 150 - 400 K/uL 170  224  153       Latest Ref Rng & Units 05/16/2023    8:39 AM 03/22/2023   12:30 PM 03/18/2022   10:02 AM  CMP  Glucose 70 - 99 mg/dL 95  87  161   BUN 8 - 23 mg/dL 16  21  23    Creatinine 0.44 - 1.00 mg/dL 0.96  0.45  4.09   Sodium 135 - 145 mmol/L 142  140  141   Potassium 3.5 - 5.1 mmol/L 4.1  4.6  4.4   Chloride 98 - 111 mmol/L 106  104  105   CO2 22 - 32 mmol/L 28  26  28    Calcium  8.9 - 10.3 mg/dL 9.2  9.5  9.7   Total Protein 6.5 - 8.1 g/dL 6.8  7.1  7.2   Total Bilirubin 0.0 - 1.2 mg/dL 0.7  0.7  0.8   Alkaline Phos 38 - 126 U/L 69  59  54   AST 15 - 41 U/L 22  22  24    ALT 0 - 44 U/L 14  17  20

## 2023-05-16 NOTE — Patient Instructions (Addendum)
 [facility] - A DEPT OF [facility]  Discharge Instructions: Thank you for choosing [facility] to provide your oncology and hematology care.  If you have a lab appointment with the Cancer Center, please go directly to the Cancer Center and check in at the registration area.  Wear comfortable clothing and clothing appropriate for easy access to any Portacath or PICC line.   We strive to give you quality time with your provider. You may need to reschedule your appointment if you arrive late (15 or more minutes).  Arriving late affects you and other patients whose appointments are after yours.  Also, if you miss three or more appointments without notifying the office, you may be dismissed from the clinic at the provider's discretion.      For prescription refill requests, have your pharmacy contact our office and allow 72 hours for refills to be completed.    Today you received the following chemotherapy and/or immunotherapy agents TAXOL  and CARBOPLATIN       To help prevent nausea and vomiting after your treatment, we encourage you to take your nausea medication as directed.  BELOW ARE SYMPTOMS THAT SHOULD BE REPORTED IMMEDIATELY: *FEVER GREATER THAN 100.4 F (38 C) OR HIGHER *CHILLS OR SWEATING *NAUSEA AND VOMITING THAT IS NOT CONTROLLED WITH YOUR NAUSEA MEDICATION *UNUSUAL SHORTNESS OF BREATH *UNUSUAL BRUISING OR BLEEDING *URINARY PROBLEMS (pain or burning when urinating, or frequent urination) *BOWEL PROBLEMS (unusual diarrhea, constipation, pain near the anus) TENDERNESS IN MOUTH AND THROAT WITH OR WITHOUT PRESENCE OF ULCERS (sore throat, sores in mouth, or a toothache) UNUSUAL RASH, SWELLING OR PAIN  UNUSUAL VAGINAL DISCHARGE OR ITCHING   Items with * indicate a potential emergency and should be followed up as soon as possible or go to the Emergency Department if any problems should occur.  Please show the CHEMOTHERAPY ALERT CARD or  IMMUNOTHERAPY ALERT CARD at check-in to the Emergency Department and triage nurse.  Should you have questions after your visit or need to cancel or reschedule your appointment, please contact [facility] - A DEPT OF [facility]  [phone] and follow the prompts.  Office hours are 8:00 a.m. to 4:30 p.m. Monday - Friday. Please note that voicemails left after 4:00 p.m. may not be returned until the following business day.  We are closed weekends and major holidays. You have access to a nurse at all times for urgent questions. Please call the main number to the clinic [phone] and follow the prompts.  For any non-urgent questions, you may also contact your provider using MyChart. We now offer e-Visits for anyone 53 and older to request care online for non-urgent symptoms. For details visit [email or web address].   Also download the MyChart app! Go to the app store, search "MyChart", open the app, select [facility], and log in with your MyChart username and password.  Paclitaxel  Injection What is this medication? PACLITAXEL  (PAK li TAX el) treats some types of cancer. It works by slowing down the growth of cancer cells. This medicine may be used for other purposes; ask your health care provider or pharmacist if you have questions. COMMON BRAND NAME(S): Onxol, Taxol  What should I tell my care team before I take this medication? They need to know if you have any of these conditions: Heart disease Liver disease Low white blood cell levels An unusual or allergic reaction to paclitaxel , other medications, foods, dyes, or preservatives If  you or your partner are pregnant or trying to get pregnant Breast-feeding How should I use this medication? This medication is injected into a vein. It is given by your care team in a hospital or clinic setting. Talk to your care team about the use of this medication in children. While it may be given to children for selected  conditions, precautions do apply. Overdosage: If you think you have taken too much of this medicine contact a poison control center or emergency room at once. NOTE: This medicine is only for you. Do not share this medicine with others. What if I miss a dose? Keep appointments for follow-up doses. It is important not to miss your dose. Call your care team if you are unable to keep an appointment. What may interact with this medication? Do not take this medication with any of the following: Live virus vaccines Other medications may affect the way this medication works. Talk with your care team about all of the medications you take. They may suggest changes to your treatment plan to lower the risk of side effects and to make sure your medications work as intended. This list may not describe all possible interactions. Give your health care provider a list of all the medicines, herbs, non-prescription drugs, or dietary supplements you use. Also tell them if you smoke, drink alcohol, or use illegal drugs. Some items may interact with your medicine. What should I watch for while using this medication? Your condition will be monitored carefully while you are receiving this medication. You may need blood work while taking this medication. This medication may make you feel generally unwell. This is not uncommon as chemotherapy can affect healthy cells as well as cancer cells. Report any side effects. Continue your course of treatment even though you feel ill unless your care team tells you to stop. This medication can cause serious allergic reactions. To reduce the risk, your care team may give you other medications to take before receiving this one. Be sure to follow the directions from your care team. This medication may increase your risk of getting an infection. Call your care team for advice if you get a fever, chills, sore throat, or other symptoms of a cold or flu. Do not treat yourself. Try to avoid  being around people who are sick. This medication may increase your risk to bruise or bleed. Call your care team if you notice any unusual bleeding. Be careful brushing or flossing your teeth or using a toothpick because you may get an infection or bleed more easily. If you have any dental work done, tell your dentist you are receiving this medication. Talk to your care team if you may be pregnant. Serious birth defects can occur if you take this medication during pregnancy. Talk to your care team before breastfeeding. Changes to your treatment plan may be needed. What side effects may I notice from receiving this medication? Side effects that you should report to your care team as soon as possible: Allergic reactions--skin rash, itching, hives, swelling of the face, lips, tongue, or throat Heart rhythm changes--fast or irregular heartbeat, dizziness, feeling faint or lightheaded, chest pain, trouble breathing Increase in blood pressure Infection--fever, chills, cough, sore throat, wounds that don't heal, pain or trouble when passing urine, general feeling of discomfort or being unwell Low blood pressure--dizziness, feeling faint or lightheaded, blurry vision Low red blood cell level--unusual weakness or fatigue, dizziness, headache, trouble breathing Painful swelling, warmth, or redness of the skin, blisters or  sores at the infusion site Pain, tingling, or numbness in the hands or feet Slow heartbeat--dizziness, feeling faint or lightheaded, confusion, trouble breathing, unusual weakness or fatigue Unusual bruising or bleeding Side effects that usually do not require medical attention (report to your care team if they continue or are bothersome): Diarrhea Hair loss Joint pain Loss of appetite Muscle pain Nausea Vomiting This list may not describe all possible side effects. Call your doctor for medical advice about side effects. You may report side effects to FDA at 1-800-FDA-1088. Where  should I keep my medication? This medication is given in a hospital or clinic. It will not be stored at home. NOTE: This sheet is a summary. It may not cover all possible information. If you have questions about this medicine, talk to your doctor, pharmacist, or health care provider.  2024 Elsevier/Gold Standard (2021-06-02 00:00:00)   Carboplatin  Injection What is this medication? CARBOPLATIN  (KAR boe pla tin) treats some types of cancer. It works by slowing down the growth of cancer cells. This medicine may be used for other purposes; ask your health care provider or pharmacist if you have questions. COMMON BRAND NAME(S): Paraplatin  What should I tell my care team before I take this medication? They need to know if you have any of these conditions: Blood disorders Hearing problems Kidney disease Recent or ongoing radiation therapy An unusual or allergic reaction to carboplatin , cisplatin, other medications, foods, dyes, or preservatives Pregnant or trying to get pregnant Breast-feeding How should I use this medication? This medication is injected into a vein. It is given by your care team in a hospital or clinic setting. Talk to your care team about the use of this medication in children. Special care may be needed. Overdosage: If you think you have taken too much of this medicine contact a poison control center or emergency room at once. NOTE: This medicine is only for you. Do not share this medicine with others. What if I miss a dose? Keep appointments for follow-up doses. It is important not to miss your dose. Call your care team if you are unable to keep an appointment. What may interact with this medication? Medications for seizures Some antibiotics, such as amikacin, gentamicin, neomycin, streptomycin, tobramycin Vaccines This list may not describe all possible interactions. Give your health care provider a list of all the medicines, herbs, non-prescription drugs, or dietary  supplements you use. Also tell them if you smoke, drink alcohol, or use illegal drugs. Some items may interact with your medicine. What should I watch for while using this medication? Your condition will be monitored carefully while you are receiving this medication. You may need blood work while taking this medication. This medication may make you feel generally unwell. This is not uncommon, as chemotherapy can affect healthy cells as well as cancer cells. Report any side effects. Continue your course of treatment even though you feel ill unless your care team tells you to stop. In some cases, you may be given additional medications to help with side effects. Follow all directions for their use. This medication may increase your risk of getting an infection. Call your care team for advice if you get a fever, chills, sore throat, or other symptoms of a cold or flu. Do not treat yourself. Try to avoid being around people who are sick. Avoid taking medications that contain aspirin, acetaminophen , ibuprofen, naproxen, or ketoprofen unless instructed by your care team. These medications may hide a fever. Be careful brushing or flossing your  teeth or using a toothpick because you may get an infection or bleed more easily. If you have any dental work done, tell your dentist you are receiving this medication. Talk to your care team if you wish to become pregnant or think you might be pregnant. This medication can cause serious birth defects. Talk to your care team about effective forms of contraception. Do not breast-feed while taking this medication. What side effects may I notice from receiving this medication? Side effects that you should report to your care team as soon as possible: Allergic reactions--skin rash, itching, hives, swelling of the face, lips, tongue, or throat Infection--fever, chills, cough, sore throat, wounds that don't heal, pain or trouble when passing urine, general feeling of  discomfort or being unwell Low red blood cell level--unusual weakness or fatigue, dizziness, headache, trouble breathing Pain, tingling, or numbness in the hands or feet, muscle weakness, change in vision, confusion or trouble speaking, loss of balance or coordination, trouble walking, seizures Unusual bruising or bleeding Side effects that usually do not require medical attention (report to your care team if they continue or are bothersome): Hair loss Nausea Unusual weakness or fatigue Vomiting This list may not describe all possible side effects. Call your doctor for medical advice about side effects. You may report side effects to FDA at 1-800-FDA-1088. Where should I keep my medication? This medication is given in a hospital or clinic. It will not be stored at home. NOTE: This sheet is a summary. It may not cover all possible information. If you have questions about this medicine, talk to your doctor, pharmacist, or health care provider.  2024 Elsevier/Gold Standard (2021-05-05 00:00:00)\[id number]\

## 2023-05-16 NOTE — Assessment & Plan Note (Signed)
 Chemotherapy plan as listed above

## 2023-05-16 NOTE — Assessment & Plan Note (Signed)
 JAK2 V617F mutation negative, with reflex to other mutations CALR, MPL, JAK 2 Ex 12-15 mutations negative. Negative BCR ABL. Most likely this is secondary erythrocytosis.  I suspect that this may be secondary to hypoxia due to underlying chronic lung diseases/emphysema or sleep apnea.  No need for phlebotomy given that hematocrit is less than 52. She declines sleep apnea work up

## 2023-05-16 NOTE — Assessment & Plan Note (Signed)
Same plan as above.  

## 2023-05-16 NOTE — Assessment & Plan Note (Addendum)
#  History of stage Ib right lung upper lobe adenocarcinoma status post wedge resection in 2016 History of left lower lobe adenocarcinoma T1 a NX status post wedge resection 2019. Recurrence - Biopsy of pretracheal lymph node positive for NSCLC.  MRI brain is negative.  Recommend concurrent chemotherapy [carboplatin  taxol ]  with radiation.  Labs are reviewed and discussed with patient. Proceed with cycle 1 carboplatin  AUC2, taxol  45mg /m2.  Rationale and side effects were reviewed with patient and husband in details.  We reviewed the antiemetics instruction in details.  She has allergic reaction to cortisone injection.  Dexamethasone  is in her premed, discussed about the rationale of Dexamethasone  and potential overlapping allergic reaction. She gets benadryl  and pepcid  as well for premed. She is willing to try Dexamethasone .

## 2023-05-17 ENCOUNTER — Encounter: Payer: Self-pay | Admitting: Oncology

## 2023-05-17 ENCOUNTER — Telehealth: Payer: Self-pay

## 2023-05-17 ENCOUNTER — Ambulatory Visit
Admission: RE | Admit: 2023-05-17 | Discharge: 2023-05-17 | Disposition: A | Discharge status: Home / Self Care | Source: Ambulatory Visit | Attending: Radiation Oncology | Admitting: Radiation Oncology

## 2023-05-17 ENCOUNTER — Other Ambulatory Visit: Payer: Self-pay

## 2023-05-17 DIAGNOSIS — C3411 Malignant neoplasm of upper lobe, right bronchus or lung: Secondary | ICD-10-CM | POA: Diagnosis not present

## 2023-05-17 LAB — RAD ONC ARIA SESSION SUMMARY
Course Elapsed Days: 1
Plan Fractions Treated to Date: 2
Plan Prescribed Dose Per Fraction: 2 Gy
Plan Total Fractions Prescribed: 33
Plan Total Prescribed Dose: 66 Gy
Reference Point Dosage Given to Date: 4 Gy
Reference Point Session Dosage Given: 2 Gy
Session Number: 2

## 2023-05-17 NOTE — Telephone Encounter (Signed)
 Telephone call to patient for follow up after receiving first infusion.   Patient husband states infusion went great.  States eating good and drinking plenty of fluids.   Denies any nausea or vomiting.  Encouraged patient husband to call for any concerns or questions.

## 2023-05-18 ENCOUNTER — Other Ambulatory Visit: Payer: Self-pay

## 2023-05-18 ENCOUNTER — Ambulatory Visit
Admission: RE | Admit: 2023-05-18 | Discharge: 2023-05-18 | Disposition: A | Discharge status: Home / Self Care | Source: Ambulatory Visit | Attending: Radiation Oncology | Admitting: Radiation Oncology

## 2023-05-18 DIAGNOSIS — C3411 Malignant neoplasm of upper lobe, right bronchus or lung: Secondary | ICD-10-CM | POA: Diagnosis not present

## 2023-05-18 LAB — RAD ONC ARIA SESSION SUMMARY
Course Elapsed Days: 2
Plan Fractions Treated to Date: 3
Plan Prescribed Dose Per Fraction: 2 Gy
Plan Total Fractions Prescribed: 33
Plan Total Prescribed Dose: 66 Gy
Reference Point Dosage Given to Date: 6 Gy
Reference Point Session Dosage Given: 2 Gy
Session Number: 3

## 2023-05-19 ENCOUNTER — Other Ambulatory Visit: Payer: Self-pay

## 2023-05-19 ENCOUNTER — Ambulatory Visit
Admission: RE | Admit: 2023-05-19 | Discharge: 2023-05-19 | Disposition: A | Discharge status: Home / Self Care | Source: Ambulatory Visit | Attending: Radiation Oncology | Admitting: Radiation Oncology

## 2023-05-19 DIAGNOSIS — C3411 Malignant neoplasm of upper lobe, right bronchus or lung: Secondary | ICD-10-CM | POA: Diagnosis not present

## 2023-05-19 LAB — RAD ONC ARIA SESSION SUMMARY
Course Elapsed Days: 3
Plan Fractions Treated to Date: 4
Plan Prescribed Dose Per Fraction: 2 Gy
Plan Total Fractions Prescribed: 33
Plan Total Prescribed Dose: 66 Gy
Reference Point Dosage Given to Date: 8 Gy
Reference Point Session Dosage Given: 2 Gy
Session Number: 4

## 2023-05-20 ENCOUNTER — Other Ambulatory Visit: Payer: Self-pay

## 2023-05-20 ENCOUNTER — Ambulatory Visit
Admission: RE | Admit: 2023-05-20 | Discharge: 2023-05-20 | Disposition: A | Discharge status: Home / Self Care | Source: Ambulatory Visit | Attending: Radiation Oncology | Admitting: Radiation Oncology

## 2023-05-20 DIAGNOSIS — C3411 Malignant neoplasm of upper lobe, right bronchus or lung: Secondary | ICD-10-CM | POA: Diagnosis not present

## 2023-05-20 LAB — RAD ONC ARIA SESSION SUMMARY
Course Elapsed Days: 4
Plan Fractions Treated to Date: 5
Plan Prescribed Dose Per Fraction: 2 Gy
Plan Total Fractions Prescribed: 33
Plan Total Prescribed Dose: 66 Gy
Reference Point Dosage Given to Date: 10 Gy
Reference Point Session Dosage Given: 2 Gy
Session Number: 5

## 2023-05-23 ENCOUNTER — Encounter: Payer: Self-pay | Admitting: Oncology

## 2023-05-23 ENCOUNTER — Encounter: Payer: Self-pay | Admitting: *Deleted

## 2023-05-23 ENCOUNTER — Inpatient Hospital Stay

## 2023-05-23 ENCOUNTER — Ambulatory Visit
Admission: RE | Admit: 2023-05-23 | Discharge: 2023-05-23 | Disposition: A | Discharge status: Home / Self Care | Source: Ambulatory Visit | Attending: Radiation Oncology | Admitting: Radiation Oncology

## 2023-05-23 ENCOUNTER — Inpatient Hospital Stay: Admitting: Oncology

## 2023-05-23 ENCOUNTER — Other Ambulatory Visit: Payer: Self-pay

## 2023-05-23 VITALS — BP 143/62 | HR 66 | Temp 96.6°F | Resp 18 | Wt 125.8 lb

## 2023-05-23 VITALS — BP 145/55 | HR 73 | Temp 98.5°F | Resp 18

## 2023-05-23 DIAGNOSIS — R110 Nausea: Secondary | ICD-10-CM | POA: Diagnosis not present

## 2023-05-23 DIAGNOSIS — Z5111 Encounter for antineoplastic chemotherapy: Secondary | ICD-10-CM

## 2023-05-23 DIAGNOSIS — C3411 Malignant neoplasm of upper lobe, right bronchus or lung: Secondary | ICD-10-CM | POA: Diagnosis not present

## 2023-05-23 DIAGNOSIS — T451X5A Adverse effect of antineoplastic and immunosuppressive drugs, initial encounter: Secondary | ICD-10-CM | POA: Diagnosis not present

## 2023-05-23 DIAGNOSIS — C3432 Malignant neoplasm of lower lobe, left bronchus or lung: Secondary | ICD-10-CM

## 2023-05-23 LAB — CMP (CANCER CENTER ONLY)
ALT: 20 U/L (ref 0–44)
AST: 20 U/L (ref 15–41)
Albumin: 4 g/dL (ref 3.5–5.0)
Alkaline Phosphatase: 57 U/L (ref 38–126)
Anion gap: 8 (ref 5–15)
BUN: 42 mg/dL — ABNORMAL HIGH (ref 8–23)
CO2: 27 mmol/L (ref 22–32)
Calcium: 9.6 mg/dL (ref 8.9–10.3)
Chloride: 104 mmol/L (ref 98–111)
Creatinine: 0.82 mg/dL (ref 0.44–1.00)
GFR, Estimated: >60 mL/min (ref >60)
Glucose, Bld: 69 mg/dL — ABNORMAL LOW (ref 70–99)
Potassium: 4.6 mmol/L (ref 3.5–5.1)
Sodium: 139 mmol/L (ref 135–145)
Total Bilirubin: 0.3 mg/dL (ref 0.0–1.2)
Total Protein: 6.7 g/dL (ref 6.5–8.1)

## 2023-05-23 LAB — CBC WITH DIFFERENTIAL (CANCER CENTER ONLY)
Abs Immature Granulocytes: 0.02 10*3/uL (ref 0.00–0.07)
Basophils Absolute: 0 10*3/uL (ref 0.0–0.1)
Basophils Relative: 1 %
Eosinophils Absolute: 0.1 10*3/uL (ref 0.0–0.5)
Eosinophils Relative: 3 %
HCT: 44.8 % (ref 36.0–46.0)
Hemoglobin: 14.8 g/dL (ref 12.0–15.0)
Immature Granulocytes: 0 %
Lymphocytes Relative: 38 %
Lymphs Abs: 1.7 10*3/uL (ref 0.7–4.0)
MCH: 32.7 pg (ref 26.0–34.0)
MCHC: 33 g/dL (ref 30.0–36.0)
MCV: 98.9 fL (ref 80.0–100.0)
Monocytes Absolute: 0.3 10*3/uL (ref 0.1–1.0)
Monocytes Relative: 8 %
Neutro Abs: 2.3 10*3/uL (ref 1.7–7.7)
Neutrophils Relative %: 50 %
Platelet Count: 192 10*3/uL (ref 150–400)
RBC: 4.53 MIL/uL (ref 3.87–5.11)
RDW: 12.4 % (ref 11.5–15.5)
WBC Count: 4.5 10*3/uL (ref 4.0–10.5)
nRBC: 0 % (ref 0.0–0.2)

## 2023-05-23 LAB — RAD ONC ARIA SESSION SUMMARY
Course Elapsed Days: 7
Plan Fractions Treated to Date: 6
Plan Prescribed Dose Per Fraction: 2 Gy
Plan Total Fractions Prescribed: 33
Plan Total Prescribed Dose: 66 Gy
Reference Point Dosage Given to Date: 12 Gy
Reference Point Session Dosage Given: 2 Gy
Session Number: 6

## 2023-05-23 MED ORDER — SODIUM CHLORIDE 0.9 % IV SOLN
50.0000 mg/m2 | Freq: Once | INTRAVENOUS | Status: AC
  Administered 2023-05-23: 78 mg via INTRAVENOUS
  Filled 2023-05-23: qty 13

## 2023-05-23 MED ORDER — SODIUM CHLORIDE 0.9 % IV SOLN
134.0000 mg | Freq: Once | INTRAVENOUS | Status: AC
Start: 2023-05-23 — End: 2023-05-23
  Administered 2023-05-23: 130 mg via INTRAVENOUS
  Filled 2023-05-23: qty 13

## 2023-05-23 MED ORDER — PALONOSETRON HCL INJECTION 0.25 MG/5ML
0.2500 mg | Freq: Once | INTRAVENOUS | Status: AC
  Administered 2023-05-23: 0.25 mg via INTRAVENOUS
  Filled 2023-05-23: qty 5

## 2023-05-23 MED ORDER — DIPHENHYDRAMINE HCL 50 MG/ML IJ SOLN
50.0000 mg | Freq: Once | INTRAMUSCULAR | Status: AC
  Administered 2023-05-23: 50 mg via INTRAVENOUS
  Filled 2023-05-23: qty 1

## 2023-05-23 MED ORDER — FAMOTIDINE IN NACL 20-0.9 MG/50ML-% IV SOLN
20.0000 mg | Freq: Once | INTRAVENOUS | Status: AC
Start: 2023-05-23 — End: 2023-05-23
  Administered 2023-05-23: 20 mg via INTRAVENOUS
  Filled 2023-05-23: qty 50

## 2023-05-23 MED ORDER — SODIUM CHLORIDE 0.9 % IV SOLN
INTRAVENOUS | Status: DC
Start: 2023-05-23 — End: 2023-05-23
  Filled 2023-05-23: qty 250

## 2023-05-23 MED ORDER — DEXAMETHASONE SODIUM PHOSPHATE 10 MG/ML IJ SOLN
10.0000 mg | Freq: Once | INTRAMUSCULAR | Status: AC
  Administered 2023-05-23: 10 mg via INTRAVENOUS
  Filled 2023-05-23: qty 1

## 2023-05-23 NOTE — Progress Notes (Signed)
 Hematology/Oncology Progress note [phone] [phone]     Chief Complaint:  Follow up for recurrent non small cell lung cancer   ASSESSMENT & PLAN:  Heather [name] is a 88 y.o. 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 T2aN1M0 (stage IB).  She is s/p wedge resection of a left lower lobe mass on 03/10/2017. T1aNx (stage IA). No with locally recurrent NSCLC   Malignant neoplasm of lower lobe of left lung (HCC) #History of stage Ib right lung upper lobe adenocarcinoma status post wedge resection in 2016 History of left lower lobe adenocarcinoma T1 a NX status post wedge resection 2019. Recurrence - Biopsy of pretracheal lymph node positive for NSCLC.  MRI brain is negative.  Recommend concurrent chemotherapy [carboplatin  taxol ]  with radiation.  Labs are reviewed and discussed with patient. Proceed with D8 carboplatin  AUC2, taxol  45mg /m2.      Encounter for antineoplastic chemotherapy Chemotherapy plan as listed above.   Primary cancer of right upper lobe of lung [facility]) Same plan as above.   Chemotherapy-induced nausea She did not tolerate compazine  during first 3 days.  Options of trying other medication were discussed and patient prefers not to try new medication.    No orders of the defined types were placed in this encounter.  Follow up 1 week.   All questions were answered. The patient knows to call the clinic with any problems, questions or concerns.  We spent sufficient time to discuss many aspect of care, questions were answered to patient's satisfaction.   Timmy Forbes, MD, PhD [facility] Hematology Oncology 05/23/2023   PERTINENT ONCOLOGY HISTORY Previously follows up with Dr.Corcoran. [place]bl[place] care with me on 03/15/2018  PET scan on 02/13/2014 revealed a 1 cm right upper lobe hypermetabolic nodule (SUV 3.8) and a 6 mm right upper lobe nodule (no metabolic activity). There was no  mediastianl adenopathy.  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 Pathology revealed a 1.1 cm high-grade adenocarcinoma with pleural invasion. Nodes were negative. Pathologic stage was T2aN1M0 (stage IB).  Chest CT on 02/10/2017 revealed clear interval progression of the posterior left lower lobe pulmonary nodule, now measuring 12 mm in long axis and having a distinctly lobular contour. Imaging features were very concerning for neoplasm.  There was no change in the 7 mm nodule posterior to the right hilum.  PET scan on 02/18/2017 revealed a 11 x 8 mm posterior left lower lobe pulmonary nodule that had mildly progressed from prior studies and demonstrated mild hypermetabolism (SUV 1.6).  The appearance was considered worrisome for an indolent primary bronchogenic neoplasm.  03/10/2017.  s/p wedge resection of a left lower lobe mass.   Pathology revealed an 8 mm invasive adenocarcinoma with solid and acinar patterns.  There was no visceral invasion.  There was no lymphovascular invasion.  All margins were negative.  Pathologic stage was T1aNx (stage IA).   INTERVAL HISTORY Heather [name] is a 88 y.o. female who has above history reviewed by me today presents for follow up visit for management of history of lung cancer, 6 months assessment.  Patient reports feeling well.  She was accompanied by her husband. Denies shortness of breath, fatigue, hemoptysis, unintentional weight loss. She reports feeling bad after taking compazine  PRN for 2 days which caused her to be drowsy.    Past Medical History:  Diagnosis Date   Anginal pain (HCC)    Benign essential hypertension  Brain aneurysm    Carotid artery stenosis    Carotid stenosis 06/19/2013   Colonic polyp    Emphysema lung (HCC)    GERD (gastroesophageal reflux disease)    Hiatal hernia    Hypercholesteremia    Hyperlipidemia, mixed 06/09/2015   Lung cancer (HCC) 2016   Lung nodule  06/21/2014   Macrocytic 10/06/2014   Multiple thyroid  nodules    PAT (paroxysmal atrial tachycardia) (HCC) 06/19/2013   Plantar fasciitis     Past Surgical History:  Procedure Laterality Date   BREAST EXCISIONAL BIOPSY Right 40 yrs ago   neg   BREAST LUMPECTOMY     benign   CATARACT EXTRACTION W/ INTRAOCULAR LENS  IMPLANT, BILATERAL Bilateral    CEREBRAL ANEURYSM REPAIR  2004   coil treatment   CERVIX LESION DESTRUCTION     COLONOSCOPY     1995, 2006, 2014   COLONOSCOPY WITH PROPOFOL  N/A 03/31/2015   Procedure: COLONOSCOPY WITH PROPOFOL ;  Surgeon: Cassie Click, MD;  Location: [facility];  Service: Endoscopy;  Laterality: N/A;   CYSTOSCOPY W/ RETROGRADES Left 03/02/2022   Procedure: CYSTOSCOPY WITH RETROGRADE PYELOGRAM;  Surgeon: Geraline Knapp, MD;  Location: [facility] ORS;  Service: Urology;  Laterality: Left;   CYSTOSCOPY W/ RETROGRADES Left 12/28/2022   Procedure: CYSTOSCOPY WITH RETROGRADE PYELOGRAM;  Surgeon: Geraline Knapp, MD;  Location: [facility] ORS;  Service: Urology;  Laterality: Left;   CYSTOSCOPY W/ URETERAL STENT PLACEMENT Left 03/03/2021   Procedure: CYSTOSCOPY WITH EXCHANGE;  Surgeon: Geraline Knapp, MD;  Location: [facility] ORS;  Service: Urology;  Laterality: Left;   CYSTOSCOPY W/ URETERAL STENT PLACEMENT Left 03/02/2022   Procedure: CYSTOSCOPY WITH STENT EXCHANGE;  Surgeon: Geraline Knapp, MD;  Location: [facility] ORS;  Service: Urology;  Laterality: Left;   CYSTOSCOPY W/ URETERAL STENT PLACEMENT Left 12/28/2022   Procedure: CYSTOSCOPY WITH STENT EXCHANGE;  Surgeon: Geraline Knapp, MD;  Location: [facility] ORS;  Service: Urology;  Laterality: Left;   CYSTOSCOPY WITH STENT PLACEMENT Left 11/25/2020   Procedure: CYSTOSCOPY WITH STENT PLACEMENT;  Surgeon: Geraline Knapp, MD;  Location: [facility] ORS;  Service: Urology;  Laterality: Left;   ENDOBRONCHIAL ULTRASOUND Bilateral 04/19/2023   Procedure: ENDOBRONCHIAL ULTRASOUND (EBUS);  Surgeon: Vergia Glasgow, MD;  Location: [facility]  ORS;  Service: Pulmonary;  Laterality: Bilateral;   FLEXIBLE BRONCHOSCOPY N/A 03/10/2017   Procedure: FLEXIBLE BRONCHOSCOPY;  Surgeon: Petra Brandy, MD;  Location: [facility] ORS;  Service: Thoracic;  Laterality: N/A;   THORACOSCOPY WITH WEDGE RESECTION LUNG Right 03/13/2014   RUL   THORACOTOMY/LOBECTOMY Left 03/10/2017   Procedure: THORACOTOMY WITH LUNG WEDGE RESECTION POSSIBLE LOBECTOMY;  Surgeon: Petra Brandy, MD;  Location: [facility] ORS;  Service: Thoracic;  Laterality: Left;   TUBAL LIGATION      Family History  Problem Relation Age of Onset   Alcohol abuse Mother    Heart attack Father    Breast cancer Neg Hx     Social History:  reports that she quit smoking about 34 years ago. Her smoking use included cigarettes. She started smoking about 69 years ago. She has a 35 pack-year smoking history. She has never used smokeless tobacco. She reports current alcohol use. She reports that she does not use drugs.   Allergies:  Allergies  Allergen Reactions   Ace Inhibitors Swelling   Contrast Media [Iodinated Contrast Media]    Cortisone     Patient had facial swelling and itching from cortisone injection IM    Current Medications: Current Outpatient Medications  Medication  Sig Dispense Refill   amLODipine (NORVASC) 5 MG tablet Take 5 mg by mouth at bedtime.     amoxicillin  (AMOXIL ) 875 MG tablet Take 875 mg by mouth as needed.     atenolol  (TENORMIN ) 50 MG tablet Take 50 mg by mouth at bedtime.     calcium -vitamin D  (OSCAL WITH D) 500-200 MG-UNIT tablet Take 1 tablet by mouth daily with breakfast.     cetirizine (ZYRTEC) 10 MG tablet Take 10 mg by mouth daily.     dextromethorphan-guaiFENesin (MUCINEX DM) 30-600 MG 12hr tablet Take 1 tablet by mouth 2 (two) times daily as needed for cough.     donepezil (ARICEPT) 10 MG tablet Take 1 tablet by mouth at bedtime.     fluticasone  (FLONASE ) 50 MCG/ACT nasal spray Place 1 spray into the nose daily as needed for allergies.      ondansetron   (ZOFRAN ) 8 MG tablet Take 1 tablet (8 mg total) by mouth every 8 (eight) hours as needed for nausea or vomiting. Start on the third day after chemotherapy. 30 tablet 1   prochlorperazine  (COMPAZINE ) 10 MG tablet Take 1 tablet (10 mg total) by mouth every 6 (six) hours as needed for nausea or vomiting. 30 tablet 1   simvastatin  (ZOCOR ) 20 MG tablet Take 20 mg by mouth every morning.     vitamin B-12 (CYANOCOBALAMIN ) 100 MCG tablet Take 100 mcg by mouth daily.     No current facility-administered medications for this visit.   Facility-Administered Medications Ordered in Other Visits  Medication Dose Route Frequency Provider Last Rate Last Admin   0.9 %  sodium chloride  infusion   Intravenous Continuous Timmy Forbes, MD 10 mL/hr at 05/23/23 1015 New Bag at 05/23/23 1015   CARBOplatin  (PARAPLATIN ) 130 mg in sodium chloride  0.9 % 100 mL chemo infusion  130 mg Intravenous Once Timmy Forbes, MD       PACLitaxel  (TAXOL ) 78 mg in sodium chloride  0.9 % 250 mL chemo infusion (</= 80mg /m2)  50 mg/m2 (Treatment Plan Recorded) Intravenous Once Timmy Forbes, MD 263 mL/hr at 05/23/23 1151 78 mg at 05/23/23 1151   Review of Systems  Constitutional:  Negative for appetite change, chills, fatigue and fever.  HENT:   Positive for hearing loss. Negative for voice change.   Eyes:  Negative for eye problems.  Respiratory:  Negative for chest tightness and cough.   Cardiovascular:  Negative for chest pain.  Gastrointestinal:  Positive for nausea. Negative for abdominal distention, abdominal pain and blood in stool.  Endocrine: Negative for hot flashes.  Genitourinary:  Negative for difficulty urinating and frequency.   Musculoskeletal:  Negative for arthralgias.       Chronic intermittent back pain  Skin:  Negative for itching and rash.  Neurological:  Negative for extremity weakness.  Hematological:  Negative for adenopathy.  Psychiatric/Behavioral:  Negative for confusion.     Performance status (ECOG): 0 -  Asymptomatic  Vital Signs BP (!) 143/62 (BP Location: Left Arm, Patient Position: Sitting, Cuff Size: Normal)   Pulse 66   Temp (!) 96.6 F (35.9 C) (Tympanic)   Resp 18   Wt 125 lb 12.8 oz (57.1 kg)   SpO2 97%   BMI 23.01 kg/m   Physical Exam Constitutional:      General: She is not in acute distress.    Appearance: She is not diaphoretic.  HENT:     Head: Normocephalic and atraumatic.  Eyes:     General: No scleral icterus. Cardiovascular:  Rate and Rhythm: Normal rate and regular rhythm.  Pulmonary:     Effort: Pulmonary effort is normal. No respiratory distress.  Abdominal:     General: There is no distension.     Palpations: Abdomen is soft.     Tenderness: There is no abdominal tenderness.  Musculoskeletal:        General: Normal range of motion.     Cervical back: Normal range of motion and neck supple.  Skin:    General: Skin is dry.     Findings: No erythema.  Neurological:     Mental Status: She is alert and oriented to person, place, and time. Mental status is at baseline.  Psychiatric:        Mood and Affect: Mood and affect normal.     RADIOGRAPHIC STUDIES: I have personally reviewed the radiological images as listed and agreed with the findings in the report. MR Brain W Wo Contrast Result Date: 04/27/2023 CLINICAL DATA:  Lung cancer.  Staging. EXAM: MRI HEAD WITHOUT AND WITH CONTRAST TECHNIQUE: Multiplanar, multiecho pulse sequences of the brain and surrounding structures were obtained without and with intravenous contrast. CONTRAST:  5mL GADAVIST  GADOBUTROL  1 MMOL/ML IV SOLN COMPARISON:  07/15/2015 FINDINGS: Brain: Diffusion imaging does not show any acute or subacute infarction. Brainstem and cerebellum are normal. Cerebral hemispheres show minimal small vessel change of the white matter, less than often seen at this age. No cortical or large vessel territory infarction. No mass lesion, hemorrhage, hydrocephalus or extra-axial collection. Vascular: Major  vessels at the base of the brain show flow. Persistent trigeminal artery on the left as seen on previous imaging. Redemonstration of a small aneurysm of the left supraclinoid ICA, not studied in detail. Skull and upper cervical spine: Negative Sinuses/Orbits: Clear/normal Other: None IMPRESSION: 1. No evidence of metastatic disease. Minimal small vessel change of the cerebral hemispheric white matter, less than often seen at this age. 2. Persistent trigeminal artery on the left as seen on previous imaging. Redemonstration of a small aneurysm of the left supraclinoid ICA, not studied in detail. Electronically Signed   By: Bettylou Brunner M.D.   On: 04/27/2023 11:17   DG Chest Port 1 View Result Date: 04/19/2023 CLINICAL DATA:  Status post bronchoscopy. EXAM: PORTABLE CHEST 1 VIEW COMPARISON:  Chest radiograph dated 12/15/2022 and CT dated 03/18/2023. FINDINGS: No focal consolidation, pleural effusion, pneumothorax. Surgical suture in the left lower lung field. The cardiac silhouette is within normal limits. Atherosclerotic calcification of the aortic arch. No acute osseous pathology. IMPRESSION: No active disease. No pneumothorax. Electronically Signed   By: Angus Bark M.D.   On: 04/19/2023 12:38   NM PET Image Initial (PI) Skull Base To Thigh Result Date: 04/05/2023 CLINICAL DATA:  Subsequent treatment strategy for lung carcinoma. Abnormal CT with enlarged pretracheal lymph CT. EXAM: NUCLEAR MEDICINE PET SKULL BASE TO THIGH TECHNIQUE: mCi F-18 FDG was injected intravenously. Full-ring PET imaging was performed from the skull base to thigh after the radiotracer. CT data was obtained and used for attenuation correction and anatomic localization. Fasting blood glucose:  mg/dl COMPARISON:  None Available. FINDINGS: NECK: No hypermetabolic lymph nodes in the neck.  Is Incidental CT findings: None. CHEST: Postsurgical change in the RIGHT upper lobe. No hypermetabolic pulmonary nodules. The enlarged pretracheal  nodule of concern measures 11 mm short axis (image 43/6) and has intense metabolic activity with SUV max equal 10.1. No additional hypermetabolic mediastinal hilar nodes. No supraclavicular adenopathy. Incidental CT findings: Small nodule along the LEFT  oblique fissure measuring 3 mm (image 56/6) has metabolic activity (SUV max equal 2.8) . This nodule was present on CT 03/16/2022 and similar size. Nodule also present on CT from 03/16/2021 and same size. ABDOMEN/PELVIS: No abnormal hypermetabolic activity within the liver, pancreas, adrenal glands, or spleen. No hypermetabolic lymph nodes in the abdomen or pelvis. Incidental CT findings: LEFT hydronephrosis. Double-J ureteral stent on the LEFT. The proximal stent is in the dilated LEFT renal pelvis. There is evidence of urine excretion along the course of the LEFT ureter. SKELETON: No focal hypermetabolic activity to suggest skeletal metastasis. Incidental CT findings: None. IMPRESSION: 1. Hypermetabolic pretracheal lymph node is concerning for metastatic adenopathy. 2. No evidence of local recurrence in the RIGHT upper lobe. 3. No evidence of metastatic disease in the abdomen pelvis. 4. Small nodule along the LEFT oblique fissure has metabolic activity; however, nodule has been stable in size since 03/16/2021. Favor benign nodule. 5. LEFT hydronephrosis with double-J ureteral stent in place. Electronically Signed   By: Deboraha Fallow M.D.   On: 04/05/2023 11:35   CT Chest Wo Contrast Result Date: 03/22/2023 CLINICAL DATA:  History of stage I B right upper lobe adenocarcinoma. Status post right wedge resection. * Tracking Code: BO * EXAM: CT CHEST WITHOUT CONTRAST TECHNIQUE: Multidetector CT imaging of the chest was performed following the standard protocol without IV contrast. RADIATION DOSE REDUCTION: This exam was performed according to the departmental dose-optimization program which includes automated exposure control, adjustment of the mA and/or kV  according to patient size and/or use of iterative reconstruction technique. COMPARISON:  X-ray 12/15/2022.  CT scan 03/16/2022 and older. FINDINGS: Cardiovascular: On this non IV contrast exam the thoracic aorta has a normal course and caliber with moderate calcified atherosclerotic plaque. Calcified plaque also extends along the great vessels as well as along the coronary arteries. Heart is nonenlarged. No significant pericardial effusion. There is some enlargement of the main pulmonary artery. Please correlate for evidence of pulmonary hypertension. Mediastinum/Nodes: Small thyroid  gland. Slightly patulous thoracic esophagus with small hiatal hernia. Unchanged from previous. On this non IV contrast exam there is no specific abnormal lymph node enlargement identified in the axillary regions or hila. Enlarged pretracheal lymph node identified on series 2, image 49 today measuring 19 by 13 mm. Previously 16 x 10 mm. Smaller on older examinations as well. Slightly larger than previous. There are no other clear abnormal nodal areas of enlargement in the mediastinum. Lungs/Pleura: Stable surgical changes along left lower lobe with wedge resection. Areas of scarring and fibrotic changes. Other areas of scarring atelectatic changes are also seen. Apical pleural thickening. Surgical is also seen along the anteromedial right upper lobe. No consolidation or pneumothorax. Diffuse centrilobular emphysematous changes. There is a focal irregular apical nodule on the left previously measuring 6 x 7 mm. Today this area is slightly less nodular but there continues to be some distortion. Area is measured at 6 x 5 mm on series 4, image 26 today. The ground-glass nodular area in the posteromedial right lower lobe which previously measured 13 x 11 mm, today is similar in size and appearance on series 4, image 77. 3 mm middle lobe nodule stable today on series 4, image 89. There is also a nodule in the right upper lobe posterior to the  lung hilum measuring 8 by 5 mm on series 4, image 50. This is unchanged in retrospect. This has been present and stable since at least August 2022 demonstrating long-term stability. No new dominant lung  nodule identified. Upper Abdomen: Adrenal glands are preserved in the upper abdomen. Once again there is an extrarenal pelvis left kidney, unchanged from previous with some ectasia. Benign-appearing hepatic cystic foci. Musculoskeletal: Scattered degenerative changes along the spine. IMPRESSION: Mildly enlarged pre tracheal lymph node is slightly increased in size today from previous. Recommend additional PET-CT evaluation as clinically appropriate. Alternatively, a follow-up in 3 months could be considered with the slow interval growth. Stable bilateral areas of lung nodularity and postsurgical changes. Aortic Atherosclerosis (ICD10-I70.0) and Emphysema (ICD10-J43.9). Electronically Signed   By: Adrianna Horde M.D.   On: 03/22/2023 20:40    Laboratory results were reviewed by me    Latest Ref Rng & Units 05/23/2023    8:34 AM 05/16/2023    8:39 AM 03/22/2023   12:31 PM  CBC  WBC 4.0 - 10.5 K/uL 4.5  6.5  8.2   Hemoglobin 12.0 - 15.0 g/dL 16.1  09.6  04.5   Hematocrit 36.0 - 46.0 % 44.8  46.2  46.8   Platelets 150 - 400 K/uL 192  170  224       Latest Ref Rng & Units 05/23/2023    8:34 AM 05/16/2023    8:39 AM 03/22/2023   12:30 PM  CMP  Glucose 70 - 99 mg/dL 69  95  87   BUN 8 - 23 mg/dL 42  16  21   Creatinine 0.44 - 1.00 mg/dL 4.09  8.11  9.14   Sodium 135 - 145 mmol/L 139  142  140   Potassium 3.5 - 5.1 mmol/L 4.6  4.1  4.6   Chloride 98 - 111 mmol/L 104  106  104   CO2 22 - 32 mmol/L 27  28  26    Calcium  8.9 - 10.3 mg/dL 9.6  9.2  9.5   Total Protein 6.5 - 8.1 g/dL 6.7  6.8  7.1   Total Bilirubin 0.0 - 1.2 mg/dL 0.3  0.7  0.7   Alkaline Phos 38 - 126 U/L 57  69  59   AST 15 - 41 U/L 20  22  22    ALT 0 - 44 U/L 20  14  17

## 2023-05-23 NOTE — Assessment & Plan Note (Signed)
 She did not tolerate compazine  during first 3 days.  Options of trying other medication were discussed and patient prefers not to try new medication.

## 2023-05-23 NOTE — Assessment & Plan Note (Signed)
#  History of stage Ib right lung upper lobe adenocarcinoma status post wedge resection in 2016 History of left lower lobe adenocarcinoma T1 a NX status post wedge resection 2019. Recurrence - Biopsy of pretracheal lymph node positive for NSCLC.  MRI brain is negative.  Recommend concurrent chemotherapy [carboplatin  taxol ]  with radiation.  Labs are reviewed and discussed with patient. Proceed with D8 carboplatin  AUC2, taxol  45mg /m2.

## 2023-05-23 NOTE — Patient Instructions (Signed)
 [facility] - A DEPT OF [facility]  Discharge Instructions: Thank you for choosing [facility] to provide your oncology and hematology care.  If you have a lab appointment with the Cancer Center, please go directly to the Cancer Center and check in at the registration area.  Wear comfortable clothing and clothing appropriate for easy access to any Portacath or PICC line.   We strive to give you quality time with your provider. You may need to reschedule your appointment if you arrive late (15 or more minutes).  Arriving late affects you and other patients whose appointments are after yours.  Also, if you miss three or more appointments without notifying the office, you may be dismissed from the clinic at the provider's discretion.      For prescription refill requests, have your pharmacy contact our office and allow 72 hours for refills to be completed.    Today you received the following chemotherapy and/or immunotherapy agents Taxol and Carboplatin       To help prevent nausea and vomiting after your treatment, we encourage you to take your nausea medication as directed.  BELOW ARE SYMPTOMS THAT SHOULD BE REPORTED IMMEDIATELY: *FEVER GREATER THAN 100.4 F (38 C) OR HIGHER *CHILLS OR SWEATING *NAUSEA AND VOMITING THAT IS NOT CONTROLLED WITH YOUR NAUSEA MEDICATION *UNUSUAL SHORTNESS OF BREATH *UNUSUAL BRUISING OR BLEEDING *URINARY PROBLEMS (pain or burning when urinating, or frequent urination) *BOWEL PROBLEMS (unusual diarrhea, constipation, pain near the anus) TENDERNESS IN MOUTH AND THROAT WITH OR WITHOUT PRESENCE OF ULCERS (sore throat, sores in mouth, or a toothache) UNUSUAL RASH, SWELLING OR PAIN  UNUSUAL VAGINAL DISCHARGE OR ITCHING   Items with * indicate a potential emergency and should be followed up as soon as possible or go to the Emergency Department if any problems should occur.  Please show the CHEMOTHERAPY ALERT CARD or  IMMUNOTHERAPY ALERT CARD at check-in to the Emergency Department and triage nurse.  Should you have questions after your visit or need to cancel or reschedule your appointment, please contact [facility] - A DEPT OF [facility]  [phone] and follow the prompts.  Office hours are 8:00 a.m. to 4:30 p.m. Monday - Friday. Please note that voicemails left after 4:00 p.m. may not be returned until the following business day.  We are closed weekends and major holidays. You have access to a nurse at all times for urgent questions. Please call the main number to the clinic [phone] and follow the prompts.  For any non-urgent questions, you may also contact your provider using MyChart. We now offer e-Visits for anyone 72 and older to request care online for non-urgent symptoms. For details visit [email or web address].   Also download the MyChart app! Go to the app store, search "MyChart", open the app, select [place], and log in with your MyChart username and password.

## 2023-05-23 NOTE — Assessment & Plan Note (Signed)
 Chemotherapy plan as listed above

## 2023-05-23 NOTE — Assessment & Plan Note (Signed)
Same plan as above.  

## 2023-05-24 ENCOUNTER — Ambulatory Visit
Admission: RE | Admit: 2023-05-24 | Discharge: 2023-05-24 | Disposition: A | Discharge status: Home / Self Care | Source: Ambulatory Visit | Attending: Radiation Oncology | Admitting: Radiation Oncology

## 2023-05-24 ENCOUNTER — Other Ambulatory Visit: Payer: Self-pay

## 2023-05-24 DIAGNOSIS — C3411 Malignant neoplasm of upper lobe, right bronchus or lung: Secondary | ICD-10-CM | POA: Diagnosis not present

## 2023-05-24 LAB — RAD ONC ARIA SESSION SUMMARY
Course Elapsed Days: 8
Plan Fractions Treated to Date: 7
Plan Prescribed Dose Per Fraction: 2 Gy
Plan Total Fractions Prescribed: 33
Plan Total Prescribed Dose: 66 Gy
Reference Point Dosage Given to Date: 14 Gy
Reference Point Session Dosage Given: 2 Gy
Session Number: 7

## 2023-05-25 ENCOUNTER — Other Ambulatory Visit: Payer: Self-pay

## 2023-05-25 ENCOUNTER — Ambulatory Visit
Admission: RE | Admit: 2023-05-25 | Discharge: 2023-05-25 | Disposition: A | Discharge status: Home / Self Care | Source: Ambulatory Visit | Attending: Radiation Oncology | Admitting: Radiation Oncology

## 2023-05-25 DIAGNOSIS — C3411 Malignant neoplasm of upper lobe, right bronchus or lung: Secondary | ICD-10-CM | POA: Diagnosis not present

## 2023-05-25 LAB — RAD ONC ARIA SESSION SUMMARY
Course Elapsed Days: 9
Plan Fractions Treated to Date: 8
Plan Prescribed Dose Per Fraction: 2 Gy
Plan Total Fractions Prescribed: 33
Plan Total Prescribed Dose: 66 Gy
Reference Point Dosage Given to Date: 16 Gy
Reference Point Session Dosage Given: 2 Gy
Session Number: 8

## 2023-05-26 ENCOUNTER — Ambulatory Visit
Admission: RE | Admit: 2023-05-26 | Discharge: 2023-05-26 | Disposition: A | Discharge status: Home / Self Care | Source: Ambulatory Visit | Attending: Radiation Oncology | Admitting: Radiation Oncology

## 2023-05-26 ENCOUNTER — Other Ambulatory Visit: Payer: Self-pay

## 2023-05-26 DIAGNOSIS — I10 Essential (primary) hypertension: Secondary | ICD-10-CM | POA: Diagnosis not present

## 2023-05-26 DIAGNOSIS — Z79899 Other long term (current) drug therapy: Secondary | ICD-10-CM | POA: Insufficient documentation

## 2023-05-26 DIAGNOSIS — Z510 Encounter for antineoplastic radiation therapy: Secondary | ICD-10-CM | POA: Diagnosis not present

## 2023-05-26 DIAGNOSIS — Z87891 Personal history of nicotine dependence: Secondary | ICD-10-CM | POA: Insufficient documentation

## 2023-05-26 DIAGNOSIS — Z860100 Personal history of colon polyps, unspecified: Secondary | ICD-10-CM | POA: Insufficient documentation

## 2023-05-26 DIAGNOSIS — Z85118 Personal history of other malignant neoplasm of bronchus and lung: Secondary | ICD-10-CM | POA: Diagnosis not present

## 2023-05-26 DIAGNOSIS — E782 Mixed hyperlipidemia: Secondary | ICD-10-CM | POA: Insufficient documentation

## 2023-05-26 DIAGNOSIS — C3411 Malignant neoplasm of upper lobe, right bronchus or lung: Secondary | ICD-10-CM | POA: Insufficient documentation

## 2023-05-26 DIAGNOSIS — J439 Emphysema, unspecified: Secondary | ICD-10-CM | POA: Diagnosis not present

## 2023-05-26 LAB — RAD ONC ARIA SESSION SUMMARY
Course Elapsed Days: 10
Plan Fractions Treated to Date: 9
Plan Prescribed Dose Per Fraction: 2 Gy
Plan Total Fractions Prescribed: 33
Plan Total Prescribed Dose: 66 Gy
Reference Point Dosage Given to Date: 18 Gy
Reference Point Session Dosage Given: 2 Gy
Session Number: 9

## 2023-05-27 ENCOUNTER — Ambulatory Visit
Admission: RE | Admit: 2023-05-27 | Discharge: 2023-05-27 | Disposition: A | Discharge status: Home / Self Care | Source: Ambulatory Visit | Attending: Radiation Oncology | Admitting: Radiation Oncology

## 2023-05-27 ENCOUNTER — Other Ambulatory Visit: Payer: Self-pay

## 2023-05-27 ENCOUNTER — Telehealth: Payer: Self-pay | Admitting: *Deleted

## 2023-05-27 DIAGNOSIS — C3411 Malignant neoplasm of upper lobe, right bronchus or lung: Secondary | ICD-10-CM | POA: Diagnosis not present

## 2023-05-27 LAB — RAD ONC ARIA SESSION SUMMARY
Course Elapsed Days: 11
Plan Fractions Treated to Date: 10
Plan Prescribed Dose Per Fraction: 2 Gy
Plan Total Fractions Prescribed: 33
Plan Total Prescribed Dose: 66 Gy
Reference Point Dosage Given to Date: 20 Gy
Reference Point Session Dosage Given: 2 Gy
Session Number: 10

## 2023-05-27 NOTE — Telephone Encounter (Signed)
 The paramedics came over and checked her vitals got her something to drink and she is laying on the bed and she is feeling better per the paramedic person on the phone talking to me. She says that they will come and check on her again in a little while but she was already told that if this happens again she needs to go to the ER.  Patient and the husband was both told this

## 2023-05-28 ENCOUNTER — Encounter: Payer: Self-pay | Admitting: Internal Medicine

## 2023-05-28 ENCOUNTER — Other Ambulatory Visit: Payer: Self-pay

## 2023-05-28 ENCOUNTER — Emergency Department

## 2023-05-28 ENCOUNTER — Inpatient Hospital Stay
Admission: EM | Admit: 2023-05-28 | Discharge: 2023-05-30 | DRG: 391 | Disposition: A | Discharge status: Home / Self Care | Attending: Family Medicine | Admitting: Family Medicine

## 2023-05-28 DIAGNOSIS — B964 Proteus (mirabilis) (morganii) as the cause of diseases classified elsewhere: Secondary | ICD-10-CM | POA: Diagnosis present

## 2023-05-28 DIAGNOSIS — K5792 Diverticulitis of intestine, part unspecified, without perforation or abscess without bleeding: Secondary | ICD-10-CM | POA: Diagnosis not present

## 2023-05-28 DIAGNOSIS — N131 Hydronephrosis with ureteral stricture, not elsewhere classified: Secondary | ICD-10-CM | POA: Diagnosis present

## 2023-05-28 DIAGNOSIS — K5732 Diverticulitis of large intestine without perforation or abscess without bleeding: Principal | ICD-10-CM | POA: Diagnosis present

## 2023-05-28 DIAGNOSIS — Z8249 Family history of ischemic heart disease and other diseases of the circulatory system: Secondary | ICD-10-CM

## 2023-05-28 DIAGNOSIS — Z91041 Radiographic dye allergy status: Secondary | ICD-10-CM

## 2023-05-28 DIAGNOSIS — R55 Syncope and collapse: Secondary | ICD-10-CM | POA: Diagnosis present

## 2023-05-28 DIAGNOSIS — Z85118 Personal history of other malignant neoplasm of bronchus and lung: Secondary | ICD-10-CM

## 2023-05-28 DIAGNOSIS — D849 Immunodeficiency, unspecified: Secondary | ICD-10-CM | POA: Diagnosis present

## 2023-05-28 DIAGNOSIS — Z9842 Cataract extraction status, left eye: Secondary | ICD-10-CM | POA: Diagnosis not present

## 2023-05-28 DIAGNOSIS — Z79899 Other long term (current) drug therapy: Secondary | ICD-10-CM

## 2023-05-28 DIAGNOSIS — Z860100 Personal history of colon polyps, unspecified: Secondary | ICD-10-CM

## 2023-05-28 DIAGNOSIS — N390 Urinary tract infection, site not specified: Secondary | ICD-10-CM | POA: Diagnosis present

## 2023-05-28 DIAGNOSIS — E860 Dehydration: Secondary | ICD-10-CM | POA: Diagnosis present

## 2023-05-28 DIAGNOSIS — K219 Gastro-esophageal reflux disease without esophagitis: Secondary | ICD-10-CM | POA: Diagnosis present

## 2023-05-28 DIAGNOSIS — Z888 Allergy status to other drugs, medicaments and biological substances status: Secondary | ICD-10-CM

## 2023-05-28 DIAGNOSIS — C3411 Malignant neoplasm of upper lobe, right bronchus or lung: Secondary | ICD-10-CM | POA: Diagnosis present

## 2023-05-28 DIAGNOSIS — Z9841 Cataract extraction status, right eye: Secondary | ICD-10-CM

## 2023-05-28 DIAGNOSIS — K7689 Other specified diseases of liver: Secondary | ICD-10-CM | POA: Diagnosis present

## 2023-05-28 DIAGNOSIS — Z87891 Personal history of nicotine dependence: Secondary | ICD-10-CM

## 2023-05-28 DIAGNOSIS — E871 Hypo-osmolality and hyponatremia: Secondary | ICD-10-CM | POA: Diagnosis present

## 2023-05-28 DIAGNOSIS — I2510 Atherosclerotic heart disease of native coronary artery without angina pectoris: Secondary | ICD-10-CM | POA: Diagnosis present

## 2023-05-28 DIAGNOSIS — D61810 Antineoplastic chemotherapy induced pancytopenia: Secondary | ICD-10-CM | POA: Diagnosis present

## 2023-05-28 DIAGNOSIS — E782 Mixed hyperlipidemia: Secondary | ICD-10-CM | POA: Diagnosis present

## 2023-05-28 DIAGNOSIS — J439 Emphysema, unspecified: Secondary | ICD-10-CM | POA: Diagnosis present

## 2023-05-28 DIAGNOSIS — Z961 Presence of intraocular lens: Secondary | ICD-10-CM | POA: Diagnosis present

## 2023-05-28 DIAGNOSIS — I10 Essential (primary) hypertension: Secondary | ICD-10-CM | POA: Diagnosis present

## 2023-05-28 DIAGNOSIS — Z9221 Personal history of antineoplastic chemotherapy: Secondary | ICD-10-CM

## 2023-05-28 DIAGNOSIS — R197 Diarrhea, unspecified: Secondary | ICD-10-CM

## 2023-05-28 DIAGNOSIS — T451X5A Adverse effect of antineoplastic and immunosuppressive drugs, initial encounter: Secondary | ICD-10-CM | POA: Diagnosis present

## 2023-05-28 DIAGNOSIS — Z9851 Tubal ligation status: Secondary | ICD-10-CM

## 2023-05-28 LAB — COMPREHENSIVE METABOLIC PANEL WITH GFR
ALT: 17 U/L (ref 0–44)
AST: 18 U/L (ref 15–41)
Albumin: 3.1 g/dL — ABNORMAL LOW (ref 3.5–5.0)
Alkaline Phosphatase: 40 U/L (ref 38–126)
Anion gap: 8 (ref 5–15)
BUN: 26 mg/dL — ABNORMAL HIGH (ref 8–23)
CO2: 22 mmol/L (ref 22–32)
Calcium: 8.7 mg/dL — ABNORMAL LOW (ref 8.9–10.3)
Chloride: 101 mmol/L (ref 98–111)
Creatinine, Ser: 0.63 mg/dL (ref 0.44–1.00)
GFR, Estimated: >60 mL/min (ref >60)
Glucose, Bld: 137 mg/dL — ABNORMAL HIGH (ref 70–99)
Potassium: 4.1 mmol/L (ref 3.5–5.1)
Sodium: 131 mmol/L — ABNORMAL LOW (ref 135–145)
Total Bilirubin: 0.8 mg/dL (ref 0.0–1.2)
Total Protein: 5.5 g/dL — ABNORMAL LOW (ref 6.5–8.1)

## 2023-05-28 LAB — CBC WITH DIFFERENTIAL/PLATELET
Abs Immature Granulocytes: 0.02 10*3/uL (ref 0.00–0.07)
Basophils Absolute: 0 10*3/uL (ref 0.0–0.1)
Basophils Relative: 0 %
Eosinophils Absolute: 0.1 10*3/uL (ref 0.0–0.5)
Eosinophils Relative: 1 %
HCT: 38.5 % (ref 36.0–46.0)
Hemoglobin: 12.9 g/dL (ref 12.0–15.0)
Immature Granulocytes: 0 %
Lymphocytes Relative: 19 %
Lymphs Abs: 1 10*3/uL (ref 0.7–4.0)
MCH: 33.1 pg (ref 26.0–34.0)
MCHC: 33.5 g/dL (ref 30.0–36.0)
MCV: 98.7 fL (ref 80.0–100.0)
Monocytes Absolute: 0.4 10*3/uL (ref 0.1–1.0)
Monocytes Relative: 7 %
Neutro Abs: 3.7 10*3/uL (ref 1.7–7.7)
Neutrophils Relative %: 73 %
Platelets: 157 10*3/uL (ref 150–400)
RBC: 3.9 MIL/uL (ref 3.87–5.11)
RDW: 12.5 % (ref 11.5–15.5)
WBC: 5.2 10*3/uL (ref 4.0–10.5)
nRBC: 0 % (ref 0.0–0.2)

## 2023-05-28 LAB — LACTIC ACID, PLASMA
Lactic Acid, Venous: 0.9 mmol/L (ref 0.5–1.9)
Lactic Acid, Venous: 0.9 mmol/L (ref 0.5–1.9)

## 2023-05-28 LAB — URINALYSIS, W/ REFLEX TO CULTURE (INFECTION SUSPECTED)
Bilirubin Urine: NEGATIVE
Glucose, UA: NEGATIVE mg/dL
Ketones, ur: NEGATIVE mg/dL
Nitrite: NEGATIVE
Protein, ur: 100 mg/dL — AB
Specific Gravity, Urine: 1.01 (ref 1.005–1.030)
WBC, UA: >50 WBC/hpf (ref 0–5)
pH: 6 (ref 5.0–8.0)

## 2023-05-28 LAB — TROPONIN I (HIGH SENSITIVITY)
Troponin I (High Sensitivity): 6 ng/L (ref <18)
Troponin I (High Sensitivity): 7 ng/L (ref <18)

## 2023-05-28 LAB — LIPASE, BLOOD: Lipase: 34 U/L (ref 11–51)

## 2023-05-28 MED ORDER — SODIUM CHLORIDE 0.9 % IV SOLN
1.0000 g | Freq: Once | INTRAVENOUS | Status: AC
  Administered 2023-05-28: 1 g via INTRAVENOUS
  Filled 2023-05-28: qty 10

## 2023-05-28 MED ORDER — ATENOLOL 50 MG PO TABS
50.0000 mg | ORAL_TABLET | Freq: Every day | ORAL | Status: DC
  Administered 2023-05-28 – 2023-05-29 (×2): 50 mg via ORAL
  Filled 2023-05-28 (×2): qty 1

## 2023-05-28 MED ORDER — SODIUM CHLORIDE 0.9 % IV BOLUS
1000.0000 mL | Freq: Once | INTRAVENOUS | Status: AC
  Administered 2023-05-28: 1000 mL via INTRAVENOUS

## 2023-05-28 MED ORDER — SIMVASTATIN 20 MG PO TABS
20.0000 mg | ORAL_TABLET | ORAL | Status: DC
  Administered 2023-05-28 – 2023-05-30 (×3): 20 mg via ORAL
  Filled 2023-05-28: qty 1
  Filled 2023-05-28: qty 2
  Filled 2023-05-28: qty 1

## 2023-05-28 MED ORDER — LORATADINE 10 MG PO TABS
10.0000 mg | ORAL_TABLET | Freq: Every day | ORAL | Status: DC
  Administered 2023-05-28 – 2023-05-30 (×3): 10 mg via ORAL
  Filled 2023-05-28 (×3): qty 1

## 2023-05-28 MED ORDER — FLUTICASONE PROPIONATE 50 MCG/ACT NA SUSP: 1.0000 | Freq: Every day | NASAL | Status: DC | PRN

## 2023-05-28 MED ORDER — SODIUM CHLORIDE 0.9 % IV SOLN: 1.0000 g | INTRAVENOUS | Status: DC

## 2023-05-28 MED ORDER — ENOXAPARIN SODIUM 40 MG/0.4ML IJ SOSY
40.0000 mg | PREFILLED_SYRINGE | INTRAMUSCULAR | Status: DC
  Administered 2023-05-28 – 2023-05-30 (×3): 40 mg via SUBCUTANEOUS
  Filled 2023-05-28 (×3): qty 0.4

## 2023-05-28 MED ORDER — AMLODIPINE BESYLATE 5 MG PO TABS
5.0000 mg | ORAL_TABLET | Freq: Every day | ORAL | Status: DC
  Administered 2023-05-28 – 2023-05-29 (×2): 5 mg via ORAL
  Filled 2023-05-28 (×2): qty 1

## 2023-05-28 MED ORDER — SODIUM CHLORIDE 0.9 % IV SOLN
INTRAVENOUS | Status: AC
  Administered 2023-05-28: 100 mL/h via INTRAVENOUS

## 2023-05-28 MED ORDER — SODIUM CHLORIDE 0.9 % IV SOLN
2.0000 g | INTRAVENOUS | Status: DC
  Administered 2023-05-28 – 2023-05-29 (×2): 2 g via INTRAVENOUS
  Filled 2023-05-28 (×3): qty 20

## 2023-05-28 MED ORDER — ENSURE ENLIVE PO LIQD
237.0000 mL | Freq: Two times a day (BID) | ORAL | Status: DC
  Administered 2023-05-29 – 2023-05-30 (×3): 237 mL via ORAL

## 2023-05-28 MED ORDER — ONDANSETRON HCL 4 MG PO TABS: 8.0000 mg | ORAL_TABLET | Freq: Three times a day (TID) | ORAL | Status: DC | PRN

## 2023-05-28 MED ORDER — DM-GUAIFENESIN ER 30-600 MG PO TB12: 1.0000 | ORAL_TABLET | Freq: Two times a day (BID) | ORAL | Status: DC | PRN

## 2023-05-28 MED ORDER — METRONIDAZOLE 500 MG/100ML IV SOLN
500.0000 mg | Freq: Two times a day (BID) | INTRAVENOUS | Status: DC
  Administered 2023-05-28 – 2023-05-30 (×4): 500 mg via INTRAVENOUS
  Filled 2023-05-28 (×5): qty 100

## 2023-05-28 MED ORDER — METRONIDAZOLE 500 MG/100ML IV SOLN
500.0000 mg | Freq: Once | INTRAVENOUS | Status: AC
  Administered 2023-05-28: 500 mg via INTRAVENOUS
  Filled 2023-05-28: qty 100

## 2023-05-28 MED ORDER — DONEPEZIL HCL 5 MG PO TABS
10.0000 mg | ORAL_TABLET | Freq: Every day | ORAL | Status: DC
  Administered 2023-05-28 – 2023-05-29 (×2): 10 mg via ORAL
  Filled 2023-05-28 (×2): qty 2

## 2023-05-28 NOTE — ED Triage Notes (Signed)
 BIB ems from home for lowe abd pain, diarrhea, and 2 near syncopal episodes today  Pt is getting chemo and radiation, last radiation was 05/27/23, last chemo 05/24/23  Per pt diarrhea has been ongoing for about a week Pt a&ox4

## 2023-05-28 NOTE — ED Notes (Signed)
 Pt reports lower abdominal pain, cramping feeling with diarrhea for the past 3 days. Pt is awake, alert and oriented. Able to ambulate to restroom independently.

## 2023-05-28 NOTE — Plan of Care (Signed)
  Problem: Education: Goal: Knowledge of General Education information will improve Description: Including pain rating scale, medication(s)/side effects and non-pharmacologic comfort measures Outcome: Progressing   Problem: Health Behavior/Discharge Planning: Goal: Ability to manage health-related needs will improve Outcome: Progressing   Problem: Activity: Goal: Risk for activity intolerance will decrease Outcome: Progressing   Problem: Nutrition: Goal: Adequate nutrition will be maintained Outcome: Progressing   Problem: Coping: Goal: Level of anxiety will decrease Outcome: Progressing   Problem: Elimination: Goal: Will not experience complications related to bowel motility Outcome: Progressing   Problem: Pain Managment: Goal: General experience of comfort will improve and/or be controlled Outcome: Progressing   Problem: Safety: Goal: Ability to remain free from injury will improve Outcome: Progressing   Problem: Skin Integrity: Goal: Risk for impaired skin integrity will decrease Outcome: Progressing

## 2023-05-28 NOTE — H&P (Signed)
 History and Physical    Heather [name] MVH:[id number] DOB: 1936-01-17 DOA: 05/28/2023  PCP: Sari Cunning, MD (Confirm with patient/family/NH records and if not entered, this has to be entered at [facility] point of entry) Patient coming from: Home  I have personally briefly reviewed patient's old medical records in [facility]  Chief Complaint: Abdominal pain, diarrhea  HPI: Heather [name] is a 88 y.o. female with medical history significant of NSCLC with recurrence, on chemoradiation therapy, HTN, HLD, chronic left UPJ obstruction on chronic indwelling left ureteral stenting presented with worsening of abdominal pain and diarrhea and chills at home.  Patient started to have left lower quadrant cramping-like abdominal pain and diarrhea for the last 5+ days, intermittent and positive for tenesmus, diarrhea has been watery, no foul smell.  She been feeling episode chills but no fever.  Symptoms began to get worse yesterday and lost appetite and became dehydrated and had to near syncope episode when she felt lightheaded last night and decided come to ED.  Patient was started on chemo and radiation therapy 2 weeks ago, for suspected recurrence of lung CA received weekly chemotherapy this Tuesday and radiation therapy 4 times a week and last session was yesterday.  No history of cellulitis before.  She denies any flank pain no dysuria.  She took 1 Imodium this morning but continued to have watery diarrhea.  ED Course: Afebrile, no tachycardia no hypotension not hypoxic.  Blood work showed WBC 5.2 hemoglobin 12.9 platelets 157 sodium 121 potassium 4.1 BUN 26 creatinine 0.6 glucose 137.  Patient was given IV fluid 1000 mL normal saline and started on ceftriaxone  and Flagyl  Review of Systems: As per HPI otherwise 14 point review of systems negative.    Past Medical History:  Diagnosis Date   Anginal pain (HCC)    Benign essential hypertension    Brain aneurysm    Carotid artery stenosis    Carotid  stenosis 06/19/2013   Colonic polyp    Emphysema lung (HCC)    GERD (gastroesophageal reflux disease)    Hiatal hernia    Hypercholesteremia    Hyperlipidemia, mixed 06/09/2015   Lung cancer (HCC) 2016   Lung nodule 06/21/2014   Macrocytic 10/06/2014   Multiple thyroid  nodules    PAT (paroxysmal atrial tachycardia) (HCC) 06/19/2013   Plantar fasciitis     Past Surgical History:  Procedure Laterality Date   BREAST EXCISIONAL BIOPSY Right 40 yrs ago   neg   BREAST LUMPECTOMY     benign   CATARACT EXTRACTION W/ INTRAOCULAR LENS  IMPLANT, BILATERAL Bilateral    CEREBRAL ANEURYSM REPAIR  2004   coil treatment   CERVIX LESION DESTRUCTION     COLONOSCOPY     1995, 2006, 2014   COLONOSCOPY WITH PROPOFOL  N/A 03/31/2015   Procedure: COLONOSCOPY WITH PROPOFOL ;  Surgeon: Cassie Click, MD;  Location: [facility] ENDOSCOPY;  Service: Endoscopy;  Laterality: N/A;   CYSTOSCOPY W/ RETROGRADES Left 03/02/2022   Procedure: CYSTOSCOPY WITH RETROGRADE PYELOGRAM;  Surgeon: Geraline Knapp, MD;  Location: [facility] ORS;  Service: Urology;  Laterality: Left;   CYSTOSCOPY W/ RETROGRADES Left 12/28/2022   Procedure: CYSTOSCOPY WITH RETROGRADE PYELOGRAM;  Surgeon: Geraline Knapp, MD;  Location: [facility] ORS;  Service: Urology;  Laterality: Left;   CYSTOSCOPY W/ URETERAL STENT PLACEMENT Left 03/03/2021   Procedure: CYSTOSCOPY WITH EXCHANGE;  Surgeon: Geraline Knapp, MD;  Location: [facility] ORS;  Service: Urology;  Laterality: Left;   CYSTOSCOPY W/ URETERAL STENT PLACEMENT  Left 03/02/2022   Procedure: CYSTOSCOPY WITH STENT EXCHANGE;  Surgeon: Geraline Knapp, MD;  Location: [facility] ORS;  Service: Urology;  Laterality: Left;   CYSTOSCOPY W/ URETERAL STENT PLACEMENT Left 12/28/2022   Procedure: CYSTOSCOPY WITH STENT EXCHANGE;  Surgeon: Geraline Knapp, MD;  Location: [facility] ORS;  Service: Urology;  Laterality: Left;   CYSTOSCOPY WITH STENT PLACEMENT Left 11/25/2020   Procedure: CYSTOSCOPY WITH STENT PLACEMENT;  Surgeon:  Geraline Knapp, MD;  Location: [facility] ORS;  Service: Urology;  Laterality: Left;   ENDOBRONCHIAL ULTRASOUND Bilateral 04/19/2023   Procedure: ENDOBRONCHIAL ULTRASOUND (EBUS);  Surgeon: Vergia Glasgow, MD;  Location: [facility] ORS;  Service: Pulmonary;  Laterality: Bilateral;   FLEXIBLE BRONCHOSCOPY N/A 03/10/2017   Procedure: FLEXIBLE BRONCHOSCOPY;  Surgeon: Petra Brandy, MD;  Location: [facility] ORS;  Service: Thoracic;  Laterality: N/A;   THORACOSCOPY WITH WEDGE RESECTION LUNG Right 03/13/2014   RUL   THORACOTOMY/LOBECTOMY Left 03/10/2017   Procedure: THORACOTOMY WITH LUNG WEDGE RESECTION POSSIBLE LOBECTOMY;  Surgeon: Petra Brandy, MD;  Location: [facility] ORS;  Service: Thoracic;  Laterality: Left;   TUBAL LIGATION       reports that she quit smoking about 34 years ago. Her smoking use included cigarettes. She started smoking about 70 years ago. She has a 35 pack-year smoking history. She has never used smokeless tobacco. She reports current alcohol use. She reports that she does not use drugs.  Allergies  Allergen Reactions   Ace Inhibitors Swelling   Contrast Media [Iodinated Contrast Media]    Cortisone     Patient had facial swelling and itching from cortisone injection IM    Family History  Problem Relation Age of Onset   Alcohol abuse Mother    Heart attack Father    Breast cancer Neg Hx      Prior to Admission medications   Medication Sig Start Date End Date Taking? Authorizing Provider  amLODipine (NORVASC) 5 MG tablet Take 5 mg by mouth at bedtime. 06/10/17  Yes [provider]  amoxicillin  (AMOXIL ) 875 MG tablet Take 875 mg by mouth as needed.   Yes [provider]  calcium -vitamin D  (OSCAL WITH D) 500-200 MG-UNIT tablet Take 1 tablet by mouth daily with breakfast.   Yes [provider]  cetirizine (ZYRTEC) 10 MG tablet Take 10 mg by mouth daily.   Yes [provider]  dextromethorphan-guaiFENesin (MUCINEX DM) 30-600 MG 12hr tablet Take 1 tablet  by mouth 2 (two) times daily as needed for cough.   Yes [provider]  donepezil (ARICEPT) 10 MG tablet Take 1 tablet by mouth at bedtime. 11/01/22 11/01/23 Yes [provider]  fluticasone  (FLONASE ) 50 MCG/ACT nasal spray Place 1 spray into the nose daily as needed for allergies.  04/05/15  Yes [provider]  ondansetron  (ZOFRAN ) 8 MG tablet Take 1 tablet (8 mg total) by mouth every 8 (eight) hours as needed for nausea or vomiting. Start on the third day after chemotherapy. 04/27/23  Yes Timmy Forbes, MD  simvastatin  (ZOCOR ) 20 MG tablet Take 20 mg by mouth every morning.   Yes [provider]  vitamin B-12 (CYANOCOBALAMIN ) 100 MCG tablet Take 100 mcg by mouth daily.   Yes [provider]  atenolol  (TENORMIN ) 50 MG tablet Take 50 mg by mouth at bedtime.    [provider]  prochlorperazine  (COMPAZINE ) 10 MG tablet Take 1 tablet (10 mg total) by mouth every 6 (six) hours as needed for nausea or vomiting. Patient not taking: Reported on 05/28/2023  04/27/23   Timmy Forbes, MD    Physical Exam: Vitals:   05/28/23 0216 05/28/23 0600 05/28/23 0607 05/28/23 0711  BP:  (!) 134/58  (!) 153/65  Pulse:  88 89 95  Resp:   19 16  Temp:    98.6 F (37 C)  TempSrc:    Oral  SpO2:   92% 94%  Weight: 56.7 kg     Height: 5\' 2"  (1.575 m)       Constitutional: NAD, calm, comfortable Vitals:   05/28/23 0216 05/28/23 0600 05/28/23 0607 05/28/23 0711  BP:  (!) 134/58  (!) 153/65  Pulse:  88 89 95  Resp:   19 16  Temp:    98.6 F (37 C)  TempSrc:    Oral  SpO2:   92% 94%  Weight: 56.7 kg     Height: 5\' 2"  (1.575 m)      Eyes: PERRL, lids and conjunctivae normal ENMT: Mucous membranes are dry. Posterior pharynx clear of any exudate or lesions.Normal dentition.  Neck: normal, supple, no masses, no thyromegaly Respiratory: clear to auscultation bilaterally, no wheezing, no crackles. Normal respiratory effort. No accessory muscle use.  Cardiovascular: Regular  rate and rhythm, no murmurs / rubs / gallops. No extremity edema. 2+ pedal pulses. No carotid bruits.  Abdomen: Mild tenderness on left lower quadrant, no acute rebound or guarding, no masses palpated. No hepatosplenomegaly. Bowel sounds positive.  Musculoskeletal: no clubbing / cyanosis. No joint deformity upper and lower extremities. Good ROM, no contractures. Normal muscle tone.  Skin: no rashes, lesions, ulcers. No induration Neurologic: CN 2-12 grossly intact. Sensation intact, DTR normal. Strength 5/5 in all 4.  Psychiatric: Normal judgment and insight. Alert and oriented x 3. Normal mood.     Labs on Admission: I have personally reviewed following labs and imaging studies  CBC: Recent Labs  Lab 05/23/23 0834 05/28/23 0220  WBC 4.5 5.2  NEUTROABS 2.3 3.7  HGB 14.8 12.9  HCT 44.8 38.5  MCV 98.9 98.7  PLT 192 157   Basic Metabolic Panel: Recent Labs  Lab 05/23/23 0834 05/28/23 0220  NA 139 131*  K 4.6 4.1  CL 104 101  CO2 27 22  GLUCOSE 69* 137*  BUN 42* 26*  CREATININE 0.82 0.63  CALCIUM  9.6 8.7*   GFR: Estimated Creatinine Clearance: 40.7 mL/min (by C-G formula based on SCr of 0.63 mg/dL). Liver Function Tests: Recent Labs  Lab 05/23/23 0834 05/28/23 0220  AST 20 18  ALT 20 17  ALKPHOS 57 40  BILITOT 0.3 0.8  PROT 6.7 5.5*  ALBUMIN 4.0 3.1*   Recent Labs  Lab 05/28/23 0220  LIPASE 34   No results for input(s): "AMMONIA" in the last 168 hours. Coagulation Profile: No results for input(s): "INR", "PROTIME" in the last 168 hours. Cardiac Enzymes: No results for input(s): "CKTOTAL", "CKMB", "CKMBINDEX", "TROPONINI" in the last 168 hours. BNP (last 3 results) No results for input(s): "PROBNP" in the last 8760 hours. HbA1C: No results for input(s): "HGBA1C" in the last 72 hours. CBG: No results for input(s): "GLUCAP" in the last 168 hours. Lipid Profile: No results for input(s): "CHOL", "HDL", "LDLCALC", "TRIG", "CHOLHDL", "LDLDIRECT" in the last 72  hours. Thyroid  Function Tests: No results for input(s): "TSH", "T4TOTAL", "FREET4", "T3FREE", "THYROIDAB" in the last 72 hours. Anemia Panel: No results for input(s): "VITAMINB12", "FOLATE", "FERRITIN", "TIBC", "IRON", "RETICCTPCT" in the last 72 hours. Urine analysis:    Component Value Date/Time   COLORURINE YELLOW (A) 05/28/2023 4098  APPEARANCEUR CLOUDY (A) 05/28/2023 0332   APPEARANCEUR Cloudy (A) 02/15/2023 1110   LABSPEC 1.010 05/28/2023 0332   PHURINE 6.0 05/28/2023 0332   GLUCOSEU NEGATIVE 05/28/2023 0332   HGBUR MODERATE (A) 05/28/2023 0332   BILIRUBINUR NEGATIVE 05/28/2023 0332   BILIRUBINUR Negative 02/15/2023 1110   KETONESUR NEGATIVE 05/28/2023 0332   PROTEINUR 100 (A) 05/28/2023 0332   NITRITE NEGATIVE 05/28/2023 0332   LEUKOCYTESUR LARGE (A) 05/28/2023 0332    Radiological Exams on Admission: CT ABDOMEN PELVIS WO CONTRAST Result Date: 05/28/2023 CLINICAL DATA:  Lower abdominal pain and diarrhea. Near syncopal episodes. On chemo and radiation for lung carcinoma. EXAM: CT ABDOMEN AND PELVIS WITHOUT CONTRAST TECHNIQUE: Multidetector CT imaging of the abdomen and pelvis was performed following the standard protocol without IV contrast. RADIATION DOSE REDUCTION: This exam was performed according to the departmental dose-optimization program which includes automated exposure control, adjustment of the mA and/or kV according to patient size and/or use of iterative reconstruction technique. COMPARISON:  CT abdomen pelvis 11/25/2020 and PET/CT 03/30/2023 FINDINGS: Lower chest: Postsurgical changes left lower lobe. No acute abnormality. Hepatobiliary: Unchanged hepatic cysts in the left hepatic lobe. Hypoattenuating in the posterior right hepatic lobe on series 2/image 24 measures 6 mm and is too small to definitively characterize but appear similar to 11/25/2020. Unremarkable gallbladder and biliary tree. Pancreas: Unremarkable. Spleen: Unremarkable. Adrenals/Urinary Tract: Normal  adrenal glands. Marked left hydronephrosis has increased compared to 03/30/2023. The left ureteral stent is unchanged in position compared to recent PET/CT with the proximal loop in the distal portion of the dilated left renal pelvis. Stable right kidney. Unremarkable bladder. Stomach/Bowel: Small hiatal hernia. No bowel obstruction. Colonic diverticulosis. Wall thickening about the sigmoid colon with mild adjacent stranding. Normal appendix. Vascular/Lymphatic: Aortic atherosclerosis. No enlarged abdominal or pelvic lymph nodes. Reproductive: No acute abnormality. Other: Stranding about the sigmoid colon. No organized fluid collection or abscess. No free intraperitoneal air. Musculoskeletal: No acute fracture or destructive osseous lesion. IMPRESSION: 1. Acute uncomplicated sigmoid diverticulitis. Consider follow-up colonoscopy following resolution of patient's acute symptoms to exclude underlying mass. 2. Marked left hydronephrosis has increased compared to 03/30/2023. The left ureteral stent is unchanged in position compared to recent PET/CT with the proximal loop in the distal portion of the dilated left renal pelvis. Query stent dysfunction. 3. Aortic Atherosclerosis (ICD10-I70.0). Electronically Signed   By: Rozell Cornet M.D.   On: 05/28/2023 03:00    EKG: Independently reviewed.  Sinus rhythm, chronic nonspecific ST changes,  Assessment/Plan Principal Problem:   Sigmoid diverticulitis Active Problems:   Diverticulitis  (please populate well all problems here in Problem List. (For example, if patient is on BP meds at home and you resume or decide to hold them, it is a problem that needs to be her. Same for CAD, COPD, HLD and so on)  Acute diverticulitis - Continue Flagyl and ceftriaxone , and probiotics -Etiology considered to be related to chemotherapy of carboplatin  and Taxol ?  Patient has no history of constipation - Other DDx given the patient also has concurrent tenesmus, will check GI  pathogen panel - Discussed with patient regarding follow-up with GI in 6 to 8 weeks to have colonoscopy.  Question of dysfunctional indwelling left ureteral stent - Patient has no complaint of left flank pain or dysuria, UA does show some signs of concerning UTIs.  Will treat with antibiotics as above - Messaged urology to review the CT image study  Hyponatremia, acute - Mild volume contraction secondary to ongoing GI loss and poor intake -  IV fluid, then reevaluate sodium level tomorrow  HTN - Controlled, continue amlodipine and atenolol   Stage Ib lung CA, recurrent - Outpatient follow-up with oncology for ongoing chemo and radiation therapy, both started last week  DVT prophylaxis: Lovenox  Code Status: Full code Family Communication: None at bedside Disposition Plan: Patient is stable with acute Consults called: Messaged urology for review of CT scan Admission status: MedSurg admission   [place] MD [facility] Pager [phone]  05/28/2023, 10:02 AM

## 2023-05-28 NOTE — ED Provider Notes (Signed)
 [facility] Provider Note    Event Date/Time   First MD Initiated Contact with Patient 05/28/23 0222     (approximate)   History   Abdominal Pain and Near Syncope   HPI  Heather [name] is a 88 y.o. female   Past medical history of lung cancer on chemotherapy presents to the emergency department with lower abdominal pain and profuse watery diarrhea over the past 2 days.  She had 2 near syncopal episodes today but did not pass out or sustain any trauma.  She denies GI bleeding.  She is on chemotherapy and radiation with last treatment earlier this week.  She has shaking chills.  She denies fever.  She denies dysuria.  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, also left lower lobe mass in the lung, now on chemotherapy with radiation      Physical Exam   Triage Vital Signs: ED Triage Vitals  Encounter Vitals Group     BP 05/28/23 0215 137/70     Systolic BP Percentile --      Diastolic BP Percentile --      Pulse Rate 05/28/23 0215 88     Resp 05/28/23 0215 17     Temp 05/28/23 0215 99.9 F (37.7 C)     Temp Source 05/28/23 0215 Oral     SpO2 05/28/23 0215 97 %     Weight 05/28/23 0216 125 lb (56.7 kg)     Height 05/28/23 0216 5\' 2"  (1.575 m)     Head Circumference --      Peak Flow --      Pain Score --      Pain Loc --      Pain Education --      Exclude from Growth Chart --     Most recent vital signs: Vitals:   05/28/23 0215  BP: 137/70  Pulse: 88  Resp: 17  Temp: 99.9 F (37.7 C)  SpO2: 97%    General: Awake, no distress.  CV:  Good peripheral perfusion.  Resp:  Normal effort.  Abd:  No distention.  Other:  She has shaking chills.  She has no fever.  Her hemodynamics are  appropriate and reassuring, no fever or hypotension.  Heart rate is normal.  She has lower abdominal tenderness to palpation without rigidity or guarding.  She has dry mucous membranes.   ED Results / Procedures / Treatments   Labs (all labs ordered are listed, but only abnormal results are displayed) Labs Reviewed  COMPREHENSIVE METABOLIC PANEL WITH GFR - Abnormal; Notable for the following components:      Result Value   Sodium 131 (*)    Glucose, Bld 137 (*)    BUN 26 (*)    Calcium  8.7 (*)    Total Protein 5.5 (*)    Albumin 3.1 (*)    All other components within normal limits  CBC WITH DIFFERENTIAL/PLATELET  LIPASE, BLOOD  LACTIC ACID, PLASMA  LACTIC ACID, PLASMA  URINALYSIS, W/ REFLEX TO CULTURE (INFECTION SUSPECTED)  TROPONIN I (HIGH SENSITIVITY)     I ordered and reviewed the above labs they are notable for her white blood cell count is normal.  EKG  ED ECG REPORT I, Buell Carmin, the attending physician, personally viewed and interpreted  this ECG.   Date: 05/28/2023  EKG Time: 0234  Rate: 82  Rhythm: sinus  Axis: nl  Intervals:nl  ST&T Change: no stemi    RADIOLOGY I independently reviewed and interpreted CT of the abdomen pelvis and see left hydronephrosis with a stent I also reviewed radiologist's formal read.   PROCEDURES:  Critical Care performed: No  Procedures   MEDICATIONS ORDERED IN ED: Medications  cefTRIAXone  (ROCEPHIN ) 1 g in sodium chloride  0.9 % 100 mL IVPB (has no administration in time range)  metroNIDAZOLE (FLAGYL) IVPB 500 mg (has no administration in time range)  sodium chloride  0.9 % bolus 1,000 mL (1,000 mLs Intravenous New Bag/Given 05/28/23 0229)    External physician / consultants:  I spoke with hospital medicine for admission and regarding care plan for this patient.   IMPRESSION / MDM / ASSESSMENT AND PLAN / ED COURSE  I reviewed the triage vital signs and the nursing notes.                                Patient's  presentation is most consistent with acute presentation with potential threat to life or bodily function.  Differential diagnosis includes, but is not limited to, intra-abdominal infection, diverticulitis, urine tract infection, sepsis, colitis enteritis   The patient is on the cardiac monitor to evaluate for evidence of arrhythmia and/or significant heart rate changes.  MDM:    This is an immunocompromised patient here with shaking rigors/chills and profuse diarrhea for the last couple of days found to have uncomplicated sigmoid diverticulitis.  Fortunately her vital signs are normal with no leukocytosis and does not appear septic at this time.  However given her immunocompromise state, quite severe pain, shaking chills, I think she should be admitted and started on IV antibiotics.        FINAL CLINICAL IMPRESSION(S) / ED DIAGNOSES   Final diagnoses:  Sigmoid diverticulitis  Diarrhea, unspecified type  Near syncope  Personal history of chemotherapy     Rx / DC Orders   ED Discharge Orders     None        Note:  This document was prepared using Dragon voice recognition software and may include unintentional dictation errors.    Buell Carmin, MD 05/28/23 [phone]

## 2023-05-28 NOTE — ED Notes (Signed)
Pt ambulatory to bedside commode.

## 2023-05-28 NOTE — Progress Notes (Signed)
 Patient brought to the floor by transport personnel in bed, alert and orientated with out complaint. Iv nacl infusing to rt Ac, same intact. Made comfortable in bed, call bell given.

## 2023-05-28 NOTE — ED Notes (Signed)
Admitting MD at beside. 

## 2023-05-29 DIAGNOSIS — K5792 Diverticulitis of intestine, part unspecified, without perforation or abscess without bleeding: Secondary | ICD-10-CM | POA: Diagnosis not present

## 2023-05-29 DIAGNOSIS — K5732 Diverticulitis of large intestine without perforation or abscess without bleeding: Secondary | ICD-10-CM | POA: Diagnosis not present

## 2023-05-29 LAB — CBC
HCT: 34.9 % — ABNORMAL LOW (ref 36.0–46.0)
Hemoglobin: 11.5 g/dL — ABNORMAL LOW (ref 12.0–15.0)
MCH: 32.5 pg (ref 26.0–34.0)
MCHC: 33 g/dL (ref 30.0–36.0)
MCV: 98.6 fL (ref 80.0–100.0)
Platelets: 144 10*3/uL — ABNORMAL LOW (ref 150–400)
RBC: 3.54 MIL/uL — ABNORMAL LOW (ref 3.87–5.11)
RDW: 12.7 % (ref 11.5–15.5)
WBC: 3.7 10*3/uL — ABNORMAL LOW (ref 4.0–10.5)
nRBC: 0 % (ref 0.0–0.2)

## 2023-05-29 LAB — GASTROINTESTINAL PANEL BY PCR, STOOL (REPLACES STOOL CULTURE)

## 2023-05-29 LAB — C DIFFICILE QUICK SCREEN W PCR REFLEX
C Diff antigen: NEGATIVE
C Diff interpretation: NOT DETECTED
C Diff toxin: NEGATIVE

## 2023-05-29 NOTE — Plan of Care (Signed)

## 2023-05-29 NOTE — Progress Notes (Signed)
 PROGRESS NOTE    Heather [name]  ZHY:[id number] DOB: 07/07/1935 DOA: 05/28/2023 PCP: Sari Cunning, MD  Chief Complaint  Patient presents with   Abdominal Pain   Near Syncope    Hospital Course:  88 year old female with non-small cell lung cancer with recurrence on chemoradiation therapy, hypertension, hyperlipidemia, chronic left UPJ obstruction with chronic indwelling left ureteral stent who presents with abdominal pain and diarrhea.  Patient reports her watery diarrhea has been ongoing for 5 days with intermittent cramping.  Patient has become increasingly dehydrated and had near syncopal episode at home which prompted her to come to the ED. She was recently started on chemo and radiation 2 weeks ago for suspected recurrence of lung cancer and received most recent chemotherapy on 4/29. On arrival to the ED vital signs were stable, hemoglobin 12.9, WBC 5.2, platelets 157, sodium 121.  Patient was started on normal saline, ceftriaxone , Flagyl.  Subjective: This morning patient reports that she is starting to feel better.  She reports her loose stools have decreased in frequency.  She is still having some.  Is tolerating her diet without issue. she has some concerns about radiation and chemotherapy which she is scheduled to have tomorrow.   Objective: Vitals:   05/28/23 2128 05/28/23 2129 05/29/23 0458 05/29/23 0835  BP: 134/61 (!) 126/58 125/63 133/67  Pulse: 87 90 70 66  Resp: 18 18 18 18   Temp: 98.8 F (37.1 C) 98.8 F (37.1 C) 98.3 F (36.8 C) 98.1 F (36.7 C)  TempSrc:  Oral    SpO2: 94% 95% 94% 94%  Weight:      Height:       No intake or output data in the 24 hours ending 05/29/23 0941 Filed Weights   05/28/23 0216  Weight: 56.7 kg    Examination: General exam: Appears calm and comfortable, NAD  Respiratory system: No work of breathing, symmetric chest wall expansion Cardiovascular system: S1 & S2 heard, RRR.  Gastrointestinal system: Abdomen is nondistended, soft,  some suprapubic tenderness. Neuro: Alert and oriented. No focal neurological deficits. Extremities: Symmetric, expected ROM Skin: No rashes, lesions Psychiatry: Demonstrates appropriate judgement and insight. Mood & affect appropriate for situation.   Assessment & Plan:  Principal Problem:   Sigmoid diverticulitis Active Problems:   Diverticulitis   Diarrhea Sigmoid diverticulitis - CT reveals acute uncomplicated sigmoid diverticulitis.  Cannot rule out chemotherapy side effect as additional complication. - Continue with Flagyl, ceftriaxone , probiotics - Most recent chemotherapy 4/29 carboplatin  and Taxol  - GI PCR negative - C. difficile negative - Will need GI follow-up in 6 to 8 weeks to have colonoscopy once diverticulitis has resolved  Left hydronephrosis Indwelling left ureteral stent - Seen on CT - Urine culture growing Proteus, will follow for sensitivities.  Likely covered by current ceftriaxone . - Urology consulted with admitting MD and has requested for outpatient follow-up  Hyponatremia - Secondary to dehydration given ongoing GI losses - On IV fluids, continue - Trend CMP, has resolved  Hypertension - Continue current medications, titrate as needed  Pancytopenia - Secondary to chemotherapy as below.  High risk of infection - Continue to monitor closely  Stage Ib lung cancer, recurrent - Outpatient follow-up with oncology - Currently on chemo and radiation, follows with Dr. Wilhelmenia Harada - Scheduled for radiation tomorrow, chemo Tuesday.  Will reach out to Dr. Wilhelmenia Harada in the morning to alert the patient is in hospital.  Hopefully will discharge home in a.m.  May be able to get radiation before she goes  Hyperlipidemia - Continue statin  Hepatic cysts and left hepatic lobe - Incidentally seen on CT - Reportedly unchanged from 11/2020.  DVT prophylaxis: Lovenox    Code Status: Full Code Disposition: Inpatient for diverticulitis in immunocompromised patient.  Currently on  antibiotics.  Diarrhea resolving.  Continue to monitor.  IV fluids as needed.  Hopefully will discharge home tomorrow  Consultants:    Procedures:    Antimicrobials:  Anti-infectives (From admission, onward)    Start     Dose/Rate Route Frequency Ordered Stop   05/29/23 1000  cefTRIAXone  (ROCEPHIN ) 1 g in sodium chloride  0.9 % 100 mL IVPB  Status:  Discontinued        1 g 200 mL/hr over 30 Minutes Intravenous Every 24 hours 05/28/23 0857 05/28/23 0902   05/28/23 2000  metroNIDAZOLE (FLAGYL) IVPB 500 mg        500 mg 100 mL/hr over 60 Minutes Intravenous Every 12 hours 05/28/23 0857     05/28/23 1800  cefTRIAXone  (ROCEPHIN ) 2 g in sodium chloride  0.9 % 100 mL IVPB        2 g 200 mL/hr over 30 Minutes Intravenous Every 24 hours 05/28/23 0902     05/28/23 0315  cefTRIAXone  (ROCEPHIN ) 1 g in sodium chloride  0.9 % 100 mL IVPB        1 g 200 mL/hr over 30 Minutes Intravenous  Once 05/28/23 0307 05/28/23 0407   05/28/23 0315  metroNIDAZOLE (FLAGYL) IVPB 500 mg        500 mg 100 mL/hr over 60 Minutes Intravenous  Once 05/28/23 0307 05/28/23 0456       Data Reviewed: I have personally reviewed following labs and imaging studies CBC: Recent Labs  Lab 05/23/23 0834 05/28/23 0220 05/29/23 0405  WBC 4.5 5.2 3.7*  NEUTROABS 2.3 3.7  --   HGB 14.8 12.9 11.5*  HCT 44.8 38.5 34.9*  MCV 98.9 98.7 98.6  PLT 192 157 144*   Basic Metabolic Panel: Recent Labs  Lab 05/23/23 0834 05/28/23 0220  NA 139 131*  K 4.6 4.1  CL 104 101  CO2 27 22  GLUCOSE 69* 137*  BUN 42* 26*  CREATININE 0.82 0.63  CALCIUM  9.6 8.7*   GFR: Estimated Creatinine Clearance: 40.7 mL/min (by C-G formula based on SCr of 0.63 mg/dL). Liver Function Tests: Recent Labs  Lab 05/23/23 0834 05/28/23 0220  AST 20 18  ALT 20 17  ALKPHOS 57 40  BILITOT 0.3 0.8  PROT 6.7 5.5*  ALBUMIN 4.0 3.1*   CBG: No results for input(s): "GLUCAP" in the last 168 hours.  Recent Results (from the past 240 hours)   Gastrointestinal Panel by PCR , Stool     Status: None   Collection Time: 05/28/23  4:20 AM   Specimen: Stool  Result Value Ref Range Status   Campylobacter species NOT DETECTED NOT DETECTED Final   Plesimonas shigelloides NOT DETECTED NOT DETECTED Final   Salmonella species NOT DETECTED NOT DETECTED Final   Yersinia enterocolitica NOT DETECTED NOT DETECTED Final   Vibrio species NOT DETECTED NOT DETECTED Final   Vibrio cholerae NOT DETECTED NOT DETECTED Final   Enteroaggregative E coli (EAEC) NOT DETECTED NOT DETECTED Final   Enteropathogenic E coli (EPEC) NOT DETECTED NOT DETECTED Final   Enterotoxigenic E coli (ETEC) NOT DETECTED NOT DETECTED Final   Shiga like toxin producing E coli (STEC) NOT DETECTED NOT DETECTED Final   Shigella/Enteroinvasive E coli (EIEC) NOT DETECTED NOT DETECTED Final   Cryptosporidium NOT DETECTED NOT  DETECTED Final   Cyclospora cayetanensis NOT DETECTED NOT DETECTED Final   Entamoeba histolytica NOT DETECTED NOT DETECTED Final   Giardia lamblia NOT DETECTED NOT DETECTED Final   Adenovirus F40/41 NOT DETECTED NOT DETECTED Final   Astrovirus NOT DETECTED NOT DETECTED Final   Norovirus GI/GII NOT DETECTED NOT DETECTED Final   Rotavirus A NOT DETECTED NOT DETECTED Final   Sapovirus (I, II, IV, and V) NOT DETECTED NOT DETECTED Final    Comment: Performed at [facility], [address], [place], [place] [address]     Radiology Studies: CT ABDOMEN PELVIS WO CONTRAST Result Date: 05/28/2023 CLINICAL DATA:  Lower abdominal pain and diarrhea. Near syncopal episodes. On chemo and radiation for lung carcinoma. EXAM: CT ABDOMEN AND PELVIS WITHOUT CONTRAST TECHNIQUE: Multidetector CT imaging of the abdomen and pelvis was performed following the standard protocol without IV contrast. RADIATION DOSE REDUCTION: This exam was performed according to the departmental dose-optimization program which includes automated exposure control, adjustment of the mA and/or kV  according to patient size and/or use of iterative reconstruction technique. COMPARISON:  CT abdomen pelvis 11/25/2020 and PET/CT 03/30/2023 FINDINGS: Lower chest: Postsurgical changes left lower lobe. No acute abnormality. Hepatobiliary: Unchanged hepatic cysts in the left hepatic lobe. Hypoattenuating in the posterior right hepatic lobe on series 2/image 24 measures 6 mm and is too small to definitively characterize but appear similar to 11/25/2020. Unremarkable gallbladder and biliary tree. Pancreas: Unremarkable. Spleen: Unremarkable. Adrenals/Urinary Tract: Normal adrenal glands. Marked left hydronephrosis has increased compared to 03/30/2023. The left ureteral stent is unchanged in position compared to recent PET/CT with the proximal loop in the distal portion of the dilated left renal pelvis. Stable right kidney. Unremarkable bladder. Stomach/Bowel: Small hiatal hernia. No bowel obstruction. Colonic diverticulosis. Wall thickening about the sigmoid colon with mild adjacent stranding. Normal appendix. Vascular/Lymphatic: Aortic atherosclerosis. No enlarged abdominal or pelvic lymph nodes. Reproductive: No acute abnormality. Other: Stranding about the sigmoid colon. No organized fluid collection or abscess. No free intraperitoneal air. Musculoskeletal: No acute fracture or destructive osseous lesion. IMPRESSION: 1. Acute uncomplicated sigmoid diverticulitis. Consider follow-up colonoscopy following resolution of patient's acute symptoms to exclude underlying mass. 2. Marked left hydronephrosis has increased compared to 03/30/2023. The left ureteral stent is unchanged in position compared to recent PET/CT with the proximal loop in the distal portion of the dilated left renal pelvis. Query stent dysfunction. 3. Aortic Atherosclerosis (ICD10-I70.0). Electronically Signed   By: Rozell Cornet M.D.   On: 05/28/2023 03:00    Scheduled Meds:  amLODipine  5 mg Oral QHS   atenolol   50 mg Oral QHS   donepezil  10  mg Oral QHS   enoxaparin  (LOVENOX ) injection  40 mg Subcutaneous Q24H   feeding supplement  237 mL Oral BID BM   loratadine   10 mg Oral Daily   simvastatin   20 mg Oral [id number]   Continuous Infusions:  sodium chloride  100 mL/hr at 05/28/23 2138   cefTRIAXone  (ROCEPHIN )  IV 2 g (05/28/23 2151)   metronidazole 500 mg (05/28/23 2230)     LOS: 1 day  MDM: Patient is high risk for one or more organ failure.  They necessitate ongoing hospitalization for continued IV therapies and subsequent lab monitoring. Total time spent interpreting labs and vitals, reviewing the medical record, coordinating care amongst consultants and care team members, directly assessing and discussing care with the patient and/or family: 55 min  Johntay Doolen, DO [facility]  To contact the attending physician between 7A-7P please use  Epic Chat. To contact the covering physician during after hours 7P-7A, please review Amion.  05/29/2023, 9:41 AM   *This document has been created with the assistance of dictation software. Please excuse typographical errors. *

## 2023-05-30 ENCOUNTER — Other Ambulatory Visit: Payer: Self-pay | Admitting: Oncology

## 2023-05-30 ENCOUNTER — Telehealth: Payer: Self-pay

## 2023-05-30 ENCOUNTER — Ambulatory Visit

## 2023-05-30 ENCOUNTER — Ambulatory Visit
Admission: RE | Admit: 2023-05-30 | Discharge: 2023-05-30 | Disposition: A | Discharge status: Home / Self Care | Source: Ambulatory Visit | Attending: Radiation Oncology | Admitting: Radiation Oncology

## 2023-05-30 ENCOUNTER — Other Ambulatory Visit: Payer: Self-pay

## 2023-05-30 ENCOUNTER — Encounter: Payer: Self-pay | Admitting: Internal Medicine

## 2023-05-30 DIAGNOSIS — K5792 Diverticulitis of intestine, part unspecified, without perforation or abscess without bleeding: Secondary | ICD-10-CM | POA: Diagnosis not present

## 2023-05-30 DIAGNOSIS — C3432 Malignant neoplasm of lower lobe, left bronchus or lung: Secondary | ICD-10-CM

## 2023-05-30 DIAGNOSIS — C3411 Malignant neoplasm of upper lobe, right bronchus or lung: Secondary | ICD-10-CM

## 2023-05-30 DIAGNOSIS — K5732 Diverticulitis of large intestine without perforation or abscess without bleeding: Secondary | ICD-10-CM | POA: Diagnosis not present

## 2023-05-30 LAB — CBC WITH DIFFERENTIAL/PLATELET
Abs Immature Granulocytes: 0.01 10*3/uL (ref 0.00–0.07)
Basophils Absolute: 0 10*3/uL (ref 0.0–0.1)
Basophils Relative: 0 %
Eosinophils Absolute: 0.1 10*3/uL (ref 0.0–0.5)
Eosinophils Relative: 3 %
HCT: 35.5 % — ABNORMAL LOW (ref 36.0–46.0)
Hemoglobin: 11.9 g/dL — ABNORMAL LOW (ref 12.0–15.0)
Immature Granulocytes: 0 %
Lymphocytes Relative: 30 %
Lymphs Abs: 1 10*3/uL (ref 0.7–4.0)
MCH: 33.3 pg (ref 26.0–34.0)
MCHC: 33.5 g/dL (ref 30.0–36.0)
MCV: 99.4 fL (ref 80.0–100.0)
Monocytes Absolute: 0.3 10*3/uL (ref 0.1–1.0)
Monocytes Relative: 8 %
Neutro Abs: 2 10*3/uL (ref 1.7–7.7)
Neutrophils Relative %: 59 %
Platelets: 151 10*3/uL (ref 150–400)
RBC: 3.57 MIL/uL — ABNORMAL LOW (ref 3.87–5.11)
RDW: 12.5 % (ref 11.5–15.5)
WBC: 3.3 10*3/uL — ABNORMAL LOW (ref 4.0–10.5)
nRBC: 0 % (ref 0.0–0.2)

## 2023-05-30 LAB — COMPREHENSIVE METABOLIC PANEL WITH GFR
ALT: 16 U/L (ref 0–44)
AST: 17 U/L (ref 15–41)
Albumin: 3 g/dL — ABNORMAL LOW (ref 3.5–5.0)
Alkaline Phosphatase: 38 U/L (ref 38–126)
Anion gap: 9 (ref 5–15)
BUN: 13 mg/dL (ref 8–23)
CO2: 24 mmol/L (ref 22–32)
Calcium: 8.4 mg/dL — ABNORMAL LOW (ref 8.9–10.3)
Chloride: 106 mmol/L (ref 98–111)
Creatinine, Ser: 0.7 mg/dL (ref 0.44–1.00)
GFR, Estimated: >60 mL/min (ref >60)
Glucose, Bld: 141 mg/dL — ABNORMAL HIGH (ref 70–99)
Potassium: 3.9 mmol/L (ref 3.5–5.1)
Sodium: 139 mmol/L (ref 135–145)
Total Bilirubin: 0.4 mg/dL (ref 0.0–1.2)
Total Protein: 5.4 g/dL — ABNORMAL LOW (ref 6.5–8.1)

## 2023-05-30 LAB — URINE CULTURE: Culture: >=100000 — AB

## 2023-05-30 LAB — RAD ONC ARIA SESSION SUMMARY
Course Elapsed Days: 14
Plan Fractions Treated to Date: 11
Plan Prescribed Dose Per Fraction: 2 Gy
Plan Total Fractions Prescribed: 33
Plan Total Prescribed Dose: 66 Gy
Reference Point Dosage Given to Date: 22 Gy
Reference Point Session Dosage Given: 2 Gy
Session Number: 11

## 2023-05-30 MED ORDER — CEFDINIR 300 MG PO CAPS: 300.0000 mg | ORAL_CAPSULE | Freq: Two times a day (BID) | ORAL | 0 refills | Status: AC

## 2023-05-30 MED ORDER — METRONIDAZOLE 500 MG PO TABS: 500.0000 mg | ORAL_TABLET | Freq: Three times a day (TID) | ORAL | 0 refills | Status: AC

## 2023-05-30 NOTE — TOC Transition Note (Signed)
 Transition of Care [facility]) - Discharge Note   Patient Details  Name: Heather [name] MRN: [id number] Date of Birth: February 11, 1935  Transition [facility]) CM/SW Contact:  Crayton Docker, RN 05/30/2023, 1:52 PM   Clinical Narrative:     Discharge orders noted for home/self care. No case management identified.  Final next level of care: Home/Self Care Barriers to Discharge: No Barriers Identified   Patient Goals and CMS Choice    Home/self care   Discharge Placement    Home/self care           Discharge Plan and Services Additional resources added to the After Visit Summary for         Home/self care             Social Drivers of Health (SDOH) Interventions SDOH Screenings   Food Insecurity: No Food Insecurity (05/28/2023)  Housing: Low Risk  (05/28/2023)  Transportation Needs: No Transportation Needs (05/28/2023)  Utilities: Not At Risk (05/28/2023)  Financial Resource Strain: Low Risk  (05/05/2023)   Received from [facility]  Social Connections: Socially Integrated (05/28/2023)  Tobacco Use: Medium Risk (05/30/2023)     Readmission Risk Interventions     No data to display

## 2023-05-30 NOTE — Telephone Encounter (Signed)
 Pt is currenlty in the hospital and will be on antibiotics. Dr. Wilhelmenia Harada will defer treatment to next week. Please r/s and notify pt of new appt details.

## 2023-05-30 NOTE — Discharge Summary (Signed)
 Physician Discharge Summary   Patient: Heather [name] MRN: [id number] DOB: 1935-10-20  Admit date:     05/28/2023  Discharge date: 05/30/23  Discharge Physician: Roise Cleaver   PCP: Sari Cunning, MD   Recommendations at discharge:   Follow-up with your oncologist for chemotherapy as scheduled next week Follow-up with primary care to ensure resolution of diverticulitis.  Will need referral to gastroenterology for colonoscopy in 6 to 8 weeks. Follow-up with urology for stent removal/exchange  Discharge Diagnoses: Principal Problem:   Sigmoid diverticulitis Active Problems:   Diverticulitis  Resolved Problems:   * No resolved hospital problems. *  Hospital Course: 88 year old female with non-small cell lung cancer with recurrence on chemoradiation therapy, hypertension, hyperlipidemia, chronic left UPJ obstruction with chronic indwelling left ureteral stent who presents with abdominal pain and diarrhea.  Patient reports her watery diarrhea has been ongoing for 5 days with intermittent cramping.  Patient has become increasingly dehydrated and had near syncopal episode at home which prompted her to come to the ED.  She was recently started on chemo and radiation 2 weeks ago for suspected recurrence of lung cancer and received most recent chemotherapy on 4/29. On arrival to the ED vital signs were stable, hemoglobin 12.9, WBC 5.2, platelets 157, sodium 121.  Patient was started on normal saline, ceftriaxone , Flagyl.  CT scan consistent with uncomplicated sigmoid diverticulitis.  Gradually her loose stools resolved.  By reevaluation 5/5 patient had only had 1 bowel movement and reported feeling back to baseline and ready to discharge home. I discussed directly with her oncologist who confirmed patient should receive radiation as scheduled today but would move for chemotherapy to next week and allow time for antibiotic course completion.  I discussed this directly with the patient who endorses  understanding.   Diarrhea Sigmoid diverticulitis - CT reveals acute uncomplicated sigmoid diverticulitis.  Cannot rule out chemotherapy side effect as additional complication. - Continue with Flagyl, ceftriaxone , probiotics.  Complete course at home - Most recent chemotherapy 4/29 carboplatin  and Taxol  - GI PCR negative - C. difficile negative - Will need GI follow-up in 6 to 8 weeks to have colonoscopy once diverticulitis has resolved   Left hydronephrosis Indwelling left ureteral stent - Seen on CT - Urine culture growing Proteus, sensitive to cefdinir as prescribed for diverticulitis above. - Urology consulted with admitting MD and has requested for outpatient follow-up   Hyponatremia - Secondary to dehydration given ongoing GI losses - On IV fluids, continue - Trend CMP, has resolved   Hypertension - Continue current medications, titrate as needed   Pancytopenia - Secondary to chemotherapy as below.  High risk of infection - Continue to monitor closely   Stage Ib lung cancer, recurrent - Outpatient follow-up with oncology - Currently on chemo and radiation, follows with Dr. Wilhelmenia Harada - Dr. Wilhelmenia Harada has confirmed okay for radiation today.  Chemotherapy can be moved to next week  Hyperlipidemia - Continue statin   Hepatic cysts and left hepatic lobe - Incidentally seen on CT - Reportedly unchanged from 11/2020.   Disposition: Home Diet recommendation:  Discharge Diet Orders (From admission, onward)     Start     Ordered   05/30/23 0000  Diet general        05/30/23 1302           Regular diet DISCHARGE MEDICATION: Allergies as of 05/30/2023       Reactions   Ace Inhibitors Swelling   Contrast Media [iodinated Contrast Media]  Cortisone    Patient had facial swelling and itching from cortisone injection IM        Medication List     STOP taking these medications    amoxicillin  875 MG tablet Commonly known as: AMOXIL    prochlorperazine  10 MG  tablet Commonly known as: COMPAZINE        TAKE these medications    amLODipine 5 MG tablet Commonly known as: NORVASC Take 5 mg by mouth at bedtime.   atenolol  50 MG tablet Commonly known as: TENORMIN  Take 50 mg by mouth at bedtime.   calcium -vitamin D  500-200 MG-UNIT tablet Commonly known as: OSCAL WITH D Take 1 tablet by mouth daily with breakfast.   cefdinir 300 MG capsule Commonly known as: OMNICEF Take 1 capsule (300 mg total) by mouth 2 (two) times daily for 5 days.   cetirizine 10 MG tablet Commonly known as: ZYRTEC Take 10 mg by mouth daily.   cyanocobalamin  100 MCG tablet Commonly known as: VITAMIN B12 Take 100 mcg by mouth daily.   dextromethorphan-guaiFENesin 30-600 MG 12hr tablet Commonly known as: MUCINEX DM Take 1 tablet by mouth 2 (two) times daily as needed for cough.   donepezil 10 MG tablet Commonly known as: ARICEPT Take 1 tablet by mouth at bedtime.   fluticasone  50 MCG/ACT nasal spray Commonly known as: FLONASE  Place 1 spray into the nose daily as needed for allergies.   metroNIDAZOLE 500 MG tablet Commonly known as: Flagyl Take 1 tablet (500 mg total) by mouth 3 (three) times daily for 5 days.   ondansetron  8 MG tablet Commonly known as: Zofran  Take 1 tablet (8 mg total) by mouth every 8 (eight) hours as needed for nausea or vomiting. Start on the third day after chemotherapy.   simvastatin  20 MG tablet Commonly known as: ZOCOR  Take 20 mg by mouth every morning.        Follow-up Information     Sari Cunning, MD Follow up.   Specialty: Internal Medicine Why: Hospital follow up Contact information: [address] [facility] [place] [address] [phone]                Discharge Exam: Cleavon Curls Weights   05/28/23 0216  Weight: 56.7 kg   Constitutional:  Normal appearance. Non toxic-appearing.  HENT: Head Normocephalic and atraumatic.  Mucous membranes are moist.  Eyes:  Extraocular  intact. Conjunctivae normal. Pupils are equal, round, and reactive to light.  Cardiovascular: Rate and Rhythm: Normal rate and regular rhythm.  Pulmonary: Non labored, symmetric rise of chest wall.  Abdomen: Nondistended, soft, nontender Musculoskeletal:  Normal range of motion.  Skin: warm and dry. not jaundiced.  Neurological: No focal deficit present. alert. Oriented. Psychiatric: Mood and Affect congruent.    Condition at discharge: stable  The results of significant diagnostics from this hospitalization (including imaging, microbiology, ancillary and laboratory) are listed below for reference.   Imaging Studies: CT ABDOMEN PELVIS WO CONTRAST Result Date: 05/28/2023 CLINICAL DATA:  Lower abdominal pain and diarrhea. Near syncopal episodes. On chemo and radiation for lung carcinoma. EXAM: CT ABDOMEN AND PELVIS WITHOUT CONTRAST TECHNIQUE: Multidetector CT imaging of the abdomen and pelvis was performed following the standard protocol without IV contrast. RADIATION DOSE REDUCTION: This exam was performed according to the departmental dose-optimization program which includes automated exposure control, adjustment of the mA and/or kV according to patient size and/or use of iterative reconstruction technique. COMPARISON:  CT abdomen pelvis 11/25/2020 and PET/CT 03/30/2023 FINDINGS: Lower chest: Postsurgical  changes left lower lobe. No acute abnormality. Hepatobiliary: Unchanged hepatic cysts in the left hepatic lobe. Hypoattenuating in the posterior right hepatic lobe on series 2/image 24 measures 6 mm and is too small to definitively characterize but appear similar to 11/25/2020. Unremarkable gallbladder and biliary tree. Pancreas: Unremarkable. Spleen: Unremarkable. Adrenals/Urinary Tract: Normal adrenal glands. Marked left hydronephrosis has increased compared to 03/30/2023. The left ureteral stent is unchanged in position compared to recent PET/CT with the proximal loop in the distal portion of the  dilated left renal pelvis. Stable right kidney. Unremarkable bladder. Stomach/Bowel: Small hiatal hernia. No bowel obstruction. Colonic diverticulosis. Wall thickening about the sigmoid colon with mild adjacent stranding. Normal appendix. Vascular/Lymphatic: Aortic atherosclerosis. No enlarged abdominal or pelvic lymph nodes. Reproductive: No acute abnormality. Other: Stranding about the sigmoid colon. No organized fluid collection or abscess. No free intraperitoneal air. Musculoskeletal: No acute fracture or destructive osseous lesion. IMPRESSION: 1. Acute uncomplicated sigmoid diverticulitis. Consider follow-up colonoscopy following resolution of patient's acute symptoms to exclude underlying mass. 2. Marked left hydronephrosis has increased compared to 03/30/2023. The left ureteral stent is unchanged in position compared to recent PET/CT with the proximal loop in the distal portion of the dilated left renal pelvis. Query stent dysfunction. 3. Aortic Atherosclerosis (ICD10-I70.0). Electronically Signed   By: Rozell Cornet M.D.   On: 05/28/2023 03:00    Microbiology: Results for orders placed or performed during the hospital encounter of 05/28/23  Urine Culture     Status: Abnormal   Collection Time: 05/28/23  3:32 AM   Specimen: Urine, Random  Result Value Ref Range Status   Specimen Description   Final    URINE, RANDOM Performed at [facility], [address] [place], [place] [address]    Special Requests   Final    NONE Reflexed from [id number] Performed at [facility], [address] [place], [place] [address]    Culture >=100,000 COLONIES/mL PROTEUS MIRABILIS (A)  Final   Report Status 05/30/2023 FINAL  Final   Organism ID, Bacteria PROTEUS MIRABILIS (A)  Final      Susceptibility   Proteus mirabilis - MIC*    AMPICILLIN <=2 SENSITIVE Sensitive     CEFAZOLIN  <=4 SENSITIVE Sensitive     CEFEPIME  <=0.12 SENSITIVE Sensitive     CEFTRIAXONE  <=0.25 SENSITIVE Sensitive      CIPROFLOXACIN <=0.25 SENSITIVE Sensitive     GENTAMICIN <=1 SENSITIVE Sensitive     IMIPENEM 2 SENSITIVE Sensitive     NITROFURANTOIN 128 RESISTANT Resistant     TRIMETH /SULFA  <=20 SENSITIVE Sensitive     AMPICILLIN/SULBACTAM <=2 SENSITIVE Sensitive     PIP/TAZO <=4 SENSITIVE Sensitive ug/mL    * >=100,000 COLONIES/mL PROTEUS MIRABILIS  Gastrointestinal Panel by PCR , Stool     Status: None   Collection Time: 05/28/23  4:20 AM   Specimen: Stool  Result Value Ref Range Status   Campylobacter species NOT DETECTED NOT DETECTED Final   Plesimonas shigelloides NOT DETECTED NOT DETECTED Final   Salmonella species NOT DETECTED NOT DETECTED Final   Yersinia enterocolitica NOT DETECTED NOT DETECTED Final   Vibrio species NOT DETECTED NOT DETECTED Final   Vibrio cholerae NOT DETECTED NOT DETECTED Final   Enteroaggregative E coli (EAEC) NOT DETECTED NOT DETECTED Final   Enteropathogenic E coli (EPEC) NOT DETECTED NOT DETECTED Final   Enterotoxigenic E coli (ETEC) NOT DETECTED NOT DETECTED Final   Shiga like toxin producing E coli (STEC) NOT DETECTED NOT DETECTED Final   Shigella/Enteroinvasive E coli (  EIEC) NOT DETECTED NOT DETECTED Final   Cryptosporidium NOT DETECTED NOT DETECTED Final   Cyclospora cayetanensis NOT DETECTED NOT DETECTED Final   Entamoeba histolytica NOT DETECTED NOT DETECTED Final   Giardia lamblia NOT DETECTED NOT DETECTED Final   Adenovirus F40/41 NOT DETECTED NOT DETECTED Final   Astrovirus NOT DETECTED NOT DETECTED Final   Norovirus GI/GII NOT DETECTED NOT DETECTED Final   Rotavirus A NOT DETECTED NOT DETECTED Final   Sapovirus (I, II, IV, and V) NOT DETECTED NOT DETECTED Final    Comment: Performed at [facility], [address] [place], [place] [address]  C Difficile Quick Screen w PCR reflex     Status: None   Collection Time: 05/29/23  4:20 AM   Specimen: STOOL  Result Value Ref Range Status   C Diff antigen NEGATIVE NEGATIVE Final   C Diff toxin  NEGATIVE NEGATIVE Final   C Diff interpretation No C. difficile detected.  Final    Comment: Performed at [facility], [address] [place], [place] [address]    Labs: CBC: Recent Labs  Lab 05/28/23 0220 05/29/23 0405 05/30/23 0949  WBC 5.2 3.7* 3.3*  NEUTROABS 3.7  --  2.0  HGB 12.9 11.5* 11.9*  HCT 38.5 34.9* 35.5*  MCV 98.7 98.6 99.4  PLT 157 144* 151   Basic Metabolic Panel: Recent Labs  Lab 05/28/23 0220 05/30/23 0949  NA 131* 139  K 4.1 3.9  CL 101 106  CO2 22 24  GLUCOSE 137* 141*  BUN 26* 13  CREATININE 0.63 0.70  CALCIUM  8.7* 8.4*   Liver Function Tests: Recent Labs  Lab 05/28/23 0220 05/30/23 0949  AST 18 17  ALT 17 16  ALKPHOS 40 38  BILITOT 0.8 0.4  PROT 5.5* 5.4*  ALBUMIN 3.1* 3.0*   CBG: No results for input(s): "GLUCAP" in the last 168 hours.  Discharge time spent: 32 minutes.  Signed: Avi Kerschner, DO [facility] 05/30/2023

## 2023-05-31 ENCOUNTER — Inpatient Hospital Stay

## 2023-05-31 ENCOUNTER — Inpatient Hospital Stay: Admitting: Oncology

## 2023-05-31 ENCOUNTER — Ambulatory Visit
Admission: RE | Admit: 2023-05-31 | Discharge: 2023-05-31 | Disposition: A | Discharge status: Home / Self Care | Source: Ambulatory Visit | Attending: Radiation Oncology | Admitting: Radiation Oncology

## 2023-05-31 ENCOUNTER — Other Ambulatory Visit: Payer: Self-pay

## 2023-05-31 DIAGNOSIS — C3411 Malignant neoplasm of upper lobe, right bronchus or lung: Secondary | ICD-10-CM | POA: Diagnosis not present

## 2023-05-31 LAB — RAD ONC ARIA SESSION SUMMARY
Course Elapsed Days: 15
Plan Fractions Treated to Date: 12
Plan Prescribed Dose Per Fraction: 2 Gy
Plan Total Fractions Prescribed: 33
Plan Total Prescribed Dose: 66 Gy
Reference Point Dosage Given to Date: 24 Gy
Reference Point Session Dosage Given: 2 Gy
Session Number: 12

## 2023-06-01 ENCOUNTER — Ambulatory Visit
Admission: RE | Admit: 2023-06-01 | Discharge: 2023-06-01 | Disposition: A | Discharge status: Home / Self Care | Source: Ambulatory Visit | Attending: Radiation Oncology | Admitting: Radiation Oncology

## 2023-06-01 ENCOUNTER — Other Ambulatory Visit: Payer: Self-pay

## 2023-06-01 ENCOUNTER — Encounter: Payer: Self-pay | Admitting: Oncology

## 2023-06-01 DIAGNOSIS — C3411 Malignant neoplasm of upper lobe, right bronchus or lung: Secondary | ICD-10-CM | POA: Diagnosis not present

## 2023-06-01 LAB — RAD ONC ARIA SESSION SUMMARY
Course Elapsed Days: 16
Plan Fractions Treated to Date: 13
Plan Prescribed Dose Per Fraction: 2 Gy
Plan Total Fractions Prescribed: 33
Plan Total Prescribed Dose: 66 Gy
Reference Point Dosage Given to Date: 26 Gy
Reference Point Session Dosage Given: 2 Gy
Session Number: 13

## 2023-06-02 ENCOUNTER — Ambulatory Visit
Admission: RE | Admit: 2023-06-02 | Discharge: 2023-06-02 | Disposition: A | Discharge status: Home / Self Care | Source: Ambulatory Visit | Attending: Radiation Oncology | Admitting: Radiation Oncology

## 2023-06-02 ENCOUNTER — Other Ambulatory Visit: Payer: Self-pay

## 2023-06-02 DIAGNOSIS — C3411 Malignant neoplasm of upper lobe, right bronchus or lung: Secondary | ICD-10-CM | POA: Diagnosis not present

## 2023-06-02 LAB — RAD ONC ARIA SESSION SUMMARY
Course Elapsed Days: 17
Plan Fractions Treated to Date: 14
Plan Prescribed Dose Per Fraction: 2 Gy
Plan Total Fractions Prescribed: 33
Plan Total Prescribed Dose: 66 Gy
Reference Point Dosage Given to Date: 28 Gy
Reference Point Session Dosage Given: 2 Gy
Session Number: 14

## 2023-06-02 LAB — ACID FAST CULTURE WITH REFLEXED SENSITIVITIES (MYCOBACTERIA)

## 2023-06-03 ENCOUNTER — Other Ambulatory Visit: Payer: Self-pay

## 2023-06-03 ENCOUNTER — Ambulatory Visit
Admission: RE | Admit: 2023-06-03 | Discharge: 2023-06-03 | Disposition: A | Discharge status: Home / Self Care | Source: Ambulatory Visit | Attending: Radiation Oncology | Admitting: Radiation Oncology

## 2023-06-03 DIAGNOSIS — C3411 Malignant neoplasm of upper lobe, right bronchus or lung: Secondary | ICD-10-CM | POA: Diagnosis not present

## 2023-06-03 LAB — RAD ONC ARIA SESSION SUMMARY
Course Elapsed Days: 18
Plan Fractions Treated to Date: 15
Plan Prescribed Dose Per Fraction: 2 Gy
Plan Total Fractions Prescribed: 33
Plan Total Prescribed Dose: 66 Gy
Reference Point Dosage Given to Date: 30 Gy
Reference Point Session Dosage Given: 2 Gy
Session Number: 15

## 2023-06-06 ENCOUNTER — Inpatient Hospital Stay: Attending: Oncology

## 2023-06-06 ENCOUNTER — Inpatient Hospital Stay (HOSPITAL_BASED_OUTPATIENT_CLINIC_OR_DEPARTMENT_OTHER): Admitting: Oncology

## 2023-06-06 ENCOUNTER — Other Ambulatory Visit: Payer: Self-pay

## 2023-06-06 ENCOUNTER — Encounter: Payer: Self-pay | Admitting: *Deleted

## 2023-06-06 ENCOUNTER — Encounter: Payer: Self-pay | Admitting: Oncology

## 2023-06-06 ENCOUNTER — Inpatient Hospital Stay

## 2023-06-06 ENCOUNTER — Ambulatory Visit
Admission: RE | Admit: 2023-06-06 | Discharge: 2023-06-06 | Disposition: A | Discharge status: Home / Self Care | Source: Ambulatory Visit | Attending: Radiation Oncology | Admitting: Radiation Oncology

## 2023-06-06 VITALS — BP 142/63 | HR 70 | Temp 97.7°F | Resp 19 | Wt 128.4 lb

## 2023-06-06 DIAGNOSIS — Z79633 Long term (current) use of mitotic inhibitor: Secondary | ICD-10-CM | POA: Insufficient documentation

## 2023-06-06 DIAGNOSIS — C3432 Malignant neoplasm of lower lobe, left bronchus or lung: Secondary | ICD-10-CM | POA: Insufficient documentation

## 2023-06-06 DIAGNOSIS — Z79630 Long term (current) use of alkylating agent: Secondary | ICD-10-CM | POA: Diagnosis not present

## 2023-06-06 DIAGNOSIS — Z5111 Encounter for antineoplastic chemotherapy: Secondary | ICD-10-CM | POA: Insufficient documentation

## 2023-06-06 DIAGNOSIS — C3411 Malignant neoplasm of upper lobe, right bronchus or lung: Secondary | ICD-10-CM

## 2023-06-06 LAB — CBC WITH DIFFERENTIAL (CANCER CENTER ONLY)
Abs Immature Granulocytes: 0.02 10*3/uL (ref 0.00–0.07)
Basophils Absolute: 0 10*3/uL (ref 0.0–0.1)
Basophils Relative: 0 %
Eosinophils Absolute: 0 10*3/uL (ref 0.0–0.5)
Eosinophils Relative: 1 %
HCT: 43.9 % (ref 36.0–46.0)
Hemoglobin: 14.4 g/dL (ref 12.0–15.0)
Immature Granulocytes: 0 %
Lymphocytes Relative: 27 %
Lymphs Abs: 1.2 10*3/uL (ref 0.7–4.0)
MCH: 32.9 pg (ref 26.0–34.0)
MCHC: 32.8 g/dL (ref 30.0–36.0)
MCV: 100.2 fL — ABNORMAL HIGH (ref 80.0–100.0)
Monocytes Absolute: 0.5 10*3/uL (ref 0.1–1.0)
Monocytes Relative: 10 %
Neutro Abs: 2.9 10*3/uL (ref 1.7–7.7)
Neutrophils Relative %: 62 %
Platelet Count: 248 10*3/uL (ref 150–400)
RBC: 4.38 MIL/uL (ref 3.87–5.11)
RDW: 13.4 % (ref 11.5–15.5)
WBC Count: 4.6 10*3/uL (ref 4.0–10.5)
nRBC: 0 % (ref 0.0–0.2)

## 2023-06-06 LAB — CMP (CANCER CENTER ONLY)
ALT: 36 U/L (ref 0–44)
AST: 24 U/L (ref 15–41)
Albumin: 3.9 g/dL (ref 3.5–5.0)
Alkaline Phosphatase: 48 U/L (ref 38–126)
Anion gap: 7 (ref 5–15)
BUN: 23 mg/dL (ref 8–23)
CO2: 26 mmol/L (ref 22–32)
Calcium: 9.3 mg/dL (ref 8.9–10.3)
Chloride: 106 mmol/L (ref 98–111)
Creatinine: 0.85 mg/dL (ref 0.44–1.00)
GFR, Estimated: >60 mL/min (ref >60)
Glucose, Bld: 104 mg/dL — ABNORMAL HIGH (ref 70–99)
Potassium: 5 mmol/L (ref 3.5–5.1)
Sodium: 139 mmol/L (ref 135–145)
Total Bilirubin: 0.4 mg/dL (ref 0.0–1.2)
Total Protein: 6.6 g/dL (ref 6.5–8.1)

## 2023-06-06 LAB — RAD ONC ARIA SESSION SUMMARY
Course Elapsed Days: 21
Plan Fractions Treated to Date: 16
Plan Prescribed Dose Per Fraction: 2 Gy
Plan Total Fractions Prescribed: 33
Plan Total Prescribed Dose: 66 Gy
Reference Point Dosage Given to Date: 32 Gy
Reference Point Session Dosage Given: 2 Gy
Session Number: 16

## 2023-06-06 MED ORDER — FAMOTIDINE IN NACL 20-0.9 MG/50ML-% IV SOLN
20.0000 mg | Freq: Once | INTRAVENOUS | Status: AC
Start: 2023-06-06 — End: 2023-06-06
  Administered 2023-06-06: 20 mg via INTRAVENOUS
  Filled 2023-06-06: qty 50

## 2023-06-06 MED ORDER — SODIUM CHLORIDE 0.9 % IV SOLN
INTRAVENOUS | Status: DC
  Filled 2023-06-06: qty 250

## 2023-06-06 MED ORDER — PACLITAXEL CHEMO INJECTION 300 MG/50ML
50.0000 mg/m2 | Freq: Once | INTRAVENOUS | Status: AC
  Administered 2023-06-06: 78 mg via INTRAVENOUS
  Filled 2023-06-06: qty 13

## 2023-06-06 MED ORDER — SODIUM CHLORIDE 0.9 % IV SOLN
134.0000 mg | Freq: Once | INTRAVENOUS | Status: AC
  Administered 2023-06-06: 130 mg via INTRAVENOUS
  Filled 2023-06-06: qty 13

## 2023-06-06 MED ORDER — DIPHENHYDRAMINE HCL 50 MG/ML IJ SOLN
25.0000 mg | Freq: Once | INTRAMUSCULAR | Status: AC
  Administered 2023-06-06: 25 mg via INTRAVENOUS
  Filled 2023-06-06: qty 1

## 2023-06-06 MED ORDER — PALONOSETRON HCL INJECTION 0.25 MG/5ML
0.2500 mg | Freq: Once | INTRAVENOUS | Status: AC
  Administered 2023-06-06: 0.25 mg via INTRAVENOUS
  Filled 2023-06-06: qty 5

## 2023-06-06 MED ORDER — DEXAMETHASONE SODIUM PHOSPHATE 10 MG/ML IJ SOLN
10.0000 mg | Freq: Once | INTRAMUSCULAR | Status: AC
Start: 2023-06-06 — End: 2023-06-06
  Administered 2023-06-06: 10 mg via INTRAVENOUS
  Filled 2023-06-06: qty 1

## 2023-06-06 NOTE — Patient Instructions (Signed)
 [facility] - A DEPT OF [facility]  Discharge Instructions: Thank you for choosing [facility] to provide your oncology and hematology care.  If you have a lab appointment with the Cancer Center, please go directly to the Cancer Center and check in at the registration area.  Wear comfortable clothing and clothing appropriate for easy access to any Portacath or PICC line.   We strive to give you quality time with your provider. You may need to reschedule your appointment if you arrive late (15 or more minutes).  Arriving late affects you and other patients whose appointments are after yours.  Also, if you miss three or more appointments without notifying the office, you may be dismissed from the clinic at the provider's discretion.      For prescription refill requests, have your pharmacy contact our office and allow 72 hours for refills to be completed.    Today you received the following chemotherapy and/or immunotherapy agents Taxol & Carboplatin      To help prevent nausea and vomiting after your treatment, we encourage you to take your nausea medication as directed.  BELOW ARE SYMPTOMS THAT SHOULD BE REPORTED IMMEDIATELY: *FEVER GREATER THAN 100.4 F (38 C) OR HIGHER *CHILLS OR SWEATING *NAUSEA AND VOMITING THAT IS NOT CONTROLLED WITH YOUR NAUSEA MEDICATION *UNUSUAL SHORTNESS OF BREATH *UNUSUAL BRUISING OR BLEEDING *URINARY PROBLEMS (pain or burning when urinating, or frequent urination) *BOWEL PROBLEMS (unusual diarrhea, constipation, pain near the anus) TENDERNESS IN MOUTH AND THROAT WITH OR WITHOUT PRESENCE OF ULCERS (sore throat, sores in mouth, or a toothache) UNUSUAL RASH, SWELLING OR PAIN  UNUSUAL VAGINAL DISCHARGE OR ITCHING   Items with * indicate a potential emergency and should be followed up as soon as possible or go to the Emergency Department if any problems should occur.  Please show the CHEMOTHERAPY ALERT CARD or  IMMUNOTHERAPY ALERT CARD at check-in to the Emergency Department and triage nurse.  Should you have questions after your visit or need to cancel or reschedule your appointment, please contact [facility] - A DEPT OF [facility]  [phone] and follow the prompts.  Office hours are 8:00 a.m. to 4:30 p.m. Monday - Friday. Please note that voicemails left after 4:00 p.m. may not be returned until the following business day.  We are closed weekends and major holidays. You have access to a nurse at all times for urgent questions. Please call the main number to the clinic [phone] and follow the prompts.  For any non-urgent questions, you may also contact your provider using MyChart. We now offer e-Visits for anyone 67 and older to request care online for non-urgent symptoms. For details visit [email or web address].   Also download the MyChart app! Go to the app store, search "MyChart", open the app, select [facility], and log in with your MyChart username and password.

## 2023-06-06 NOTE — Assessment & Plan Note (Signed)
 Chemotherapy plan as listed above

## 2023-06-06 NOTE — Progress Notes (Signed)
 Hematology/Oncology Progress note [phone] [phone]     Chief Complaint:  Follow up for recurrent non small cell lung cancer   ASSESSMENT & PLAN:  Heather [name] is a 88 y.o. [name] female with stage IB adenocarcinoma of the right upper lobe status post wedge resection on 03/13/2014. T2aN1M0 (stage IB).  She is s/p wedge resection of a left lower lobe mass on 03/10/2017. T1aNx (stage IA). No with locally recurrent NSCLC   Malignant neoplasm of lower lobe of left lung (HCC) #History of stage Ib right lung upper lobe adenocarcinoma status post wedge resection in 2016 History of left lower lobe adenocarcinoma T1 a NX status post wedge resection 2019. Recurrence - Biopsy of pretracheal lymph node positive for NSCLC.  MRI brain is negative.  Recommend concurrent chemotherapy [carboplatin  taxol ]  with radiation.  Labs are reviewed and discussed with patient. Proceed with carboplatin  AUC2, taxol  45mg /m2.      Encounter for antineoplastic chemotherapy Chemotherapy plan as listed above.   Primary cancer of right upper lobe of lung [facility]) Same plan as above.    No orders of the defined types were placed in this encounter.  Follow up 1 week.   All questions were answered. The patient knows to call the clinic with any problems, questions or concerns.  We spent sufficient time to discuss many aspect of care, questions were answered to patient's satisfaction.   Timmy Forbes, MD, PhD [facility] Hematology Oncology 06/06/2023   PERTINENT ONCOLOGY HISTORY Previously follows up with Dr.Corcoran. [place] care with me on 03/15/2018  PET scan on 02/13/2014 revealed a 1 cm right upper lobe hypermetabolic nodule (SUV 3.8) and a 6 mm right upper lobe nodule (no metabolic activity). There was no mediastianl adenopathy.  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 Pathology revealed a 1.1 cm high-grade adenocarcinoma with pleural  invasion. Nodes were negative. Pathologic stage was T2aN1M0 (stage IB).  Chest CT on 02/10/2017 revealed clear interval progression of the posterior left lower lobe pulmonary nodule, now measuring 12 mm in long axis and having a distinctly lobular contour. Imaging features were very concerning for neoplasm.  There was no change in the 7 mm nodule posterior to the right hilum.  PET scan on 02/18/2017 revealed a 11 x 8 mm posterior left lower lobe pulmonary nodule that had mildly progressed from prior studies and demonstrated mild hypermetabolism (SUV 1.6).  The appearance was considered worrisome for an indolent primary bronchogenic neoplasm.  03/10/2017.  s/p wedge resection of a left lower lobe mass.   Pathology revealed an 8 mm invasive adenocarcinoma with solid and acinar patterns.  There was no visceral invasion.  There was no lymphovascular invasion.  All margins were negative.  Pathologic stage was T1aNx (stage IA).   INTERVAL HISTORY Breia C [name] is a 88 y.o. female who has above history reviewed by me today presents for follow up visit for management of history of lung cancer, 6 months assessment.  Patient reports feeling well.  She was accompanied by her husband. Denies shortness of breath, fatigue, hemoptysis, unintentional weight loss. Recent hospitalization for diverticulitis, and UTI. Chemotherapy was held last week to allow her finish abx course.  No new complaints. Denies nausea vomiting, diarrhea, dysuria, flank pain or fever.    Past Medical History:  Diagnosis Date   Anginal pain (HCC)    Benign essential hypertension    Brain aneurysm    Carotid artery stenosis    Carotid stenosis  06/19/2013   Colonic polyp    Emphysema lung (HCC)    GERD (gastroesophageal reflux disease)    Hiatal hernia    Hypercholesteremia    Hyperlipidemia, mixed 06/09/2015   Lung cancer (HCC) 2016   Lung nodule 06/21/2014   Macrocytic 10/06/2014   Multiple thyroid  nodules    PAT (paroxysmal  atrial tachycardia) (HCC) 06/19/2013   Plantar fasciitis     Past Surgical History:  Procedure Laterality Date   BREAST EXCISIONAL BIOPSY Right 40 yrs ago   neg   BREAST LUMPECTOMY     benign   CATARACT EXTRACTION W/ INTRAOCULAR LENS  IMPLANT, BILATERAL Bilateral    CEREBRAL ANEURYSM REPAIR  2004   coil treatment   CERVIX LESION DESTRUCTION     COLONOSCOPY     1995, 2006, 2014   COLONOSCOPY WITH PROPOFOL  N/A 03/31/2015   Procedure: COLONOSCOPY WITH PROPOFOL ;  Surgeon: Cassie Click, MD;  Location: [facility];  Service: Endoscopy;  Laterality: N/A;   CYSTOSCOPY W/ RETROGRADES Left 03/02/2022   Procedure: CYSTOSCOPY WITH RETROGRADE PYELOGRAM;  Surgeon: Geraline Knapp, MD;  Location: [facility];  Service: Urology;  Laterality: Left;   CYSTOSCOPY W/ RETROGRADES Left 12/28/2022   Procedure: CYSTOSCOPY WITH RETROGRADE PYELOGRAM;  Surgeon: Geraline Knapp, MD;  Location: [facility];  Service: Urology;  Laterality: Left;   CYSTOSCOPY W/ URETERAL STENT PLACEMENT Left 03/03/2021   Procedure: CYSTOSCOPY WITH EXCHANGE;  Surgeon: Geraline Knapp, MD;  Location: [facility];  Service: Urology;  Laterality: Left;   CYSTOSCOPY W/ URETERAL STENT PLACEMENT Left 03/02/2022   Procedure: CYSTOSCOPY WITH STENT EXCHANGE;  Surgeon: Geraline Knapp, MD;  Location: [facility];  Service: Urology;  Laterality: Left;   CYSTOSCOPY W/ URETERAL STENT PLACEMENT Left 12/28/2022   Procedure: CYSTOSCOPY WITH STENT EXCHANGE;  Surgeon: Geraline Knapp, MD;  Location: [facility];  Service: Urology;  Laterality: Left;   CYSTOSCOPY WITH STENT PLACEMENT Left 11/25/2020   Procedure: CYSTOSCOPY WITH STENT PLACEMENT;  Surgeon: Geraline Knapp, MD;  Location: [facility];  Service: Urology;  Laterality: Left;   ENDOBRONCHIAL ULTRASOUND Bilateral 04/19/2023   Procedure: ENDOBRONCHIAL ULTRASOUND (EBUS);  Surgeon: Vergia Glasgow, MD;  Location: [facility];  Service: Pulmonary;  Laterality: Bilateral;   FLEXIBLE BRONCHOSCOPY N/A  03/10/2017   Procedure: FLEXIBLE BRONCHOSCOPY;  Surgeon: Petra Brandy, MD;  Location: [facility];  Service: Thoracic;  Laterality: N/A;   THORACOSCOPY WITH WEDGE RESECTION LUNG Right 03/13/2014   RUL   THORACOTOMY/LOBECTOMY Left 03/10/2017   Procedure: THORACOTOMY WITH LUNG WEDGE RESECTION POSSIBLE LOBECTOMY;  Surgeon: Petra Brandy, MD;  Location: [facility];  Service: Thoracic;  Laterality: Left;   TUBAL LIGATION      Family History  Problem Relation Age of Onset   Alcohol abuse Mother    Heart attack Father    Breast cancer Neg Hx     Social History:  reports that she quit smoking about 35 years ago. Her smoking use included cigarettes. She started smoking about 70 years ago. She has a 35 pack-year smoking history. She has never used smokeless tobacco. She reports current alcohol use. She reports that she does not use drugs.   Allergies:  Allergies  Allergen Reactions   Ace Inhibitors Swelling   Contrast Media [Iodinated Contrast Media]    Cortisone     Patient had facial swelling and itching from cortisone injection IM    Current Medications: Current Outpatient Medications  Medication Sig Dispense Refill   amLODipine  (NORVASC ) 5 MG tablet Take 5 mg  by mouth at bedtime.     atenolol  (TENORMIN ) 50 MG tablet Take 50 mg by mouth at bedtime.     calcium -vitamin D  (OSCAL WITH D) 500-200 MG-UNIT tablet Take 1 tablet by mouth daily with breakfast.     cetirizine (ZYRTEC) 10 MG tablet Take 10 mg by mouth daily.     dextromethorphan-guaiFENesin  (MUCINEX  DM) 30-600 MG 12hr tablet Take 1 tablet by mouth 2 (two) times daily as needed for cough.     donepezil  (ARICEPT ) 10 MG tablet Take 1 tablet by mouth at bedtime.     fluticasone  (FLONASE ) 50 MCG/ACT nasal spray Place 1 spray into the nose daily as needed for allergies.      ondansetron  (ZOFRAN ) 8 MG tablet Take 1 tablet (8 mg total) by mouth every 8 (eight) hours as needed for nausea or vomiting. Start on the third day after chemotherapy.  30 tablet 1   simvastatin  (ZOCOR ) 20 MG tablet Take 20 mg by mouth every morning.     vitamin B-12 (CYANOCOBALAMIN ) 100 MCG tablet Take 100 mcg by mouth daily.     No current facility-administered medications for this visit.   Facility-Administered Medications Ordered in Other Visits  Medication Dose Route Frequency Provider Last Rate Last Admin   0.9 %  sodium chloride  infusion   Intravenous Continuous Timmy Forbes, MD   Stopped at 06/06/23 1646   Review of Systems  Constitutional:  Negative for appetite change, chills, fatigue and fever.  HENT:   Positive for hearing loss. Negative for voice change.   Eyes:  Negative for eye problems.  Respiratory:  Negative for chest tightness and cough.   Cardiovascular:  Negative for chest pain.  Gastrointestinal:  Positive for nausea. Negative for abdominal distention, abdominal pain and blood in stool.  Endocrine: Negative for hot flashes.  Genitourinary:  Negative for difficulty urinating and frequency.   Musculoskeletal:  Negative for arthralgias.       Chronic intermittent back pain  Skin:  Negative for itching and rash.  Neurological:  Negative for extremity weakness.  Hematological:  Negative for adenopathy.  Psychiatric/Behavioral:  Negative for confusion.     Performance status (ECOG): 0 - Asymptomatic  Vital Signs BP (!) 142/63   Pulse 70   Temp 97.7 F (36.5 C)   Resp 19   Wt 128 lb 6.4 oz (58.2 kg)   SpO2 99%   BMI 23.48 kg/m   Physical Exam Constitutional:      General: She is not in acute distress.    Appearance: She is not diaphoretic.  HENT:     Head: Normocephalic and atraumatic.  Eyes:     General: No scleral icterus. Cardiovascular:     Rate and Rhythm: Normal rate and regular rhythm.  Pulmonary:     Effort: Pulmonary effort is normal. No respiratory distress.  Abdominal:     General: There is no distension.     Palpations: Abdomen is soft.     Tenderness: There is no abdominal tenderness.  Musculoskeletal:         General: Normal range of motion.     Cervical back: Normal range of motion and neck supple.  Skin:    General: Skin is dry.     Findings: No erythema.  Neurological:     Mental Status: She is alert and oriented to person, place, and time. Mental status is at baseline.  Psychiatric:        Mood and Affect: Mood and affect normal.  RADIOGRAPHIC STUDIES: I have personally reviewed the radiological images as listed and agreed with the findings in the report. CT ABDOMEN PELVIS WO CONTRAST Result Date: 05/28/2023 CLINICAL DATA:  Lower abdominal pain and diarrhea. Near syncopal episodes. On chemo and radiation for lung carcinoma. EXAM: CT ABDOMEN AND PELVIS WITHOUT CONTRAST TECHNIQUE: Multidetector CT imaging of the abdomen and pelvis was performed following the standard protocol without IV contrast. RADIATION DOSE REDUCTION: This exam was performed according to the departmental dose-optimization program which includes automated exposure control, adjustment of the mA and/or kV according to patient size and/or use of iterative reconstruction technique. COMPARISON:  CT abdomen pelvis 11/25/2020 and PET/CT 03/30/2023 FINDINGS: Lower chest: Postsurgical changes left lower lobe. No acute abnormality. Hepatobiliary: Unchanged hepatic cysts in the left hepatic lobe. Hypoattenuating in the posterior right hepatic lobe on series 2/image 24 measures 6 mm and is too small to definitively characterize but appear similar to 11/25/2020. Unremarkable gallbladder and biliary tree. Pancreas: Unremarkable. Spleen: Unremarkable. Adrenals/Urinary Tract: Normal adrenal glands. Marked left hydronephrosis has increased compared to 03/30/2023. The left ureteral stent is unchanged in position compared to recent PET/CT with the proximal loop in the distal portion of the dilated left renal pelvis. Stable right kidney. Unremarkable bladder. Stomach/Bowel: Small hiatal hernia. No bowel obstruction. Colonic diverticulosis. Wall  thickening about the sigmoid colon with mild adjacent stranding. Normal appendix. Vascular/Lymphatic: Aortic atherosclerosis. No enlarged abdominal or pelvic lymph nodes. Reproductive: No acute abnormality. Other: Stranding about the sigmoid colon. No organized fluid collection or abscess. No free intraperitoneal air. Musculoskeletal: No acute fracture or destructive osseous lesion. IMPRESSION: 1. Acute uncomplicated sigmoid diverticulitis. Consider follow-up colonoscopy following resolution of patient's acute symptoms to exclude underlying mass. 2. Marked left hydronephrosis has increased compared to 03/30/2023. The left ureteral stent is unchanged in position compared to recent PET/CT with the proximal loop in the distal portion of the dilated left renal pelvis. Query stent dysfunction. 3. Aortic Atherosclerosis (ICD10-I70.0). Electronically Signed   By: Rozell Cornet M.D.   On: 05/28/2023 03:00   MR Brain W Wo Contrast Result Date: 04/27/2023 CLINICAL DATA:  Lung cancer.  Staging. EXAM: MRI HEAD WITHOUT AND WITH CONTRAST TECHNIQUE: Multiplanar, multiecho pulse sequences of the brain and surrounding structures were obtained without and with intravenous contrast. CONTRAST:  5mL GADAVIST  GADOBUTROL  1 MMOL/ML IV SOLN COMPARISON:  07/15/2015 FINDINGS: Brain: Diffusion imaging does not show any acute or subacute infarction. Brainstem and cerebellum are normal. Cerebral hemispheres show minimal small vessel change of the white matter, less than often seen at this age. No cortical or large vessel territory infarction. No mass lesion, hemorrhage, hydrocephalus or extra-axial collection. Vascular: Major vessels at the base of the brain show flow. Persistent trigeminal artery on the left as seen on previous imaging. Redemonstration of a small aneurysm of the left supraclinoid ICA, not studied in detail. Skull and upper cervical spine: Negative Sinuses/Orbits: Clear/normal Other: None IMPRESSION: 1. No evidence of  metastatic disease. Minimal small vessel change of the cerebral hemispheric white matter, less than often seen at this age. 2. Persistent trigeminal artery on the left as seen on previous imaging. Redemonstration of a small aneurysm of the left supraclinoid ICA, not studied in detail. Electronically Signed   By: Bettylou Brunner M.D.   On: 04/27/2023 11:17   DG Chest Port 1 View Result Date: 04/19/2023 CLINICAL DATA:  Status post bronchoscopy. EXAM: PORTABLE CHEST 1 VIEW COMPARISON:  Chest radiograph dated 12/15/2022 and CT dated 03/18/2023. FINDINGS: No focal consolidation, pleural effusion, pneumothorax.  Surgical suture in the left lower lung field. The cardiac silhouette is within normal limits. Atherosclerotic calcification of the aortic arch. No acute osseous pathology. IMPRESSION: No active disease. No pneumothorax. Electronically Signed   By: Angus Bark M.D.   On: 04/19/2023 12:38   NM PET Image Initial (PI) Skull Base To Thigh Result Date: 04/05/2023 CLINICAL DATA:  Subsequent treatment strategy for lung carcinoma. Abnormal CT with enlarged pretracheal lymph CT. EXAM: NUCLEAR MEDICINE PET SKULL BASE TO THIGH TECHNIQUE: mCi F-18 FDG was injected intravenously. Full-ring PET imaging was performed from the skull base to thigh after the radiotracer. CT data was obtained and used for attenuation correction and anatomic localization. Fasting blood glucose:  mg/dl COMPARISON:  None Available. FINDINGS: NECK: No hypermetabolic lymph nodes in the neck.  Is Incidental CT findings: None. CHEST: Postsurgical change in the RIGHT upper lobe. No hypermetabolic pulmonary nodules. The enlarged pretracheal nodule of concern measures 11 mm short axis (image 43/6) and has intense metabolic activity with SUV max equal 10.1. No additional hypermetabolic mediastinal hilar nodes. No supraclavicular adenopathy. Incidental CT findings: Small nodule along the LEFT oblique fissure measuring 3 mm (image 56/6) has metabolic  activity (SUV max equal 2.8) . This nodule was present on CT 03/16/2022 and similar size. Nodule also present on CT from 03/16/2021 and same size. ABDOMEN/PELVIS: No abnormal hypermetabolic activity within the liver, pancreas, adrenal glands, or spleen. No hypermetabolic lymph nodes in the abdomen or pelvis. Incidental CT findings: LEFT hydronephrosis. Double-J ureteral stent on the LEFT. The proximal stent is in the dilated LEFT renal pelvis. There is evidence of urine excretion along the course of the LEFT ureter. SKELETON: No focal hypermetabolic activity to suggest skeletal metastasis. Incidental CT findings: None. IMPRESSION: 1. Hypermetabolic pretracheal lymph node is concerning for metastatic adenopathy. 2. No evidence of local recurrence in the RIGHT upper lobe. 3. No evidence of metastatic disease in the abdomen pelvis. 4. Small nodule along the LEFT oblique fissure has metabolic activity; however, nodule has been stable in size since 03/16/2021. Favor benign nodule. 5. LEFT hydronephrosis with double-J ureteral stent in place. Electronically Signed   By: Deboraha Fallow M.D.   On: 04/05/2023 11:35   CT Chest Wo Contrast Result Date: 03/22/2023 CLINICAL DATA:  History of stage I B right upper lobe adenocarcinoma. Status post right wedge resection. * Tracking Code: BO * EXAM: CT CHEST WITHOUT CONTRAST TECHNIQUE: Multidetector CT imaging of the chest was performed following the standard protocol without IV contrast. RADIATION DOSE REDUCTION: This exam was performed according to the departmental dose-optimization program which includes automated exposure control, adjustment of the mA and/or kV according to patient size and/or use of iterative reconstruction technique. COMPARISON:  X-ray 12/15/2022.  CT scan 03/16/2022 and older. FINDINGS: Cardiovascular: On this non IV contrast exam the thoracic aorta has a normal course and caliber with moderate calcified atherosclerotic plaque. Calcified plaque also  extends along the great vessels as well as along the coronary arteries. Heart is nonenlarged. No significant pericardial effusion. There is some enlargement of the main pulmonary artery. Please correlate for evidence of pulmonary hypertension. Mediastinum/Nodes: Small thyroid  gland. Slightly patulous thoracic esophagus with small hiatal hernia. Unchanged from previous. On this non IV contrast exam there is no specific abnormal lymph node enlargement identified in the axillary regions or hila. Enlarged pretracheal lymph node identified on series 2, image 49 today measuring 19 by 13 mm. Previously 16 x 10 mm. Smaller on older examinations as well. Slightly larger than previous. There  are no other clear abnormal nodal areas of enlargement in the mediastinum. Lungs/Pleura: Stable surgical changes along left lower lobe with wedge resection. Areas of scarring and fibrotic changes. Other areas of scarring atelectatic changes are also seen. Apical pleural thickening. Surgical is also seen along the anteromedial right upper lobe. No consolidation or pneumothorax. Diffuse centrilobular emphysematous changes. There is a focal irregular apical nodule on the left previously measuring 6 x 7 mm. Today this area is slightly less nodular but there continues to be some distortion. Area is measured at 6 x 5 mm on series 4, image 26 today. The ground-glass nodular area in the posteromedial right lower lobe which previously measured 13 x 11 mm, today is similar in size and appearance on series 4, image 77. 3 mm middle lobe nodule stable today on series 4, image 89. There is also a nodule in the right upper lobe posterior to the lung hilum measuring 8 by 5 mm on series 4, image 50. This is unchanged in retrospect. This has been present and stable since at least August 2022 demonstrating long-term stability. No new dominant lung nodule identified. Upper Abdomen: Adrenal glands are preserved in the upper abdomen. Once again there is an  extrarenal pelvis left kidney, unchanged from previous with some ectasia. Benign-appearing hepatic cystic foci. Musculoskeletal: Scattered degenerative changes along the spine. IMPRESSION: Mildly enlarged pre tracheal lymph node is slightly increased in size today from previous. Recommend additional PET-CT evaluation as clinically appropriate. Alternatively, a follow-up in 3 months could be considered with the slow interval growth. Stable bilateral areas of lung nodularity and postsurgical changes. Aortic Atherosclerosis (ICD10-I70.0) and Emphysema (ICD10-J43.9). Electronically Signed   By: Adrianna Horde M.D.   On: 03/22/2023 20:40    Laboratory results were reviewed by me    Latest Ref Rng & Units 06/06/2023   12:54 PM 05/30/2023    9:49 AM 05/29/2023    4:05 AM  CBC  WBC 4.0 - 10.5 K/uL 4.6  3.3  3.7   Hemoglobin 12.0 - 15.0 g/dL 45.4  09.8  11.9   Hematocrit 36.0 - 46.0 % 43.9  35.5  34.9   Platelets 150 - 400 K/uL 248  151  144       Latest Ref Rng & Units 06/06/2023   12:54 PM 05/30/2023    9:49 AM 05/28/2023    2:20 AM  CMP  Glucose 70 - 99 mg/dL 147  829  562   BUN 8 - 23 mg/dL 23  13  26    Creatinine 0.44 - 1.00 mg/dL 1.30  8.65  7.84   Sodium 135 - 145 mmol/L 139  139  131   Potassium 3.5 - 5.1 mmol/L 5.0  3.9  4.1   Chloride 98 - 111 mmol/L 106  106  101   CO2 22 - 32 mmol/L 26  24  22    Calcium  8.9 - 10.3 mg/dL 9.3  8.4  8.7   Total Protein 6.5 - 8.1 g/dL 6.6  5.4  5.5   Total Bilirubin 0.0 - 1.2 mg/dL 0.4  0.4  0.8   Alkaline Phos 38 - 126 U/L 48  38  40   AST 15 - 41 U/L 24  17  18    ALT 0 - 44 U/L 36  16  17

## 2023-06-06 NOTE — Assessment & Plan Note (Signed)
Same plan as above.  

## 2023-06-06 NOTE — Assessment & Plan Note (Signed)
#  History of stage Ib right lung upper lobe adenocarcinoma status post wedge resection in 2016 History of left lower lobe adenocarcinoma T1 a NX status post wedge resection 2019. Recurrence - Biopsy of pretracheal lymph node positive for NSCLC.  MRI brain is negative.  Recommend concurrent chemotherapy [carboplatin  taxol ]  with radiation.  Labs are reviewed and discussed with patient. Proceed with carboplatin  AUC2, taxol  45mg /m2.

## 2023-06-07 ENCOUNTER — Ambulatory Visit
Admission: RE | Admit: 2023-06-07 | Discharge: 2023-06-07 | Disposition: A | Discharge status: Home / Self Care | Source: Ambulatory Visit | Attending: Radiation Oncology | Admitting: Radiation Oncology

## 2023-06-07 ENCOUNTER — Other Ambulatory Visit: Payer: Self-pay

## 2023-06-07 DIAGNOSIS — C3411 Malignant neoplasm of upper lobe, right bronchus or lung: Secondary | ICD-10-CM | POA: Diagnosis not present

## 2023-06-07 LAB — RAD ONC ARIA SESSION SUMMARY
Course Elapsed Days: 22
Plan Fractions Treated to Date: 17
Plan Prescribed Dose Per Fraction: 2 Gy
Plan Total Fractions Prescribed: 33
Plan Total Prescribed Dose: 66 Gy
Reference Point Dosage Given to Date: 34 Gy
Reference Point Session Dosage Given: 2 Gy
Session Number: 17

## 2023-06-08 ENCOUNTER — Ambulatory Visit
Admission: RE | Admit: 2023-06-08 | Discharge: 2023-06-08 | Disposition: A | Discharge status: Home / Self Care | Source: Ambulatory Visit | Attending: Radiation Oncology | Admitting: Radiation Oncology

## 2023-06-08 ENCOUNTER — Other Ambulatory Visit: Payer: Self-pay

## 2023-06-08 DIAGNOSIS — C3411 Malignant neoplasm of upper lobe, right bronchus or lung: Secondary | ICD-10-CM | POA: Diagnosis not present

## 2023-06-08 LAB — RAD ONC ARIA SESSION SUMMARY
Course Elapsed Days: 23
Plan Fractions Treated to Date: 18
Plan Prescribed Dose Per Fraction: 2 Gy
Plan Total Fractions Prescribed: 33
Plan Total Prescribed Dose: 66 Gy
Reference Point Dosage Given to Date: 36 Gy
Reference Point Session Dosage Given: 2 Gy
Session Number: 18

## 2023-06-09 ENCOUNTER — Ambulatory Visit
Admission: RE | Admit: 2023-06-09 | Discharge: 2023-06-09 | Disposition: A | Discharge status: Home / Self Care | Source: Ambulatory Visit | Attending: Radiation Oncology | Admitting: Radiation Oncology

## 2023-06-09 ENCOUNTER — Other Ambulatory Visit: Payer: Self-pay

## 2023-06-09 DIAGNOSIS — C3411 Malignant neoplasm of upper lobe, right bronchus or lung: Secondary | ICD-10-CM | POA: Diagnosis not present

## 2023-06-09 LAB — RAD ONC ARIA SESSION SUMMARY
Course Elapsed Days: 24
Plan Fractions Treated to Date: 19
Plan Prescribed Dose Per Fraction: 2 Gy
Plan Total Fractions Prescribed: 33
Plan Total Prescribed Dose: 66 Gy
Reference Point Dosage Given to Date: 38 Gy
Reference Point Session Dosage Given: 2 Gy
Session Number: 19

## 2023-06-10 ENCOUNTER — Other Ambulatory Visit: Payer: Self-pay

## 2023-06-10 ENCOUNTER — Ambulatory Visit
Admission: RE | Admit: 2023-06-10 | Discharge: 2023-06-10 | Disposition: A | Discharge status: Home / Self Care | Source: Ambulatory Visit | Attending: Radiation Oncology | Admitting: Radiation Oncology

## 2023-06-10 DIAGNOSIS — C3411 Malignant neoplasm of upper lobe, right bronchus or lung: Secondary | ICD-10-CM | POA: Diagnosis not present

## 2023-06-10 LAB — RAD ONC ARIA SESSION SUMMARY
Course Elapsed Days: 25
Plan Fractions Treated to Date: 20
Plan Prescribed Dose Per Fraction: 2 Gy
Plan Total Fractions Prescribed: 33
Plan Total Prescribed Dose: 66 Gy
Reference Point Dosage Given to Date: 40 Gy
Reference Point Session Dosage Given: 2 Gy
Session Number: 20

## 2023-06-13 ENCOUNTER — Encounter: Payer: Self-pay | Admitting: Oncology

## 2023-06-13 ENCOUNTER — Inpatient Hospital Stay

## 2023-06-13 ENCOUNTER — Other Ambulatory Visit: Payer: Self-pay

## 2023-06-13 ENCOUNTER — Ambulatory Visit
Admission: RE | Admit: 2023-06-13 | Discharge: 2023-06-13 | Disposition: A | Discharge status: Home / Self Care | Source: Ambulatory Visit | Attending: Radiation Oncology | Admitting: Radiation Oncology

## 2023-06-13 ENCOUNTER — Inpatient Hospital Stay: Admitting: Oncology

## 2023-06-13 VITALS — BP 167/73 | HR 82

## 2023-06-13 VITALS — BP 142/78 | HR 84 | Temp 96.6°F | Resp 18 | Wt 126.1 lb

## 2023-06-13 DIAGNOSIS — C3411 Malignant neoplasm of upper lobe, right bronchus or lung: Secondary | ICD-10-CM

## 2023-06-13 DIAGNOSIS — R110 Nausea: Secondary | ICD-10-CM | POA: Diagnosis not present

## 2023-06-13 DIAGNOSIS — C3432 Malignant neoplasm of lower lobe, left bronchus or lung: Secondary | ICD-10-CM

## 2023-06-13 DIAGNOSIS — T451X5A Adverse effect of antineoplastic and immunosuppressive drugs, initial encounter: Secondary | ICD-10-CM

## 2023-06-13 DIAGNOSIS — Z5111 Encounter for antineoplastic chemotherapy: Secondary | ICD-10-CM

## 2023-06-13 LAB — CBC WITH DIFFERENTIAL (CANCER CENTER ONLY)
Abs Immature Granulocytes: 0.03 10*3/uL (ref 0.00–0.07)
Basophils Absolute: 0 10*3/uL (ref 0.0–0.1)
Basophils Relative: 0 %
Eosinophils Absolute: 0 10*3/uL (ref 0.0–0.5)
Eosinophils Relative: 1 %
HCT: 41.9 % (ref 36.0–46.0)
Hemoglobin: 13.9 g/dL (ref 12.0–15.0)
Immature Granulocytes: 1 %
Lymphocytes Relative: 23 %
Lymphs Abs: 1.2 10*3/uL (ref 0.7–4.0)
MCH: 33 pg (ref 26.0–34.0)
MCHC: 33.2 g/dL (ref 30.0–36.0)
MCV: 99.5 fL (ref 80.0–100.0)
Monocytes Absolute: 0.3 10*3/uL (ref 0.1–1.0)
Monocytes Relative: 6 %
Neutro Abs: 3.7 10*3/uL (ref 1.7–7.7)
Neutrophils Relative %: 69 %
Platelet Count: 151 10*3/uL (ref 150–400)
RBC: 4.21 MIL/uL (ref 3.87–5.11)
RDW: 13.5 % (ref 11.5–15.5)
WBC Count: 5.2 10*3/uL (ref 4.0–10.5)
nRBC: 0 % (ref 0.0–0.2)

## 2023-06-13 LAB — CMP (CANCER CENTER ONLY)
ALT: 24 U/L (ref 0–44)
AST: 23 U/L (ref 15–41)
Albumin: 3.8 g/dL (ref 3.5–5.0)
Alkaline Phosphatase: 50 U/L (ref 38–126)
Anion gap: 10 (ref 5–15)
BUN: 21 mg/dL (ref 8–23)
CO2: 25 mmol/L (ref 22–32)
Calcium: 8.9 mg/dL (ref 8.9–10.3)
Chloride: 105 mmol/L (ref 98–111)
Creatinine: 0.72 mg/dL (ref 0.44–1.00)
GFR, Estimated: >60 mL/min (ref >60)
Glucose, Bld: 90 mg/dL (ref 70–99)
Potassium: 4.1 mmol/L (ref 3.5–5.1)
Sodium: 140 mmol/L (ref 135–145)
Total Bilirubin: 0.6 mg/dL (ref 0.0–1.2)
Total Protein: 6.5 g/dL (ref 6.5–8.1)

## 2023-06-13 LAB — RAD ONC ARIA SESSION SUMMARY
Course Elapsed Days: 28
Plan Fractions Treated to Date: 21
Plan Prescribed Dose Per Fraction: 2 Gy
Plan Total Fractions Prescribed: 33
Plan Total Prescribed Dose: 66 Gy
Reference Point Dosage Given to Date: 42 Gy
Reference Point Session Dosage Given: 2 Gy
Session Number: 21

## 2023-06-13 MED ORDER — SODIUM CHLORIDE 0.9 % IV SOLN
50.0000 mg/m2 | Freq: Once | INTRAVENOUS | Status: AC
  Administered 2023-06-13: 78 mg via INTRAVENOUS
  Filled 2023-06-13: qty 13

## 2023-06-13 MED ORDER — PALONOSETRON HCL INJECTION 0.25 MG/5ML
0.2500 mg | Freq: Once | INTRAVENOUS | Status: AC
  Administered 2023-06-13: 0.25 mg via INTRAVENOUS
  Filled 2023-06-13: qty 5

## 2023-06-13 MED ORDER — DEXAMETHASONE SODIUM PHOSPHATE 10 MG/ML IJ SOLN
10.0000 mg | Freq: Once | INTRAMUSCULAR | Status: AC
  Administered 2023-06-13: 10 mg via INTRAVENOUS
  Filled 2023-06-13: qty 1

## 2023-06-13 MED ORDER — FAMOTIDINE IN NACL 20-0.9 MG/50ML-% IV SOLN
20.0000 mg | Freq: Once | INTRAVENOUS | Status: AC
  Administered 2023-06-13: 20 mg via INTRAVENOUS
  Filled 2023-06-13: qty 50

## 2023-06-13 MED ORDER — DIPHENHYDRAMINE HCL 50 MG/ML IJ SOLN
25.0000 mg | Freq: Once | INTRAMUSCULAR | Status: AC
  Administered 2023-06-13: 25 mg via INTRAVENOUS
  Filled 2023-06-13: qty 1

## 2023-06-13 MED ORDER — SODIUM CHLORIDE 0.9 % IV SOLN
125.6000 mg | Freq: Once | INTRAVENOUS | Status: AC
  Administered 2023-06-13: 130 mg via INTRAVENOUS
  Filled 2023-06-13: qty 13

## 2023-06-13 MED ORDER — SODIUM CHLORIDE 0.9 % IV SOLN
INTRAVENOUS | Status: DC
Start: 2023-06-13 — End: 2023-06-13
  Filled 2023-06-13: qty 250

## 2023-06-13 NOTE — Assessment & Plan Note (Signed)
Same plan as above.  

## 2023-06-13 NOTE — Assessment & Plan Note (Signed)
 Continue antiemetics as needed per instruction.

## 2023-06-13 NOTE — Assessment & Plan Note (Signed)
 Chemotherapy plan as listed above

## 2023-06-13 NOTE — Progress Notes (Signed)
 Hematology/Oncology Progress note [phone] [phone]     Chief Complaint:  Follow up for recurrent non small cell lung cancer   ASSESSMENT & PLAN:  Heather [name] is a 88 y.o. Heather [name] with stage IB adenocarcinoma of the right upper lobe status post wedge resection on 03/13/2014. T2aN1M0 (stage IB).  She is s/p wedge resection of a left lower lobe mass on 03/10/2017. T1aNx (stage IA). No with locally recurrent NSCLC   Malignant neoplasm of lower lobe of left lung (HCC) #History of stage Ib right lung upper lobe adenocarcinoma status post wedge resection in 2016 History of left lower lobe adenocarcinoma T1 a NX status post wedge resection 2019. Recurrence - Biopsy of pretracheal lymph node positive for NSCLC.  MRI brain is negative.  Recommend concurrent chemotherapy [carboplatin  taxol ]  with radiation.  Labs are reviewed and discussed with patient. Proceed with carboplatin  AUC2, taxol  45mg /m2.      Chemotherapy-induced nausea Continue antiemetics as needed per instruction.  Encounter for antineoplastic chemotherapy Chemotherapy plan as listed above.   Primary cancer of right upper lobe of lung [facility]) Same plan as above.    No orders of the defined types were placed in this encounter.  Follow up 1 week.   All questions were answered. The patient knows to call the clinic with any problems, questions or concerns.  We spent sufficient time to discuss many aspect of care, questions were answered to patient's satisfaction.   Timmy Forbes, MD, PhD [facility] Hematology Oncology 06/13/2023   PERTINENT ONCOLOGY HISTORY Previously follows up with Dr.Corcoran. Estasblish care with me on 03/15/2018  PET scan on 02/13/2014 revealed a 1 cm right upper lobe hypermetabolic nodule (SUV 3.8) and a 6 mm right upper lobe nodule (no metabolic activity). There was no mediastianl adenopathy.  stage IB adenocarcinoma of the right upper lobe status post wedge resection on  03/13/2014. Pathology revealed a 1.1 cm high-grade adenocarcinoma with pleural invasion. Nodes were negative. Pathologic stage was T2aN1M0 (stage IB).  Chest CT on 02/10/2017 revealed clear interval progression of the posterior left lower lobe pulmonary nodule, now measuring 12 mm in long axis and having a distinctly lobular contour. Imaging features were very concerning for neoplasm.  There was no change in the 7 mm nodule posterior to the right hilum.  PET scan on 02/18/2017 revealed a 11 x 8 mm posterior left lower lobe pulmonary nodule that had mildly progressed from prior studies and demonstrated mild hypermetabolism (SUV 1.6).  The appearance was considered worrisome for an indolent primary bronchogenic neoplasm.  03/10/2017.  s/p wedge resection of a left lower lobe mass.   Pathology revealed an 8 mm invasive adenocarcinoma with solid and acinar patterns.  There was no visceral invasion.  There was no lymphovascular invasion.  All margins were negative.  Pathologic stage was T1aNx (stage IA).   INTERVAL HISTORY Heather [name] is a 88 y.o. [name] who has above history reviewed by me today presents for follow up visit for management of history of lung cancer, 6 months assessment.  Patient reports feeling well.  She was accompanied by her son Denies shortness of breath, fatigue, hemoptysis, unintentional weight loss. \No new complaints. Denies nausea vomiting, diarrhea, dysuria, flank pain or fever.    Past Medical History:  Diagnosis Date   Anginal pain (HCC)    Benign essential hypertension    Brain aneurysm    Carotid artery stenosis    Carotid stenosis 06/19/2013   Colonic polyp    Emphysema  lung (HCC)    GERD (gastroesophageal reflux disease)    Hiatal hernia    Hypercholesteremia    Hyperlipidemia, mixed 06/09/2015   Lung cancer (HCC) 2016   Lung nodule 06/21/2014   Macrocytic 10/06/2014   Multiple thyroid  nodules    PAT (paroxysmal atrial tachycardia) (HCC) 06/19/2013    Plantar fasciitis     Past Surgical History:  Procedure Laterality Date   BREAST EXCISIONAL BIOPSY Right 40 yrs ago   neg   BREAST LUMPECTOMY     benign   CATARACT EXTRACTION W/ INTRAOCULAR LENS  IMPLANT, BILATERAL Bilateral    CEREBRAL ANEURYSM REPAIR  2004   coil treatment   CERVIX LESION DESTRUCTION     COLONOSCOPY     1995, 2006, 2014   COLONOSCOPY WITH PROPOFOL  N/A 03/31/2015   Procedure: COLONOSCOPY WITH PROPOFOL ;  Surgeon: Cassie Click, MD;  Location: [facility] ENDOSCOPY;  Service: Endoscopy;  Laterality: N/A;   CYSTOSCOPY W/ RETROGRADES Left 03/02/2022   Procedure: CYSTOSCOPY WITH RETROGRADE PYELOGRAM;  Surgeon: Geraline Knapp, MD;  Location: [facility] ORS;  Service: Urology;  Laterality: Left;   CYSTOSCOPY W/ RETROGRADES Left 12/28/2022   Procedure: CYSTOSCOPY WITH RETROGRADE PYELOGRAM;  Surgeon: Geraline Knapp, MD;  Location: [facility] ORS;  Service: Urology;  Laterality: Left;   CYSTOSCOPY W/ URETERAL STENT PLACEMENT Left 03/03/2021   Procedure: CYSTOSCOPY WITH EXCHANGE;  Surgeon: Geraline Knapp, MD;  Location: [facility] ORS;  Service: Urology;  Laterality: Left;   CYSTOSCOPY W/ URETERAL STENT PLACEMENT Left 03/02/2022   Procedure: CYSTOSCOPY WITH STENT EXCHANGE;  Surgeon: Geraline Knapp, MD;  Location: [facility] ORS;  Service: Urology;  Laterality: Left;   CYSTOSCOPY W/ URETERAL STENT PLACEMENT Left 12/28/2022   Procedure: CYSTOSCOPY WITH STENT EXCHANGE;  Surgeon: Geraline Knapp, MD;  Location: [facility] ORS;  Service: Urology;  Laterality: Left;   CYSTOSCOPY WITH STENT PLACEMENT Left 11/25/2020   Procedure: CYSTOSCOPY WITH STENT PLACEMENT;  Surgeon: Geraline Knapp, MD;  Location: [facility] ORS;  Service: Urology;  Laterality: Left;   ENDOBRONCHIAL ULTRASOUND Bilateral 04/19/2023   Procedure: ENDOBRONCHIAL ULTRASOUND (EBUS);  Surgeon: Vergia Glasgow, MD;  Location: [facility] ORS;  Service: Pulmonary;  Laterality: Bilateral;   FLEXIBLE BRONCHOSCOPY N/A 03/10/2017   Procedure: FLEXIBLE BRONCHOSCOPY;   Surgeon: Petra Brandy, MD;  Location: [facility] ORS;  Service: Thoracic;  Laterality: N/A;   THORACOSCOPY WITH WEDGE RESECTION LUNG Right 03/13/2014   RUL   THORACOTOMY/LOBECTOMY Left 03/10/2017   Procedure: THORACOTOMY WITH LUNG WEDGE RESECTION POSSIBLE LOBECTOMY;  Surgeon: Petra Brandy, MD;  Location: [facility] ORS;  Service: Thoracic;  Laterality: Left;   TUBAL LIGATION      Family History  Problem Relation Age of Onset   Alcohol abuse Mother    Heart attack Father    Breast cancer Neg Hx     Social History:  reports that she quit smoking about 35 years ago. Her smoking use included cigarettes. She started smoking about 70 years ago. She has a 35 pack-year smoking history. She has never used smokeless tobacco. She reports current alcohol use. She reports that she does not use drugs.   Allergies:  Allergies  Allergen Reactions   Ace Inhibitors Swelling   Contrast Media [Iodinated Contrast Media]    Cortisone     Patient had facial swelling and itching from cortisone injection IM    Current Medications: Current Outpatient Medications  Medication Sig Dispense Refill   amLODipine  (NORVASC ) 5 MG tablet Take 5 mg by mouth at bedtime.     atenolol  (  TENORMIN ) 50 MG tablet Take 50 mg by mouth at bedtime.     calcium -vitamin D  (OSCAL WITH D) 500-200 MG-UNIT tablet Take 1 tablet by mouth daily with breakfast.     cetirizine (ZYRTEC) 10 MG tablet Take 10 mg by mouth daily.     dextromethorphan-guaiFENesin  (MUCINEX  DM) 30-600 MG 12hr tablet Take 1 tablet by mouth 2 (two) times daily as needed for cough.     donepezil  (ARICEPT ) 10 MG tablet Take 1 tablet by mouth at bedtime.     fluticasone  (FLONASE ) 50 MCG/ACT nasal spray Place 1 spray into the nose daily as needed for allergies.      ondansetron  (ZOFRAN ) 8 MG tablet Take 1 tablet (8 mg total) by mouth every 8 (eight) hours as needed for nausea or vomiting. Start on the third day after chemotherapy. 30 tablet 1   simvastatin  (ZOCOR ) 20 MG tablet  Take 20 mg by mouth every morning.     vitamin B-12 (CYANOCOBALAMIN ) 100 MCG tablet Take 100 mcg by mouth daily.     No current facility-administered medications for this visit.   Facility-Administered Medications Ordered in Other Visits  Medication Dose Route Frequency Provider Last Rate Last Admin   0.9 %  sodium chloride  infusion   Intravenous Continuous Timmy Forbes, MD   Stopped at 06/13/23 1655   Review of Systems  Constitutional:  Negative for appetite change, chills, fatigue and fever.  HENT:   Positive for hearing loss. Negative for voice change.   Eyes:  Negative for eye problems.  Respiratory:  Negative for chest tightness and cough.   Cardiovascular:  Negative for chest pain.  Gastrointestinal:  Negative for abdominal distention, abdominal pain, blood in stool and nausea.  Endocrine: Negative for hot flashes.  Genitourinary:  Negative for difficulty urinating and frequency.   Musculoskeletal:  Negative for arthralgias.       Chronic intermittent back pain  Skin:  Negative for itching and rash.  Neurological:  Negative for extremity weakness.  Hematological:  Negative for adenopathy.  Psychiatric/Behavioral:  Negative for confusion.     Performance status (ECOG): 0 - Asymptomatic  Vital Signs BP (!) 142/78 (BP Location: Left Arm, Patient Position: Sitting) Comment: Rechecked BP  Pulse 84   Temp (!) 96.6 F (35.9 C) (Tympanic)   Resp 18   Wt 126 lb 1.6 oz (57.2 kg)   SpO2 96%   BMI 23.06 kg/m   Physical Exam Constitutional:      General: She is not in acute distress.    Appearance: She is not diaphoretic.  HENT:     Head: Normocephalic and atraumatic.  Eyes:     General: No scleral icterus. Cardiovascular:     Rate and Rhythm: Normal rate and regular rhythm.  Pulmonary:     Effort: Pulmonary effort is normal. No respiratory distress.  Abdominal:     General: There is no distension.     Palpations: Abdomen is soft.     Tenderness: There is no abdominal  tenderness.  Musculoskeletal:        General: Normal range of motion.     Cervical back: Normal range of motion and neck supple.  Skin:    General: Skin is dry.     Findings: No erythema.  Neurological:     Mental Status: She is alert and oriented to person, place, and time. Mental status is at baseline.  Psychiatric:        Mood and Affect: Mood and affect normal.  RADIOGRAPHIC STUDIES: I have personally reviewed the radiological images as listed and agreed with the findings in the report. CT ABDOMEN PELVIS WO CONTRAST Result Date: 05/28/2023 CLINICAL DATA:  Lower abdominal pain and diarrhea. Near syncopal episodes. On chemo and radiation for lung carcinoma. EXAM: CT ABDOMEN AND PELVIS WITHOUT CONTRAST TECHNIQUE: Multidetector CT imaging of the abdomen and pelvis was performed following the standard protocol without IV contrast. RADIATION DOSE REDUCTION: This exam was performed according to the departmental dose-optimization program which includes automated exposure control, adjustment of the mA and/or kV according to patient size and/or use of iterative reconstruction technique. COMPARISON:  CT abdomen pelvis 11/25/2020 and PET/CT 03/30/2023 FINDINGS: Lower chest: Postsurgical changes left lower lobe. No acute abnormality. Hepatobiliary: Unchanged hepatic cysts in the left hepatic lobe. Hypoattenuating in the posterior right hepatic lobe on series 2/image 24 measures 6 mm and is too small to definitively characterize but appear similar to 11/25/2020. Unremarkable gallbladder and biliary tree. Pancreas: Unremarkable. Spleen: Unremarkable. Adrenals/Urinary Tract: Normal adrenal glands. Marked left hydronephrosis has increased compared to 03/30/2023. The left ureteral stent is unchanged in position compared to recent PET/CT with the proximal loop in the distal portion of the dilated left renal pelvis. Stable right kidney. Unremarkable bladder. Stomach/Bowel: Small hiatal hernia. No bowel  obstruction. Colonic diverticulosis. Wall thickening about the sigmoid colon with mild adjacent stranding. Normal appendix. Vascular/Lymphatic: Aortic atherosclerosis. No enlarged abdominal or pelvic lymph nodes. Reproductive: No acute abnormality. Other: Stranding about the sigmoid colon. No organized fluid collection or abscess. No free intraperitoneal air. Musculoskeletal: No acute fracture or destructive osseous lesion. IMPRESSION: 1. Acute uncomplicated sigmoid diverticulitis. Consider follow-up colonoscopy following resolution of patient's acute symptoms to exclude underlying mass. 2. Marked left hydronephrosis has increased compared to 03/30/2023. The left ureteral stent is unchanged in position compared to recent PET/CT with the proximal loop in the distal portion of the dilated left renal pelvis. Query stent dysfunction. 3. Aortic Atherosclerosis (ICD10-I70.0). Electronically Signed   By: Rozell Cornet M.D.   On: 05/28/2023 03:00   MR Brain W Wo Contrast Result Date: 04/27/2023 CLINICAL DATA:  Lung cancer.  Staging. EXAM: MRI HEAD WITHOUT AND WITH CONTRAST TECHNIQUE: Multiplanar, multiecho pulse sequences of the brain and surrounding structures were obtained without and with intravenous contrast. CONTRAST:  5mL GADAVIST  GADOBUTROL  1 MMOL/ML IV SOLN COMPARISON:  07/15/2015 FINDINGS: Brain: Diffusion imaging does not show any acute or subacute infarction. Brainstem and cerebellum are normal. Cerebral hemispheres show minimal small vessel change of the white matter, less than often seen at this age. No cortical or large vessel territory infarction. No mass lesion, hemorrhage, hydrocephalus or extra-axial collection. Vascular: Major vessels at the base of the brain show flow. Persistent trigeminal artery on the left as seen on previous imaging. Redemonstration of a small aneurysm of the left supraclinoid ICA, not studied in detail. Skull and upper cervical spine: Negative Sinuses/Orbits: Clear/normal Other:  None IMPRESSION: 1. No evidence of metastatic disease. Minimal small vessel change of the cerebral hemispheric white matter, less than often seen at this age. 2. Persistent trigeminal artery on the left as seen on previous imaging. Redemonstration of a small aneurysm of the left supraclinoid ICA, not studied in detail. Electronically Signed   By: Bettylou Brunner M.D.   On: 04/27/2023 11:17   DG Chest Port 1 View Result Date: 04/19/2023 CLINICAL DATA:  Status post bronchoscopy. EXAM: PORTABLE CHEST 1 VIEW COMPARISON:  Chest radiograph dated 12/15/2022 and CT dated 03/18/2023. FINDINGS: No focal consolidation, pleural effusion, pneumothorax.  Surgical suture in the left lower lung field. The cardiac silhouette is within normal limits. Atherosclerotic calcification of the aortic arch. No acute osseous pathology. IMPRESSION: No active disease. No pneumothorax. Electronically Signed   By: Angus Bark M.D.   On: 04/19/2023 12:38   NM PET Image Initial (PI) Skull Base To Thigh Result Date: 04/05/2023 CLINICAL DATA:  Subsequent treatment strategy for lung carcinoma. Abnormal CT with enlarged pretracheal lymph CT. EXAM: NUCLEAR MEDICINE PET SKULL BASE TO THIGH TECHNIQUE: mCi F-18 FDG was injected intravenously. Full-ring PET imaging was performed from the skull base to thigh after the radiotracer. CT data was obtained and used for attenuation correction and anatomic localization. Fasting blood glucose:  mg/dl COMPARISON:  None Available. FINDINGS: NECK: No hypermetabolic lymph nodes in the neck.  Is Incidental CT findings: None. CHEST: Postsurgical change in the RIGHT upper lobe. No hypermetabolic pulmonary nodules. The enlarged pretracheal nodule of concern measures 11 mm short axis (image 43/6) and has intense metabolic activity with SUV max equal 10.1. No additional hypermetabolic mediastinal hilar nodes. No supraclavicular adenopathy. Incidental CT findings: Small nodule along the LEFT oblique fissure measuring 3  mm (image 56/6) has metabolic activity (SUV max equal 2.8) . This nodule was present on CT 03/16/2022 and similar size. Nodule also present on CT from 03/16/2021 and same size. ABDOMEN/PELVIS: No abnormal hypermetabolic activity within the liver, pancreas, adrenal glands, or spleen. No hypermetabolic lymph nodes in the abdomen or pelvis. Incidental CT findings: LEFT hydronephrosis. Double-J ureteral stent on the LEFT. The proximal stent is in the dilated LEFT renal pelvis. There is evidence of urine excretion along the course of the LEFT ureter. SKELETON: No focal hypermetabolic activity to suggest skeletal metastasis. Incidental CT findings: None. IMPRESSION: 1. Hypermetabolic pretracheal lymph node is concerning for metastatic adenopathy. 2. No evidence of local recurrence in the RIGHT upper lobe. 3. No evidence of metastatic disease in the abdomen pelvis. 4. Small nodule along the LEFT oblique fissure has metabolic activity; however, nodule has been stable in size since 03/16/2021. Favor benign nodule. 5. LEFT hydronephrosis with double-J ureteral stent in place. Electronically Signed   By: Deboraha Fallow M.D.   On: 04/05/2023 11:35   CT Chest Wo Contrast Result Date: 03/22/2023 CLINICAL DATA:  History of stage I B right upper lobe adenocarcinoma. Status post right wedge resection. * Tracking Code: BO * EXAM: CT CHEST WITHOUT CONTRAST TECHNIQUE: Multidetector CT imaging of the chest was performed following the standard protocol without IV contrast. RADIATION DOSE REDUCTION: This exam was performed according to the departmental dose-optimization program which includes automated exposure control, adjustment of the mA and/or kV according to patient size and/or use of iterative reconstruction technique. COMPARISON:  X-ray 12/15/2022.  CT scan 03/16/2022 and older. FINDINGS: Cardiovascular: On this non IV contrast exam the thoracic aorta has a normal course and caliber with moderate calcified atherosclerotic  plaque. Calcified plaque also extends along the great vessels as well as along the coronary arteries. Heart is nonenlarged. No significant pericardial effusion. There is some enlargement of the main pulmonary artery. Please correlate for evidence of pulmonary hypertension. Mediastinum/Nodes: Small thyroid  gland. Slightly patulous thoracic esophagus with small hiatal hernia. Unchanged from previous. On this non IV contrast exam there is no specific abnormal lymph node enlargement identified in the axillary regions or hila. Enlarged pretracheal lymph node identified on series 2, image 49 today measuring 19 by 13 mm. Previously 16 x 10 mm. Smaller on older examinations as well. Slightly larger than previous. There  are no other clear abnormal nodal areas of enlargement in the mediastinum. Lungs/Pleura: Stable surgical changes along left lower lobe with wedge resection. Areas of scarring and fibrotic changes. Other areas of scarring atelectatic changes are also seen. Apical pleural thickening. Surgical is also seen along the anteromedial right upper lobe. No consolidation or pneumothorax. Diffuse centrilobular emphysematous changes. There is a focal irregular apical nodule on the left previously measuring 6 x 7 mm. Today this area is slightly less nodular but there continues to be some distortion. Area is measured at 6 x 5 mm on series 4, image 26 today. The ground-glass nodular area in the posteromedial right lower lobe which previously measured 13 x 11 mm, today is similar in size and appearance on series 4, image 77. 3 mm middle lobe nodule stable today on series 4, image 89. There is also a nodule in the right upper lobe posterior to the lung hilum measuring 8 by 5 mm on series 4, image 50. This is unchanged in retrospect. This has been present and stable since at least August 2022 demonstrating long-term stability. No new dominant lung nodule identified. Upper Abdomen: Adrenal glands are preserved in the upper  abdomen. Once again there is an extrarenal pelvis left kidney, unchanged from previous with some ectasia. Benign-appearing hepatic cystic foci. Musculoskeletal: Scattered degenerative changes along the spine. IMPRESSION: Mildly enlarged pre tracheal lymph node is slightly increased in size today from previous. Recommend additional PET-CT evaluation as clinically appropriate. Alternatively, a follow-up in 3 months could be considered with the slow interval growth. Stable bilateral areas of lung nodularity and postsurgical changes. Aortic Atherosclerosis (ICD10-I70.0) and Emphysema (ICD10-J43.9). Electronically Signed   By: Adrianna Horde M.D.   On: 03/22/2023 20:40    Laboratory results were reviewed by me    Latest Ref Rng & Units 06/13/2023   12:39 PM 06/06/2023   12:54 PM 05/30/2023    9:49 AM  CBC  WBC 4.0 - 10.5 K/uL 5.2  4.6  3.3   Hemoglobin 12.0 - 15.0 g/dL 62.1  30.8  65.7   Hematocrit 36.0 - 46.0 % 41.9  43.9  35.5   Platelets 150 - 400 K/uL 151  248  151       Latest Ref Rng & Units 06/13/2023   12:40 PM 06/06/2023   12:54 PM 05/30/2023    9:49 AM  CMP  Glucose 70 - 99 mg/dL 90  846  962   BUN 8 - 23 mg/dL 21  23  13    Creatinine 0.44 - 1.00 mg/dL 9.52  8.41  3.24   Sodium 135 - 145 mmol/L 140  139  139   Potassium 3.5 - 5.1 mmol/L 4.1  5.0  3.9   Chloride 98 - 111 mmol/L 105  106  106   CO2 22 - 32 mmol/L 25  26  24    Calcium  8.9 - 10.3 mg/dL 8.9  9.3  8.4   Total Protein 6.5 - 8.1 g/dL 6.5  6.6  5.4   Total Bilirubin 0.0 - 1.2 mg/dL 0.6  0.4  0.4   Alkaline Phos 38 - 126 U/L 50  48  38   AST 15 - 41 U/L 23  24  17    ALT 0 - 44 U/L 24  36  16

## 2023-06-13 NOTE — Assessment & Plan Note (Signed)
#  History of stage Ib right lung upper lobe adenocarcinoma status post wedge resection in 2016 History of left lower lobe adenocarcinoma T1 a NX status post wedge resection 2019. Recurrence - Biopsy of pretracheal lymph node positive for NSCLC.  MRI brain is negative.  Recommend concurrent chemotherapy [carboplatin  taxol ]  with radiation.  Labs are reviewed and discussed with patient. Proceed with carboplatin  AUC2, taxol  45mg /m2.

## 2023-06-14 ENCOUNTER — Other Ambulatory Visit: Payer: Self-pay

## 2023-06-14 ENCOUNTER — Ambulatory Visit
Admission: RE | Admit: 2023-06-14 | Discharge: 2023-06-14 | Disposition: A | Discharge status: Home / Self Care | Source: Ambulatory Visit | Attending: Radiation Oncology | Admitting: Radiation Oncology

## 2023-06-14 DIAGNOSIS — C3411 Malignant neoplasm of upper lobe, right bronchus or lung: Secondary | ICD-10-CM | POA: Diagnosis not present

## 2023-06-14 LAB — RAD ONC ARIA SESSION SUMMARY
Course Elapsed Days: 29
Plan Fractions Treated to Date: 22
Plan Prescribed Dose Per Fraction: 2 Gy
Plan Total Fractions Prescribed: 33
Plan Total Prescribed Dose: 66 Gy
Reference Point Dosage Given to Date: 44 Gy
Reference Point Session Dosage Given: 2 Gy
Session Number: 22

## 2023-06-15 ENCOUNTER — Other Ambulatory Visit: Payer: Self-pay

## 2023-06-15 ENCOUNTER — Ambulatory Visit
Admission: RE | Admit: 2023-06-15 | Discharge: 2023-06-15 | Disposition: A | Discharge status: Home / Self Care | Source: Ambulatory Visit | Attending: Radiation Oncology | Admitting: Radiation Oncology

## 2023-06-15 DIAGNOSIS — C3411 Malignant neoplasm of upper lobe, right bronchus or lung: Secondary | ICD-10-CM | POA: Diagnosis not present

## 2023-06-15 LAB — RAD ONC ARIA SESSION SUMMARY
Course Elapsed Days: 30
Plan Fractions Treated to Date: 23
Plan Prescribed Dose Per Fraction: 2 Gy
Plan Total Fractions Prescribed: 33
Plan Total Prescribed Dose: 66 Gy
Reference Point Dosage Given to Date: 46 Gy
Reference Point Session Dosage Given: 2 Gy
Session Number: 23

## 2023-06-16 ENCOUNTER — Ambulatory Visit
Admission: RE | Admit: 2023-06-16 | Discharge: 2023-06-16 | Disposition: A | Discharge status: Home / Self Care | Source: Ambulatory Visit | Attending: Radiation Oncology | Admitting: Radiation Oncology

## 2023-06-16 ENCOUNTER — Other Ambulatory Visit: Payer: Self-pay

## 2023-06-16 DIAGNOSIS — C3411 Malignant neoplasm of upper lobe, right bronchus or lung: Secondary | ICD-10-CM | POA: Diagnosis not present

## 2023-06-16 LAB — RAD ONC ARIA SESSION SUMMARY
Course Elapsed Days: 31
Plan Fractions Treated to Date: 24
Plan Prescribed Dose Per Fraction: 2 Gy
Plan Total Fractions Prescribed: 33
Plan Total Prescribed Dose: 66 Gy
Reference Point Dosage Given to Date: 48 Gy
Reference Point Session Dosage Given: 2 Gy
Session Number: 24

## 2023-06-17 ENCOUNTER — Other Ambulatory Visit: Payer: Self-pay

## 2023-06-17 ENCOUNTER — Ambulatory Visit
Admission: RE | Admit: 2023-06-17 | Discharge: 2023-06-17 | Disposition: A | Discharge status: Home / Self Care | Source: Ambulatory Visit | Attending: Radiation Oncology | Admitting: Radiation Oncology

## 2023-06-17 DIAGNOSIS — C3411 Malignant neoplasm of upper lobe, right bronchus or lung: Secondary | ICD-10-CM | POA: Diagnosis not present

## 2023-06-17 LAB — RAD ONC ARIA SESSION SUMMARY
Course Elapsed Days: 32
Plan Fractions Treated to Date: 25
Plan Prescribed Dose Per Fraction: 2 Gy
Plan Total Fractions Prescribed: 33
Plan Total Prescribed Dose: 66 Gy
Reference Point Dosage Given to Date: 50 Gy
Reference Point Session Dosage Given: 2 Gy
Session Number: 25

## 2023-06-21 ENCOUNTER — Ambulatory Visit
Admission: RE | Admit: 2023-06-21 | Discharge: 2023-06-21 | Disposition: A | Discharge status: Home / Self Care | Source: Ambulatory Visit | Attending: Radiation Oncology | Admitting: Radiation Oncology

## 2023-06-21 ENCOUNTER — Encounter: Payer: Self-pay | Admitting: Oncology

## 2023-06-21 ENCOUNTER — Inpatient Hospital Stay

## 2023-06-21 ENCOUNTER — Inpatient Hospital Stay: Admitting: Oncology

## 2023-06-21 ENCOUNTER — Other Ambulatory Visit: Payer: Self-pay

## 2023-06-21 VITALS — BP 139/58 | HR 67

## 2023-06-21 VITALS — BP 145/58 | HR 62 | Temp 96.5°F | Resp 18 | Wt 124.3 lb

## 2023-06-21 DIAGNOSIS — T451X5A Adverse effect of antineoplastic and immunosuppressive drugs, initial encounter: Secondary | ICD-10-CM

## 2023-06-21 DIAGNOSIS — C3411 Malignant neoplasm of upper lobe, right bronchus or lung: Secondary | ICD-10-CM

## 2023-06-21 DIAGNOSIS — Z5111 Encounter for antineoplastic chemotherapy: Secondary | ICD-10-CM

## 2023-06-21 DIAGNOSIS — C3432 Malignant neoplasm of lower lobe, left bronchus or lung: Secondary | ICD-10-CM

## 2023-06-21 DIAGNOSIS — R110 Nausea: Secondary | ICD-10-CM

## 2023-06-21 LAB — CMP (CANCER CENTER ONLY)
ALT: 21 U/L (ref 0–44)
AST: 23 U/L (ref 15–41)
Albumin: 3.6 g/dL (ref 3.5–5.0)
Alkaline Phosphatase: 50 U/L (ref 38–126)
Anion gap: 9 (ref 5–15)
BUN: 28 mg/dL — ABNORMAL HIGH (ref 8–23)
CO2: 23 mmol/L (ref 22–32)
Calcium: 8.7 mg/dL — ABNORMAL LOW (ref 8.9–10.3)
Chloride: 106 mmol/L (ref 98–111)
Creatinine: 0.64 mg/dL (ref 0.44–1.00)
GFR, Estimated: >60 mL/min (ref >60)
Glucose, Bld: 114 mg/dL — ABNORMAL HIGH (ref 70–99)
Potassium: 4.2 mmol/L (ref 3.5–5.1)
Sodium: 138 mmol/L (ref 135–145)
Total Bilirubin: 0.5 mg/dL (ref 0.0–1.2)
Total Protein: 6 g/dL — ABNORMAL LOW (ref 6.5–8.1)

## 2023-06-21 LAB — CBC WITH DIFFERENTIAL (CANCER CENTER ONLY)
Abs Immature Granulocytes: 0.01 10*3/uL (ref 0.00–0.07)
Basophils Absolute: 0 10*3/uL (ref 0.0–0.1)
Basophils Relative: 1 %
Eosinophils Absolute: 0.1 10*3/uL (ref 0.0–0.5)
Eosinophils Relative: 2 %
HCT: 39.7 % (ref 36.0–46.0)
Hemoglobin: 13.2 g/dL (ref 12.0–15.0)
Immature Granulocytes: 0 %
Lymphocytes Relative: 28 %
Lymphs Abs: 0.9 10*3/uL (ref 0.7–4.0)
MCH: 32.7 pg (ref 26.0–34.0)
MCHC: 33.2 g/dL (ref 30.0–36.0)
MCV: 98.3 fL (ref 80.0–100.0)
Monocytes Absolute: 0.3 10*3/uL (ref 0.1–1.0)
Monocytes Relative: 11 %
Neutro Abs: 1.9 10*3/uL (ref 1.7–7.7)
Neutrophils Relative %: 58 %
Platelet Count: 115 10*3/uL — ABNORMAL LOW (ref 150–400)
RBC: 4.04 MIL/uL (ref 3.87–5.11)
RDW: 13.4 % (ref 11.5–15.5)
WBC Count: 3.2 10*3/uL — ABNORMAL LOW (ref 4.0–10.5)
nRBC: 0 % (ref 0.0–0.2)

## 2023-06-21 LAB — RAD ONC ARIA SESSION SUMMARY
Course Elapsed Days: 36
Plan Fractions Treated to Date: 26
Plan Prescribed Dose Per Fraction: 2 Gy
Plan Total Fractions Prescribed: 33
Plan Total Prescribed Dose: 66 Gy
Reference Point Dosage Given to Date: 52 Gy
Reference Point Session Dosage Given: 2 Gy
Session Number: 26

## 2023-06-21 MED ORDER — SODIUM CHLORIDE 0.9 % IV SOLN
125.6000 mg | Freq: Once | INTRAVENOUS | Status: AC
  Administered 2023-06-21: 130 mg via INTRAVENOUS
  Filled 2023-06-21: qty 13

## 2023-06-21 MED ORDER — PALONOSETRON HCL INJECTION 0.25 MG/5ML
0.2500 mg | Freq: Once | INTRAVENOUS | Status: AC
  Administered 2023-06-21: 0.25 mg via INTRAVENOUS

## 2023-06-21 MED ORDER — SODIUM CHLORIDE 0.9 % IV SOLN
50.0000 mg/m2 | Freq: Once | INTRAVENOUS | Status: AC
  Administered 2023-06-21: 78 mg via INTRAVENOUS
  Filled 2023-06-21: qty 13

## 2023-06-21 MED ORDER — FAMOTIDINE IN NACL 20-0.9 MG/50ML-% IV SOLN
20.0000 mg | Freq: Once | INTRAVENOUS | Status: AC
  Administered 2023-06-21: 20 mg via INTRAVENOUS

## 2023-06-21 MED ORDER — DIPHENHYDRAMINE HCL 50 MG/ML IJ SOLN
25.0000 mg | Freq: Once | INTRAMUSCULAR | Status: AC
  Administered 2023-06-21: 25 mg via INTRAVENOUS

## 2023-06-21 MED ORDER — DEXAMETHASONE SODIUM PHOSPHATE 10 MG/ML IJ SOLN
10.0000 mg | Freq: Once | INTRAMUSCULAR | Status: AC
  Administered 2023-06-21: 10 mg via INTRAVENOUS

## 2023-06-21 MED ORDER — SODIUM CHLORIDE 0.9 % IV SOLN
INTRAVENOUS | Status: DC
  Filled 2023-06-21 (×2): qty 250

## 2023-06-21 NOTE — Assessment & Plan Note (Signed)
Same plan as above.  

## 2023-06-21 NOTE — Assessment & Plan Note (Signed)
 Chemotherapy plan as listed above

## 2023-06-21 NOTE — Progress Notes (Signed)
 Hematology/Oncology Progress note [phone] [phone]     Chief Complaint:  Follow up for recurrent non small cell lung cancer   ASSESSMENT & PLAN:  Heather [name] is a 88 y.o. [name] female with stage IB adenocarcinoma of the right upper lobe status post wedge resection on 03/13/2014. T2aN1M0 (stage IB).  She is s/p wedge resection of a left lower lobe mass on 03/10/2017. T1aNx (stage IA). No with locally recurrent NSCLC   Malignant neoplasm of lower lobe of left lung (HCC) #History of stage Ib right lung upper lobe adenocarcinoma status post wedge resection in 2016 History of left lower lobe adenocarcinoma T1 a NX status post wedge resection 2019. Recurrence - Biopsy of pretracheal lymph node positive for NSCLC.  MRI brain is negative.  Recommend concurrent chemotherapy [carboplatin  taxol ]  with radiation.  Labs are reviewed and discussed with patient. Proceed with carboplatin  AUC2, taxol  45mg /m2.      Chemotherapy-induced nausea Continue antiemetics as needed per instruction.  Encounter for antineoplastic chemotherapy Chemotherapy plan as listed above.   Primary cancer of right upper lobe of lung [facility]) Same plan as above.    No orders of the defined types were placed in this encounter.  Follow up 1 week.   All questions were answered. The patient knows to call the clinic with any problems, questions or concerns.  We spent sufficient time to discuss many aspect of care, questions were answered to patient's satisfaction.   Timmy Forbes, MD, PhD [facility] Hematology Oncology 06/21/2023   PERTINENT ONCOLOGY HISTORY Previously follows up with Dr.Corcoran. Estasblish care with me on 03/15/2018  PET scan on 02/13/2014 revealed a 1 cm right upper lobe hypermetabolic nodule (SUV 3.8) and a 6 mm right upper lobe nodule (no metabolic activity). There was no mediastianl adenopathy.  stage IB adenocarcinoma of the right upper lobe status post wedge resection on  03/13/2014. Pathology revealed a 1.1 cm high-grade adenocarcinoma with pleural invasion. Nodes were negative. Pathologic stage was T2aN1M0 (stage IB).  Chest CT on 02/10/2017 revealed clear interval progression of the posterior left lower lobe pulmonary nodule, now measuring 12 mm in long axis and having a distinctly lobular contour. Imaging features were very concerning for neoplasm.  There was no change in the 7 mm nodule posterior to the right hilum.  PET scan on 02/18/2017 revealed a 11 x 8 mm posterior left lower lobe pulmonary nodule that had mildly progressed from prior studies and demonstrated mild hypermetabolism (SUV 1.6).  The appearance was considered worrisome for an indolent primary bronchogenic neoplasm.  03/10/2017.  s/p wedge resection of a left lower lobe mass.   Pathology revealed an 8 mm invasive adenocarcinoma with solid and acinar patterns.  There was no visceral invasion.  There was no lymphovascular invasion.  All margins were negative.  Pathologic stage was T1aNx (stage IA).   INTERVAL HISTORY Heather [name] is a 88 y.o. female who has above history reviewed by me today presents for follow up visit for management of recurrent lung cancer, .  Patient reports feeling well.  She was accompanied by her husband Denies shortness of breath, fatigue, hemoptysis, unintentional weight loss. \No new complaints. Denies nausea vomiting, diarrhea, dysuria, flank pain or fever.    Past Medical History:  Diagnosis Date   Anginal pain (HCC)    Benign essential hypertension    Brain aneurysm    Carotid artery stenosis    Carotid stenosis 06/19/2013   Colonic polyp    Emphysema lung (HCC)  GERD (gastroesophageal reflux disease)    Hiatal hernia    Hypercholesteremia    Hyperlipidemia, mixed 06/09/2015   Lung cancer (HCC) 2016   Lung nodule 06/21/2014   Macrocytic 10/06/2014   Multiple thyroid  nodules    PAT (paroxysmal atrial tachycardia) (HCC) 06/19/2013   Plantar fasciitis      Past Surgical History:  Procedure Laterality Date   BREAST EXCISIONAL BIOPSY Right 40 yrs ago   neg   BREAST LUMPECTOMY     benign   CATARACT EXTRACTION W/ INTRAOCULAR LENS  IMPLANT, BILATERAL Bilateral    CEREBRAL ANEURYSM REPAIR  2004   coil treatment   CERVIX LESION DESTRUCTION     COLONOSCOPY     1995, 2006, 2014   COLONOSCOPY WITH PROPOFOL  N/A 03/31/2015   Procedure: COLONOSCOPY WITH PROPOFOL ;  Surgeon: Cassie Click, MD;  Location: [facility] ENDOSCOPY;  Service: Endoscopy;  Laterality: N/A;   CYSTOSCOPY W/ RETROGRADES Left 03/02/2022   Procedure: CYSTOSCOPY WITH RETROGRADE PYELOGRAM;  Surgeon: Geraline Knapp, MD;  Location: [facility] ORS;  Service: Urology;  Laterality: Left;   CYSTOSCOPY W/ RETROGRADES Left 12/28/2022   Procedure: CYSTOSCOPY WITH RETROGRADE PYELOGRAM;  Surgeon: Geraline Knapp, MD;  Location: [facility] ORS;  Service: Urology;  Laterality: Left;   CYSTOSCOPY W/ URETERAL STENT PLACEMENT Left 03/03/2021   Procedure: CYSTOSCOPY WITH EXCHANGE;  Surgeon: Geraline Knapp, MD;  Location: [facility] ORS;  Service: Urology;  Laterality: Left;   CYSTOSCOPY W/ URETERAL STENT PLACEMENT Left 03/02/2022   Procedure: CYSTOSCOPY WITH STENT EXCHANGE;  Surgeon: Geraline Knapp, MD;  Location: [facility] ORS;  Service: Urology;  Laterality: Left;   CYSTOSCOPY W/ URETERAL STENT PLACEMENT Left 12/28/2022   Procedure: CYSTOSCOPY WITH STENT EXCHANGE;  Surgeon: Geraline Knapp, MD;  Location: [facility] ORS;  Service: Urology;  Laterality: Left;   CYSTOSCOPY WITH STENT PLACEMENT Left 11/25/2020   Procedure: CYSTOSCOPY WITH STENT PLACEMENT;  Surgeon: Geraline Knapp, MD;  Location: [facility] ORS;  Service: Urology;  Laterality: Left;   ENDOBRONCHIAL ULTRASOUND Bilateral 04/19/2023   Procedure: ENDOBRONCHIAL ULTRASOUND (EBUS);  Surgeon: Vergia Glasgow, MD;  Location: [facility] ORS;  Service: Pulmonary;  Laterality: Bilateral;   FLEXIBLE BRONCHOSCOPY N/A 03/10/2017   Procedure: FLEXIBLE BRONCHOSCOPY;  Surgeon: Petra Brandy, MD;  Location: [facility] ORS;  Service: Thoracic;  Laterality: N/A;   THORACOSCOPY WITH WEDGE RESECTION LUNG Right 03/13/2014   RUL   THORACOTOMY/LOBECTOMY Left 03/10/2017   Procedure: THORACOTOMY WITH LUNG WEDGE RESECTION POSSIBLE LOBECTOMY;  Surgeon: Petra Brandy, MD;  Location: [facility] ORS;  Service: Thoracic;  Laterality: Left;   TUBAL LIGATION      Family History  Problem Relation Age of Onset   Alcohol abuse Mother    Heart attack Father    Breast cancer Neg Hx     Social History:  reports that she quit smoking about 35 years ago. Her smoking use included cigarettes. She started smoking about 70 years ago. She has a 35 pack-year smoking history. She has never used smokeless tobacco. She reports current alcohol use. She reports that she does not use drugs.   Allergies:  Allergies  Allergen Reactions   Ace Inhibitors Swelling   Contrast Media [Iodinated Contrast Media]    Cortisone     Patient had facial swelling and itching from cortisone injection IM    Current Medications: Current Outpatient Medications  Medication Sig Dispense Refill   amLODipine  (NORVASC ) 5 MG tablet Take 5 mg by mouth at bedtime.     atenolol  (TENORMIN ) 50 MG tablet Take  50 mg by mouth at bedtime.     calcium -vitamin D  (OSCAL WITH D) 500-200 MG-UNIT tablet Take 1 tablet by mouth daily with breakfast.     cetirizine (ZYRTEC) 10 MG tablet Take 10 mg by mouth daily.     dextromethorphan-guaiFENesin  (MUCINEX  DM) 30-600 MG 12hr tablet Take 1 tablet by mouth 2 (two) times daily as needed for cough.     donepezil  (ARICEPT ) 10 MG tablet Take 1 tablet by mouth at bedtime.     fluticasone  (FLONASE ) 50 MCG/ACT nasal spray Place 1 spray into the nose daily as needed for allergies.      ondansetron  (ZOFRAN ) 8 MG tablet Take 1 tablet (8 mg total) by mouth every 8 (eight) hours as needed for nausea or vomiting. Start on the third day after chemotherapy. 30 tablet 1   simvastatin  (ZOCOR ) 20 MG tablet Take 20 mg by  mouth every morning.     vitamin B-12 (CYANOCOBALAMIN ) 100 MCG tablet Take 100 mcg by mouth daily.     No current facility-administered medications for this visit.   Facility-Administered Medications Ordered in Other Visits  Medication Dose Route Frequency Provider Last Rate Last Admin   0.9 %  sodium chloride  infusion   Intravenous Continuous Timmy Forbes, MD   Stopped at 06/21/23 1701   Review of Systems  Constitutional:  Negative for appetite change, chills, fatigue and fever.  HENT:   Positive for hearing loss. Negative for voice change.   Eyes:  Negative for eye problems.  Respiratory:  Negative for chest tightness and cough.   Cardiovascular:  Negative for chest pain.  Gastrointestinal:  Negative for abdominal distention, abdominal pain, blood in stool and nausea.  Endocrine: Negative for hot flashes.  Genitourinary:  Negative for difficulty urinating and frequency.   Musculoskeletal:  Negative for arthralgias.       Chronic intermittent back pain  Skin:  Negative for itching and rash.  Neurological:  Negative for extremity weakness.  Hematological:  Negative for adenopathy.  Psychiatric/Behavioral:  Negative for confusion.     Performance status (ECOG): 0 - Asymptomatic  Vital Signs BP (!) 145/58 (BP Location: Left Arm, Patient Position: Sitting, Cuff Size: Normal)   Pulse 62   Temp (!) 96.5 F (35.8 C) (Tympanic)   Resp 18   Wt 124 lb 4.8 oz (56.4 kg)   SpO2 97%   BMI 22.73 kg/m   Physical Exam Constitutional:      General: She is not in acute distress.    Appearance: She is not diaphoretic.  HENT:     Head: Normocephalic and atraumatic.  Eyes:     General: No scleral icterus. Cardiovascular:     Rate and Rhythm: Normal rate and regular rhythm.  Pulmonary:     Effort: Pulmonary effort is normal. No respiratory distress.  Abdominal:     General: There is no distension.     Palpations: Abdomen is soft.     Tenderness: There is no abdominal tenderness.   Musculoskeletal:        General: Normal range of motion.     Cervical back: Normal range of motion and neck supple.  Skin:    General: Skin is dry.     Findings: No erythema.  Neurological:     Mental Status: She is alert and oriented to person, place, and time. Mental status is at baseline.  Psychiatric:        Mood and Affect: Mood and affect normal.     RADIOGRAPHIC STUDIES: I have  personally reviewed the radiological images as listed and agreed with the findings in the report. CT ABDOMEN PELVIS WO CONTRAST Result Date: 05/28/2023 CLINICAL DATA:  Lower abdominal pain and diarrhea. Near syncopal episodes. On chemo and radiation for lung carcinoma. EXAM: CT ABDOMEN AND PELVIS WITHOUT CONTRAST TECHNIQUE: Multidetector CT imaging of the abdomen and pelvis was performed following the standard protocol without IV contrast. RADIATION DOSE REDUCTION: This exam was performed according to the departmental dose-optimization program which includes automated exposure control, adjustment of the mA and/or kV according to patient size and/or use of iterative reconstruction technique. COMPARISON:  CT abdomen pelvis 11/25/2020 and PET/CT 03/30/2023 FINDINGS: Lower chest: Postsurgical changes left lower lobe. No acute abnormality. Hepatobiliary: Unchanged hepatic cysts in the left hepatic lobe. Hypoattenuating in the posterior right hepatic lobe on series 2/image 24 measures 6 mm and is too small to definitively characterize but appear similar to 11/25/2020. Unremarkable gallbladder and biliary tree. Pancreas: Unremarkable. Spleen: Unremarkable. Adrenals/Urinary Tract: Normal adrenal glands. Marked left hydronephrosis has increased compared to 03/30/2023. The left ureteral stent is unchanged in position compared to recent PET/CT with the proximal loop in the distal portion of the dilated left renal pelvis. Stable right kidney. Unremarkable bladder. Stomach/Bowel: Small hiatal hernia. No bowel obstruction. Colonic  diverticulosis. Wall thickening about the sigmoid colon with mild adjacent stranding. Normal appendix. Vascular/Lymphatic: Aortic atherosclerosis. No enlarged abdominal or pelvic lymph nodes. Reproductive: No acute abnormality. Other: Stranding about the sigmoid colon. No organized fluid collection or abscess. No free intraperitoneal air. Musculoskeletal: No acute fracture or destructive osseous lesion. IMPRESSION: 1. Acute uncomplicated sigmoid diverticulitis. Consider follow-up colonoscopy following resolution of patient's acute symptoms to exclude underlying mass. 2. Marked left hydronephrosis has increased compared to 03/30/2023. The left ureteral stent is unchanged in position compared to recent PET/CT with the proximal loop in the distal portion of the dilated left renal pelvis. Query stent dysfunction. 3. Aortic Atherosclerosis (ICD10-I70.0). Electronically Signed   By: Rozell Cornet M.D.   On: 05/28/2023 03:00   MR Brain W Wo Contrast Result Date: 04/27/2023 CLINICAL DATA:  Lung cancer.  Staging. EXAM: MRI HEAD WITHOUT AND WITH CONTRAST TECHNIQUE: Multiplanar, multiecho pulse sequences of the brain and surrounding structures were obtained without and with intravenous contrast. CONTRAST:  5mL GADAVIST  GADOBUTROL  1 MMOL/ML IV SOLN COMPARISON:  07/15/2015 FINDINGS: Brain: Diffusion imaging does not show any acute or subacute infarction. Brainstem and cerebellum are normal. Cerebral hemispheres show minimal small vessel change of the white matter, less than often seen at this age. No cortical or large vessel territory infarction. No mass lesion, hemorrhage, hydrocephalus or extra-axial collection. Vascular: Major vessels at the base of the brain show flow. Persistent trigeminal artery on the left as seen on previous imaging. Redemonstration of a small aneurysm of the left supraclinoid ICA, not studied in detail. Skull and upper cervical spine: Negative Sinuses/Orbits: Clear/normal Other: None IMPRESSION: 1.  No evidence of metastatic disease. Minimal small vessel change of the cerebral hemispheric white matter, less than often seen at this age. 2. Persistent trigeminal artery on the left as seen on previous imaging. Redemonstration of a small aneurysm of the left supraclinoid ICA, not studied in detail. Electronically Signed   By: Bettylou Brunner M.D.   On: 04/27/2023 11:17   DG Chest Port 1 View Result Date: 04/19/2023 CLINICAL DATA:  Status post bronchoscopy. EXAM: PORTABLE CHEST 1 VIEW COMPARISON:  Chest radiograph dated 12/15/2022 and CT dated 03/18/2023. FINDINGS: No focal consolidation, pleural effusion, pneumothorax. Surgical suture in the  left lower lung field. The cardiac silhouette is within normal limits. Atherosclerotic calcification of the aortic arch. No acute osseous pathology. IMPRESSION: No active disease. No pneumothorax. Electronically Signed   By: Angus Bark M.D.   On: 04/19/2023 12:38   NM PET Image Initial (PI) Skull Base To Thigh Result Date: 04/05/2023 CLINICAL DATA:  Subsequent treatment strategy for lung carcinoma. Abnormal CT with enlarged pretracheal lymph CT. EXAM: NUCLEAR MEDICINE PET SKULL BASE TO THIGH TECHNIQUE: mCi F-18 FDG was injected intravenously. Full-ring PET imaging was performed from the skull base to thigh after the radiotracer. CT data was obtained and used for attenuation correction and anatomic localization. Fasting blood glucose:  mg/dl COMPARISON:  None Available. FINDINGS: NECK: No hypermetabolic lymph nodes in the neck.  Is Incidental CT findings: None. CHEST: Postsurgical change in the RIGHT upper lobe. No hypermetabolic pulmonary nodules. The enlarged pretracheal nodule of concern measures 11 mm short axis (image 43/6) and has intense metabolic activity with SUV max equal 10.1. No additional hypermetabolic mediastinal hilar nodes. No supraclavicular adenopathy. Incidental CT findings: Small nodule along the LEFT oblique fissure measuring 3 mm (image 56/6) has  metabolic activity (SUV max equal 2.8) . This nodule was present on CT 03/16/2022 and similar size. Nodule also present on CT from 03/16/2021 and same size. ABDOMEN/PELVIS: No abnormal hypermetabolic activity within the liver, pancreas, adrenal glands, or spleen. No hypermetabolic lymph nodes in the abdomen or pelvis. Incidental CT findings: LEFT hydronephrosis. Double-J ureteral stent on the LEFT. The proximal stent is in the dilated LEFT renal pelvis. There is evidence of urine excretion along the course of the LEFT ureter. SKELETON: No focal hypermetabolic activity to suggest skeletal metastasis. Incidental CT findings: None. IMPRESSION: 1. Hypermetabolic pretracheal lymph node is concerning for metastatic adenopathy. 2. No evidence of local recurrence in the RIGHT upper lobe. 3. No evidence of metastatic disease in the abdomen pelvis. 4. Small nodule along the LEFT oblique fissure has metabolic activity; however, nodule has been stable in size since 03/16/2021. Favor benign nodule. 5. LEFT hydronephrosis with double-J ureteral stent in place. Electronically Signed   By: Deboraha Fallow M.D.   On: 04/05/2023 11:35    Laboratory results were reviewed by me    Latest Ref Rng & Units 06/21/2023   12:55 PM 06/13/2023   12:39 PM 06/06/2023   12:54 PM  CBC  WBC 4.0 - 10.5 K/uL 3.2  5.2  4.6   Hemoglobin 12.0 - 15.0 g/dL 78.2  95.6  21.3   Hematocrit 36.0 - 46.0 % 39.7  41.9  43.9   Platelets 150 - 400 K/uL 115  151  248       Latest Ref Rng & Units 06/21/2023   12:54 PM 06/13/2023   12:40 PM 06/06/2023   12:54 PM  CMP  Glucose 70 - 99 mg/dL 086  90  578   BUN 8 - 23 mg/dL 28  21  23    Creatinine 0.44 - 1.00 mg/dL 4.69  6.29  5.28   Sodium 135 - 145 mmol/L 138  140  139   Potassium 3.5 - 5.1 mmol/L 4.2  4.1  5.0   Chloride 98 - 111 mmol/L 106  105  106   CO2 22 - 32 mmol/L 23  25  26    Calcium  8.9 - 10.3 mg/dL 8.7  8.9  9.3   Total Protein 6.5 - 8.1 g/dL 6.0  6.5  6.6   Total Bilirubin 0.0 - 1.2  mg/dL 0.5  0.6  0.4   Alkaline Phos 38 - 126 U/L 50  50  48   AST 15 - 41 U/L 23  23  24    ALT 0 - 44 U/L 21  24  36

## 2023-06-21 NOTE — Assessment & Plan Note (Signed)
#  History of stage Ib right lung upper lobe adenocarcinoma status post wedge resection in 2016 History of left lower lobe adenocarcinoma T1 a NX status post wedge resection 2019. Recurrence - Biopsy of pretracheal lymph node positive for NSCLC.  MRI brain is negative.  Recommend concurrent chemotherapy [carboplatin  taxol ]  with radiation.  Labs are reviewed and discussed with patient. Proceed with carboplatin  AUC2, taxol  45mg /m2.

## 2023-06-21 NOTE — Progress Notes (Signed)
 Nutrition Assessment   Reason for Assessment:   Patient identified on Malnutrition Screening report for poor appetite and weight loss   ASSESSMENT:  88 year old female with recurrent non small cell lung cancer.  S/p right upper lobe s/p wedge resection on 03/13/14.  Noted recent admission with diverticulitis.  Past medical history of HTN, HLD.  Patient receiving carboplatin , taxol  and radiation  Met with patient and husband.  Reports that appetite is so-so.  Usually has 1/2 bagel with peanut butter or eggs and breakfast meat and toast or yogurt.  Lunch maybe an apple and pack of nabs.  Dinner is usually meat and couple of sides.  Reports that friends and family have provided them with so much food.  Drinks ensure 2-3 times a day.  Feeling better following hospitalization.  Last radiation treatment planned for 6/5.  Denies nausea or stomach upset.   Medications: zofran , vit B 12, calcium  and vit d   Labs: glucose 114, calcium  8.7   Anthropometrics:   Height: 62 inches Weight: 124 lb 128 lb on 5/12 BMI: 22   3% weight loss in the last 2 weeks,concerning  Estimated Energy Needs  Kcals: 1400-1680 Protein: 70-84 g Fluid: 1400-1680 ml   NUTRITION DIAGNOSIS: Inadequate oral intake related to cancer and related treatment side effects as evidenced by 3% weight loss in the last 2 weeks and decreased intake.   INTERVENTION:  Recommend 350 calorie oral nutrition supplement for added nutrition. Coupons given Encouraged eating q 1-2 hours (mini snack) Encouraged good source of protein Contact information provided   MONITORING, EVALUATION, GOAL: weight trends, intake   Next Visit: phone call Tuesday, June 17   Vertis Scheib B. Zollie Hipp, CSO, LDN Registered Dietitian [phone]

## 2023-06-21 NOTE — Patient Instructions (Signed)
 [facility] - A DEPT OF [facility]  Discharge Instructions: Thank you for choosing [facility] to provide your oncology and hematology care.  If you have a lab appointment with the Cancer Center, please go directly to the Cancer Center and check in at the registration area.  Wear comfortable clothing and clothing appropriate for easy access to any Portacath or PICC line.   We strive to give you quality time with your provider. You may need to reschedule your appointment if you arrive late (15 or more minutes).  Arriving late affects you and other patients whose appointments are after yours.  Also, if you miss three or more appointments without notifying the office, you may be dismissed from the clinic at the provider's discretion.      For prescription refill requests, have your pharmacy contact our office and allow 72 hours for refills to be completed.    Today you received the following chemotherapy and/or immunotherapy agents TAXOL and CARBOPLATIN       To help prevent nausea and vomiting after your treatment, we encourage you to take your nausea medication as directed.  BELOW ARE SYMPTOMS THAT SHOULD BE REPORTED IMMEDIATELY: *FEVER GREATER THAN 100.4 F (38 C) OR HIGHER *CHILLS OR SWEATING *NAUSEA AND VOMITING THAT IS NOT CONTROLLED WITH YOUR NAUSEA MEDICATION *UNUSUAL SHORTNESS OF BREATH *UNUSUAL BRUISING OR BLEEDING *URINARY PROBLEMS (pain or burning when urinating, or frequent urination) *BOWEL PROBLEMS (unusual diarrhea, constipation, pain near the anus) TENDERNESS IN MOUTH AND THROAT WITH OR WITHOUT PRESENCE OF ULCERS (sore throat, sores in mouth, or a toothache) UNUSUAL RASH, SWELLING OR PAIN  UNUSUAL VAGINAL DISCHARGE OR ITCHING   Items with * indicate a potential emergency and should be followed up as soon as possible or go to the Emergency Department if any problems should occur.  Please show the CHEMOTHERAPY ALERT CARD or  IMMUNOTHERAPY ALERT CARD at check-in to the Emergency Department and triage nurse.  Should you have questions after your visit or need to cancel or reschedule your appointment, please contact [facility] - A DEPT OF [facility]  [phone] and follow the prompts.  Office hours are 8:00 a.m. to 4:30 p.m. Monday - Friday. Please note that voicemails left after 4:00 p.m. may not be returned until the following business day.  We are closed weekends and major holidays. You have access to a nurse at all times for urgent questions. Please call the main number to the clinic [phone] and follow the prompts.  For any non-urgent questions, you may also contact your provider using MyChart. We now offer e-Visits for anyone 83 and older to request care online for non-urgent symptoms. For details visit [email or web address].   Also download the MyChart app! Go to the app store, search "MyChart", open the app, select [facility], and log in with your MyChart username and password.  Paclitaxel Injection What is this medication? PACLITAXEL (PAK li TAX el) treats some types of cancer. It works by slowing down the growth of cancer cells. This medicine may be used for other purposes; ask your health care provider or pharmacist if you have questions. COMMON BRAND NAME(S): Onxol, Taxol What should I tell my care team before I take this medication? They need to know if you have any of these conditions: Heart disease Liver disease Low white blood cell levels An unusual or allergic reaction to paclitaxel, other medications, foods, dyes, or preservatives  If you or your partner are pregnant or trying to get pregnant Breast-feeding How should I use this medication? This medication is injected into a vein. It is given by your care team in a hospital or clinic setting. Talk to your care team about the use of this medication in children. While it may be given to children for selected  conditions, precautions do apply. Overdosage: If you think you have taken too much of this medicine contact a poison control center or emergency room at once. NOTE: This medicine is only for you. Do not share this medicine with others. What if I miss a dose? Keep appointments for follow-up doses. It is important not to miss your dose. Call your care team if you are unable to keep an appointment. What may interact with this medication? Do not take this medication with any of the following: Live virus vaccines Other medications may affect the way this medication works. Talk with your care team about all of the medications you take. They may suggest changes to your treatment plan to lower the risk of side effects and to make sure your medications work as intended. This list may not describe all possible interactions. Give your health care provider a list of all the medicines, herbs, non-prescription drugs, or dietary supplements you use. Also tell them if you smoke, drink alcohol, or use illegal drugs. Some items may interact with your medicine. What should I watch for while using this medication? Your condition will be monitored carefully while you are receiving this medication. You may need blood work while taking this medication. This medication may make you feel generally unwell. This is not uncommon as chemotherapy can affect healthy cells as well as cancer cells. Report any side effects. Continue your course of treatment even though you feel ill unless your care team tells you to stop. This medication can cause serious allergic reactions. To reduce the risk, your care team may give you other medications to take before receiving this one. Be sure to follow the directions from your care team. This medication may increase your risk of getting an infection. Call your care team for advice if you get a fever, chills, sore throat, or other symptoms of a cold or flu. Do not treat yourself. Try to avoid  being around people who are sick. This medication may increase your risk to bruise or bleed. Call your care team if you notice any unusual bleeding. Be careful brushing or flossing your teeth or using a toothpick because you may get an infection or bleed more easily. If you have any dental work done, tell your dentist you are receiving this medication. Talk to your care team if you may be pregnant. Serious birth defects can occur if you take this medication during pregnancy. Talk to your care team before breastfeeding. Changes to your treatment plan may be needed. What side effects may I notice from receiving this medication? Side effects that you should report to your care team as soon as possible: Allergic reactions--skin rash, itching, hives, swelling of the face, lips, tongue, or throat Heart rhythm changes--fast or irregular heartbeat, dizziness, feeling faint or lightheaded, chest pain, trouble breathing Increase in blood pressure Infection--fever, chills, cough, sore throat, wounds that don't heal, pain or trouble when passing urine, general feeling of discomfort or being unwell Low blood pressure--dizziness, feeling faint or lightheaded, blurry vision Low red blood cell level--unusual weakness or fatigue, dizziness, headache, trouble breathing Painful swelling, warmth, or redness of the skin, blisters  or sores at the infusion site Pain, tingling, or numbness in the hands or feet Slow heartbeat--dizziness, feeling faint or lightheaded, confusion, trouble breathing, unusual weakness or fatigue Unusual bruising or bleeding Side effects that usually do not require medical attention (report to your care team if they continue or are bothersome): Diarrhea Hair loss Joint pain Loss of appetite Muscle pain Nausea Vomiting This list may not describe all possible side effects. Call your doctor for medical advice about side effects. You may report side effects to FDA at 1-800-FDA-1088. Where  should I keep my medication? This medication is given in a hospital or clinic. It will not be stored at home. NOTE: This sheet is a summary. It may not cover all possible information. If you have questions about this medicine, talk to your doctor, pharmacist, or health care provider.  2024 Elsevier/Gold Standard (2021-06-02 00:00:00)  Carboplatin Injection What is this medication? CARBOPLATIN (KAR boe pla tin) treats some types of cancer. It works by slowing down the growth of cancer cells. This medicine may be used for other purposes; ask your health care provider or pharmacist if you have questions. COMMON BRAND NAME(S): Paraplatin What should I tell my care team before I take this medication? They need to know if you have any of these conditions: Blood disorders Hearing problems Kidney disease Recent or ongoing radiation therapy An unusual or allergic reaction to carboplatin, cisplatin, other medications, foods, dyes, or preservatives Pregnant or trying to get pregnant Breast-feeding How should I use this medication? This medication is injected into a vein. It is given by your care team in a hospital or clinic setting. Talk to your care team about the use of this medication in children. Special care may be needed. Overdosage: If you think you have taken too much of this medicine contact a poison control center or emergency room at once. NOTE: This medicine is only for you. Do not share this medicine with others. What if I miss a dose? Keep appointments for follow-up doses. It is important not to miss your dose. Call your care team if you are unable to keep an appointment. What may interact with this medication? Medications for seizures Some antibiotics, such as amikacin, gentamicin, neomycin, streptomycin, tobramycin Vaccines This list may not describe all possible interactions. Give your health care provider a list of all the medicines, herbs, non-prescription drugs, or dietary  supplements you use. Also tell them if you smoke, drink alcohol, or use illegal drugs. Some items may interact with your medicine. What should I watch for while using this medication? Your condition will be monitored carefully while you are receiving this medication. You may need blood work while taking this medication. This medication may make you feel generally unwell. This is not uncommon, as chemotherapy can affect healthy cells as well as cancer cells. Report any side effects. Continue your course of treatment even though you feel ill unless your care team tells you to stop. In some cases, you may be given additional medications to help with side effects. Follow all directions for their use. This medication may increase your risk of getting an infection. Call your care team for advice if you get a fever, chills, sore throat, or other symptoms of a cold or flu. Do not treat yourself. Try to avoid being around people who are sick. Avoid taking medications that contain aspirin, acetaminophen, ibuprofen, naproxen, or ketoprofen unless instructed by your care team. These medications may hide a fever. Be careful brushing or flossing your  teeth or using a toothpick because you may get an infection or bleed more easily. If you have any dental work done, tell your dentist you are receiving this medication. Talk to your care team if you wish to become pregnant or think you might be pregnant. This medication can cause serious birth defects. Talk to your care team about effective forms of contraception. Do not breast-feed while taking this medication. What side effects may I notice from receiving this medication? Side effects that you should report to your care team as soon as possible: Allergic reactions--skin rash, itching, hives, swelling of the face, lips, tongue, or throat Infection--fever, chills, cough, sore throat, wounds that don't heal, pain or trouble when passing urine, general feeling of  discomfort or being unwell Low red blood cell level--unusual weakness or fatigue, dizziness, headache, trouble breathing Pain, tingling, or numbness in the hands or feet, muscle weakness, change in vision, confusion or trouble speaking, loss of balance or coordination, trouble walking, seizures Unusual bruising or bleeding Side effects that usually do not require medical attention (report to your care team if they continue or are bothersome): Hair loss Nausea Unusual weakness or fatigue Vomiting This list may not describe all possible side effects. Call your doctor for medical advice about side effects. You may report side effects to FDA at 1-800-FDA-1088. Where should I keep my medication? This medication is given in a hospital or clinic. It will not be stored at home. NOTE: This sheet is a summary. It may not cover all possible information. If you have questions about this medicine, talk to your doctor, pharmacist, or health care provider.  2024 Elsevier/Gold Standard (2021-05-05 00:00:00)

## 2023-06-21 NOTE — Assessment & Plan Note (Signed)
 Continue antiemetics as needed per instruction.

## 2023-06-22 ENCOUNTER — Ambulatory Visit
Admission: RE | Admit: 2023-06-22 | Discharge: 2023-06-22 | Disposition: A | Discharge status: Home / Self Care | Source: Ambulatory Visit | Attending: Radiation Oncology | Admitting: Radiation Oncology

## 2023-06-22 ENCOUNTER — Other Ambulatory Visit: Payer: Self-pay

## 2023-06-22 DIAGNOSIS — C3411 Malignant neoplasm of upper lobe, right bronchus or lung: Secondary | ICD-10-CM | POA: Diagnosis not present

## 2023-06-22 LAB — RAD ONC ARIA SESSION SUMMARY
Course Elapsed Days: 37
Plan Fractions Treated to Date: 27
Plan Prescribed Dose Per Fraction: 2 Gy
Plan Total Fractions Prescribed: 33
Plan Total Prescribed Dose: 66 Gy
Reference Point Dosage Given to Date: 54 Gy
Reference Point Session Dosage Given: 2 Gy
Session Number: 27

## 2023-06-23 ENCOUNTER — Ambulatory Visit
Admission: RE | Admit: 2023-06-23 | Discharge: 2023-06-23 | Disposition: A | Discharge status: Home / Self Care | Source: Ambulatory Visit | Attending: Radiation Oncology | Admitting: Radiation Oncology

## 2023-06-23 ENCOUNTER — Other Ambulatory Visit: Payer: Self-pay

## 2023-06-23 DIAGNOSIS — C3411 Malignant neoplasm of upper lobe, right bronchus or lung: Secondary | ICD-10-CM | POA: Diagnosis not present

## 2023-06-23 LAB — RAD ONC ARIA SESSION SUMMARY
Course Elapsed Days: 38
Plan Fractions Treated to Date: 28
Plan Prescribed Dose Per Fraction: 2 Gy
Plan Total Fractions Prescribed: 33
Plan Total Prescribed Dose: 66 Gy
Reference Point Dosage Given to Date: 56 Gy
Reference Point Session Dosage Given: 2 Gy
Session Number: 28

## 2023-06-24 ENCOUNTER — Ambulatory Visit
Admission: RE | Admit: 2023-06-24 | Discharge: 2023-06-24 | Disposition: A | Discharge status: Home / Self Care | Source: Ambulatory Visit | Attending: Radiation Oncology | Admitting: Radiation Oncology

## 2023-06-24 ENCOUNTER — Other Ambulatory Visit: Payer: Self-pay

## 2023-06-24 DIAGNOSIS — C3432 Malignant neoplasm of lower lobe, left bronchus or lung: Secondary | ICD-10-CM

## 2023-06-24 DIAGNOSIS — C3411 Malignant neoplasm of upper lobe, right bronchus or lung: Secondary | ICD-10-CM | POA: Diagnosis not present

## 2023-06-24 LAB — RAD ONC ARIA SESSION SUMMARY
Course Elapsed Days: 39
Plan Fractions Treated to Date: 29
Plan Prescribed Dose Per Fraction: 2 Gy
Plan Total Fractions Prescribed: 33
Plan Total Prescribed Dose: 66 Gy
Reference Point Dosage Given to Date: 58 Gy
Reference Point Session Dosage Given: 2 Gy
Session Number: 29

## 2023-06-27 ENCOUNTER — Other Ambulatory Visit: Payer: Self-pay

## 2023-06-27 ENCOUNTER — Inpatient Hospital Stay

## 2023-06-27 ENCOUNTER — Inpatient Hospital Stay: Attending: Oncology | Admitting: Oncology

## 2023-06-27 ENCOUNTER — Encounter: Payer: Self-pay | Admitting: Oncology

## 2023-06-27 ENCOUNTER — Ambulatory Visit

## 2023-06-27 ENCOUNTER — Other Ambulatory Visit

## 2023-06-27 ENCOUNTER — Ambulatory Visit
Admission: RE | Admit: 2023-06-27 | Discharge: 2023-06-27 | Disposition: A | Discharge status: Home / Self Care | Source: Ambulatory Visit | Attending: Radiation Oncology | Admitting: Radiation Oncology

## 2023-06-27 ENCOUNTER — Ambulatory Visit: Admitting: Oncology

## 2023-06-27 VITALS — BP 136/61

## 2023-06-27 VITALS — BP 129/64 | HR 67 | Temp 97.1°F | Resp 20 | Wt 124.6 lb

## 2023-06-27 DIAGNOSIS — I6529 Occlusion and stenosis of unspecified carotid artery: Secondary | ICD-10-CM | POA: Insufficient documentation

## 2023-06-27 DIAGNOSIS — Z5111 Encounter for antineoplastic chemotherapy: Secondary | ICD-10-CM | POA: Insufficient documentation

## 2023-06-27 DIAGNOSIS — C3411 Malignant neoplasm of upper lobe, right bronchus or lung: Secondary | ICD-10-CM | POA: Insufficient documentation

## 2023-06-27 DIAGNOSIS — E785 Hyperlipidemia, unspecified: Secondary | ICD-10-CM | POA: Diagnosis not present

## 2023-06-27 DIAGNOSIS — I10 Essential (primary) hypertension: Secondary | ICD-10-CM | POA: Diagnosis not present

## 2023-06-27 DIAGNOSIS — Z79899 Other long term (current) drug therapy: Secondary | ICD-10-CM | POA: Insufficient documentation

## 2023-06-27 DIAGNOSIS — E7800 Pure hypercholesterolemia, unspecified: Secondary | ICD-10-CM | POA: Insufficient documentation

## 2023-06-27 DIAGNOSIS — K219 Gastro-esophageal reflux disease without esophagitis: Secondary | ICD-10-CM | POA: Insufficient documentation

## 2023-06-27 DIAGNOSIS — Z85118 Personal history of other malignant neoplasm of bronchus and lung: Secondary | ICD-10-CM | POA: Insufficient documentation

## 2023-06-27 DIAGNOSIS — R110 Nausea: Secondary | ICD-10-CM | POA: Diagnosis not present

## 2023-06-27 DIAGNOSIS — Z860100 Personal history of colon polyps, unspecified: Secondary | ICD-10-CM | POA: Insufficient documentation

## 2023-06-27 DIAGNOSIS — C3432 Malignant neoplasm of lower lobe, left bronchus or lung: Secondary | ICD-10-CM | POA: Insufficient documentation

## 2023-06-27 DIAGNOSIS — J439 Emphysema, unspecified: Secondary | ICD-10-CM | POA: Diagnosis not present

## 2023-06-27 DIAGNOSIS — E782 Mixed hyperlipidemia: Secondary | ICD-10-CM | POA: Insufficient documentation

## 2023-06-27 DIAGNOSIS — K7689 Other specified diseases of liver: Secondary | ICD-10-CM | POA: Insufficient documentation

## 2023-06-27 DIAGNOSIS — T451X5A Adverse effect of antineoplastic and immunosuppressive drugs, initial encounter: Secondary | ICD-10-CM | POA: Diagnosis not present

## 2023-06-27 DIAGNOSIS — Z87891 Personal history of nicotine dependence: Secondary | ICD-10-CM | POA: Insufficient documentation

## 2023-06-27 DIAGNOSIS — K449 Diaphragmatic hernia without obstruction or gangrene: Secondary | ICD-10-CM | POA: Insufficient documentation

## 2023-06-27 DIAGNOSIS — N1330 Unspecified hydronephrosis: Secondary | ICD-10-CM | POA: Insufficient documentation

## 2023-06-27 DIAGNOSIS — Z510 Encounter for antineoplastic radiation therapy: Secondary | ICD-10-CM | POA: Diagnosis not present

## 2023-06-27 DIAGNOSIS — K208 Other esophagitis without bleeding: Secondary | ICD-10-CM | POA: Diagnosis not present

## 2023-06-27 DIAGNOSIS — E079 Disorder of thyroid, unspecified: Secondary | ICD-10-CM | POA: Insufficient documentation

## 2023-06-27 DIAGNOSIS — K5732 Diverticulitis of large intestine without perforation or abscess without bleeding: Secondary | ICD-10-CM | POA: Diagnosis not present

## 2023-06-27 DIAGNOSIS — I700 Atherosclerosis of aorta: Secondary | ICD-10-CM | POA: Insufficient documentation

## 2023-06-27 LAB — CMP (CANCER CENTER ONLY)
ALT: 17 U/L (ref 0–44)
AST: 19 U/L (ref 15–41)
Albumin: 3.6 g/dL (ref 3.5–5.0)
Alkaline Phosphatase: 51 U/L (ref 38–126)
Anion gap: 9 (ref 5–15)
BUN: 25 mg/dL — ABNORMAL HIGH (ref 8–23)
CO2: 24 mmol/L (ref 22–32)
Calcium: 8.9 mg/dL (ref 8.9–10.3)
Chloride: 106 mmol/L (ref 98–111)
Creatinine: 0.59 mg/dL (ref 0.44–1.00)
GFR, Estimated: >60 mL/min (ref >60)
Glucose, Bld: 92 mg/dL (ref 70–99)
Potassium: 4.1 mmol/L (ref 3.5–5.1)
Sodium: 139 mmol/L (ref 135–145)
Total Bilirubin: 0.4 mg/dL (ref 0.0–1.2)
Total Protein: 6.5 g/dL (ref 6.5–8.1)

## 2023-06-27 LAB — CBC WITH DIFFERENTIAL (CANCER CENTER ONLY)
Abs Immature Granulocytes: 0.01 10*3/uL (ref 0.00–0.07)
Basophils Absolute: 0 10*3/uL (ref 0.0–0.1)
Basophils Relative: 1 %
Eosinophils Absolute: 0 10*3/uL (ref 0.0–0.5)
Eosinophils Relative: 1 %
HCT: 40.1 % (ref 36.0–46.0)
Hemoglobin: 13.4 g/dL (ref 12.0–15.0)
Immature Granulocytes: 0 %
Lymphocytes Relative: 26 %
Lymphs Abs: 0.6 10*3/uL — ABNORMAL LOW (ref 0.7–4.0)
MCH: 33.6 pg (ref 26.0–34.0)
MCHC: 33.4 g/dL (ref 30.0–36.0)
MCV: 100.5 fL — ABNORMAL HIGH (ref 80.0–100.0)
Monocytes Absolute: 0.2 10*3/uL (ref 0.1–1.0)
Monocytes Relative: 10 %
Neutro Abs: 1.5 10*3/uL — ABNORMAL LOW (ref 1.7–7.7)
Neutrophils Relative %: 62 %
Platelet Count: 162 10*3/uL (ref 150–400)
RBC: 3.99 MIL/uL (ref 3.87–5.11)
RDW: 13.5 % (ref 11.5–15.5)
WBC Count: 2.4 10*3/uL — ABNORMAL LOW (ref 4.0–10.5)
nRBC: 0 % (ref 0.0–0.2)

## 2023-06-27 LAB — RAD ONC ARIA SESSION SUMMARY
Course Elapsed Days: 42
Plan Fractions Treated to Date: 30
Plan Prescribed Dose Per Fraction: 2 Gy
Plan Total Fractions Prescribed: 33
Plan Total Prescribed Dose: 66 Gy
Reference Point Dosage Given to Date: 60 Gy
Reference Point Session Dosage Given: 2 Gy
Session Number: 30

## 2023-06-27 MED ORDER — DEXAMETHASONE SODIUM PHOSPHATE 10 MG/ML IJ SOLN
10.0000 mg | Freq: Once | INTRAMUSCULAR | Status: AC
  Administered 2023-06-27: 10 mg via INTRAVENOUS
  Filled 2023-06-27: qty 1

## 2023-06-27 MED ORDER — SODIUM CHLORIDE 0.9 % IV SOLN
INTRAVENOUS | Status: DC
  Filled 2023-06-27: qty 250

## 2023-06-27 MED ORDER — SODIUM CHLORIDE 0.9 % IV SOLN
50.0000 mg/m2 | Freq: Once | INTRAVENOUS | Status: AC
  Administered 2023-06-27: 78 mg via INTRAVENOUS
  Filled 2023-06-27: qty 13

## 2023-06-27 MED ORDER — SODIUM CHLORIDE 0.9 % IV SOLN
125.6000 mg | Freq: Once | INTRAVENOUS | Status: AC
  Administered 2023-06-27: 130 mg via INTRAVENOUS
  Filled 2023-06-27: qty 13

## 2023-06-27 MED ORDER — FAMOTIDINE IN NACL 20-0.9 MG/50ML-% IV SOLN
20.0000 mg | Freq: Once | INTRAVENOUS | Status: AC
  Administered 2023-06-27: 20 mg via INTRAVENOUS
  Filled 2023-06-27: qty 50

## 2023-06-27 MED ORDER — PALONOSETRON HCL INJECTION 0.25 MG/5ML
0.2500 mg | Freq: Once | INTRAVENOUS | Status: AC
  Administered 2023-06-27: 0.25 mg via INTRAVENOUS
  Filled 2023-06-27: qty 5

## 2023-06-27 MED ORDER — DIPHENHYDRAMINE HCL 50 MG/ML IJ SOLN
25.0000 mg | Freq: Once | INTRAMUSCULAR | Status: AC
  Administered 2023-06-27: 25 mg via INTRAVENOUS
  Filled 2023-06-27: qty 1

## 2023-06-27 NOTE — Assessment & Plan Note (Addendum)
 Discussed with urology.  Hydronephrosis is worse and urology office will call her to make a follow up appt.

## 2023-06-27 NOTE — Assessment & Plan Note (Signed)
#  History of stage Ib right lung upper lobe adenocarcinoma status post wedge resection in 2016 History of left lower lobe adenocarcinoma T1 a NX status post wedge resection 2019. Recurrence - Biopsy of pretracheal lymph node positive for NSCLC.  MRI brain is negative.  Recommend concurrent chemotherapy [carboplatin  taxol ]  with radiation.  Labs are reviewed and discussed with patient. Proceed with carboplatin  AUC2, taxol  45mg /m2.   Discussed with patient about immunotherapy consolidation with Durvalumab Q2weeks for up to a year. Rationale and side effects were reviewed with patient.  She is interested.

## 2023-06-27 NOTE — Assessment & Plan Note (Signed)
Same plan as above.  

## 2023-06-27 NOTE — Assessment & Plan Note (Signed)
 Continue antiemetics as needed per instruction.

## 2023-06-27 NOTE — Progress Notes (Signed)
 Hematology/Oncology Progress note [phone] [phone]) [phone]     Chief Complaint:  Follow up for recurrent non small cell lung cancer   ASSESSMENT & PLAN:  Heather [name] is a 88 y.o. Heather [name] female with stage IB adenocarcinoma of the right upper lobe status post wedge resection on 03/13/2014. T2aN1M0 (stage IB).  She is s/p wedge resection of a left lower lobe mass on 03/10/2017. T1aNx (stage IA). No with locally recurrent NSCLC   Malignant neoplasm of lower lobe of left lung (HCC) #History of stage Ib right lung upper lobe adenocarcinoma status post wedge resection in 2016 History of left lower lobe adenocarcinoma T1 a NX status post wedge resection 2019. Recurrence - Biopsy of pretracheal lymph node positive for NSCLC.  MRI brain is negative.  Recommend concurrent chemotherapy [carboplatin  taxol ]  with radiation.  Labs are reviewed and discussed with patient. Proceed with carboplatin  AUC2, taxol  45mg /m2.   Discussed with patient about immunotherapy consolidation with Durvalumab Q2weeks for up to a year. Rationale and side effects were reviewed with patient.  She is interested.      Chemotherapy-induced nausea Continue antiemetics as needed per instruction.  Encounter for antineoplastic chemotherapy Chemotherapy plan as listed above.   Primary cancer of right upper lobe of lung [facility]) Same plan as above.   Hydronephrosis of left kidney Discussed with urology.  Hydronephrosis is worse and urology office will call her to make a follow up appt.    Orders Placed This Encounter  Procedures   CT Chest Wo Contrast    Standing Status:   Future    Expected Date:   07/27/2023    Expiration Date:   06/26/2024    Preferred imaging location?:   [facility]   CBC with Differential (Cancer Center Only)    Standing Status:   Future    Number of Occurrences:   1    Expected Date:   06/27/2023    Expiration Date:   06/26/2024   CMP (Cancer Center only)    Standing Status:    Future    Number of Occurrences:   1    Expected Date:   06/27/2023    Expiration Date:   06/26/2024   Follow up per LOS  All questions were answered. The patient knows to call the clinic with any problems, questions or concerns.  We spent sufficient time to discuss many aspect of care, questions were answered to patient's satisfaction.   Timmy Forbes, MD, PhD [facility] Hematology Oncology 06/27/2023   PERTINENT ONCOLOGY HISTORY Previously follows up with Dr.Corcoran. Estasblish care with me on 03/15/2018  PET scan on 02/13/2014 revealed a 1 cm right upper lobe hypermetabolic nodule (SUV 3.8) and a 6 mm right upper lobe nodule (no metabolic activity). There was no mediastianl adenopathy.  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 Pathology revealed a 1.1 cm high-grade adenocarcinoma with pleural invasion. Nodes were negative. Pathologic stage was T2aN1M0 (stage IB).  Chest CT on 02/10/2017 revealed clear interval progression of the posterior left lower lobe pulmonary nodule, now measuring 12 mm in long axis and having a distinctly lobular contour. Imaging features were very concerning for neoplasm.  There was no change in the 7 mm nodule posterior to the right hilum.  PET scan on 02/18/2017 revealed a 11 x 8 mm posterior left lower lobe pulmonary nodule that had mildly progressed from prior studies and demonstrated mild hypermetabolism (SUV 1.6).  The appearance was considered worrisome for  an indolent primary bronchogenic neoplasm.  03/10/2017.  s/p wedge resection of a left lower lobe mass.   Pathology revealed an 8 mm invasive adenocarcinoma with solid and acinar patterns.  There was no visceral invasion.  There was no lymphovascular invasion.  All margins were negative.  Pathologic stage was T1aNx (stage IA).   INTERVAL HISTORY Heather [name] is a 88 y.o. female who has above history reviewed by me today presents for follow up visit for  management of recurrent lung cancer, .  Patient reports feeling well.  She was accompanied by her husband Denies shortness of breath, fatigue, hemoptysis, unintentional weight loss. No new complaints. Denies nausea vomiting, diarrhea, dysuria, flank pain or fever.    Past Medical History:  Diagnosis Date   Anginal pain (HCC)    Benign essential hypertension    Brain aneurysm    Carotid artery stenosis    Carotid stenosis 06/19/2013   Colonic polyp    Emphysema lung (HCC)    GERD (gastroesophageal reflux disease)    Hiatal hernia    Hypercholesteremia    Hyperlipidemia, mixed 06/09/2015   Lung cancer (HCC) 2016   Lung nodule 06/21/2014   Macrocytic 10/06/2014   Multiple thyroid  nodules    PAT (paroxysmal atrial tachycardia) (HCC) 06/19/2013   Plantar fasciitis     Past Surgical History:  Procedure Laterality Date   BREAST EXCISIONAL BIOPSY Right 40 yrs ago   neg   BREAST LUMPECTOMY     benign   CATARACT EXTRACTION W/ INTRAOCULAR LENS  IMPLANT, BILATERAL Bilateral    CEREBRAL ANEURYSM REPAIR  2004   coil treatment   CERVIX LESION DESTRUCTION     COLONOSCOPY     1995, 2006, 2014   COLONOSCOPY WITH PROPOFOL  N/A 03/31/2015   Procedure: COLONOSCOPY WITH PROPOFOL ;  Surgeon: Cassie Click, MD;  [facility] ENDOSCOPY;  Service: Endoscopy;  Laterality: N/A;   CYSTOSCOPY W/ RETROGRADES Left 03/02/2022   Procedure: CYSTOSCOPY WITH RETROGRADE PYELOGRAM;  Surgeon: Geraline Knapp, MD;  Location: [facility] ORS;  Service: Urology;  Laterality: Left;   CYSTOSCOPY W/ RETROGRADES Left 12/28/2022   Procedure: CYSTOSCOPY WITH RETROGRADE PYELOGRAM;  Surgeon: Geraline Knapp, MD;  Location: [facility] ORS;  Service: Urology;  Laterality: Left;   CYSTOSCOPY W/ URETERAL STENT PLACEMENT Left 03/03/2021   Procedure: CYSTOSCOPY WITH EXCHANGE;  Surgeon: Geraline Knapp, MD;  Location: [facility] ORS;  Service: Urology;  Laterality: Left;   CYSTOSCOPY W/ URETERAL STENT PLACEMENT Left 03/02/2022    Procedure: CYSTOSCOPY WITH STENT EXCHANGE;  Surgeon: Geraline Knapp, MD;  Location: [facility] ORS;  Service: Urology;  Laterality: Left;   CYSTOSCOPY W/ URETERAL STENT PLACEMENT Left 12/28/2022   Procedure: CYSTOSCOPY WITH STENT EXCHANGE;  Surgeon: Geraline Knapp, MD;  Location: [facility] ORS;  Service: Urology;  Laterality: Left;   CYSTOSCOPY WITH STENT PLACEMENT Left 11/25/2020   Procedure: CYSTOSCOPY WITH STENT PLACEMENT;  Surgeon: Geraline Knapp, MD;  Location: [facility] ORS;  Service: Urology;  Laterality: Left;   ENDOBRONCHIAL ULTRASOUND Bilateral 04/19/2023   Procedure: ENDOBRONCHIAL ULTRASOUND (EBUS);  Surgeon: Vergia Glasgow, MD;  Location: [facility] ORS;  Service: Pulmonary;  Laterality: Bilateral;   FLEXIBLE BRONCHOSCOPY N/A 03/10/2017   Procedure: FLEXIBLE BRONCHOSCOPY;  Surgeon: Petra Brandy, MD;  Location: [facility] ORS;  Service: Thoracic;  Laterality: N/A;   THORACOSCOPY WITH WEDGE RESECTION LUNG Right 03/13/2014   RUL   THORACOTOMY/LOBECTOMY Left 03/10/2017   Procedure: THORACOTOMY WITH LUNG WEDGE RESECTION POSSIBLE LOBECTOMY;  Surgeon: Petra Brandy, MD;  Location: [facility] ORS;  Service: Thoracic;  Laterality: Left;   TUBAL LIGATION      Family History  Problem Relation Age of Onset   Alcohol abuse Mother    Heart attack Father    Breast cancer Neg Hx     Social History:  reports that she quit smoking about 35 years ago. Her smoking use included cigarettes. She started smoking about 70 years ago. She has a 35 pack-year smoking history. She has never used smokeless tobacco. She reports current alcohol use. She reports that she does not use drugs.   Allergies:  Allergies  Allergen Reactions   Ace Inhibitors Swelling   Contrast Media [Iodinated Contrast Media]    Cortisone     Patient had facial swelling and itching from cortisone injection IM    Current Medications: Current Outpatient Medications  Medication Sig Dispense Refill   atenolol  (TENORMIN ) 50 MG tablet Take 50 mg by mouth at  bedtime.     calcium -vitamin D  (OSCAL WITH D) 500-200 MG-UNIT tablet Take 1 tablet by mouth daily with breakfast.     cetirizine (ZYRTEC) 10 MG tablet Take 10 mg by mouth daily.     dextromethorphan-guaiFENesin  (MUCINEX  DM) 30-600 MG 12hr tablet Take 1 tablet by mouth 2 (two) times daily as needed for cough.     fluticasone  (FLONASE ) 50 MCG/ACT nasal spray Place 1 spray into the nose daily as needed for allergies.      simvastatin  (ZOCOR ) 20 MG tablet Take 20 mg by mouth every morning.     vitamin B-12 (CYANOCOBALAMIN ) 100 MCG tablet Take 100 mcg by mouth daily.     amLODipine  (NORVASC ) 5 MG tablet Take 5 mg by mouth at bedtime. (Patient not taking: Reported on 06/27/2023)     donepezil  (ARICEPT ) 10 MG tablet Take 1 tablet by mouth at bedtime. (Patient not taking: Reported on 06/27/2023)     ondansetron  (ZOFRAN ) 8 MG tablet Take 1 tablet (8 mg total) by mouth every 8 (eight) hours as needed for nausea or vomiting. Start on the third day after chemotherapy. (Patient not taking: Reported on 06/27/2023) 30 tablet 1   No current facility-administered medications for this visit.   Facility-Administered Medications Ordered in Other Visits  Medication Dose Route Frequency Provider Last Rate Last Admin   0.9 %  sodium chloride  infusion   Intravenous Continuous Timmy Forbes, MD 10 mL/hr at 06/27/23 0946 New Bag at 06/27/23 0946   CARBOplatin  (PARAPLATIN ) 130 mg in sodium chloride  0.9 % 100 mL chemo infusion  130 mg Intravenous Once Lang Zingg, MD       famotidine  (PEPCID ) IVPB 20 mg premix  20 mg Intravenous Once Tehran Rabenold, MD 200 mL/hr at 06/27/23 0959 20 mg at 06/27/23 0959   PACLitaxel  (TAXOL ) 78 mg in sodium chloride  0.9 % 250 mL chemo infusion (</= 80mg /m2)  50 mg/m2 (Treatment Plan Recorded) Intravenous Once Timmy Forbes, MD       Review of Systems  Constitutional:  Negative for appetite change, chills, fatigue and fever.  HENT:   Positive for hearing loss. Negative for voice change.   Eyes:  Negative for eye  problems.  Respiratory:  Negative for chest tightness and cough.   Cardiovascular:  Negative for chest pain.  Gastrointestinal:  Negative for abdominal distention, abdominal pain, blood in stool and nausea.  Endocrine: Negative for hot flashes.  Genitourinary:  Negative for difficulty urinating and frequency.   Musculoskeletal:  Negative for arthralgias.       Chronic intermittent back pain  Skin:  Negative for itching and rash.  Neurological:  Negative for extremity weakness.  Hematological:  Negative for adenopathy.  Psychiatric/Behavioral:  Negative for confusion.     Performance status (ECOG): 0 - Asymptomatic  Vital Signs BP 129/64   Pulse 67   Temp (!) 97.1 F (36.2 C)   Resp 20   Wt 124 lb 9.6 oz (56.5 kg)   SpO2 100%   BMI 22.79 kg/m   Physical Exam Constitutional:      General: She is not in acute distress.    Appearance: She is not diaphoretic.  HENT:     Head: Normocephalic and atraumatic.  Eyes:     General: No scleral icterus. Cardiovascular:     Rate and Rhythm: Normal rate and regular rhythm.  Pulmonary:     Effort: Pulmonary effort is normal. No respiratory distress.  Abdominal:     General: There is no distension.     Palpations: Abdomen is soft.     Tenderness: There is no abdominal tenderness.  Musculoskeletal:        General: Normal range of motion.     Cervical back: Normal range of motion and neck supple.  Skin:    General: Skin is dry.     Findings: No erythema.  Neurological:     Mental Status: She is alert and oriented to person, place, and time. Mental status is at baseline.  Psychiatric:        Mood and Affect: Mood and affect normal.     RADIOGRAPHIC STUDIES: I have personally reviewed the radiological images as listed and agreed with the findings in the report. CT ABDOMEN PELVIS WO CONTRAST Result Date: 05/28/2023 CLINICAL DATA:  Lower abdominal pain and diarrhea. Near syncopal episodes. On chemo and radiation for lung carcinoma.  EXAM: CT ABDOMEN AND PELVIS WITHOUT CONTRAST TECHNIQUE: Multidetector CT imaging of the abdomen and pelvis was performed following the standard protocol without IV contrast. RADIATION DOSE REDUCTION: This exam was performed according to the departmental dose-optimization program which includes automated exposure control, adjustment of the mA and/or kV according to patient size and/or use of iterative reconstruction technique. COMPARISON:  CT abdomen pelvis 11/25/2020 and PET/CT 03/30/2023 FINDINGS: Lower chest: Postsurgical changes left lower lobe. No acute abnormality. Hepatobiliary: Unchanged hepatic cysts in the left hepatic lobe. Hypoattenuating in the posterior right hepatic lobe on series 2/image 24 measures 6 mm and is too small to definitively characterize but appear similar to 11/25/2020. Unremarkable gallbladder and biliary tree. Pancreas: Unremarkable. Spleen: Unremarkable. Adrenals/Urinary Tract: Normal adrenal glands. Marked left hydronephrosis has increased compared to 03/30/2023. The left ureteral stent is unchanged in position compared to recent PET/CT with the proximal loop in the distal portion of the dilated left renal pelvis. Stable right kidney. Unremarkable bladder. Stomach/Bowel: Small hiatal hernia. No bowel obstruction. Colonic diverticulosis. Wall thickening about the sigmoid colon with mild adjacent stranding. Normal appendix. Vascular/Lymphatic: Aortic atherosclerosis. No enlarged abdominal or pelvic lymph nodes. Reproductive: No acute abnormality. Other: Stranding about the sigmoid colon. No organized fluid collection or abscess. No free intraperitoneal air. Musculoskeletal: No acute fracture or destructive osseous lesion. IMPRESSION: 1. Acute uncomplicated sigmoid diverticulitis. Consider follow-up colonoscopy following resolution of patient's acute symptoms to exclude underlying mass. 2. Marked left hydronephrosis has increased compared to 03/30/2023. The left ureteral stent is  unchanged in position compared to recent PET/CT with the proximal loop in the distal portion of the dilated left renal pelvis. Query stent dysfunction. 3. Aortic Atherosclerosis (ICD10-I70.0). Electronically Signed   By: Herminia Lope  Stutzman M.D.   On: 05/28/2023 03:00   MR Brain W Wo Contrast Result Date: 04/27/2023 CLINICAL DATA:  Lung cancer.  Staging. EXAM: MRI HEAD WITHOUT AND WITH CONTRAST TECHNIQUE: Multiplanar, multiecho pulse sequences of the brain and surrounding structures were obtained without and with intravenous contrast. CONTRAST:  5mL GADAVIST  GADOBUTROL  1 MMOL/ML IV SOLN COMPARISON:  07/15/2015 FINDINGS: Brain: Diffusion imaging does not show any acute or subacute infarction. Brainstem and cerebellum are normal. Cerebral hemispheres show minimal small vessel change of the white matter, less than often seen at this age. No cortical or large vessel territory infarction. No mass lesion, hemorrhage, hydrocephalus or extra-axial collection. Vascular: Major vessels at the base of the brain show flow. Persistent trigeminal artery on the left as seen on previous imaging. Redemonstration of a small aneurysm of the left supraclinoid ICA, not studied in detail. Skull and upper cervical spine: Negative Sinuses/Orbits: Clear/normal Other: None IMPRESSION: 1. No evidence of metastatic disease. Minimal small vessel change of the cerebral hemispheric white matter, less than often seen at this age. 2. Persistent trigeminal artery on the left as seen on previous imaging. Redemonstration of a small aneurysm of the left supraclinoid ICA, not studied in detail. Electronically Signed   By: Bettylou Brunner M.D.   On: 04/27/2023 11:17   DG Chest Port 1 View Result Date: 04/19/2023 CLINICAL DATA:  Status post bronchoscopy. EXAM: PORTABLE CHEST 1 VIEW COMPARISON:  Chest radiograph dated 12/15/2022 and CT dated 03/18/2023. FINDINGS: No focal consolidation, pleural effusion, pneumothorax. Surgical suture in the left lower lung  field. The cardiac silhouette is within normal limits. Atherosclerotic calcification of the aortic arch. No acute osseous pathology. IMPRESSION: No active disease. No pneumothorax. Electronically Signed   By: Angus Bark M.D.   On: 04/19/2023 12:38   NM PET Image Initial (PI) Skull Base To Thigh Result Date: 04/05/2023 CLINICAL DATA:  Subsequent treatment strategy for lung carcinoma. Abnormal CT with enlarged pretracheal lymph CT. EXAM: NUCLEAR MEDICINE PET SKULL BASE TO THIGH TECHNIQUE: mCi F-18 FDG was injected intravenously. Full-ring PET imaging was performed from the skull base to thigh after the radiotracer. CT data was obtained and used for attenuation correction and anatomic localization. Fasting blood glucose:  mg/dl COMPARISON:  None Available. FINDINGS: NECK: No hypermetabolic lymph nodes in the neck.  Is Incidental CT findings: None. CHEST: Postsurgical change in the RIGHT upper lobe. No hypermetabolic pulmonary nodules. The enlarged pretracheal nodule of concern measures 11 mm short axis (image 43/6) and has intense metabolic activity with SUV max equal 10.1. No additional hypermetabolic mediastinal hilar nodes. No supraclavicular adenopathy. Incidental CT findings: Small nodule along the LEFT oblique fissure measuring 3 mm (image 56/6) has metabolic activity (SUV max equal 2.8) . This nodule was present on CT 03/16/2022 and similar size. Nodule also present on CT from 03/16/2021 and same size. ABDOMEN/PELVIS: No abnormal hypermetabolic activity within the liver, pancreas, adrenal glands, or spleen. No hypermetabolic lymph nodes in the abdomen or pelvis. Incidental CT findings: LEFT hydronephrosis. Double-J ureteral stent on the LEFT. The proximal stent is in the dilated LEFT renal pelvis. There is evidence of urine excretion along the course of the LEFT ureter. SKELETON: No focal hypermetabolic activity to suggest skeletal metastasis. Incidental CT findings: None. IMPRESSION: 1. Hypermetabolic  pretracheal lymph node is concerning for metastatic adenopathy. 2. No evidence of local recurrence in the RIGHT upper lobe. 3. No evidence of metastatic disease in the abdomen pelvis. 4. Small nodule along the LEFT oblique fissure has metabolic activity;  however, nodule has been stable in size since 03/16/2021. Favor benign nodule. 5. LEFT hydronephrosis with double-J ureteral stent in place. Electronically Signed   By: Deboraha Fallow M.D.   On: 04/05/2023 11:35    Laboratory results were reviewed by me    Latest Ref Rng & Units 06/27/2023    8:35 AM 06/21/2023   12:55 PM 06/13/2023   12:39 PM  CBC  WBC 4.0 - 10.5 K/uL 2.4  3.2  5.2   Hemoglobin 12.0 - 15.0 g/dL 40.9  81.1  91.4   Hematocrit 36.0 - 46.0 % 40.1  39.7  41.9   Platelets 150 - 400 K/uL 162  115  151       Latest Ref Rng & Units 06/27/2023    8:35 AM 06/21/2023   12:54 PM 06/13/2023   12:40 PM  CMP  Glucose 70 - 99 mg/dL 92  782  90   BUN 8 - 23 mg/dL 25  28  21    Creatinine 0.44 - 1.00 mg/dL 9.56  2.13  0.86   Sodium 135 - 145 mmol/L 139  138  140   Potassium 3.5 - 5.1 mmol/L 4.1  4.2  4.1   Chloride 98 - 111 mmol/L 106  106  105   CO2 22 - 32 mmol/L 24  23  25    Calcium  8.9 - 10.3 mg/dL 8.9  8.7  8.9   Total Protein 6.5 - 8.1 g/dL 6.5  6.0  6.5   Total Bilirubin 0.0 - 1.2 mg/dL 0.4  0.5  0.6   Alkaline Phos 38 - 126 U/L 51  50  50   AST 15 - 41 U/L 19  23  23    ALT 0 - 44 U/L 17  21  24

## 2023-06-27 NOTE — Assessment & Plan Note (Signed)
 Chemotherapy plan as listed above

## 2023-06-27 NOTE — Patient Instructions (Signed)
 [facility] - A DEPT OF [facility]  Discharge Instructions: Thank you for choosing [facility] to provide your oncology and hematology care.  If you have a lab appointment with the Cancer Center, please go directly to the Cancer Center and check in at the registration area.  Wear comfortable clothing and clothing appropriate for easy access to any Portacath or PICC line.   We strive to give you quality time with your provider. You may need to reschedule your appointment if you arrive late (15 or more minutes).  Arriving late affects you and other patients whose appointments are after yours.  Also, if you miss three or more appointments without notifying the office, you may be dismissed from the clinic at the provider's discretion.      For prescription refill requests, have your pharmacy contact our office and allow 72 hours for refills to be completed.    Today you received the following chemotherapy and/or immunotherapy agents TAXOL and CARBOPLATIN       To help prevent nausea and vomiting after your treatment, we encourage you to take your nausea medication as directed.  BELOW ARE SYMPTOMS THAT SHOULD BE REPORTED IMMEDIATELY: *FEVER GREATER THAN 100.4 F (38 C) OR HIGHER *CHILLS OR SWEATING *NAUSEA AND VOMITING THAT IS NOT CONTROLLED WITH YOUR NAUSEA MEDICATION *UNUSUAL SHORTNESS OF BREATH *UNUSUAL BRUISING OR BLEEDING *URINARY PROBLEMS (pain or burning when urinating, or frequent urination) *BOWEL PROBLEMS (unusual diarrhea, constipation, pain near the anus) TENDERNESS IN MOUTH AND THROAT WITH OR WITHOUT PRESENCE OF ULCERS (sore throat, sores in mouth, or a toothache) UNUSUAL RASH, SWELLING OR PAIN  UNUSUAL VAGINAL DISCHARGE OR ITCHING   Items with * indicate a potential emergency and should be followed up as soon as possible or go to the Emergency Department if any problems should occur.  Please show the CHEMOTHERAPY ALERT CARD or  IMMUNOTHERAPY ALERT CARD at check-in to the Emergency Department and triage nurse.  Should you have questions after your visit or need to cancel or reschedule your appointment, please contact [facility] - A DEPT OF [facility]  [phone] and follow the prompts.  Office hours are 8:00 a.m. to 4:30 p.m. Monday - Friday. Please note that voicemails left after 4:00 p.m. may not be returned until the following business day.  We are closed weekends and major holidays. You have access to a nurse at all times for urgent questions. Please call the main number to the clinic [phone] and follow the prompts.  For any non-urgent questions, you may also contact your provider using MyChart. We now offer e-Visits for anyone 83 and older to request care online for non-urgent symptoms. For details visit [email or web address].   Also download the MyChart app! Go to the app store, search "MyChart", open the app, select [facility], and log in with your MyChart username and password.  Paclitaxel Injection What is this medication? PACLITAXEL (PAK li TAX el) treats some types of cancer. It works by slowing down the growth of cancer cells. This medicine may be used for other purposes; ask your health care provider or pharmacist if you have questions. COMMON BRAND NAME(S): Onxol, Taxol What should I tell my care team before I take this medication? They need to know if you have any of these conditions: Heart disease Liver disease Low white blood cell levels An unusual or allergic reaction to paclitaxel, other medications, foods, dyes, or preservatives  If you or your partner are pregnant or trying to get pregnant Breast-feeding How should I use this medication? This medication is injected into a vein. It is given by your care team in a hospital or clinic setting. Talk to your care team about the use of this medication in children. While it may be given to children for selected  conditions, precautions do apply. Overdosage: If you think you have taken too much of this medicine contact a poison control center or emergency room at once. NOTE: This medicine is only for you. Do not share this medicine with others. What if I miss a dose? Keep appointments for follow-up doses. It is important not to miss your dose. Call your care team if you are unable to keep an appointment. What may interact with this medication? Do not take this medication with any of the following: Live virus vaccines Other medications may affect the way this medication works. Talk with your care team about all of the medications you take. They may suggest changes to your treatment plan to lower the risk of side effects and to make sure your medications work as intended. This list may not describe all possible interactions. Give your health care provider a list of all the medicines, herbs, non-prescription drugs, or dietary supplements you use. Also tell them if you smoke, drink alcohol, or use illegal drugs. Some items may interact with your medicine. What should I watch for while using this medication? Your condition will be monitored carefully while you are receiving this medication. You may need blood work while taking this medication. This medication may make you feel generally unwell. This is not uncommon as chemotherapy can affect healthy cells as well as cancer cells. Report any side effects. Continue your course of treatment even though you feel ill unless your care team tells you to stop. This medication can cause serious allergic reactions. To reduce the risk, your care team may give you other medications to take before receiving this one. Be sure to follow the directions from your care team. This medication may increase your risk of getting an infection. Call your care team for advice if you get a fever, chills, sore throat, or other symptoms of a cold or flu. Do not treat yourself. Try to avoid  being around people who are sick. This medication may increase your risk to bruise or bleed. Call your care team if you notice any unusual bleeding. Be careful brushing or flossing your teeth or using a toothpick because you may get an infection or bleed more easily. If you have any dental work done, tell your dentist you are receiving this medication. Talk to your care team if you may be pregnant. Serious birth defects can occur if you take this medication during pregnancy. Talk to your care team before breastfeeding. Changes to your treatment plan may be needed. What side effects may I notice from receiving this medication? Side effects that you should report to your care team as soon as possible: Allergic reactions--skin rash, itching, hives, swelling of the face, lips, tongue, or throat Heart rhythm changes--fast or irregular heartbeat, dizziness, feeling faint or lightheaded, chest pain, trouble breathing Increase in blood pressure Infection--fever, chills, cough, sore throat, wounds that don't heal, pain or trouble when passing urine, general feeling of discomfort or being unwell Low blood pressure--dizziness, feeling faint or lightheaded, blurry vision Low red blood cell level--unusual weakness or fatigue, dizziness, headache, trouble breathing Painful swelling, warmth, or redness of the skin, blisters  or sores at the infusion site Pain, tingling, or numbness in the hands or feet Slow heartbeat--dizziness, feeling faint or lightheaded, confusion, trouble breathing, unusual weakness or fatigue Unusual bruising or bleeding Side effects that usually do not require medical attention (report to your care team if they continue or are bothersome): Diarrhea Hair loss Joint pain Loss of appetite Muscle pain Nausea Vomiting This list may not describe all possible side effects. Call your doctor for medical advice about side effects. You may report side effects to FDA at 1-800-FDA-1088. Where  should I keep my medication? This medication is given in a hospital or clinic. It will not be stored at home. NOTE: This sheet is a summary. It may not cover all possible information. If you have questions about this medicine, talk to your doctor, pharmacist, or health care provider.  2024 Elsevier/Gold Standard (2021-06-02 00:00:00)  Carboplatin Injection What is this medication? CARBOPLATIN (KAR boe pla tin) treats some types of cancer. It works by slowing down the growth of cancer cells. This medicine may be used for other purposes; ask your health care provider or pharmacist if you have questions. COMMON BRAND NAME(S): Paraplatin What should I tell my care team before I take this medication? They need to know if you have any of these conditions: Blood disorders Hearing problems Kidney disease Recent or ongoing radiation therapy An unusual or allergic reaction to carboplatin, cisplatin, other medications, foods, dyes, or preservatives Pregnant or trying to get pregnant Breast-feeding How should I use this medication? This medication is injected into a vein. It is given by your care team in a hospital or clinic setting. Talk to your care team about the use of this medication in children. Special care may be needed. Overdosage: If you think you have taken too much of this medicine contact a poison control center or emergency room at once. NOTE: This medicine is only for you. Do not share this medicine with others. What if I miss a dose? Keep appointments for follow-up doses. It is important not to miss your dose. Call your care team if you are unable to keep an appointment. What may interact with this medication? Medications for seizures Some antibiotics, such as amikacin, gentamicin, neomycin, streptomycin, tobramycin Vaccines This list may not describe all possible interactions. Give your health care provider a list of all the medicines, herbs, non-prescription drugs, or dietary  supplements you use. Also tell them if you smoke, drink alcohol, or use illegal drugs. Some items may interact with your medicine. What should I watch for while using this medication? Your condition will be monitored carefully while you are receiving this medication. You may need blood work while taking this medication. This medication may make you feel generally unwell. This is not uncommon, as chemotherapy can affect healthy cells as well as cancer cells. Report any side effects. Continue your course of treatment even though you feel ill unless your care team tells you to stop. In some cases, you may be given additional medications to help with side effects. Follow all directions for their use. This medication may increase your risk of getting an infection. Call your care team for advice if you get a fever, chills, sore throat, or other symptoms of a cold or flu. Do not treat yourself. Try to avoid being around people who are sick. Avoid taking medications that contain aspirin, acetaminophen, ibuprofen, naproxen, or ketoprofen unless instructed by your care team. These medications may hide a fever. Be careful brushing or flossing your  teeth or using a toothpick because you may get an infection or bleed more easily. If you have any dental work done, tell your dentist you are receiving this medication. Talk to your care team if you wish to become pregnant or think you might be pregnant. This medication can cause serious birth defects. Talk to your care team about effective forms of contraception. Do not breast-feed while taking this medication. What side effects may I notice from receiving this medication? Side effects that you should report to your care team as soon as possible: Allergic reactions--skin rash, itching, hives, swelling of the face, lips, tongue, or throat Infection--fever, chills, cough, sore throat, wounds that don't heal, pain or trouble when passing urine, general feeling of  discomfort or being unwell Low red blood cell level--unusual weakness or fatigue, dizziness, headache, trouble breathing Pain, tingling, or numbness in the hands or feet, muscle weakness, change in vision, confusion or trouble speaking, loss of balance or coordination, trouble walking, seizures Unusual bruising or bleeding Side effects that usually do not require medical attention (report to your care team if they continue or are bothersome): Hair loss Nausea Unusual weakness or fatigue Vomiting This list may not describe all possible side effects. Call your doctor for medical advice about side effects. You may report side effects to FDA at 1-800-FDA-1088. Where should I keep my medication? This medication is given in a hospital or clinic. It will not be stored at home. NOTE: This sheet is a summary. It may not cover all possible information. If you have questions about this medicine, talk to your doctor, pharmacist, or health care provider.  2024 Elsevier/Gold Standard (2021-05-05 00:00:00)

## 2023-06-28 ENCOUNTER — Ambulatory Visit

## 2023-06-28 ENCOUNTER — Ambulatory Visit
Admission: RE | Admit: 2023-06-28 | Discharge: 2023-06-28 | Disposition: A | Discharge status: Home / Self Care | Source: Ambulatory Visit | Attending: Radiation Oncology | Admitting: Radiation Oncology

## 2023-06-28 ENCOUNTER — Other Ambulatory Visit: Payer: Self-pay

## 2023-06-28 DIAGNOSIS — C3411 Malignant neoplasm of upper lobe, right bronchus or lung: Secondary | ICD-10-CM | POA: Diagnosis not present

## 2023-06-28 LAB — RAD ONC ARIA SESSION SUMMARY
Course Elapsed Days: 43
Plan Fractions Treated to Date: 31
Plan Prescribed Dose Per Fraction: 2 Gy
Plan Total Fractions Prescribed: 33
Plan Total Prescribed Dose: 66 Gy
Reference Point Dosage Given to Date: 62 Gy
Reference Point Session Dosage Given: 2 Gy
Session Number: 31

## 2023-06-29 ENCOUNTER — Ambulatory Visit
Admission: RE | Admit: 2023-06-29 | Discharge: 2023-06-29 | Disposition: A | Discharge status: Home / Self Care | Source: Ambulatory Visit | Attending: Radiation Oncology | Admitting: Radiation Oncology

## 2023-06-29 ENCOUNTER — Other Ambulatory Visit: Payer: Self-pay

## 2023-06-29 ENCOUNTER — Ambulatory Visit: Admitting: Urology

## 2023-06-29 VITALS — BP 118/70 | HR 92 | Ht 62.0 in | Wt 124.0 lb

## 2023-06-29 DIAGNOSIS — N135 Crossing vessel and stricture of ureter without hydronephrosis: Secondary | ICD-10-CM | POA: Diagnosis not present

## 2023-06-29 DIAGNOSIS — R351 Nocturia: Secondary | ICD-10-CM | POA: Diagnosis not present

## 2023-06-29 DIAGNOSIS — C3411 Malignant neoplasm of upper lobe, right bronchus or lung: Secondary | ICD-10-CM | POA: Diagnosis not present

## 2023-06-29 LAB — RAD ONC ARIA SESSION SUMMARY
Course Elapsed Days: 44
Plan Fractions Treated to Date: 32
Plan Prescribed Dose Per Fraction: 2 Gy
Plan Total Fractions Prescribed: 33
Plan Total Prescribed Dose: 66 Gy
Reference Point Dosage Given to Date: 64 Gy
Reference Point Session Dosage Given: 2 Gy
Session Number: 32

## 2023-06-29 MED ORDER — TROSPIUM CHLORIDE 20 MG PO TABS
20.0000 mg | ORAL_TABLET | Freq: Two times a day (BID) | ORAL | 1 refills | Status: DC
Start: 2023-06-29 — End: 2023-12-08

## 2023-06-29 NOTE — Progress Notes (Signed)
 I, Maysun Jamey Mccallum, acting as a scribe for Geraline Knapp, MD., have documented all relevant documentation on the behalf of Geraline Knapp, MD, as directed by Geraline Knapp, MD while in the presence of Geraline Knapp, MD.  06/29/2023 6:48 PM   Mancil Seat [birth year] [id number]  Referring provider: Sari Cunning, MD [phone] [facility] [address] [facility] [place],  [place] [address]    HPI: Heather [name] is a 88 y.o. female presents in follow-up by recent hospitalization.  History of chronic left UPJ obstruction, which was initially asymptomatic, however she had a bout of sepsis and pyonephrosis in November 2022 and has been managed with the induring stent since that time.  Stent last exchange, December 2024.  Recently hospitalized with diverticulitis and CT scan showed increased left hydronephrosis. Urine culture was positive, though she had no signs of symptomatic infection or pyelonephritis.  Her only complaint now has been nocturia x 4-5 over last 2 months Denies flank, abdominal, or pelvic pain.    PMH: Past Medical History:  Diagnosis Date   Anginal pain (HCC)    Benign essential hypertension    Brain aneurysm    Carotid artery stenosis    Carotid stenosis 06/19/2013   Colonic polyp    Emphysema lung (HCC)    GERD (gastroesophageal reflux disease)    Hiatal hernia    Hypercholesteremia    Hyperlipidemia, mixed 06/09/2015   Lung cancer (HCC) 2016   Lung nodule 06/21/2014   Macrocytic 10/06/2014   Multiple thyroid  nodules    PAT (paroxysmal atrial tachycardia) (HCC) 06/19/2013   Plantar fasciitis     Surgical History: Past Surgical History:  Procedure Laterality Date   BREAST EXCISIONAL BIOPSY Right 40 yrs ago   neg   BREAST LUMPECTOMY     benign   CATARACT EXTRACTION W/ INTRAOCULAR LENS  IMPLANT, BILATERAL Bilateral    CEREBRAL ANEURYSM REPAIR  2004   coil treatment   CERVIX LESION DESTRUCTION     COLONOSCOPY     1995, 2006, 2014    COLONOSCOPY WITH PROPOFOL  N/A 03/31/2015   Procedure: COLONOSCOPY WITH PROPOFOL ;  Surgeon: Cassie Click, MD;  Location: [facility] ENDOSCOPY;  Service: Endoscopy;  Laterality: N/A;   CYSTOSCOPY W/ RETROGRADES Left 03/02/2022   Procedure: CYSTOSCOPY WITH RETROGRADE PYELOGRAM;  Surgeon: Geraline Knapp, MD;  Location: [facility] ORS;  Service: Urology;  Laterality: Left;   CYSTOSCOPY W/ RETROGRADES Left 12/28/2022   Procedure: CYSTOSCOPY WITH RETROGRADE PYELOGRAM;  Surgeon: Geraline Knapp, MD;  Location: [facility] ORS;  Service: Urology;  Laterality: Left;   CYSTOSCOPY W/ URETERAL STENT PLACEMENT Left 03/03/2021   Procedure: CYSTOSCOPY WITH EXCHANGE;  Surgeon: Geraline Knapp, MD;  Location: [facility] ORS;  Service: Urology;  Laterality: Left;   CYSTOSCOPY W/ URETERAL STENT PLACEMENT Left 03/02/2022   Procedure: CYSTOSCOPY WITH STENT EXCHANGE;  Surgeon: Geraline Knapp, MD;  Location: [facility] ORS;  Service: Urology;  Laterality: Left;   CYSTOSCOPY W/ URETERAL STENT PLACEMENT Left 12/28/2022   Procedure: CYSTOSCOPY WITH STENT EXCHANGE;  Surgeon: Geraline Knapp, MD;  Location: [facility] ORS;  Service: Urology;  Laterality: Left;   CYSTOSCOPY WITH STENT PLACEMENT Left 11/25/2020   Procedure: CYSTOSCOPY WITH STENT PLACEMENT;  Surgeon: Geraline Knapp, MD;  Location: [facility] ORS;  Service: Urology;  Laterality: Left;   ENDOBRONCHIAL ULTRASOUND Bilateral 04/19/2023   Procedure: ENDOBRONCHIAL ULTRASOUND (EBUS);  Surgeon: Vergia Glasgow, MD;  Location: [facility] ORS;  Service: Pulmonary;  Laterality: Bilateral;  FLEXIBLE BRONCHOSCOPY N/A 03/10/2017   Procedure: FLEXIBLE BRONCHOSCOPY;  Surgeon: Petra Brandy, MD;  Location: [facility] ORS;  Service: Thoracic;  Laterality: N/A;   THORACOSCOPY WITH WEDGE RESECTION LUNG Right 03/13/2014   RUL   THORACOTOMY/LOBECTOMY Left 03/10/2017   Procedure: THORACOTOMY WITH LUNG WEDGE RESECTION POSSIBLE LOBECTOMY;  Surgeon: Petra Brandy, MD;  Location: [facility] ORS;  Service: Thoracic;  Laterality: Left;    TUBAL LIGATION      Home Medications:  Allergies as of 06/29/2023       Reactions   Ace Inhibitors Swelling   Contrast Media [iodinated Contrast Media]    Cortisone    Patient had facial swelling and itching from cortisone injection IM        Medication List        Accurate as of June 29, 2023  6:48 PM. If you have any questions, ask your nurse or doctor.          amLODipine  5 MG tablet Commonly known as: NORVASC  Take 5 mg by mouth at bedtime.   atenolol  50 MG tablet Commonly known as: TENORMIN  Take 50 mg by mouth at bedtime.   calcium -vitamin D  500-200 MG-UNIT tablet Commonly known as: OSCAL WITH D Take 1 tablet by mouth daily with breakfast.   cetirizine 10 MG tablet Commonly known as: ZYRTEC Take 10 mg by mouth daily.   cyanocobalamin  100 MCG tablet Commonly known as: VITAMIN B12 Take 100 mcg by mouth daily.   dextromethorphan-guaiFENesin  30-600 MG 12hr tablet Commonly known as: MUCINEX  DM Take 1 tablet by mouth 2 (two) times daily as needed for cough.   donepezil  10 MG tablet Commonly known as: ARICEPT  Take 1 tablet by mouth at bedtime.   fluticasone  50 MCG/ACT nasal spray Commonly known as: FLONASE  Place 1 spray into the nose daily as needed for allergies.   ondansetron  8 MG tablet Commonly known as: Zofran  Take 1 tablet (8 mg total) by mouth every 8 (eight) hours as needed for nausea or vomiting. Start on the third day after chemotherapy.   simvastatin  20 MG tablet Commonly known as: ZOCOR  Take 20 mg by mouth every morning.   trospium 20 MG tablet Commonly known as: SANCTURA Take 1 tablet (20 mg total) by mouth 2 (two) times daily. Started by: Geraline Knapp        Allergies:  Allergies  Allergen Reactions   Ace Inhibitors Swelling   Contrast Media [Iodinated Contrast Media]    Cortisone     Patient had facial swelling and itching from cortisone injection IM    Family History: Family History  Problem Relation Age of Onset    Alcohol abuse Mother    Heart attack Father    Breast cancer Neg Hx     Social History:  reports that she quit smoking about 35 years ago. Her smoking use included cigarettes. She started smoking about 70 years ago. She has a 35 pack-year smoking history. She has never used smokeless tobacco. She reports current alcohol use. She reports that she does not use drugs.   Physical Exam: BP 118/70   Pulse 92   Ht 5\' 2"  (1.575 m)   Wt 124 lb (56.2 kg)   BMI 22.68 kg/m   Constitutional:  Alert and oriented, No acute distress. HEENT: [place] AT Respiratory: Normal respiratory effort, no increased work of breathing. Psychiatric: Normal mood and affect.   Pertinent Imaging: CT 05/28/2023 was personally reviewed and interpreted. There is increased left hydronephrosis. No encrustation on the stent is noted.  CT  EXAM: CT ABDOMEN AND PELVIS WITHOUT CONTRAST   TECHNIQUE: Multidetector CT imaging of the abdomen and pelvis was performed following the standard protocol without IV contrast.   RADIATION DOSE REDUCTION: This exam was performed according to the departmental dose-optimization program which includes automated exposure control, adjustment of the mA and/or kV according to patient size and/or use of iterative reconstruction technique.   COMPARISON:  CT abdomen pelvis 11/25/2020 and PET/CT 03/30/2023   FINDINGS: Lower chest: Postsurgical changes left lower lobe. No acute abnormality.   Hepatobiliary: Unchanged hepatic cysts in the left hepatic lobe. Hypoattenuating in the posterior right hepatic lobe on series 2/image 24 measures 6 mm and is too small to definitively characterize but appear similar to 11/25/2020. Unremarkable gallbladder and biliary tree.   Pancreas: Unremarkable.   Spleen: Unremarkable.   Adrenals/Urinary Tract: Normal adrenal glands. Marked left hydronephrosis has increased compared to 03/30/2023. The left ureteral stent is unchanged in position compared to  recent PET/CT with the proximal loop in the distal portion of the dilated left renal pelvis. Stable right kidney. Unremarkable bladder.   Stomach/Bowel: Small hiatal hernia. No bowel obstruction. Colonic diverticulosis. Wall thickening about the sigmoid colon with mild adjacent stranding. Normal appendix.   Vascular/Lymphatic: Aortic atherosclerosis. No enlarged abdominal or pelvic lymph nodes.   Reproductive: No acute abnormality.   Other: Stranding about the sigmoid colon. No organized fluid collection or abscess. No free intraperitoneal air.   Musculoskeletal: No acute fracture or destructive osseous lesion.   IMPRESSION: 1. Acute uncomplicated sigmoid diverticulitis. Consider follow-up colonoscopy following resolution of patient's acute symptoms to exclude underlying mass. 2. Marked left hydronephrosis has increased compared to 03/30/2023. The left ureteral stent is unchanged in position compared to recent PET/CT with the proximal loop in the distal portion of the dilated left renal pelvis. Query stent dysfunction. 3. Aortic Atherosclerosis (ICD10-I70.0).     Electronically Signed   By: Rozell Cornet M.D.   On: 05/28/2023 03:00   Assessment & Plan:    1. Left UPJ Obstruction Increased hydronephrosis noted on recent CT, though patient remains asymptomatic. Discussed stent exchange versus changing her planned schedule. Current plan is for stent exchange in December 2025. Patient prefers to hold off on stent exchange until December unless symptoms develop.  2. Nocturia Trial of Trospium 20 mg one hour prior to bedtime.  Follow up in September 2025 for scheduling of her stent exchange.  I have reviewed the above documentation for accuracy and completeness, and I agree with the above.   Geraline Knapp, MD  [facility] [address] [place], [place] [address] [phone]

## 2023-06-30 ENCOUNTER — Other Ambulatory Visit: Payer: Self-pay

## 2023-06-30 ENCOUNTER — Ambulatory Visit

## 2023-06-30 ENCOUNTER — Encounter: Payer: Self-pay | Admitting: Urology

## 2023-06-30 ENCOUNTER — Ambulatory Visit
Admission: RE | Admit: 2023-06-30 | Discharge: 2023-06-30 | Disposition: A | Discharge status: Home / Self Care | Source: Ambulatory Visit | Attending: Radiation Oncology | Admitting: Radiation Oncology

## 2023-06-30 ENCOUNTER — Other Ambulatory Visit: Payer: Self-pay | Admitting: Oncology

## 2023-06-30 DIAGNOSIS — C3411 Malignant neoplasm of upper lobe, right bronchus or lung: Secondary | ICD-10-CM | POA: Diagnosis not present

## 2023-06-30 DIAGNOSIS — C3432 Malignant neoplasm of lower lobe, left bronchus or lung: Secondary | ICD-10-CM

## 2023-06-30 LAB — RAD ONC ARIA SESSION SUMMARY
Course Elapsed Days: 45
Plan Fractions Treated to Date: 33
Plan Prescribed Dose Per Fraction: 2 Gy
Plan Total Fractions Prescribed: 33
Plan Total Prescribed Dose: 66 Gy
Reference Point Dosage Given to Date: 66 Gy
Reference Point Session Dosage Given: 2 Gy
Session Number: 33

## 2023-06-30 NOTE — Progress Notes (Signed)
 DISCONTINUE OFF PATHWAY REGIMEN - Non-Small Cell Lung   [id number]  + Paclitaxel  (2/50) + RT weekly x 6 weeks:   Administer weekly during RT:     Paclitaxel       Carboplatin    **Always confirm dose/schedule in your pharmacy ordering system**  PRIOR TREATMENT: Off Pathway: Carboplatin  + Paclitaxel  (2/50) + RT weekly x 6 weeks  START OFF PATHWAY REGIMEN - Non-Small Cell Lung   [id number]:Durvalumab 10 mg/kg IV D1 q14 Days:   A cycle is every 14 days:     Durvalumab   **Always confirm dose/schedule in your pharmacy ordering system**  Patient Characteristics: Local Recurrence Therapeutic Status: Local Recurrence Intent of Therapy: Curative Intent, Discussed with Patient

## 2023-07-01 ENCOUNTER — Ambulatory Visit

## 2023-07-01 NOTE — Radiation Completion Notes (Signed)
 Patient Name: Heather [name], FYFE MRN: [id number] Date of Birth: 12/20/1935 Referring Physician: Timmy Forbes, M.D. Date of Service: 2023-07-01 Radiation Oncologist: Glenis Langdon, M.D.  [facility]                             RADIATION ONCOLOGY END OF TREATMENT NOTE     Diagnosis: C34.11 Malignant neoplasm of upper lobe, right bronchus or lung Staging on 2017-03-10: Malignant neoplasm of lower lobe of left lung (HCC) T=cT1a, N=cNX, M=cM0 Intent: Curative     HPI: Patient is a 88 year old female.  Her history is interesting she is status post a right upper lobe wedge resection back in 2016 for a 1.1 cm high-grade adenocarcinoma with pleural invasion.  She is also status post a Reds resection in 2019 for an 8 mm invasive adenocarcinoma with solid and acinar patterns.  She is recently on serial scans showed increasing mediastinal node.  This was hypermetabolic on PET CT scan although no evidence of distant metastatic disease or other lung pathology was noted.  She underwent bronchoscopy which was positive for metastatic non-small cell lung cancer.  She did also have on PET/CT a left oblique fissure which has slight hypermetabolic activity although has been stable since 23.  She also has double-J ureteral stents in place on the left.  She is asymptomatic specifically [place] cough hemoptysis chest tightness or any change in pulmonary status.  She has been seen by medical oncology and recommendations been made for concurrent chemoradiation therapy.  She is seen today for opinion.  Patient's brain MRI scan I have reviewed showing no evidence to suggest metastatic disease although it has not been formally read by radiology.      ==========DELIVERED PLANS==========  First Treatment Date: 2023-05-16 Last Treatment Date: 2023-06-30   Plan Name: Chest Site: Mediastinum Technique: IMRT Mode: Photon Dose Per Fraction: 2 Gy Prescribed Dose (Delivered / Prescribed): 66 Gy / 66 Gy Prescribed  Fxs (Delivered / Prescribed): 33 / 33     ==========ON TREATMENT VISIT DATES========== 2023-05-17, 2023-05-24, 2023-05-31, 2023-06-07, 2023-06-14, 2023-06-21, 2023-06-28     ==========UPCOMING VISITS==========       ==========APPENDIX - ON TREATMENT VISIT NOTES==========   See weekly On Treatment Notes in Epic for details in the Media tab (listed as Progress notes on the On Treatment Visit Dates listed above).

## 2023-07-04 ENCOUNTER — Other Ambulatory Visit: Payer: Self-pay | Admitting: *Deleted

## 2023-07-04 ENCOUNTER — Inpatient Hospital Stay

## 2023-07-04 ENCOUNTER — Other Ambulatory Visit: Payer: Self-pay

## 2023-07-04 ENCOUNTER — Encounter: Payer: Self-pay | Admitting: Nurse Practitioner

## 2023-07-04 ENCOUNTER — Inpatient Hospital Stay (HOSPITAL_BASED_OUTPATIENT_CLINIC_OR_DEPARTMENT_OTHER): Admitting: Nurse Practitioner

## 2023-07-04 ENCOUNTER — Telehealth: Payer: Self-pay | Admitting: *Deleted

## 2023-07-04 ENCOUNTER — Ambulatory Visit

## 2023-07-04 VITALS — BP 136/83 | HR 110 | Temp 97.8°F | Resp 19 | Wt 123.6 lb

## 2023-07-04 DIAGNOSIS — T66XXXA Radiation sickness, unspecified, initial encounter: Secondary | ICD-10-CM

## 2023-07-04 DIAGNOSIS — Z5189 Encounter for other specified aftercare: Secondary | ICD-10-CM | POA: Diagnosis not present

## 2023-07-04 DIAGNOSIS — K208 Other esophagitis without bleeding: Secondary | ICD-10-CM | POA: Diagnosis not present

## 2023-07-04 DIAGNOSIS — C3432 Malignant neoplasm of lower lobe, left bronchus or lung: Secondary | ICD-10-CM

## 2023-07-04 DIAGNOSIS — R300 Dysuria: Secondary | ICD-10-CM

## 2023-07-04 LAB — CBC WITH DIFFERENTIAL (CANCER CENTER ONLY)
Abs Immature Granulocytes: 0.01 10*3/uL (ref 0.00–0.07)
Basophils Absolute: 0 10*3/uL (ref 0.0–0.1)
Basophils Relative: 0 %
Eosinophils Absolute: 0 10*3/uL (ref 0.0–0.5)
Eosinophils Relative: 0 %
HCT: 37.4 % (ref 36.0–46.0)
Hemoglobin: 12.8 g/dL (ref 12.0–15.0)
Immature Granulocytes: 0 %
Lymphocytes Relative: 25 %
Lymphs Abs: 0.7 10*3/uL (ref 0.7–4.0)
MCH: 33.4 pg (ref 26.0–34.0)
MCHC: 34.2 g/dL (ref 30.0–36.0)
MCV: 97.7 fL (ref 80.0–100.0)
Monocytes Absolute: 0.4 10*3/uL (ref 0.1–1.0)
Monocytes Relative: 14 %
Neutro Abs: 1.7 10*3/uL (ref 1.7–7.7)
Neutrophils Relative %: 61 %
Platelet Count: 212 10*3/uL (ref 150–400)
RBC: 3.83 MIL/uL — ABNORMAL LOW (ref 3.87–5.11)
RDW: 14.1 % (ref 11.5–15.5)
WBC Count: 2.9 10*3/uL — ABNORMAL LOW (ref 4.0–10.5)
nRBC: 0 % (ref 0.0–0.2)

## 2023-07-04 LAB — CMP (CANCER CENTER ONLY)
ALT: 29 U/L (ref 0–44)
AST: 26 U/L (ref 15–41)
Albumin: 3.6 g/dL (ref 3.5–5.0)
Alkaline Phosphatase: 66 U/L (ref 38–126)
Anion gap: 11 (ref 5–15)
BUN: 21 mg/dL (ref 8–23)
CO2: 26 mmol/L (ref 22–32)
Calcium: 9.3 mg/dL (ref 8.9–10.3)
Chloride: 99 mmol/L (ref 98–111)
Creatinine: 0.8 mg/dL (ref 0.44–1.00)
GFR, Estimated: >60 mL/min (ref >60)
Glucose, Bld: 141 mg/dL — ABNORMAL HIGH (ref 70–99)
Potassium: 4.1 mmol/L (ref 3.5–5.1)
Sodium: 136 mmol/L (ref 135–145)
Total Bilirubin: 0.6 mg/dL (ref 0.0–1.2)
Total Protein: 7.2 g/dL (ref 6.5–8.1)

## 2023-07-04 MED ORDER — OLANZAPINE 5 MG PO TABS: 5.0000 mg | ORAL_TABLET | Freq: Every day | ORAL | 2 refills | Status: DC

## 2023-07-04 MED ORDER — SUCRALFATE 1 GM/10ML PO SUSP: 1.0000 g | Freq: Three times a day (TID) | ORAL | 1 refills | Status: DC

## 2023-07-04 NOTE — Telephone Encounter (Signed)
 [employer] for a patient that was approved to get olanzapine and it is good for 07/03/24

## 2023-07-04 NOTE — Progress Notes (Signed)
 Symptom Management Clinic  [facility] A Department of the [place]. [facility] [address] [place], [place] [address] [phone] (phone) [phone] (fax)  Patient Care Team: Sari Cunning, MD as PCP - General (Internal Medicine) Petra Brandy, MD (Inactive) as Referring Physician (Cardiothoracic Surgery) Timmy Forbes, MD as Consulting Physician (Oncology) Vergia Glasgow, MD as Consulting Physician (Pulmonary Disease) Drake Gens, RN as Oncology Nurse Navigator   Name of the patient: Heather [name]  [id number]  1935/08/08   Date of visit: 07/04/23  Diagnosis- Recurrent Lung Cancer  Chief complaint/ Reason for visit- Lethargy, Indigestion, Nausea  Heme/Onc history:  Oncology History  Primary cancer of right upper lobe of lung (HCC)  03/13/2014 Initial Diagnosis   Primary cancer of right upper lobe of lung (HCC)   05/16/2023 - 06/27/2023 Chemotherapy   Patient is on Treatment Plan : Carboplatin  + Paclitaxel  Weekly X 6 Weeks with XRT     08/04/2023 -  Chemotherapy   Patient is on Treatment Plan : LUNG Durvalumab (10) q14d     Malignant neoplasm of lower lobe of left lung (HCC)  03/10/2017 Initial Diagnosis   Malignant neoplasm of lower lobe of left lung (HCC)   04/27/2023 Cancer Staging   Staging form: Lung, AJCC 8th Edition - Pathologic stage from 04/27/2023: pN2, cM0 - Signed by Timmy Forbes, MD on 04/27/2023 Stage prefix: Recurrence   05/16/2023 - 06/27/2023 Chemotherapy   Patient is on Treatment Plan : Carboplatin  + Paclitaxel  Weekly X 6 Weeks with XRT     08/04/2023 -  Chemotherapy   Patient is on Treatment Plan : LUNG Durvalumab (10) q14d       Interval history- Heather [name] is a 88 y.o. female diagnosed with recurrent lung cancer, s/p concurrent chemo and radiation, completed on 06/30/23. She has ongoing indigestion like pressure in her esophagus, nausea, and lethargy. Symptoms aren't worsening but she feels like  she is losing weight d/t not being able to eat. Symptoms improve with zofran  but she prefers to minimize medications when possible. Complains of urinary frequency. Not painful. No fevers or chills.   Review of systems- Review of Systems  Constitutional:  Positive for malaise/fatigue and weight loss. Negative for chills and fever.  HENT:  Negative for hearing loss, nosebleeds, sore throat and tinnitus.   Eyes:  Negative for blurred vision and double vision.  Respiratory:  Negative for cough, hemoptysis, shortness of breath and wheezing.   Cardiovascular:  Negative for chest pain, palpitations and leg swelling.  Gastrointestinal:  Positive for heartburn and nausea. Negative for abdominal pain, blood in stool, constipation, diarrhea, melena and vomiting.  Genitourinary:  Positive for frequency. Negative for dysuria, flank pain, hematuria and urgency.  Musculoskeletal:  Negative for back pain, falls, joint pain and myalgias.  Skin:  Negative for itching and rash.  Neurological:  Negative for dizziness, tingling, sensory change, loss of consciousness, weakness and headaches.  Endo/Heme/Allergies:  Negative for environmental allergies. Does not bruise/bleed easily.  Psychiatric/Behavioral:  Negative for depression. The patient is not nervous/anxious and does not have insomnia.     Allergies  Allergen Reactions   Ace Inhibitors Swelling   Contrast Media [Iodinated Contrast Media]    Cortisone     Patient had facial swelling and itching from cortisone injection IM   Past Medical History:  Diagnosis Date   Anginal pain (HCC)    Benign essential hypertension    Brain aneurysm    Carotid artery  stenosis    Carotid stenosis 06/19/2013   Colonic polyp    Emphysema lung (HCC)    GERD (gastroesophageal reflux disease)    Hiatal hernia    Hypercholesteremia    Hyperlipidemia, mixed 06/09/2015   Lung cancer (HCC) 2016   Lung nodule 06/21/2014   Macrocytic 10/06/2014   Multiple thyroid  nodules     PAT (paroxysmal atrial tachycardia) (HCC) 06/19/2013   Plantar fasciitis    Past Surgical History:  Procedure Laterality Date   BREAST EXCISIONAL BIOPSY Right 40 yrs ago   neg   BREAST LUMPECTOMY     benign   CATARACT EXTRACTION W/ INTRAOCULAR LENS  IMPLANT, BILATERAL Bilateral    CEREBRAL ANEURYSM REPAIR  2004   coil treatment   CERVIX LESION DESTRUCTION     COLONOSCOPY     1995, 2006, 2014   COLONOSCOPY WITH PROPOFOL  N/A 03/31/2015   Procedure: COLONOSCOPY WITH PROPOFOL ;  Surgeon: Cassie Click, MD;  Location: [facility] ENDOSCOPY;  Service: Endoscopy;  Laterality: N/A;   CYSTOSCOPY W/ RETROGRADES Left 03/02/2022   Procedure: CYSTOSCOPY WITH RETROGRADE PYELOGRAM;  Surgeon: Geraline Knapp, MD;  Location: [facility] ORS;  Service: Urology;  Laterality: Left;   CYSTOSCOPY W/ RETROGRADES Left 12/28/2022   Procedure: CYSTOSCOPY WITH RETROGRADE PYELOGRAM;  Surgeon: Geraline Knapp, MD;  Location: [facility] OR[facility];  Service: Urology;  Laterality: Left;   CYSTOSCOPY W/ URETERAL STENT PLACEMENT Left 03/03/2021   Procedure: CYSTOSCOPY WITH EXCHANGE;  Surgeon: Geraline Knapp, MD;  Location: [facility] ORS;  Service: Urology;  Laterality: Left;   CYSTOSCOPY W/ URETERAL STENT PLACEMENT Left 03/02/2022   Procedure: CYSTOSCOPY WITH STENT EXCHANGE;  Surgeon: Geraline Knapp, MD;  Location: [facility];  Service: Urology;  Laterality: Left;   CYSTOSCOPY W/ URETERAL STENT PLACEMENT Left 12/28/2022   Procedure: CYSTOSCOPY WITH STENT EXCHANGE;  Surgeon: Geraline Knapp, MD;  Location: [facility];  Service: Urology;  Laterality: Left;   CYSTOSCOPY WITH STENT PLACEMENT Left 11/25/2020   Procedure: CYSTOSCOPY WITH STENT PLACEMENT;  Surgeon: Geraline Knapp, MD;  Location: [facility];  Service: Urology;  Laterality: Left;   ENDOBRONCHIAL ULTRASOUND Bilateral 04/19/2023   Procedure: ENDOBRONCHIAL ULTRASOUND (EBUS);  Surgeon: Vergia Glasgow, MD;  Location: [facility] OR[facility];  Service: Pulmonary;  Laterality: Bilateral;   FLEXIBLE  BRONCHOSCOPY N/A 03/10/2017   Procedure: FLEXIBLE BRONCHOSCOPY;  Surgeon: Petra Brandy, MD;  Location: [facility] OR[facility];  Service: Thoracic;  Laterality: N/A;   THORACOSCOPY WITH WEDGE RESECTION LUNG Right 03/13/2014   RUL   THORACOTOMY/LOBECTOMY Left 03/10/2017   Procedure: THORACOTOMY WITH LUNG WEDGE RESECTION POSSIBLE LOBECTOMY;  Surgeon: Petra Brandy, MD;  Location: [facility] OR[facility];  Service: Thoracic;  Laterality: Left;   TUBAL LIGATION     Social History   Socioeconomic History   Marital status: Married    Spouse name: Royston Cornea   Number of children: Not on file   Years of education: Not on file   Highest education level: Not on file  Occupational History   Not on file  Tobacco Use   Smoking status: Former    Current packs/day: 0.00    Average packs/day: 1 pack/day for 35.0 years (35.0 ttl pk-yrs)    Types: Cigarettes    Start date: 06/10/1953    Quit date: 06/10/1988    Years since quitting: 35.0   Smokeless tobacco: Never  Vaping Use   Vaping status: Never Used  Substance and Sexual Activity   Alcohol use: Yes    Alcohol/week: 0.0 standard drinks of alcohol    Comment:  glass of wine daily   Drug use: No   Sexual activity: Not on file  Other Topics Concern   Not on file  Social History Narrative   Not on file   Social Drivers of Health   Financial Resource Strain: Low Risk  (05/05/2023)   Received [facility]   Overall Financial Resource Strain (CARDIA)    Difficulty of Paying Living Expenses: Not hard at all  Food Insecurity: No Food Insecurity (05/28/2023)   Hunger Vital Sign    Worried About Running Out of Food in the Last Year: Never true    Ran Out of Food in the Last Year: Never true  Transportation Needs: No Transportation Needs (05/28/2023)   PRAPARE - Administrator, Civil Service (Medical): No    Lack of Transportation (Non-Medical): No  Physical Activity: Not on file  Stress: Not on file  Social Connections: Socially Integrated  (05/28/2023)   Social Connection and Isolation Panel [NHANES]    Frequency of Communication with Friends and Family: More than three times a week    Frequency of Social Gatherings with Friends and Family: Once a week    Attends Religious Services: More than 4 times per year    Active Member of [employer] or Organizations: Yes    Attends Engineer, structural: More than 4 times per year    Marital Status: Married  Catering manager Violence: Not At Risk (05/28/2023)   Humiliation, Afraid, Rape, and Kick questionnaire    Fear of Current or Ex-Partner: No    Emotionally Abused: No    Physically Abused: No    Sexually Abused: No   Family History  Problem Relation Age of Onset   Alcohol abuse Mother    Heart attack Father    Breast cancer Neg Hx    Current Outpatient Medications:    amLODipine  (NORVASC ) 5 MG tablet, Take 5 mg by mouth at bedtime., Disp: , Rfl:    atenolol  (TENORMIN ) 50 MG tablet, Take 50 mg by mouth at bedtime., Disp: , Rfl:    calcium -vitamin D  (OSCAL WITH D) 500-200 MG-UNIT tablet, Take 1 tablet by mouth daily with breakfast., Disp: , Rfl:    cetirizine (ZYRTEC) 10 MG tablet, Take 10 mg by mouth daily., Disp: , Rfl:    dextromethorphan-guaiFENesin  (MUCINEX  DM) 30-600 MG 12hr tablet, Take 1 tablet by mouth 2 (two) times daily as needed for cough., Disp: , Rfl:    donepezil  (ARICEPT ) 10 MG tablet, Take 1 tablet by mouth at bedtime., Disp: , Rfl:    fluticasone  (FLONASE ) 50 MCG/ACT nasal spray, Place 1 spray into the nose daily as needed for allergies. , Disp: , Rfl:    OLANZapine (ZYPREXA) 5 MG tablet, Take 1 tablet (5 mg total) by mouth at bedtime. For appetite, sleep, and nausea, Disp: 30 tablet, Rfl: 2   simvastatin  (ZOCOR ) 20 MG tablet, Take 20 mg by mouth every morning., Disp: , Rfl:    sucralfate (CARAFATE) 1 GM/10ML suspension, Take 10 mLs (1 g total) by mouth 4 (four) times daily -  with meals and at bedtime. For radiation esophagitis, Disp: 420 mL, Rfl: 1   trospium   (SANCTURA ) 20 MG tablet, Take 1 tablet (20 mg total) by mouth 2 (two) times daily., Disp: 30 tablet, Rfl: 1   vitamin B-12 (CYANOCOBALAMIN ) 100 MCG tablet, Take 100 mcg by mouth daily., Disp: , Rfl:   Physical exam:  Vitals:   07/04/23 1328  BP: 136/83  Pulse: [name])  110  Resp: 19  Temp: 97.8 F (36.6 C)  SpO2: 97%  Weight: 123 lb 9.6 oz (56.1 kg)   Physical Exam Vitals reviewed.  Constitutional:      Appearance: She is not ill-appearing.  Pulmonary:     Effort: Pulmonary effort is normal.  Abdominal:     General: There is no distension.  Musculoskeletal:        General: No deformity.  Skin:    Coloration: Skin is not pale.  Neurological:     Mental Status: She is alert and oriented to person, place, and time.  Psychiatric:        Mood and Affect: Mood normal.        Behavior: Behavior normal.        Latest Ref Rng & Units 07/04/2023    1:05 PM  CMP  Glucose 70 - 99 mg/dL 295   BUN 8 - 23 mg/dL 21   Creatinine 6.21 - 1.00 mg/dL 3.08   Sodium 657 - 846 mmol/L 136   Potassium 3.5 - 5.1 mmol/L 4.1   Chloride 98 - 111 mmol/L 99   CO2 22 - 32 mmol/L 26   Calcium  8.9 - 10.3 mg/dL 9.3   Total Protein 6.5 - 8.1 g/dL 7.2   Total Bilirubin 0.0 - 1.2 mg/dL 0.6   Alkaline Phos 38 - 126 U/L 66   AST 15 - 41 U/L 26   ALT 0 - 44 U/L 29       Latest Ref Rng & Units 07/04/2023    1:04 PM  CBC  WBC 4.0 - 10.5 K/uL 2.9   Hemoglobin 12.0 - 15.0 g/dL 96.2   Hematocrit 95.2 - 46.0 % 37.4   Platelets 150 - 400 K/uL 212     Assessment and plan- Patient is a 88 y.o. female   Recurrent NSCLC- completed radiation 06/30/23, completed carbo-taxol  on 06/27/23.  Convalescence following chemo & radiation- suspect symptoms are related to radiation esophagitis leading to reduced calorie intake, weight loss, and fatigue. Recommend starting sucralfate suspension 1 g four times a day. She's had good results with zofran  for nausea but I recommend adding olanzapine 5 mg at bedtime to help stimulate  appetite as well. She's in agreement. Declines IV fluids today and is tolerating liquids well. Recommend soft foods to help improve tolerance. Can return to clinic as needed for ongoing, unrelieved symptoms. She'll follow up with Dr Wilhelmenia Harada and Dr Jacalyn Martin for repeat imaging as scheduled.  Urinary frequency- unable to provide UA in clinic today. Can do home collection and return sample.   Disposition:  Rtc as scheduled for sooner if symptoms don't improve or worsen.    Visit Diagnosis 1. Radiation esophagitis   2. Convalescence after radiotherapy   3. Convalescence following chemotherapy    Patient expressed understanding and was in agreement with this plan. She also understands that She can call clinic at any time with any questions, concerns, or complaints.   Thank you for allowing me to participate in the care of this very pleasant patient.   Kenney Peacemaker, DNP, AGNP-C, AOCNP [facility] [phone]

## 2023-07-11 ENCOUNTER — Ambulatory Visit
Admission: RE | Admit: 2023-07-11 | Discharge: 2023-07-11 | Disposition: A | Discharge status: Home / Self Care | Source: Ambulatory Visit | Attending: Internal Medicine | Admitting: Internal Medicine

## 2023-07-11 ENCOUNTER — Other Ambulatory Visit: Payer: Self-pay | Admitting: Internal Medicine

## 2023-07-11 DIAGNOSIS — R109 Unspecified abdominal pain: Secondary | ICD-10-CM | POA: Insufficient documentation

## 2023-07-11 DIAGNOSIS — C3411 Malignant neoplasm of upper lobe, right bronchus or lung: Secondary | ICD-10-CM

## 2023-07-12 ENCOUNTER — Inpatient Hospital Stay

## 2023-07-12 NOTE — Progress Notes (Signed)
 Nutrition Follow-up:  Patient with recurrent non small cell lung cancer.  Patient receiving carboplatin , taxol  and radiation.    Spoke with patient via phone.  Reports that her appetite is better.  Ate a breakfast meal that was sent by her health insurance ([employer] [address] and [employer]) pancakes, eggs.  Planning beef roast, green beans and squash for dinner tonight.  Drinking a 350 calorie ensure during visit.  Denies nausea or indigestion at this time.  Says that she is taking olanzapine .     Medications: olanzapine   Labs: reviewed  Anthropometrics:   Weight 123 lb 9.6 oz on 6/9 124 lb on 5/27 128 lb on 5/12   NUTRITION DIAGNOSIS: Inadequate oral intake stable    INTERVENTION:  Continue 350 calorie oral nutrition supplement Continue foods as able.  Recent abdominal pain.  Seen by PCP.      MONITORING, EVALUATION, GOAL: weight trends, intake   NEXT VISIT: Thursday, July 10 during infusion  Danilo Cappiello B. Zollie Hipp, CSO, LDN Registered Dietitian [phone]

## 2023-07-21 NOTE — Progress Notes (Signed)
 Pharmacist Chemotherapy Monitoring - Initial Assessment    Anticipated start date: 08/04/23   The following has been reviewed per standard work regarding the patient's treatment regimen: The patient's diagnosis, treatment plan and drug doses, and organ/hematologic function Lab orders and baseline tests specific to treatment regimen  The treatment plan start date, drug sequencing, and pre-medications Prior authorization status  Patient's documented medication list, including drug-drug interaction screen and prescriptions for anti-emetics and supportive care specific to the treatment regimen The drug concentrations, fluid compatibility, administration routes, and timing of the medications to be used The patient's access for treatment and lifetime cumulative dose history, if applicable  The patient's medication allergies and previous infusion related reactions, if applicable   Changes made to treatment plan:  Post chemoradiotherapy  initiating q2 week durvalumab at 10mg /kg  Follow up needed:     Redell JINNY Gaskins, [place], 07/21/2023  2:36 PM

## 2023-07-27 ENCOUNTER — Ambulatory Visit: Admitting: Radiation Oncology

## 2023-07-27 ENCOUNTER — Ambulatory Visit
Admission: RE | Admit: 2023-07-27 | Discharge: 2023-07-27 | Disposition: A | Discharge status: Home / Self Care | Source: Ambulatory Visit | Attending: Oncology | Admitting: Oncology

## 2023-07-27 DIAGNOSIS — C3432 Malignant neoplasm of lower lobe, left bronchus or lung: Secondary | ICD-10-CM | POA: Diagnosis present

## 2023-08-01 ENCOUNTER — Encounter: Payer: Self-pay | Admitting: Oncology

## 2023-08-02 ENCOUNTER — Ambulatory Visit
Admission: RE | Admit: 2023-08-02 | Discharge: 2023-08-02 | Disposition: A | Discharge status: Home / Self Care | Source: Ambulatory Visit | Attending: Radiation Oncology | Admitting: Radiation Oncology

## 2023-08-02 VITALS — BP 125/66 | HR 73 | Temp 98.8°F | Resp 20 | Wt 125.7 lb

## 2023-08-02 DIAGNOSIS — C3411 Malignant neoplasm of upper lobe, right bronchus or lung: Secondary | ICD-10-CM | POA: Insufficient documentation

## 2023-08-02 NOTE — Progress Notes (Signed)
 Radiation Oncology Follow up Note  Name: Heather [name]   Date:   08/02/2023 MRN:  [id number] DOB: [birth year]    This 88 y.o. female presents to the clinic today for 1 month follow-up status post radiation therapy to her mediastinum for stage IIIa (T2a N2 M0) non-small cell lung cancer presenting as recurrent mediastinal positive node.[name]  REFERRING PROVIDER: Cleotilde Oneil FALCON, MD  HPI: Patient is a 88 year old female now out 1 month having completed radiation therapy to her mediastinum for recurrent stage IIIa non-small cell lung cancer.  Seen today in routine follow-up she is doing well she specifically [place] cough hemoptysis chest tightness or any dysphagia..  She had a recent CT scan although premature for true treatment effect did show interval decrease in the size of the precarinal lymph nodes on prior study.  She does have scattered small pulmonary nodules bilaterally unchanged.  COMPLICATIONS OF TREATMENT: none  FOLLOW UP COMPLIANCE: keeps appointments   PHYSICAL EXAM:  BP 125/66 (BP Location: Left Arm, Patient Position: Sitting, Cuff Size: Normal)   Pulse 73   Temp 98.8 F (37.1 C) (Tympanic)   Resp 20   Wt 125 lb 11.2 oz (57 kg)   SpO2 97%   BMI 22.99 kg/m  Well-developed well-nourished patient in NAD. HEENT reveals PERLA, EOMI, discs not visualized.  Oral cavity is clear. No oral mucosal lesions are identified. Neck is clear without evidence of cervical or supraclavicular adenopathy. Lungs are clear to A&P. Cardiac examination is essentially unremarkable with regular rate and rhythm without murmur rub or thrill. Abdomen is benign with no organomegaly or masses noted. Motor sensory and DTR levels are equal and symmetric in the upper and lower extremities. Cranial nerves II through XII are grossly intact. Proprioception is intact. No peripheral adenopathy or edema is identified. No motor or sensory levels are noted. Crude visual fields are within normal range.  RADIOLOGY RESULTS: CT scan  reviewed compatible with above-stated findings  PLAN: Present time patient is doing well very low side effect profile status post radiation therapy to her chest.  I am pleased with her overall progress I have asked to see her back in 3 months with a follow-up CT scan of her chest at that time.  Patient knows to call with any concerns.  She is slated to start immunotherapy with medical oncology which I have encouraged.  Patient knows to call with any concerns.  I would like to take this opportunity to thank you for allowing me to participate in the care of your patient.[name] [name], MD

## 2023-08-03 ENCOUNTER — Ambulatory Visit: Payer: Self-pay | Admitting: Urology

## 2023-08-04 ENCOUNTER — Inpatient Hospital Stay: Attending: Oncology

## 2023-08-04 ENCOUNTER — Inpatient Hospital Stay

## 2023-08-04 ENCOUNTER — Encounter: Payer: Self-pay | Admitting: Oncology

## 2023-08-04 ENCOUNTER — Inpatient Hospital Stay: Admitting: Oncology

## 2023-08-04 VITALS — BP 134/64 | HR 78

## 2023-08-04 VITALS — BP 136/68 | HR 85 | Temp 98.9°F | Resp 15 | Wt 121.0 lb

## 2023-08-04 DIAGNOSIS — Z860100 Personal history of colon polyps, unspecified: Secondary | ICD-10-CM | POA: Diagnosis not present

## 2023-08-04 DIAGNOSIS — K449 Diaphragmatic hernia without obstruction or gangrene: Secondary | ICD-10-CM | POA: Diagnosis not present

## 2023-08-04 DIAGNOSIS — C3432 Malignant neoplasm of lower lobe, left bronchus or lung: Secondary | ICD-10-CM | POA: Diagnosis not present

## 2023-08-04 DIAGNOSIS — G96191 Perineural cyst: Secondary | ICD-10-CM | POA: Insufficient documentation

## 2023-08-04 DIAGNOSIS — Z87891 Personal history of nicotine dependence: Secondary | ICD-10-CM | POA: Diagnosis not present

## 2023-08-04 DIAGNOSIS — C3411 Malignant neoplasm of upper lobe, right bronchus or lung: Secondary | ICD-10-CM | POA: Diagnosis not present

## 2023-08-04 DIAGNOSIS — K7689 Other specified diseases of liver: Secondary | ICD-10-CM | POA: Insufficient documentation

## 2023-08-04 DIAGNOSIS — R110 Nausea: Secondary | ICD-10-CM | POA: Diagnosis not present

## 2023-08-04 DIAGNOSIS — M8580 Other specified disorders of bone density and structure, unspecified site: Secondary | ICD-10-CM | POA: Diagnosis not present

## 2023-08-04 DIAGNOSIS — E782 Mixed hyperlipidemia: Secondary | ICD-10-CM | POA: Diagnosis not present

## 2023-08-04 DIAGNOSIS — R609 Edema, unspecified: Secondary | ICD-10-CM | POA: Diagnosis not present

## 2023-08-04 DIAGNOSIS — E7800 Pure hypercholesterolemia, unspecified: Secondary | ICD-10-CM | POA: Insufficient documentation

## 2023-08-04 DIAGNOSIS — J432 Centrilobular emphysema: Secondary | ICD-10-CM | POA: Diagnosis not present

## 2023-08-04 DIAGNOSIS — B370 Candidal stomatitis: Secondary | ICD-10-CM | POA: Insufficient documentation

## 2023-08-04 DIAGNOSIS — I10 Essential (primary) hypertension: Secondary | ICD-10-CM | POA: Insufficient documentation

## 2023-08-04 DIAGNOSIS — Z5112 Encounter for antineoplastic immunotherapy: Secondary | ICD-10-CM | POA: Diagnosis not present

## 2023-08-04 DIAGNOSIS — T451X5A Adverse effect of antineoplastic and immunosuppressive drugs, initial encounter: Secondary | ICD-10-CM | POA: Insufficient documentation

## 2023-08-04 DIAGNOSIS — K5732 Diverticulitis of large intestine without perforation or abscess without bleeding: Secondary | ICD-10-CM | POA: Diagnosis not present

## 2023-08-04 DIAGNOSIS — Z79899 Other long term (current) drug therapy: Secondary | ICD-10-CM | POA: Insufficient documentation

## 2023-08-04 DIAGNOSIS — N1330 Unspecified hydronephrosis: Secondary | ICD-10-CM

## 2023-08-04 DIAGNOSIS — I700 Atherosclerosis of aorta: Secondary | ICD-10-CM | POA: Insufficient documentation

## 2023-08-04 LAB — CMP (CANCER CENTER ONLY)
ALT: 20 U/L (ref 0–44)
AST: 27 U/L (ref 15–41)
Albumin: 3.6 g/dL (ref 3.5–5.0)
Alkaline Phosphatase: 66 U/L (ref 38–126)
Anion gap: 6 (ref 5–15)
BUN: 20 mg/dL (ref 8–23)
CO2: 25 mmol/L (ref 22–32)
Calcium: 9.1 mg/dL (ref 8.9–10.3)
Chloride: 106 mmol/L (ref 98–111)
Creatinine: 0.83 mg/dL (ref 0.44–1.00)
GFR, Estimated: >60 mL/min (ref >60)
Glucose, Bld: 101 mg/dL — ABNORMAL HIGH (ref 70–99)
Potassium: 4.2 mmol/L (ref 3.5–5.1)
Sodium: 137 mmol/L (ref 135–145)
Total Bilirubin: 0.5 mg/dL (ref 0.0–1.2)
Total Protein: 6.5 g/dL (ref 6.5–8.1)

## 2023-08-04 LAB — CBC WITH DIFFERENTIAL (CANCER CENTER ONLY)
Abs Immature Granulocytes: 0.02 K/uL (ref 0.00–0.07)
Basophils Absolute: 0 K/uL (ref 0.0–0.1)
Basophils Relative: 0 %
Eosinophils Absolute: 0.2 K/uL (ref 0.0–0.5)
Eosinophils Relative: 3 %
HCT: 39.7 % (ref 36.0–46.0)
Hemoglobin: 12.9 g/dL (ref 12.0–15.0)
Immature Granulocytes: 0 %
Lymphocytes Relative: 16 %
Lymphs Abs: 0.9 K/uL (ref 0.7–4.0)
MCH: 33.6 pg (ref 26.0–34.0)
MCHC: 32.5 g/dL (ref 30.0–36.0)
MCV: 103.4 fL — ABNORMAL HIGH (ref 80.0–100.0)
Monocytes Absolute: 0.5 K/uL (ref 0.1–1.0)
Monocytes Relative: 8 %
Neutro Abs: 4.2 K/uL (ref 1.7–7.7)
Neutrophils Relative %: 73 %
Platelet Count: 175 K/uL (ref 150–400)
RBC: 3.84 MIL/uL — ABNORMAL LOW (ref 3.87–5.11)
RDW: 14.6 % (ref 11.5–15.5)
WBC Count: 5.8 K/uL (ref 4.0–10.5)
nRBC: 0 % (ref 0.0–0.2)

## 2023-08-04 LAB — TSH: TSH: 3.176 u[IU]/mL (ref 0.350–4.500)

## 2023-08-04 MED ORDER — SODIUM CHLORIDE 0.9 % IV SOLN: 10.0000 mg/kg | Freq: Once | INTRAVENOUS | Status: DC

## 2023-08-04 MED ORDER — NYSTATIN 100000 UNIT/ML MT SUSP: 5.0000 mL | Freq: Four times a day (QID) | OROMUCOSAL | 0 refills | Status: DC

## 2023-08-04 MED ORDER — SODIUM CHLORIDE 0.9 % IV SOLN
INTRAVENOUS | Status: DC
  Filled 2023-08-04: qty 250

## 2023-08-04 MED ORDER — SODIUM CHLORIDE 0.9 % IV SOLN
10.0000 mg/kg | Freq: Once | INTRAVENOUS | Status: AC
  Administered 2023-08-04: 500 mg via INTRAVENOUS
  Filled 2023-08-04: qty 10

## 2023-08-04 NOTE — Patient Instructions (Signed)

## 2023-08-04 NOTE — Assessment & Plan Note (Signed)
Same plan as above.  

## 2023-08-04 NOTE — Assessment & Plan Note (Signed)
 Continue antiemetics as needed per instruction.

## 2023-08-04 NOTE — Assessment & Plan Note (Addendum)
#  History of stage Ib right lung upper lobe adenocarcinoma status post wedge resection in 2016 History of left lower lobe adenocarcinoma T1 a NX status post wedge resection 2019. Recurrence - Biopsy of pretracheal lymph node positive for NSCLC.  MRI brain is negative.  Recommend concurrent chemotherapy [carboplatin  taxol ]  with radiation.  Labs are reviewed and discussed with patient. S/p concurrent carboplatin  AUC2, taxol  45mg /m with Radiation.   CT scan showed partial response Recommend immunotherapy consolidation with Durvalumab  Q2weeks for up to a year.  Labs are reviewed and discussed with patient. Proceed with Durvalumab

## 2023-08-04 NOTE — Progress Notes (Signed)
 Hematology/Oncology Progress note [phone] [phone]     Chief Complaint:  Follow up for recurrent non small cell lung cancer   ASSESSMENT & PLAN:  Heather [name] is a 88 y.o. Heather [name] female with stage IB adenocarcinoma of the right upper lobe status post wedge resection on 03/13/2014. T2aN1M0 (stage IB).  She is s/p wedge resection of a left lower lobe mass on 03/10/2017. T1aNx (stage IA). No with locally recurrent NSCLC   Malignant neoplasm of lower lobe of left lung (HCC) #History of stage Ib right lung upper lobe adenocarcinoma status post wedge resection in 2016 History of left lower lobe adenocarcinoma T1 a NX status post wedge resection 2019. Recurrence - Biopsy of pretracheal lymph node positive for NSCLC.  MRI brain is negative.  Recommend concurrent chemotherapy [carboplatin  taxol ]  with radiation.  Labs are reviewed and discussed with patient. S/p concurrent carboplatin  AUC2, taxol  45mg /m with Radiation.   CT scan showed partial response Recommend immunotherapy consolidation with Durvalumab  Q2weeks for up to a year.  Labs are reviewed and discussed with patient. Proceed with Durvalumab      Chemotherapy-induced nausea Continue antiemetics as needed per instruction.  Encounter for antineoplastic immunotherapy Immunotherapy plan as listed above.  Hydronephrosis of left kidney Left UPJ Obstruction, patient was seen by urology and was offered to have stent exchange.  Patient prefers to hold off until December unless symptom develops.  Primary cancer of right upper lobe of lung [facility]) Same plan as above.   Thrush Recommend nystatin  mouth rinse swish and spit   Orders Placed This Encounter  Procedures   CBC with Differential (Cancer Center Only)    Standing Status:   Future    Expected Date:   08/18/2023    Expiration Date:   08/17/2024   CMP (Cancer Center only)    Standing Status:   Future    Expected Date:   08/18/2023    Expiration Date:    08/17/2024   CBC with Differential (Cancer Center Only)    Standing Status:   Future    Expected Date:   09/01/2023    Expiration Date:   08/31/2024   CMP (Cancer Center only)    Standing Status:   Future    Expected Date:   09/01/2023    Expiration Date:   08/31/2024   T4    Standing Status:   Future    Expected Date:   09/01/2023    Expiration Date:   08/31/2024   TSH    Standing Status:   Future    Expected Date:   09/01/2023    Expiration Date:   08/31/2024   Follow up 2 weeks. All questions were answered. The patient knows to call the clinic with any problems, questions or concerns.  We spent sufficient time to discuss many aspect of care, questions were answered to patient's satisfaction.   [name], MD, PhD [facility] Hematology Oncology 08/04/2023   PERTINENT ONCOLOGY HISTORY Previously follows up with Dr.Corcoran. Estasblish care with me on 03/15/2018  Oncology History  Primary cancer of right upper lobe of lung (HCC)  03/13/2014 Initial Diagnosis   Primary cancer of right upper lobe of lung   PET scan on 02/13/2014 revealed a 1 cm right upper lobe hypermetabolic nodule (SUV 3.8) and a 6 mm right upper lobe nodule (no metabolic activity). There was no mediastianl adenopathy.  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 Pathology revealed a 1.1 cm high-grade adenocarcinoma  with pleural invasion. Nodes were negative. Pathologic stage was T2aN1M0 (stage IB).     03/30/2023 Imaging   PET  1. Hypermetabolic pretracheal lymph node is concerning for metastatic adenopathy. 2. No evidence of local recurrence in the RIGHT upper lobe. 3. No evidence of metastatic disease in the abdomen pelvis. 4. Small nodule along the LEFT oblique fissure has metabolic activity; however, nodule has been stable in size since 03/16/2021. Favor benign nodule. 5. LEFT hydronephrosis with double-J ureteral stent in place.   05/16/2023 - 06/27/2023 Chemotherapy    Patient is on Treatment Plan : Carboplatin  + Paclitaxel  Weekly X 6 Weeks with XRT     07/31/2023 Imaging   CT chest wo contrast   1. Interval decrease in size of the precarinal lymph node seen on prior studies. This lymph node is seen to be hypermetabolic on PET-CT 03/30/2023. Close continued attention warranted. 2. Stable 8 mm retro hilar left lung nodule. Additional scattered smaller pulmonary nodules bilaterally are unchanged. 3. Stable focus of architectural distortion in the medial left upper lobe with a 5 mm nodular component. 4. Aortic Atherosclerosis (ICD10-I70.0) and Emphysema (ICD10-J43.9).   08/04/2023 -  Chemotherapy   Patient is on Treatment Plan : LUNG Durvalumab  (10) q14d     Malignant neoplasm of lower lobe of left lung (HCC)  03/10/2017 Initial Diagnosis   Malignant neoplasm of lower lobe of left lung   Chest CT on 02/10/2017 revealed clear interval progression of the posterior left lower lobe pulmonary nodule, now measuring 12 mm in long axis and having a distinctly lobular contour. Imaging features were very concerning for neoplasm.  There was no change in the 7 mm nodule posterior to the right hilum.   PET scan on 02/18/2017 revealed a 11 x 8 mm posterior left lower lobe pulmonary nodule that had mildly progressed from prior studies and demonstrated mild hypermetabolism (SUV 1.6).  The appearance was considered worrisome for an indolent primary bronchogenic neoplasm.   03/10/2017.  s/p wedge resection of a left lower lobe mass.   Pathology revealed an 8 mm invasive adenocarcinoma with solid and acinar patterns.  There was no visceral invasion.  There was no lymphovascular invasion.  All margins were negative.  Pathologic stage was T1aNx (stage IA).   03/30/2023 Imaging   PET  1. Hypermetabolic pretracheal lymph node is concerning for metastatic adenopathy. 2. No evidence of local recurrence in the RIGHT upper lobe. 3. No evidence of metastatic disease in the abdomen  pelvis. 4. Small nodule along the LEFT oblique fissure has metabolic activity; however, nodule has been stable in size since 03/16/2021. Favor benign nodule. 5. LEFT hydronephrosis with double-J ureteral stent in place.   04/27/2023 Cancer Staging   Staging form: Lung, AJCC 8th Edition - Pathologic stage from 04/27/2023: pN2, cM0 - Signed by Babara Call, MD on 04/27/2023 Stage prefix: Recurrence   05/16/2023 - 06/27/2023 Chemotherapy   Patient is on Treatment Plan : Carboplatin  + Paclitaxel  Weekly X 6 Weeks with XRT     07/31/2023 Imaging   CT chest wo contrast   1. Interval decrease in size of the precarinal lymph node seen on prior studies. This lymph node is seen to be hypermetabolic on PET-CT 03/30/2023. Close continued attention warranted. 2. Stable 8 mm retro hilar left lung nodule. Additional scattered smaller pulmonary nodules bilaterally are unchanged. 3. Stable focus of architectural distortion in the medial left upper lobe with a 5 mm nodular component. 4. Aortic Atherosclerosis (ICD10-I70.0) and Emphysema (ICD10-J43.9).   08/04/2023 -  Chemotherapy   Patient is on Treatment Plan : LUNG Durvalumab  (10) q14d       INTERVAL HISTORY Heather [name] is a 88 y.o. female who has above history reviewed by me today presents for follow up visit for management of recurrent lung cancer, .  Patient reports feeling well.  She was accompanied by her husband Denies shortness of breath, fatigue, hemoptysis, unintentional weight loss. No new complaints.  Patient has mild nausea and takes nausea medication occasionally. Decreased appetite.  Past Medical History:  Diagnosis Date   Anginal pain (HCC)    Benign essential hypertension    Brain aneurysm    Carotid artery stenosis    Carotid stenosis 06/19/2013   Colonic polyp    Emphysema lung (HCC)    GERD (gastroesophageal reflux disease)    Hiatal hernia    Hypercholesteremia    Hyperlipidemia, mixed 06/09/2015   Lung cancer (HCC) 2016    Lung nodule 06/21/2014   Macrocytic 10/06/2014   Multiple thyroid  nodules    PAT (paroxysmal atrial tachycardia) (HCC) 06/19/2013   Plantar fasciitis     Past Surgical History:  Procedure Laterality Date   BREAST EXCISIONAL BIOPSY Right 40 yrs ago   neg   BREAST LUMPECTOMY     benign   CATARACT EXTRACTION W/ INTRAOCULAR LENS  IMPLANT, BILATERAL Bilateral    CEREBRAL ANEURYSM REPAIR  2004   coil treatment   CERVIX LESION DESTRUCTION     COLONOSCOPY     1995, 2006, 2014   COLONOSCOPY WITH PROPOFOL  N/A 03/31/2015   Procedure: COLONOSCOPY WITH PROPOFOL ;  Surgeon: Lamar ONEIDA Holmes, MD;  Location: [facility] ENDOSCOPY;  Service: Endoscopy;  Laterality: N/A;   CYSTOSCOPY W/ RETROGRADES Left 03/02/2022   Procedure: CYSTOSCOPY WITH RETROGRADE PYELOGRAM;  Surgeon: Twylla Glendia JAYSON, MD;  Location: [facility] ORS;  Service: Urology;  Laterality: Left;   CYSTOSCOPY W/ RETROGRADES Left 12/28/2022   Procedure: CYSTOSCOPY WITH RETROGRADE PYELOGRAM;  Surgeon: Twylla Glendia JAYSON, MD;  Location: [facility] ORS;  Service: Urology;  Laterality: Left;   CYSTOSCOPY W/ URETERAL STENT PLACEMENT Left 03/03/2021   Procedure: CYSTOSCOPY WITH EXCHANGE;  Surgeon: Twylla Glendia JAYSON, MD;  Location: [facility] ORS;  Service: Urology;  Laterality: Left;   CYSTOSCOPY W/ URETERAL STENT PLACEMENT Left 03/02/2022   Procedure: CYSTOSCOPY WITH STENT EXCHANGE;  Surgeon: Twylla Glendia JAYSON, MD;  Location: [facility] ORS;  Service: Urology;  Laterality: Left;   CYSTOSCOPY W/ URETERAL STENT PLACEMENT Left 12/28/2022   Procedure: CYSTOSCOPY WITH STENT EXCHANGE;  Surgeon: Twylla Glendia JAYSON, MD;  Location: [facility] ORS;  Service: Urology;  Laterality: Left;   CYSTOSCOPY WITH STENT PLACEMENT Left 11/25/2020   Procedure: CYSTOSCOPY WITH STENT PLACEMENT;  Surgeon: Twylla Glendia JAYSON, MD;  Location: [facility] ORS;  Service: Urology;  Laterality: Left;   ENDOBRONCHIAL ULTRASOUND Bilateral 04/19/2023   Procedure: ENDOBRONCHIAL ULTRASOUND (EBUS);  Surgeon: Isadora Hose, MD;   Location: [facility] ORS;  Service: Pulmonary;  Laterality: Bilateral;   FLEXIBLE BRONCHOSCOPY N/A 03/10/2017   Procedure: FLEXIBLE BRONCHOSCOPY;  Surgeon: Volney Lye, MD;  Location: [facility] ORS;  Service: Thoracic;  Laterality: N/A;   THORACOSCOPY WITH WEDGE RESECTION LUNG Right 03/13/2014   RUL   THORACOTOMY/LOBECTOMY Left 03/10/2017   Procedure: THORACOTOMY WITH LUNG WEDGE RESECTION POSSIBLE LOBECTOMY;  Surgeon: Volney Lye, MD;  Location: [facility] ORS;  Service: Thoracic;  Laterality: Left;   TUBAL LIGATION      Family History  Problem Relation Age of Onset   Alcohol abuse Mother    Heart attack Father  Breast cancer Neg Hx     Social History:  reports that she quit smoking about 35 years ago. Her smoking use included cigarettes. She started smoking about 70 years ago. She has a 35 pack-year smoking history. She has never used smokeless tobacco. She reports current alcohol use. She reports that she does not use drugs.   Allergies:  Allergies  Allergen Reactions   Ace Inhibitors Swelling   Contrast Media [Iodinated Contrast Media]    Cortisone     Patient had facial swelling and itching from cortisone injection IM    Current Medications: Current Outpatient Medications  Medication Sig Dispense Refill   amLODipine  (NORVASC ) 5 MG tablet Take 5 mg by mouth at bedtime.     atenolol  (TENORMIN ) 50 MG tablet Take 50 mg by mouth at bedtime.     calcium -vitamin D  (OSCAL WITH D) 500-200 MG-UNIT tablet Take 1 tablet by mouth daily with breakfast.     cetirizine (ZYRTEC) 10 MG tablet Take 10 mg by mouth daily.     dextromethorphan-guaiFENesin  (MUCINEX  DM) 30-600 MG 12hr tablet Take 1 tablet by mouth 2 (two) times daily as needed for cough.     donepezil  (ARICEPT ) 10 MG tablet Take 1 tablet by mouth at bedtime.     fluticasone  (FLONASE ) 50 MCG/ACT nasal spray Place 1 spray into the nose daily as needed for allergies.      nystatin  (MYCOSTATIN ) 100000 UNIT/ML suspension Take 5 mLs (500,000 Units  total) by mouth 4 (four) times daily. 473 mL 0   OLANZapine  (ZYPREXA ) 5 MG tablet Take 1 tablet (5 mg total) by mouth at bedtime. For appetite, sleep, and nausea 30 tablet 2   simvastatin  (ZOCOR ) 20 MG tablet Take 20 mg by mouth every morning.     trospium  (SANCTURA ) 20 MG tablet Take 1 tablet (20 mg total) by mouth 2 (two) times daily. 30 tablet 1   vitamin B-12 (CYANOCOBALAMIN ) 100 MCG tablet Take 100 mcg by mouth daily.     sucralfate  (CARAFATE ) 1 GM/10ML suspension Take 10 mLs (1 g total) by mouth 4 (four) times daily -  with meals and at bedtime. For radiation esophagitis (Patient not taking: Reported on 08/04/2023) 420 mL 1   No current facility-administered medications for this visit.   Review of Systems  Constitutional:  Negative for appetite change, chills, fatigue and fever.  HENT:   Positive for hearing loss. Negative for voice change.   Eyes:  Negative for eye problems.  Respiratory:  Negative for chest tightness and cough.   Cardiovascular:  Negative for chest pain.  Gastrointestinal:  Negative for abdominal distention, abdominal pain, blood in stool and nausea.  Endocrine: Negative for hot flashes.  Genitourinary:  Negative for difficulty urinating and frequency.   Musculoskeletal:  Negative for arthralgias.       Chronic intermittent back pain  Skin:  Negative for itching and rash.  Neurological:  Negative for extremity weakness.  Hematological:  Negative for adenopathy.  Psychiatric/Behavioral:  Negative for confusion.     Performance status (ECOG): 0 - Asymptomatic  Vital Signs BP 136/68 (BP Location: Left Arm, Patient Position: Sitting, Cuff Size: Normal)   Pulse 85   Temp 98.9 F (37.2 C) (Tympanic)   Resp 15   Wt 121 lb (54.9 kg)   SpO2 98%   BMI 22.13 kg/m   Physical Exam Constitutional:      General: She is not in acute distress.    Appearance: She is not diaphoretic.  HENT:  Head: Normocephalic and atraumatic.     Mouth/Throat:     Comments:  Thrush Eyes:     General: No scleral icterus. Cardiovascular:     Rate and Rhythm: Normal rate and regular rhythm.  Pulmonary:     Effort: Pulmonary effort is normal. No respiratory distress.  Abdominal:     General: There is no distension.     Palpations: Abdomen is soft.     Tenderness: There is no abdominal tenderness.  Musculoskeletal:        General: Normal range of motion.     Cervical back: Normal range of motion and neck supple.  Skin:    General: Skin is dry.     Findings: No erythema.  Neurological:     Mental Status: She is alert and oriented to person, place, and time. Mental status is at baseline.  Psychiatric:        Mood and Affect: Mood and affect normal.     RADIOGRAPHIC STUDIES: I have personally reviewed the radiological images as listed and agreed with the findings in the report. CT Chest Wo Contrast Result Date: 07/31/2023 CLINICAL DATA:  Non-small-cell lung cancer. Restaging. * Tracking Code: BO * EXAM: CT CHEST WITHOUT CONTRAST TECHNIQUE: Multidetector CT imaging of the chest was performed following the standard protocol without IV contrast. RADIATION DOSE REDUCTION: This exam was performed according to the departmental dose-optimization program which includes automated exposure control, adjustment of the mA and/or kV according to patient size and/or use of iterative reconstruction technique. COMPARISON:  PET-CT 03/30/2023.  Chest CT 03/18/2023. FINDINGS: Cardiovascular: The heart size is normal. No substantial pericardial effusion. Coronary artery calcification is evident. Moderate atherosclerotic calcification is noted in the wall of the thoracic aorta. Mediastinum/Nodes: 13 mm short axis precarinal lymph node seen on prior studies is smaller in the interval measuring 10 mm short axis today on 54/2. No other mediastinal lymphadenopathy. No evidence for gross hilar lymphadenopathy although assessment is limited by the lack of intravenous contrast on the current  study. Tiny hiatal hernia. The esophagus has normal imaging features. There is no axillary lymphadenopathy. Lungs/Pleura: Centrilobular and paraseptal emphysema evident. Biapical pleuroparenchymal scarring evident. Focus of architectural distortion in the medial left upper lobe a nodular component measured previously at 5 mm stable at 5 mm today (29/4). 8 mm retro hilar left lung nodule measured previously is stable at 8 mm today (53/4). Subtle ground-glass opacity posterior right lower lobe stable on 78/4. No new suspicious pulmonary nodule or mass. No focal airspace consolidation. No pleural effusion. Surgical scarring noted posterior left lower lobe, stable. Upper Abdomen: Scattered tiny hypodensities in the liver parenchyma are too small to characterize but are statistically most likely benign. No followup imaging is recommended. Musculoskeletal: No worrisome lytic or sclerotic osseous abnormality. IMPRESSION: 1. Interval decrease in size of the precarinal lymph node seen on prior studies. This lymph node is seen to be hypermetabolic on PET-CT 03/30/2023. Close continued attention warranted. 2. Stable 8 mm retro hilar left lung nodule. Additional scattered smaller pulmonary nodules bilaterally are unchanged. 3. Stable focus of architectural distortion in the medial left upper lobe with a 5 mm nodular component. 4. Aortic Atherosclerosis (ICD10-I70.0) and Emphysema (ICD10-J43.9). Electronically Signed   By: Camellia Candle M.D.   On: 07/31/2023 10:28   CT ABDOMEN PELVIS WO CONTRAST Result Date: 07/11/2023 CLINICAL DATA:  Status post chemotherapy for lung cancer. Progressive abdominal pain. Change in bowel habits. Urinary incontinence. * Tracking Code: BO * EXAM: CT ABDOMEN AND PELVIS WITHOUT  CONTRAST TECHNIQUE: Multidetector CT imaging of the abdomen and pelvis was performed following the standard protocol without IV contrast. RADIATION DOSE REDUCTION: This exam was performed according to the departmental  dose-optimization program which includes automated exposure control, adjustment of the mA and/or kV according to patient size and/or use of iterative reconstruction technique. COMPARISON:  05/28/2023 FINDINGS: Lower chest: Left lower lobe surgical sutures. Normal heart size without pericardial or pleural effusion. Tiny hiatal hernia. Hepatobiliary: Hepatic cysts and too small to characterize lesions, suboptimally evaluated on noncontrast exam. Normal gallbladder, without biliary ductal dilatation. Pancreas: Normal pancreas, without duct dilatation. Foci of hyperattenuation within the expected location of the distal common duct including on 29/2 are present previously. Spleen: Normal in size, without focal abnormality. Adrenals/Urinary Tract: Normal adrenal glands. Moderate left-sided hydronephrosis is unchanged. Ureteric stent originates in a left extrarenal pelvis. Similar mild edema surrounds the left renal pelvis. The stent terminates in the left side of the urinary bladder. No bladder calculi. Stomach/Bowel: Normal remainder of the stomach. Moderate stool in the rectum. Extensive colonic diverticulosis. Sigmoid diverticulitis has resolved. Normal terminal ileum and appendix. Normal small bowel. Vascular/Lymphatic: Advanced aortic and branch vessel atherosclerosis. No abdominopelvic adenopathy. Reproductive: Normal uterus and adnexa. Other: No significant free fluid.  No free intraperitoneal air. Musculoskeletal: Osteopenia. Tarlov cysts. IMPRESSION: 1. Resolved sigmoid diverticulitis. 2. Similar appearance of moderate left-sided hydronephrosis with ureteric stent originating in the an extrarenal pelvis. Similar mild surrounding edema for which ascending infection cannot be excluded. Alternatively, this edema could simply be secondary to chronic hydronephrosis. 3. No other explanation for acute pain. 4. Stool in the rectum suggests constipation or even mild fecal impaction. 5. Hyperattenuation in the region of  the distal common duct has been present back to at least 2022, favoring vascular calcification. Nonobstructive choledocholithiasis felt unlikely. 6. Incidental findings, including: Tiny hiatal hernia. Aortic Atherosclerosis (ICD10-I70.0). Electronically Signed   By: Rockey Kilts M.D.   On: 07/11/2023 13:44   CT ABDOMEN PELVIS WO CONTRAST Result Date: 05/28/2023 CLINICAL DATA:  Lower abdominal pain and diarrhea. Near syncopal episodes. On chemo and radiation for lung carcinoma. EXAM: CT ABDOMEN AND PELVIS WITHOUT CONTRAST TECHNIQUE: Multidetector CT imaging of the abdomen and pelvis was performed following the standard protocol without IV contrast. RADIATION DOSE REDUCTION: This exam was performed according to the departmental dose-optimization program which includes automated exposure control, adjustment of the mA and/or kV according to patient size and/or use of iterative reconstruction technique. COMPARISON:  CT abdomen pelvis 11/25/2020 and PET/CT 03/30/2023 FINDINGS: Lower chest: Postsurgical changes left lower lobe. No acute abnormality. Hepatobiliary: Unchanged hepatic cysts in the left hepatic lobe. Hypoattenuating in the posterior right hepatic lobe on series 2/image 24 measures 6 mm and is too small to definitively characterize but appear similar to 11/25/2020. Unremarkable gallbladder and biliary tree. Pancreas: Unremarkable. Spleen: Unremarkable. Adrenals/Urinary Tract: Normal adrenal glands. Marked left hydronephrosis has increased compared to 03/30/2023. The left ureteral stent is unchanged in position compared to recent PET/CT with the proximal loop in the distal portion of the dilated left renal pelvis. Stable right kidney. Unremarkable bladder. Stomach/Bowel: Small hiatal hernia. No bowel obstruction. Colonic diverticulosis. Wall thickening about the sigmoid colon with mild adjacent stranding. Normal appendix. Vascular/Lymphatic: Aortic atherosclerosis. No enlarged abdominal or pelvic lymph nodes.  Reproductive: No acute abnormality. Other: Stranding about the sigmoid colon. No organized fluid collection or abscess. No free intraperitoneal air. Musculoskeletal: No acute fracture or destructive osseous lesion. IMPRESSION: 1. Acute uncomplicated sigmoid diverticulitis. Consider follow-up colonoscopy following resolution of  patient's acute symptoms to exclude underlying mass. 2. Marked left hydronephrosis has increased compared to 03/30/2023. The left ureteral stent is unchanged in position compared to recent PET/CT with the proximal loop in the distal portion of the dilated left renal pelvis. Query stent dysfunction. 3. Aortic Atherosclerosis (ICD10-I70.0). Electronically Signed   By: Norman Gatlin M.D.   On: 05/28/2023 03:00    Laboratory results were reviewed by me    Latest Ref Rng & Units 08/04/2023   10:30 AM 07/04/2023    1:04 PM 06/27/2023    8:35 AM  CBC  WBC 4.0 - 10.5 K/uL 5.8  2.9  2.4   Hemoglobin 12.0 - 15.0 g/dL 87.0  87.1  86.5   Hematocrit 36.0 - 46.0 % 39.7  37.4  40.1   Platelets 150 - 400 K/uL 175  212  162       Latest Ref Rng & Units 08/04/2023   10:31 AM 07/04/2023    1:05 PM 06/27/2023    8:35 AM  CMP  Glucose 70 - 99 mg/dL 898  858  92   BUN 8 - 23 mg/dL 20  21  25    Creatinine 0.44 - 1.00 mg/dL 9.16  9.19  9.40   Sodium 135 - 145 mmol/L 137  136  139   Potassium 3.5 - 5.1 mmol/L 4.2  4.1  4.1   Chloride 98 - 111 mmol/L 106  99  106   CO2 22 - 32 mmol/L 25  26  24    Calcium  8.9 - 10.3 mg/dL 9.1  9.3  8.9   Total Protein 6.5 - 8.1 g/dL 6.5  7.2  6.5   Total Bilirubin 0.0 - 1.2 mg/dL 0.5  0.6  0.4   Alkaline Phos 38 - 126 U/L 66  66  51   AST 15 - 41 U/L 27  26  19    ALT 0 - 44 U/L 20  29  17

## 2023-08-04 NOTE — Assessment & Plan Note (Signed)
 Immunotherapy plan as listed above

## 2023-08-04 NOTE — Progress Notes (Signed)
 Nutrition Follow-up:  Patient with recurrent non small cell lung cancer.  Starting durvalumab  today.  Met with patient and husband during infusion.  Reports that appetite is decreased.  She is eating ice cream (big bowls) 1-2 times a day.  Breakfast is yogurt or bagel with peanut butter.  Lunch maybe more yogurt. Had a salad with cheese recently.  Does not eat much in the evening.  May have more ice cream.  Drinking 350 calorie shake.  Recently ate pizza and chicken wing and woke up with indigestion, relieved with mylanta.  Reports not much of appetite and does not really know what she wants to eat.  Sweet foods are tasting better to her.     Medications: reviewed  Labs: reviewed  Anthropometrics:   Weight 121 lb  123 lb 9.6 oz on 6/9 124 lb on 5/27 128 lb on 5/12   NUTRITION DIAGNOSIS: Inadequate oral intake continues   INTERVENTION:  Discussed ways to add calories and protein to current foods that are working for her (nuts and fruit on ice cream and yogurt, etc).   Continue 350 calorie shake    MONITORING, EVALUATION, GOAL: weight trends, intake   NEXT VISIT: Friday, July 25 during infusion  Aleyda Gindlesperger B. Dasie SOLON, CSO, LDN Registered Dietitian [phone]

## 2023-08-04 NOTE — Assessment & Plan Note (Signed)
 Left UPJ Obstruction, patient was seen by urology and was offered to have stent exchange.  Patient prefers to hold off until December unless symptom develops.

## 2023-08-04 NOTE — Assessment & Plan Note (Addendum)
Recommend nystatin mouth rinse swish and spit.  

## 2023-08-05 LAB — T4: T4, Total: 5.9 ug/dL (ref 4.5–12.0)

## 2023-08-18 ENCOUNTER — Inpatient Hospital Stay

## 2023-08-18 ENCOUNTER — Encounter: Payer: Self-pay | Admitting: Oncology

## 2023-08-18 ENCOUNTER — Inpatient Hospital Stay (HOSPITAL_BASED_OUTPATIENT_CLINIC_OR_DEPARTMENT_OTHER): Admitting: Oncology

## 2023-08-18 VITALS — BP 148/63 | HR 82 | Temp 97.8°F | Resp 19 | Wt 125.0 lb

## 2023-08-18 DIAGNOSIS — C3411 Malignant neoplasm of upper lobe, right bronchus or lung: Secondary | ICD-10-CM

## 2023-08-18 DIAGNOSIS — Z5112 Encounter for antineoplastic immunotherapy: Secondary | ICD-10-CM | POA: Diagnosis not present

## 2023-08-18 DIAGNOSIS — C3432 Malignant neoplasm of lower lobe, left bronchus or lung: Secondary | ICD-10-CM

## 2023-08-18 LAB — CBC WITH DIFFERENTIAL (CANCER CENTER ONLY)
Abs Immature Granulocytes: 0.01 K/uL (ref 0.00–0.07)
Basophils Absolute: 0 K/uL (ref 0.0–0.1)
Basophils Relative: 0 %
Eosinophils Absolute: 0.1 K/uL (ref 0.0–0.5)
Eosinophils Relative: 3 %
HCT: 39.2 % (ref 36.0–46.0)
Hemoglobin: 13 g/dL (ref 12.0–15.0)
Immature Granulocytes: 0 %
Lymphocytes Relative: 27 %
Lymphs Abs: 1.3 K/uL (ref 0.7–4.0)
MCH: 33.6 pg (ref 26.0–34.0)
MCHC: 33.2 g/dL (ref 30.0–36.0)
MCV: 101.3 fL — ABNORMAL HIGH (ref 80.0–100.0)
Monocytes Absolute: 0.4 K/uL (ref 0.1–1.0)
Monocytes Relative: 9 %
Neutro Abs: 2.9 K/uL (ref 1.7–7.7)
Neutrophils Relative %: 61 %
Platelet Count: 195 K/uL (ref 150–400)
RBC: 3.87 MIL/uL (ref 3.87–5.11)
RDW: 13.7 % (ref 11.5–15.5)
WBC Count: 4.8 K/uL (ref 4.0–10.5)
nRBC: 0 % (ref 0.0–0.2)

## 2023-08-18 LAB — CMP (CANCER CENTER ONLY)
ALT: 15 U/L (ref 0–44)
AST: 21 U/L (ref 15–41)
Albumin: 3.9 g/dL (ref 3.5–5.0)
Alkaline Phosphatase: 70 U/L (ref 38–126)
Anion gap: 9 (ref 5–15)
BUN: 20 mg/dL (ref 8–23)
CO2: 24 mmol/L (ref 22–32)
Calcium: 9.1 mg/dL (ref 8.9–10.3)
Chloride: 104 mmol/L (ref 98–111)
Creatinine: 0.76 mg/dL (ref 0.44–1.00)
GFR, Estimated: >60 mL/min (ref >60)
Glucose, Bld: 95 mg/dL (ref 70–99)
Potassium: 4 mmol/L (ref 3.5–5.1)
Sodium: 137 mmol/L (ref 135–145)
Total Bilirubin: 0.5 mg/dL (ref 0.0–1.2)
Total Protein: 6.6 g/dL (ref 6.5–8.1)

## 2023-08-18 MED ORDER — SODIUM CHLORIDE 0.9 % IV SOLN
10.0000 mg/kg | Freq: Once | INTRAVENOUS | Status: AC
  Administered 2023-08-18: 500 mg via INTRAVENOUS
  Filled 2023-08-18: qty 10

## 2023-08-18 MED ORDER — SODIUM CHLORIDE 0.9 % IV SOLN
INTRAVENOUS | Status: DC
Start: 2023-08-18 — End: 2023-08-18
  Filled 2023-08-18: qty 250

## 2023-08-18 NOTE — Assessment & Plan Note (Signed)
Same plan as above.  

## 2023-08-18 NOTE — Assessment & Plan Note (Signed)
#  History of stage Ib right lung upper lobe adenocarcinoma status post wedge resection in 2016 History of left lower lobe adenocarcinoma T1 a NX status post wedge resection 2019. Recurrence - Biopsy of pretracheal lymph node positive for NSCLC.  MRI brain is negative.  Recommend concurrent chemotherapy [carboplatin  taxol ]  with radiation.  Labs are reviewed and discussed with patient. S/p concurrent carboplatin  AUC2, taxol  45mg /m with Radiation.   CT scan showed partial response Recommend immunotherapy consolidation with Durvalumab  Q2weeks for up to a year.  Labs are reviewed and discussed with patient. Proceed with Durvalumab

## 2023-08-18 NOTE — Assessment & Plan Note (Signed)
 Immunotherapy plan as listed above

## 2023-08-18 NOTE — Progress Notes (Signed)
 Nutrition Follow-up:  Patient with recurrent non small cell lung cancer.  Patient receiving durvalumab .    Met with patient and husband during infusion.  Reports that appetite is up and down.  Loving ice cream right now.  Had a 1/2 bagel with peanut butter and yogurt this am.  Planning on fish for lunch.     Medications: reviewed  Labs: reviewed  Anthropometrics:   Weight 125 lb today  121 lb on 7/10 123 lb 9.6 oz on 6/9 124 lb on 5/27 128 lb on 5/12  NUTRITION DIAGNOSIS: Inadequate oral intake continues   INTERVENTION:  Continue high calorie, high protein foods to help maintain weight    MONITORING, EVALUATION, GOAL: weight trends, intake   NEXT VISIT: as needed  Angeliah Wisdom B. Dasie SOLON, CSO, LDN Registered Dietitian [phone]

## 2023-08-18 NOTE — Progress Notes (Signed)
 Ok to keep imfinzi  dose at 500mg  although outside of the 10% parameter per Dr Babara

## 2023-08-18 NOTE — Progress Notes (Signed)
 Hematology/Oncology Progress note [phone] [phone]     Chief Complaint:  Follow up for recurrent non small cell lung cancer   ASSESSMENT & PLAN:  Heather [name] is a 88 y.o. Heather [name] female with stage IB adenocarcinoma of the right upper lobe status post wedge resection on 03/13/2014. T2aN1M0 (stage IB).  She is s/p wedge resection of a left lower lobe mass on 03/10/2017. T1aNx (stage IA). No with locally recurrent NSCLC   Malignant neoplasm of lower lobe of left lung (HCC) #History of stage Ib right lung upper lobe adenocarcinoma status post wedge resection in 2016 History of left lower lobe adenocarcinoma T1 a NX status post wedge resection 2019. Recurrence - Biopsy of pretracheal lymph node positive for NSCLC.  MRI brain is negative.  Recommend concurrent chemotherapy [carboplatin  taxol ]  with radiation.  Labs are reviewed and discussed with patient. S/p concurrent carboplatin  AUC2, taxol  45mg /m with Radiation.   CT scan showed partial response Recommend immunotherapy consolidation with Durvalumab  Q2weeks for up to a year.  Labs are reviewed and discussed with patient. Proceed with Durvalumab      Encounter for antineoplastic immunotherapy Immunotherapy plan as listed above.  Primary cancer of right upper lobe of lung [facility]) Same plan as above.    Orders Placed This Encounter  Procedures   CBC with Differential (Cancer Center Only)    Standing Status:   Future    Expected Date:   09/15/2023    Expiration Date:   09/14/2024   CMP (Cancer Center only)    Standing Status:   Future    Expected Date:   09/15/2023    Expiration Date:   09/14/2024   Follow up 2 weeks. All questions were answered. The patient knows to call the clinic with any problems, questions or concerns.  We spent sufficient time to discuss many aspect of care, questions were answered to patient's satisfaction.   [name], MD, PhD [facility] Hematology Oncology 08/18/2023   PERTINENT  ONCOLOGY HISTORY Previously follows up with Dr.Corcoran. Estasblish care with me on 03/15/2018  Oncology History  Primary cancer of right upper lobe of lung (HCC)  03/13/2014 Initial Diagnosis   Primary cancer of right upper lobe of lung   PET scan on 02/13/2014 revealed a 1 cm right upper lobe hypermetabolic nodule (SUV 3.8) and a 6 mm right upper lobe nodule (no metabolic activity). There was no mediastianl adenopathy.  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 Pathology revealed a 1.1 cm high-grade adenocarcinoma with pleural invasion. Nodes were negative. Pathologic stage was T2aN1M0 (stage IB).     03/30/2023 Imaging   PET  1. Hypermetabolic pretracheal lymph node is concerning for metastatic adenopathy. 2. No evidence of local recurrence in the RIGHT upper lobe. 3. No evidence of metastatic disease in the abdomen pelvis. 4. Small nodule along the LEFT oblique fissure has metabolic activity; however, nodule has been stable in size since 03/16/2021. Favor benign nodule. 5. LEFT hydronephrosis with double-J ureteral stent in place.   05/16/2023 - 06/27/2023 Chemotherapy   Patient is on Treatment Plan : Carboplatin  + Paclitaxel  Weekly X 6 Weeks with XRT     07/31/2023 Imaging   CT chest wo contrast   1. Interval decrease in size of the precarinal lymph node seen on prior studies. This lymph node is seen to be hypermetabolic on PET-CT 03/30/2023. Close continued attention warranted. 2. Stable 8 mm retro hilar left lung nodule. Additional scattered smaller pulmonary nodules  bilaterally are unchanged. 3. Stable focus of architectural distortion in the medial left upper lobe with a 5 mm nodular component. 4. Aortic Atherosclerosis (ICD10-I70.0) and Emphysema (ICD10-J43.9).   08/04/2023 -  Chemotherapy   Patient is on Treatment Plan : LUNG Durvalumab  (10) q14d     Malignant neoplasm of lower lobe of left lung (HCC)  03/10/2017 Initial  Diagnosis   Malignant neoplasm of lower lobe of left lung   Chest CT on 02/10/2017 revealed clear interval progression of the posterior left lower lobe pulmonary nodule, now measuring 12 mm in long axis and having a distinctly lobular contour. Imaging features were very concerning for neoplasm.  There was no change in the 7 mm nodule posterior to the right hilum.   PET scan on 02/18/2017 revealed a 11 x 8 mm posterior left lower lobe pulmonary nodule that had mildly progressed from prior studies and demonstrated mild hypermetabolism (SUV 1.6).  The appearance was considered worrisome for an indolent primary bronchogenic neoplasm.   03/10/2017.  s/p wedge resection of a left lower lobe mass.   Pathology revealed an 8 mm invasive adenocarcinoma with solid and acinar patterns.  There was no visceral invasion.  There was no lymphovascular invasion.  All margins were negative.  Pathologic stage was T1aNx (stage IA).   03/30/2023 Imaging   PET  1. Hypermetabolic pretracheal lymph node is concerning for metastatic adenopathy. 2. No evidence of local recurrence in the RIGHT upper lobe. 3. No evidence of metastatic disease in the abdomen pelvis. 4. Small nodule along the LEFT oblique fissure has metabolic activity; however, nodule has been stable in size since 03/16/2021. Favor benign nodule. 5. LEFT hydronephrosis with double-J ureteral stent in place.   04/27/2023 Cancer Staging   Staging form: Lung, AJCC 8th Edition - Pathologic stage from 04/27/2023: pN2, cM0 - Signed by Babara Call, MD on 04/27/2023 Stage prefix: Recurrence   05/16/2023 - 06/27/2023 Chemotherapy   Patient is on Treatment Plan : Carboplatin  + Paclitaxel  Weekly X 6 Weeks with XRT     07/31/2023 Imaging   CT chest wo contrast   1. Interval decrease in size of the precarinal lymph node seen on prior studies. This lymph node is seen to be hypermetabolic on PET-CT 03/30/2023. Close continued attention warranted. 2. Stable 8 mm retro hilar  left lung nodule. Additional scattered smaller pulmonary nodules bilaterally are unchanged. 3. Stable focus of architectural distortion in the medial left upper lobe with a 5 mm nodular component. 4. Aortic Atherosclerosis (ICD10-I70.0) and Emphysema (ICD10-J43.9).   08/04/2023 -  Chemotherapy   Patient is on Treatment Plan : LUNG Durvalumab  (10) q14d       INTERVAL HISTORY SHANE MELBY is a 88 y.o. female who has above history reviewed by me today presents for follow up visit for management of recurrent lung cancer, .  Patient reports feeling well.  She was accompanied by her husband Denies shortness of breath, fatigue, hemoptysis, unintentional weight loss. No new complaints.  Patient has mild nausea and takes nausea medication occasionally. Decreased appetite.  Past Medical History:  Diagnosis Date   Anginal pain (HCC)    Benign essential hypertension    Brain aneurysm    Carotid artery stenosis    Carotid stenosis 06/19/2013   Colonic polyp    Emphysema lung (HCC)    GERD (gastroesophageal reflux disease)    Hiatal hernia    Hypercholesteremia    Hyperlipidemia, mixed 06/09/2015   Lung cancer (HCC) 2016   Lung nodule 06/21/2014  Macrocytic 10/06/2014   Multiple thyroid  nodules    PAT (paroxysmal atrial tachycardia) (HCC) 06/19/2013   Plantar fasciitis     Past Surgical History:  Procedure Laterality Date   BREAST EXCISIONAL BIOPSY Right 40 yrs ago   neg   BREAST LUMPECTOMY     benign   CATARACT EXTRACTION W/ INTRAOCULAR LENS  IMPLANT, BILATERAL Bilateral    CEREBRAL ANEURYSM REPAIR  2004   coil treatment   CERVIX LESION DESTRUCTION     COLONOSCOPY     1995, 2006, 2014   COLONOSCOPY WITH PROPOFOL  N/A 03/31/2015   Procedure: COLONOSCOPY WITH PROPOFOL ;  Surgeon: Lamar ONEIDA Holmes, MD;  Location: [facility];  Service: Endoscopy;  Laterality: N/A;   CYSTOSCOPY W/ RETROGRADES Left 03/02/2022   Procedure: CYSTOSCOPY WITH RETROGRADE PYELOGRAM;  Surgeon: Twylla Glendia BROCKS, MD;  Location: [facility] ORS;  Service: Urology;  Laterality: Left;   CYSTOSCOPY W/ RETROGRADES Left 12/28/2022   Procedure: CYSTOSCOPY WITH RETROGRADE PYELOGRAM;  Surgeon: Twylla Glendia BROCKS, MD;  Location: [facility] OR[facility];  Service: Urology;  Laterality: Left;   CYSTOSCOPY W/ URETERAL STENT PLACEMENT Left 03/03/2021   Procedure: CYSTOSCOPY WITH EXCHANGE;  Surgeon: Twylla Glendia BROCKS, MD;  Location: [facility];  Service: Urology;  Laterality: Left;   CYSTOSCOPY W/ URETERAL STENT PLACEMENT Left 03/02/2022   Procedure: CYSTOSCOPY WITH STENT EXCHANGE;  Surgeon: Twylla Glendia BROCKS, MD;  Location: [facility];  Service: Urology;  Laterality: Left;   CYSTOSCOPY W/ URETERAL STENT PLACEMENT Left 12/28/2022   Procedure: CYSTOSCOPY WITH STENT EXCHANGE;  Surgeon: Twylla Glendia BROCKS, MD;  Location: [facility];  Service: Urology;  Laterality: Left;   CYSTOSCOPY WITH STENT PLACEMENT Left 11/25/2020   Procedure: CYSTOSCOPY WITH STENT PLACEMENT;  Surgeon: Twylla Glendia BROCKS, MD;  Location: [facility];  Service: Urology;  Laterality: Left;   ENDOBRONCHIAL ULTRASOUND Bilateral 04/19/2023   Procedure: ENDOBRONCHIAL ULTRASOUND (EBUS);  Surgeon: Isadora Hose, MD;  Location: [facility];  Service: Pulmonary;  Laterality: Bilateral;   FLEXIBLE BRONCHOSCOPY N/A 03/10/2017   Procedure: FLEXIBLE BRONCHOSCOPY;  Surgeon: Volney Lye, MD;  Location: [facility];  Service: Thoracic;  Laterality: N/A;   THORACOSCOPY WITH WEDGE RESECTION LUNG Right 03/13/2014   RUL   THORACOTOMY/LOBECTOMY Left 03/10/2017   Procedure: THORACOTOMY WITH LUNG WEDGE RESECTION POSSIBLE LOBECTOMY;  Surgeon: Volney Lye, MD;  Location: [facility];  Service: Thoracic;  Laterality: Left;   TUBAL LIGATION      Family History  Problem Relation Age of Onset   Alcohol abuse Mother    Heart attack Father    Breast cancer Neg Hx     Social History:  reports that she quit smoking about 35 years ago. Her smoking use included cigarettes. She started smoking about 70 years ago.  She has a 35 pack-year smoking history. She has never used smokeless tobacco. She reports current alcohol use. She reports that she does not use drugs.   Allergies:  Allergies  Allergen Reactions   Ace Inhibitors Swelling   Contrast Media [Iodinated Contrast Media]    Cortisone     Patient had facial swelling and itching from cortisone injection IM    Current Medications: Current Outpatient Medications  Medication Sig Dispense Refill   amLODipine  (NORVASC ) 5 MG tablet Take 5 mg by mouth at bedtime.     atenolol  (TENORMIN ) 50 MG tablet Take 50 mg by mouth at bedtime.     calcium -vitamin D  (OSCAL WITH D) 500-200 MG-UNIT tablet Take 1 tablet by mouth daily with breakfast.     cetirizine (ZYRTEC)  10 MG tablet Take 10 mg by mouth daily.     dextromethorphan-guaiFENesin  (MUCINEX  DM) 30-600 MG 12hr tablet Take 1 tablet by mouth 2 (two) times daily as needed for cough.     donepezil  (ARICEPT ) 10 MG tablet Take 1 tablet by mouth at bedtime.     fluticasone  (FLONASE ) 50 MCG/ACT nasal spray Place 1 spray into the nose daily as needed for allergies.      nystatin  (MYCOSTATIN ) 100000 UNIT/ML suspension Take 5 mLs (500,000 Units total) by mouth 4 (four) times daily. 473 mL 0   OLANZapine  (ZYPREXA ) 5 MG tablet Take 1 tablet (5 mg total) by mouth at bedtime. For appetite, sleep, and nausea 30 tablet 2   simvastatin  (ZOCOR ) 20 MG tablet Take 20 mg by mouth every morning.     trospium  (SANCTURA ) 20 MG tablet Take 1 tablet (20 mg total) by mouth 2 (two) times daily. 30 tablet 1   vitamin B-12 (CYANOCOBALAMIN ) 100 MCG tablet Take 100 mcg by mouth daily.     sucralfate  (CARAFATE ) 1 GM/10ML suspension Take 10 mLs (1 g total) by mouth 4 (four) times daily -  with meals and at bedtime. For radiation esophagitis (Patient not taking: Reported on 08/18/2023) 420 mL 1   No current facility-administered medications for this visit.   Review of Systems  Constitutional:  Negative for appetite change, chills, fatigue  and fever.  HENT:   Positive for hearing loss. Negative for voice change.   Eyes:  Negative for eye problems.  Respiratory:  Negative for chest tightness and cough.   Cardiovascular:  Negative for chest pain.  Gastrointestinal:  Negative for abdominal distention, abdominal pain, blood in stool and nausea.  Endocrine: Negative for hot flashes.  Genitourinary:  Negative for difficulty urinating and frequency.   Musculoskeletal:  Negative for arthralgias.       Chronic intermittent back pain  Skin:  Negative for itching and rash.  Neurological:  Negative for extremity weakness.  Hematological:  Negative for adenopathy.  Psychiatric/Behavioral:  Negative for confusion.     Performance status (ECOG): 0 - Asymptomatic  Vital Signs BP (!) 148/63   Pulse 82   Temp 97.8 F (36.6 C)   Resp 19   Wt 125 lb (56.7 kg)   SpO2 98%   BMI 22.86 kg/m   Physical Exam Constitutional:      General: She is not in acute distress.    Appearance: She is not diaphoretic.  HENT:     Head: Normocephalic and atraumatic.  Eyes:     General: No scleral icterus. Cardiovascular:     Rate and Rhythm: Normal rate and regular rhythm.  Pulmonary:     Effort: Pulmonary effort is normal. No respiratory distress.  Abdominal:     General: There is no distension.     Palpations: Abdomen is soft.     Tenderness: There is no abdominal tenderness.  Musculoskeletal:        General: Normal range of motion.     Cervical back: Normal range of motion and neck supple.  Skin:    General: Skin is dry.     Findings: No erythema.  Neurological:     Mental Status: She is alert and oriented to person, place, and time. Mental status is at baseline.  Psychiatric:        Mood and Affect: Mood and affect normal.     RADIOGRAPHIC STUDIES: I have personally reviewed the radiological images as listed and agreed with the findings in the  report. CT Chest Wo Contrast Result Date: 07/31/2023 CLINICAL DATA:  Non-small-cell  lung cancer. Restaging. * Tracking Code: BO * EXAM: CT CHEST WITHOUT CONTRAST TECHNIQUE: Multidetector CT imaging of the chest was performed following the standard protocol without IV contrast. RADIATION DOSE REDUCTION: This exam was performed according to the departmental dose-optimization program which includes automated exposure control, adjustment of the mA and/or kV according to patient size and/or use of iterative reconstruction technique. COMPARISON:  PET-CT 03/30/2023.  Chest CT 03/18/2023. FINDINGS: Cardiovascular: The heart size is normal. No substantial pericardial effusion. Coronary artery calcification is evident. Moderate atherosclerotic calcification is noted in the wall of the thoracic aorta. Mediastinum/Nodes: 13 mm short axis precarinal lymph node seen on prior studies is smaller in the interval measuring 10 mm short axis today on 54/2. No other mediastinal lymphadenopathy. No evidence for gross hilar lymphadenopathy although assessment is limited by the lack of intravenous contrast on the current study. Tiny hiatal hernia. The esophagus has normal imaging features. There is no axillary lymphadenopathy. Lungs/Pleura: Centrilobular and paraseptal emphysema evident. Biapical pleuroparenchymal scarring evident. Focus of architectural distortion in the medial left upper lobe a nodular component measured previously at 5 mm stable at 5 mm today (29/4). 8 mm retro hilar left lung nodule measured previously is stable at 8 mm today (53/4). Subtle ground-glass opacity posterior right lower lobe stable on 78/4. No new suspicious pulmonary nodule or mass. No focal airspace consolidation. No pleural effusion. Surgical scarring noted posterior left lower lobe, stable. Upper Abdomen: Scattered tiny hypodensities in the liver parenchyma are too small to characterize but are statistically most likely benign. No followup imaging is recommended. Musculoskeletal: No worrisome lytic or sclerotic osseous abnormality.  IMPRESSION: 1. Interval decrease in size of the precarinal lymph node seen on prior studies. This lymph node is seen to be hypermetabolic on PET-CT 03/30/2023. Close continued attention warranted. 2. Stable 8 mm retro hilar left lung nodule. Additional scattered smaller pulmonary nodules bilaterally are unchanged. 3. Stable focus of architectural distortion in the medial left upper lobe with a 5 mm nodular component. 4. Aortic Atherosclerosis (ICD10-I70.0) and Emphysema (ICD10-J43.9). Electronically Signed   By: Camellia Candle M.D.   On: 07/31/2023 10:28   CT ABDOMEN PELVIS WO CONTRAST Result Date: 07/11/2023 CLINICAL DATA:  Status post chemotherapy for lung cancer. Progressive abdominal pain. Change in bowel habits. Urinary incontinence. * Tracking Code: BO * EXAM: CT ABDOMEN AND PELVIS WITHOUT CONTRAST TECHNIQUE: Multidetector CT imaging of the abdomen and pelvis was performed following the standard protocol without IV contrast. RADIATION DOSE REDUCTION: This exam was performed according to the departmental dose-optimization program which includes automated exposure control, adjustment of the mA and/or kV according to patient size and/or use of iterative reconstruction technique. COMPARISON:  05/28/2023 FINDINGS: Lower chest: Left lower lobe surgical sutures. Normal heart size without pericardial or pleural effusion. Tiny hiatal hernia. Hepatobiliary: Hepatic cysts and too small to characterize lesions, suboptimally evaluated on noncontrast exam. Normal gallbladder, without biliary ductal dilatation. Pancreas: Normal pancreas, without duct dilatation. Foci of hyperattenuation within the expected location of the distal common duct including on 29/2 are present previously. Spleen: Normal in size, without focal abnormality. Adrenals/Urinary Tract: Normal adrenal glands. Moderate left-sided hydronephrosis is unchanged. Ureteric stent originates in a left extrarenal pelvis. Similar mild edema surrounds the left renal  pelvis. The stent terminates in the left side of the urinary bladder. No bladder calculi. Stomach/Bowel: Normal remainder of the stomach. Moderate stool in the rectum. Extensive colonic diverticulosis. Sigmoid diverticulitis has  resolved. Normal terminal ileum and appendix. Normal small bowel. Vascular/Lymphatic: Advanced aortic and branch vessel atherosclerosis. No abdominopelvic adenopathy. Reproductive: Normal uterus and adnexa. Other: No significant free fluid.  No free intraperitoneal air. Musculoskeletal: Osteopenia. Tarlov cysts. IMPRESSION: 1. Resolved sigmoid diverticulitis. 2. Similar appearance of moderate left-sided hydronephrosis with ureteric stent originating in the an extrarenal pelvis. Similar mild surrounding edema for which ascending infection cannot be excluded. Alternatively, this edema could simply be secondary to chronic hydronephrosis. 3. No other explanation for acute pain. 4. Stool in the rectum suggests constipation or even mild fecal impaction. 5. Hyperattenuation in the region of the distal common duct has been present back to at least 2022, favoring vascular calcification. Nonobstructive choledocholithiasis felt unlikely. 6. Incidental findings, including: Tiny hiatal hernia. Aortic Atherosclerosis (ICD10-I70.0). Electronically Signed   By: Rockey Kilts M.D.   On: 07/11/2023 13:44   CT ABDOMEN PELVIS WO CONTRAST Result Date: 05/28/2023 CLINICAL DATA:  Lower abdominal pain and diarrhea. Near syncopal episodes. On chemo and radiation for lung carcinoma. EXAM: CT ABDOMEN AND PELVIS WITHOUT CONTRAST TECHNIQUE: Multidetector CT imaging of the abdomen and pelvis was performed following the standard protocol without IV contrast. RADIATION DOSE REDUCTION: This exam was performed according to the departmental dose-optimization program which includes automated exposure control, adjustment of the mA and/or kV according to patient size and/or use of iterative reconstruction technique. COMPARISON:   CT abdomen pelvis 11/25/2020 and PET/CT 03/30/2023 FINDINGS: Lower chest: Postsurgical changes left lower lobe. No acute abnormality. Hepatobiliary: Unchanged hepatic cysts in the left hepatic lobe. Hypoattenuating in the posterior right hepatic lobe on series 2/image 24 measures 6 mm and is too small to definitively characterize but appear similar to 11/25/2020. Unremarkable gallbladder and biliary tree. Pancreas: Unremarkable. Spleen: Unremarkable. Adrenals/Urinary Tract: Normal adrenal glands. Marked left hydronephrosis has increased compared to 03/30/2023. The left ureteral stent is unchanged in position compared to recent PET/CT with the proximal loop in the distal portion of the dilated left renal pelvis. Stable right kidney. Unremarkable bladder. Stomach/Bowel: Small hiatal hernia. No bowel obstruction. Colonic diverticulosis. Wall thickening about the sigmoid colon with mild adjacent stranding. Normal appendix. Vascular/Lymphatic: Aortic atherosclerosis. No enlarged abdominal or pelvic lymph nodes. Reproductive: No acute abnormality. Other: Stranding about the sigmoid colon. No organized fluid collection or abscess. No free intraperitoneal air. Musculoskeletal: No acute fracture or destructive osseous lesion. IMPRESSION: 1. Acute uncomplicated sigmoid diverticulitis. Consider follow-up colonoscopy following resolution of patient's acute symptoms to exclude underlying mass. 2. Marked left hydronephrosis has increased compared to 03/30/2023. The left ureteral stent is unchanged in position compared to recent PET/CT with the proximal loop in the distal portion of the dilated left renal pelvis. Query stent dysfunction. 3. Aortic Atherosclerosis (ICD10-I70.0). Electronically Signed   By: Norman Gatlin M.D.   On: 05/28/2023 03:00    Laboratory results were reviewed by me    Latest Ref Rng & Units 08/18/2023   10:56 AM 08/04/2023   10:30 AM 07/04/2023    1:04 PM  CBC  WBC 4.0 - 10.5 K/uL 4.8  5.8  2.9    Hemoglobin 12.0 - 15.0 g/dL 86.9  87.0  87.1   Hematocrit 36.0 - 46.0 % 39.2  39.7  37.4   Platelets 150 - 400 K/uL 195  175  212       Latest Ref Rng & Units 08/18/2023   10:56 AM 08/04/2023   10:31 AM 07/04/2023    1:05 PM  CMP  Glucose 70 - 99 mg/dL 95  898  141   BUN 8 - 23 mg/dL 20  20  21    Creatinine 0.44 - 1.00 mg/dL 9.23  9.16  9.19   Sodium 135 - 145 mmol/L 137  137  136   Potassium 3.5 - 5.1 mmol/L 4.0  4.2  4.1   Chloride 98 - 111 mmol/L 104  106  99   CO2 22 - 32 mmol/L 24  25  26    Calcium  8.9 - 10.3 mg/dL 9.1  9.1  9.3   Total Protein 6.5 - 8.1 g/dL 6.6  6.5  7.2   Total Bilirubin 0.0 - 1.2 mg/dL 0.5  0.5  0.6   Alkaline Phos 38 - 126 U/L 70  66  66   AST 15 - 41 U/L 21  27  26    ALT 0 - 44 U/L 15  20  29

## 2023-09-01 ENCOUNTER — Encounter: Payer: Self-pay | Admitting: Oncology

## 2023-09-01 ENCOUNTER — Inpatient Hospital Stay (HOSPITAL_BASED_OUTPATIENT_CLINIC_OR_DEPARTMENT_OTHER): Admitting: Oncology

## 2023-09-01 ENCOUNTER — Inpatient Hospital Stay

## 2023-09-01 ENCOUNTER — Inpatient Hospital Stay: Attending: Oncology

## 2023-09-01 VITALS — BP 137/63 | HR 63 | Temp 95.0°F | Resp 19

## 2023-09-01 VITALS — BP 143/63 | HR 62 | Temp 96.9°F | Resp 18 | Wt 127.1 lb

## 2023-09-01 DIAGNOSIS — C3411 Malignant neoplasm of upper lobe, right bronchus or lung: Secondary | ICD-10-CM

## 2023-09-01 DIAGNOSIS — M8580 Other specified disorders of bone density and structure, unspecified site: Secondary | ICD-10-CM | POA: Insufficient documentation

## 2023-09-01 DIAGNOSIS — I6529 Occlusion and stenosis of unspecified carotid artery: Secondary | ICD-10-CM | POA: Diagnosis not present

## 2023-09-01 DIAGNOSIS — C3432 Malignant neoplasm of lower lobe, left bronchus or lung: Secondary | ICD-10-CM

## 2023-09-01 DIAGNOSIS — Z9221 Personal history of antineoplastic chemotherapy: Secondary | ICD-10-CM | POA: Diagnosis not present

## 2023-09-01 DIAGNOSIS — Z87891 Personal history of nicotine dependence: Secondary | ICD-10-CM | POA: Insufficient documentation

## 2023-09-01 DIAGNOSIS — N1330 Unspecified hydronephrosis: Secondary | ICD-10-CM | POA: Insufficient documentation

## 2023-09-01 DIAGNOSIS — E785 Hyperlipidemia, unspecified: Secondary | ICD-10-CM | POA: Insufficient documentation

## 2023-09-01 DIAGNOSIS — I700 Atherosclerosis of aorta: Secondary | ICD-10-CM | POA: Insufficient documentation

## 2023-09-01 DIAGNOSIS — Z923 Personal history of irradiation: Secondary | ICD-10-CM | POA: Insufficient documentation

## 2023-09-01 DIAGNOSIS — R32 Unspecified urinary incontinence: Secondary | ICD-10-CM | POA: Diagnosis not present

## 2023-09-01 DIAGNOSIS — R609 Edema, unspecified: Secondary | ICD-10-CM | POA: Diagnosis not present

## 2023-09-01 DIAGNOSIS — I10 Essential (primary) hypertension: Secondary | ICD-10-CM | POA: Diagnosis not present

## 2023-09-01 DIAGNOSIS — E7800 Pure hypercholesterolemia, unspecified: Secondary | ICD-10-CM | POA: Diagnosis not present

## 2023-09-01 DIAGNOSIS — K5730 Diverticulosis of large intestine without perforation or abscess without bleeding: Secondary | ICD-10-CM | POA: Insufficient documentation

## 2023-09-01 DIAGNOSIS — K219 Gastro-esophageal reflux disease without esophagitis: Secondary | ICD-10-CM | POA: Insufficient documentation

## 2023-09-01 DIAGNOSIS — J432 Centrilobular emphysema: Secondary | ICD-10-CM | POA: Diagnosis not present

## 2023-09-01 DIAGNOSIS — Z5112 Encounter for antineoplastic immunotherapy: Secondary | ICD-10-CM | POA: Diagnosis not present

## 2023-09-01 DIAGNOSIS — K449 Diaphragmatic hernia without obstruction or gangrene: Secondary | ICD-10-CM | POA: Insufficient documentation

## 2023-09-01 DIAGNOSIS — J439 Emphysema, unspecified: Secondary | ICD-10-CM | POA: Insufficient documentation

## 2023-09-01 DIAGNOSIS — Z79899 Other long term (current) drug therapy: Secondary | ICD-10-CM | POA: Insufficient documentation

## 2023-09-01 DIAGNOSIS — E042 Nontoxic multinodular goiter: Secondary | ICD-10-CM | POA: Insufficient documentation

## 2023-09-01 DIAGNOSIS — Z860101 Personal history of adenomatous and serrated colon polyps: Secondary | ICD-10-CM | POA: Insufficient documentation

## 2023-09-01 DIAGNOSIS — K7689 Other specified diseases of liver: Secondary | ICD-10-CM | POA: Diagnosis not present

## 2023-09-01 LAB — CMP (CANCER CENTER ONLY)
ALT: 17 U/L (ref 0–44)
AST: 21 U/L (ref 15–41)
Albumin: 3.7 g/dL (ref 3.5–5.0)
Alkaline Phosphatase: 55 U/L (ref 38–126)
Anion gap: 10 (ref 5–15)
BUN: 23 mg/dL (ref 8–23)
CO2: 28 mmol/L (ref 22–32)
Calcium: 9.4 mg/dL (ref 8.9–10.3)
Chloride: 103 mmol/L (ref 98–111)
Creatinine: 0.92 mg/dL (ref 0.44–1.00)
GFR, Estimated: >60 mL/min (ref >60)
Glucose, Bld: 93 mg/dL (ref 70–99)
Potassium: 4.5 mmol/L (ref 3.5–5.1)
Sodium: 141 mmol/L (ref 135–145)
Total Bilirubin: 0.8 mg/dL (ref 0.0–1.2)
Total Protein: 6.5 g/dL (ref 6.5–8.1)

## 2023-09-01 LAB — CBC WITH DIFFERENTIAL (CANCER CENTER ONLY)
Abs Immature Granulocytes: 0.02 K/uL (ref 0.00–0.07)
Basophils Absolute: 0 K/uL (ref 0.0–0.1)
Basophils Relative: 1 %
Eosinophils Absolute: 0.3 K/uL (ref 0.0–0.5)
Eosinophils Relative: 4 %
HCT: 41.5 % (ref 36.0–46.0)
Hemoglobin: 13.6 g/dL (ref 12.0–15.0)
Immature Granulocytes: 0 %
Lymphocytes Relative: 18 %
Lymphs Abs: 1.1 K/uL (ref 0.7–4.0)
MCH: 33.8 pg (ref 26.0–34.0)
MCHC: 32.8 g/dL (ref 30.0–36.0)
MCV: 103.2 fL — ABNORMAL HIGH (ref 80.0–100.0)
Monocytes Absolute: 0.7 K/uL (ref 0.1–1.0)
Monocytes Relative: 11 %
Neutro Abs: 4.2 K/uL (ref 1.7–7.7)
Neutrophils Relative %: 66 %
Platelet Count: 169 K/uL (ref 150–400)
RBC: 4.02 MIL/uL (ref 3.87–5.11)
RDW: 13.7 % (ref 11.5–15.5)
WBC Count: 6.3 K/uL (ref 4.0–10.5)
nRBC: 0 % (ref 0.0–0.2)

## 2023-09-01 LAB — TSH: TSH: 3.245 u[IU]/mL (ref 0.350–4.500)

## 2023-09-01 MED ORDER — SODIUM CHLORIDE 0.9 % IV SOLN
10.0000 mg/kg | Freq: Once | INTRAVENOUS | Status: AC
  Administered 2023-09-01: 500 mg via INTRAVENOUS
  Filled 2023-09-01: qty 10

## 2023-09-01 MED ORDER — SODIUM CHLORIDE 0.9 % IV SOLN
INTRAVENOUS | Status: DC
  Filled 2023-09-01: qty 250

## 2023-09-01 NOTE — Assessment & Plan Note (Signed)
 Immunotherapy plan as listed above

## 2023-09-01 NOTE — Progress Notes (Signed)
 Hematology/Oncology Progress note [phone] [phone]) [phone]     Chief Complaint:  Follow up for recurrent non small cell lung cancer   ASSESSMENT & PLAN:  Heather [name] is a 88 y.o. Heather [name] female with stage IB adenocarcinoma of the right upper lobe status post wedge resection on 03/13/2014. T2aN1M0 (stage IB).  She is s/p wedge resection of a left lower lobe mass on 03/10/2017. T1aNx (stage IA). No with locally recurrent NSCLC   Malignant neoplasm of lower lobe of left lung (HCC) #History of stage Ib right lung upper lobe adenocarcinoma status post wedge resection in 2016 History of left lower lobe adenocarcinoma T1 a NX status post wedge resection 2019. Recurrence - Biopsy of pretracheal lymph node positive for NSCLC.  MRI brain is negative.  Recommend concurrent chemotherapy [carboplatin  taxol ]  with radiation.  Labs are reviewed and discussed with patient. S/p concurrent carboplatin  AUC2, taxol  45mg /m with Radiation.   Interval CT scan showed partial response Recommend immunotherapy consolidation with Durvalumab  Q2weeks for up to a year.  Labs are reviewed and discussed with patient. Proceed with Durvalumab      Encounter for antineoplastic immunotherapy Immunotherapy plan as listed above.  Primary cancer of right upper lobe of lung [facility]) Same plan as above.    Orders Placed This Encounter  Procedures   CBC with Differential (Cancer Center Only)    Standing Status:   Future    Expected Date:   09/29/2023    Expiration Date:   09/28/2024   CMP (Cancer Center only)    Standing Status:   Future    Expected Date:   09/29/2023    Expiration Date:   09/28/2024   CBC with Differential (Cancer Center Only)    Standing Status:   Future    Expected Date:   10/13/2023    Expiration Date:   10/12/2024   CMP (Cancer Center only)    Standing Status:   Future    Expected Date:   10/13/2023    Expiration Date:   10/12/2024   T4    Standing Status:   Future    Expected Date:    10/13/2023    Expiration Date:   10/12/2024   TSH    Standing Status:   Future    Expected Date:   10/13/2023    Expiration Date:   10/12/2024   Follow up 2 weeks. All questions were answered. The patient knows to call the clinic with any problems, questions or concerns.  We spent sufficient time to discuss many aspect of care, questions were answered to patient's satisfaction.   [name], MD, PhD [facility] Hematology Oncology 09/01/2023   PERTINENT ONCOLOGY HISTORY Previously follows up with Dr.Corcoran. Estasblish care with me on 03/15/2018  Oncology History  Primary cancer of right upper lobe of lung (HCC)  03/13/2014 Initial Diagnosis   Primary cancer of right upper lobe of lung   PET scan on 02/13/2014 revealed a 1 cm right upper lobe hypermetabolic nodule (SUV 3.8) and a 6 mm right upper lobe nodule (no metabolic activity). There was no mediastianl adenopathy.  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 Pathology revealed a 1.1 cm high-grade adenocarcinoma with pleural invasion. Nodes were negative. Pathologic stage was T2aN1M0 (stage IB).     03/30/2023 Imaging   PET  1. Hypermetabolic pretracheal lymph node is concerning for metastatic adenopathy. 2. No evidence of local recurrence in the RIGHT upper lobe. 3. No evidence of metastatic  disease in the abdomen pelvis. 4. Small nodule along the LEFT oblique fissure has metabolic activity; however, nodule has been stable in size since 03/16/2021. Favor benign nodule. 5. LEFT hydronephrosis with double-J ureteral stent in place.   05/16/2023 - 06/27/2023 Chemotherapy   Patient is on Treatment Plan : Carboplatin  + Paclitaxel  Weekly X 6 Weeks with XRT     07/31/2023 Imaging   CT chest wo contrast   1. Interval decrease in size of the precarinal lymph node seen on prior studies. This lymph node is seen to be hypermetabolic on PET-CT 03/30/2023. Close continued attention warranted. 2.  Stable 8 mm retro hilar left lung nodule. Additional scattered smaller pulmonary nodules bilaterally are unchanged. 3. Stable focus of architectural distortion in the medial left upper lobe with a 5 mm nodular component. 4. Aortic Atherosclerosis (ICD10-I70.0) and Emphysema (ICD10-J43.9).   08/04/2023 -  Chemotherapy   Patient is on Treatment Plan : LUNG Durvalumab  (10) q14d     Malignant neoplasm of lower lobe of left lung (HCC)  03/10/2017 Initial Diagnosis   Malignant neoplasm of lower lobe of left lung   Chest CT on 02/10/2017 revealed clear interval progression of the posterior left lower lobe pulmonary nodule, now measuring 12 mm in long axis and having a distinctly lobular contour. Imaging features were very concerning for neoplasm.  There was no change in the 7 mm nodule posterior to the right hilum.   PET scan on 02/18/2017 revealed a 11 x 8 mm posterior left lower lobe pulmonary nodule that had mildly progressed from prior studies and demonstrated mild hypermetabolism (SUV 1.6).  The appearance was considered worrisome for an indolent primary bronchogenic neoplasm.   03/10/2017.  s/p wedge resection of a left lower lobe mass.   Pathology revealed an 8 mm invasive adenocarcinoma with solid and acinar patterns.  There was no visceral invasion.  There was no lymphovascular invasion.  All margins were negative.  Pathologic stage was T1aNx (stage IA).   03/30/2023 Imaging   PET  1. Hypermetabolic pretracheal lymph node is concerning for metastatic adenopathy. 2. No evidence of local recurrence in the RIGHT upper lobe. 3. No evidence of metastatic disease in the abdomen pelvis. 4. Small nodule along the LEFT oblique fissure has metabolic activity; however, nodule has been stable in size since 03/16/2021. Favor benign nodule. 5. LEFT hydronephrosis with double-J ureteral stent in place.   04/27/2023 Cancer Staging   Staging form: Lung, AJCC 8th Edition - Pathologic stage from 04/27/2023:  pN2, cM0 - Signed by Babara Call, MD on 04/27/2023 Stage prefix: Recurrence   05/16/2023 - 06/27/2023 Chemotherapy   Patient is on Treatment Plan : Carboplatin  + Paclitaxel  Weekly X 6 Weeks with XRT     07/31/2023 Imaging   CT chest wo contrast   1. Interval decrease in size of the precarinal lymph node seen on prior studies. This lymph node is seen to be hypermetabolic on PET-CT 03/30/2023. Close continued attention warranted. 2. Stable 8 mm retro hilar left lung nodule. Additional scattered smaller pulmonary nodules bilaterally are unchanged. 3. Stable focus of architectural distortion in the medial left upper lobe with a 5 mm nodular component. 4. Aortic Atherosclerosis (ICD10-I70.0) and Emphysema (ICD10-J43.9).   08/04/2023 -  Chemotherapy   Patient is on Treatment Plan : LUNG Durvalumab  (10) q14d       INTERVAL HISTORY Heather [name] is a 88 y.o. female who has above history reviewed by me today presents for follow up visit for management of recurrent  lung cancer, .  Patient reports feeling well.  She was accompanied by her husband Denies shortness of breath, fatigue, hemoptysis, unintentional weight loss. No new complaints.   Decreased appetite.  Past Medical History:  Diagnosis Date   Anginal pain (HCC)    Benign essential hypertension    Brain aneurysm    Carotid artery stenosis    Carotid stenosis 06/19/2013   Colonic polyp    Emphysema lung (HCC)    GERD (gastroesophageal reflux disease)    Hiatal hernia    Hypercholesteremia    Hyperlipidemia, mixed 06/09/2015   Lung cancer (HCC) 2016   Lung nodule 06/21/2014   Macrocytic 10/06/2014   Multiple thyroid  nodules    PAT (paroxysmal atrial tachycardia) (HCC) 06/19/2013   Plantar fasciitis     Past Surgical History:  Procedure Laterality Date   BREAST EXCISIONAL BIOPSY Right 40 yrs ago   neg   BREAST LUMPECTOMY     benign   CATARACT EXTRACTION W/ INTRAOCULAR LENS  IMPLANT, BILATERAL Bilateral    CEREBRAL ANEURYSM  REPAIR  2004   coil treatment   CERVIX LESION DESTRUCTION     COLONOSCOPY     1995, 2006, 2014   COLONOSCOPY WITH PROPOFOL  N/A 03/31/2015   Procedure: COLONOSCOPY WITH PROPOFOL ;  Surgeon: Lamar ONEIDA Holmes, MD;  Location: [facility] ENDOSCOPY;  Service: Endoscopy;  Laterality: N/A;   CYSTOSCOPY W/ RETROGRADES Left 03/02/2022   Procedure: CYSTOSCOPY WITH RETROGRADE PYELOGRAM;  Surgeon: Twylla Glendia BROCKS, MD;  Location: [facility] ORS;  Service: Urology;  Laterality: Left;   CYSTOSCOPY W/ RETROGRADES Left 12/28/2022   Procedure: CYSTOSCOPY WITH RETROGRADE PYELOGRAM;  Surgeon: Twylla Glendia BROCKS, MD;  Location: [facility] ORS;  Service: Urology;  Laterality: Left;   CYSTOSCOPY W/ URETERAL STENT PLACEMENT Left 03/03/2021   Procedure: CYSTOSCOPY WITH EXCHANGE;  Surgeon: Twylla Glendia BROCKS, MD;  Location: [facility] ORS;  Service: Urology;  Laterality: Left;   CYSTOSCOPY W/ URETERAL STENT PLACEMENT Left 03/02/2022   Procedure: CYSTOSCOPY WITH STENT EXCHANGE;  Surgeon: Twylla Glendia BROCKS, MD;  Location: [facility] ORS;  Service: Urology;  Laterality: Left;   CYSTOSCOPY W/ URETERAL STENT PLACEMENT Left 12/28/2022   Procedure: CYSTOSCOPY WITH STENT EXCHANGE;  Surgeon: Twylla Glendia BROCKS, MD;  Location: [facility] ORS;  Service: Urology;  Laterality: Left;   CYSTOSCOPY WITH STENT PLACEMENT Left 11/25/2020   Procedure: CYSTOSCOPY WITH STENT PLACEMENT;  Surgeon: Twylla Glendia BROCKS, MD;  Location: [facility] ORS;  Service: Urology;  Laterality: Left;   ENDOBRONCHIAL ULTRASOUND Bilateral 04/19/2023   Procedure: ENDOBRONCHIAL ULTRASOUND (EBUS);  Surgeon: Isadora Hose, MD;  Location: [facility] OR[facility];  Service: Pulmonary;  Laterality: Bilateral;   FLEXIBLE BRONCHOSCOPY N/A 03/10/2017   Procedure: FLEXIBLE BRONCHOSCOPY;  Surgeon: Volney Lye, MD;  Location: [facility] ORS;  Service: Thoracic;  Laterality: N/A;   THORACOSCOPY WITH WEDGE RESECTION LUNG Right 03/13/2014   RUL   THORACOTOMY/LOBECTOMY Left 03/10/2017   Procedure: THORACOTOMY WITH LUNG WEDGE RESECTION POSSIBLE  LOBECTOMY;  Surgeon: Volney Lye, MD;  Location: [facility] ORS;  Service: Thoracic;  Laterality: Left;   TUBAL LIGATION      Family History  Problem Relation Age of Onset   Alcohol abuse Mother    Heart attack Father    Breast cancer Neg Hx     Social History:  reports that she quit smoking about 35 years ago. Her smoking use included cigarettes. She started smoking about 70 years ago. She has a 35 pack-year smoking history. She has never used smokeless tobacco. She reports current alcohol use. She reports that she  does not use drugs.   Allergies:  Allergies  Allergen Reactions   Ace Inhibitors Swelling   Contrast Media [Iodinated Contrast Media]    Cortisone     Patient had facial swelling and itching from cortisone injection IM    Current Medications: Current Outpatient Medications  Medication Sig Dispense Refill   amLODipine  (NORVASC ) 5 MG tablet Take 5 mg by mouth at bedtime.     atenolol  (TENORMIN ) 50 MG tablet Take 50 mg by mouth at bedtime.     calcium -vitamin D  (OSCAL WITH D) 500-200 MG-UNIT tablet Take 1 tablet by mouth daily with breakfast.     cetirizine (ZYRTEC) 10 MG tablet Take 10 mg by mouth daily.     dextromethorphan-guaiFENesin  (MUCINEX  DM) 30-600 MG 12hr tablet Take 1 tablet by mouth 2 (two) times daily as needed for cough.     donepezil  (ARICEPT ) 10 MG tablet Take 1 tablet by mouth at bedtime.     fluticasone  (FLONASE ) 50 MCG/ACT nasal spray Place 1 spray into the nose daily as needed for allergies.      nystatin  (MYCOSTATIN ) 100000 UNIT/ML suspension Take 5 mLs (500,000 Units total) by mouth 4 (four) times daily. 473 mL 0   OLANZapine  (ZYPREXA ) 5 MG tablet Take 1 tablet (5 mg total) by mouth at bedtime. For appetite, sleep, and nausea 30 tablet 2   simvastatin  (ZOCOR ) 20 MG tablet Take 20 mg by mouth every morning.     trospium  (SANCTURA ) 20 MG tablet Take 1 tablet (20 mg total) by mouth 2 (two) times daily. 30 tablet 1   vitamin B-12 (CYANOCOBALAMIN ) 100 MCG  tablet Take 100 mcg by mouth daily.     sucralfate  (CARAFATE ) 1 GM/10ML suspension Take 10 mLs (1 g total) by mouth 4 (four) times daily -  with meals and at bedtime. For radiation esophagitis (Patient not taking: Reported on 09/01/2023) 420 mL 1   No current facility-administered medications for this visit.   Review of Systems  Constitutional:  Negative for appetite change, chills, fatigue and fever.  HENT:   Positive for hearing loss. Negative for voice change.   Eyes:  Negative for eye problems.  Respiratory:  Negative for chest tightness and cough.   Cardiovascular:  Negative for chest pain.  Gastrointestinal:  Negative for abdominal distention, abdominal pain, blood in stool and nausea.  Endocrine: Negative for hot flashes.  Genitourinary:  Negative for difficulty urinating and frequency.   Musculoskeletal:  Negative for arthralgias.       Chronic intermittent back pain  Skin:  Negative for itching and rash.  Neurological:  Negative for extremity weakness.  Hematological:  Negative for adenopathy.  Psychiatric/Behavioral:  Negative for confusion.     Performance status (ECOG): 0 - Asymptomatic  Vital Signs BP (!) 143/63 (BP Location: Left Arm, Patient Position: Sitting, Cuff Size: Normal)   Pulse 62   Temp (!) 96.9 F (36.1 C) (Tympanic)   Resp 18   Wt 127 lb 1.6 oz (57.7 kg)   SpO2 97%   BMI 23.25 kg/m   Physical Exam Constitutional:      General: She is not in acute distress.    Appearance: She is not diaphoretic.  HENT:     Head: Normocephalic and atraumatic.  Eyes:     General: No scleral icterus. Cardiovascular:     Rate and Rhythm: Normal rate and regular rhythm.  Pulmonary:     Effort: Pulmonary effort is normal. No respiratory distress.  Abdominal:     General: There  is no distension.     Palpations: Abdomen is soft.     Tenderness: There is no abdominal tenderness.  Musculoskeletal:        General: Normal range of motion.     Cervical back: Normal  range of motion and neck supple.  Skin:    General: Skin is dry.     Findings: No erythema.  Neurological:     Mental Status: She is alert and oriented to person, place, and time. Mental status is at baseline.  Psychiatric:        Mood and Affect: Mood and affect normal.     RADIOGRAPHIC STUDIES: I have personally reviewed the radiological images as listed and agreed with the findings in the report. CT Chest Wo Contrast Result Date: 07/31/2023 CLINICAL DATA:  Non-small-cell lung cancer. Restaging. * Tracking Code: BO * EXAM: CT CHEST WITHOUT CONTRAST TECHNIQUE: Multidetector CT imaging of the chest was performed following the standard protocol without IV contrast. RADIATION DOSE REDUCTION: This exam was performed according to the departmental dose-optimization program which includes automated exposure control, adjustment of the mA and/or kV according to patient size and/or use of iterative reconstruction technique. COMPARISON:  PET-CT 03/30/2023.  Chest CT 03/18/2023. FINDINGS: Cardiovascular: The heart size is normal. No substantial pericardial effusion. Coronary artery calcification is evident. Moderate atherosclerotic calcification is noted in the wall of the thoracic aorta. Mediastinum/Nodes: 13 mm short axis precarinal lymph node seen on prior studies is smaller in the interval measuring 10 mm short axis today on 54/2. No other mediastinal lymphadenopathy. No evidence for gross hilar lymphadenopathy although assessment is limited by the lack of intravenous contrast on the current study. Tiny hiatal hernia. The esophagus has normal imaging features. There is no axillary lymphadenopathy. Lungs/Pleura: Centrilobular and paraseptal emphysema evident. Biapical pleuroparenchymal scarring evident. Focus of architectural distortion in the medial left upper lobe a nodular component measured previously at 5 mm stable at 5 mm today (29/4). 8 mm retro hilar left lung nodule measured previously is stable at 8  mm today (53/4). Subtle ground-glass opacity posterior right lower lobe stable on 78/4. No new suspicious pulmonary nodule or mass. No focal airspace consolidation. No pleural effusion. Surgical scarring noted posterior left lower lobe, stable. Upper Abdomen: Scattered tiny hypodensities in the liver parenchyma are too small to characterize but are statistically most likely benign. No followup imaging is recommended. Musculoskeletal: No worrisome lytic or sclerotic osseous abnormality. IMPRESSION: 1. Interval decrease in size of the precarinal lymph node seen on prior studies. This lymph node is seen to be hypermetabolic on PET-CT 03/30/2023. Close continued attention warranted. 2. Stable 8 mm retro hilar left lung nodule. Additional scattered smaller pulmonary nodules bilaterally are unchanged. 3. Stable focus of architectural distortion in the medial left upper lobe with a 5 mm nodular component. 4. Aortic Atherosclerosis (ICD10-I70.0) and Emphysema (ICD10-J43.9). Electronically Signed   By: Camellia Candle M.D.   On: 07/31/2023 10:28   CT ABDOMEN PELVIS WO CONTRAST Result Date: 07/11/2023 CLINICAL DATA:  Status post chemotherapy for lung cancer. Progressive abdominal pain. Change in bowel habits. Urinary incontinence. * Tracking Code: BO * EXAM: CT ABDOMEN AND PELVIS WITHOUT CONTRAST TECHNIQUE: Multidetector CT imaging of the abdomen and pelvis was performed following the standard protocol without IV contrast. RADIATION DOSE REDUCTION: This exam was performed according to the departmental dose-optimization program which includes automated exposure control, adjustment of the mA and/or kV according to patient size and/or use of iterative reconstruction technique. COMPARISON:  05/28/2023 FINDINGS: Lower chest:  Left lower lobe surgical sutures. Normal heart size without pericardial or pleural effusion. Tiny hiatal hernia. Hepatobiliary: Hepatic cysts and too small to characterize lesions, suboptimally evaluated on  noncontrast exam. Normal gallbladder, without biliary ductal dilatation. Pancreas: Normal pancreas, without duct dilatation. Foci of hyperattenuation within the expected location of the distal common duct including on 29/2 are present previously. Spleen: Normal in size, without focal abnormality. Adrenals/Urinary Tract: Normal adrenal glands. Moderate left-sided hydronephrosis is unchanged. Ureteric stent originates in a left extrarenal pelvis. Similar mild edema surrounds the left renal pelvis. The stent terminates in the left side of the urinary bladder. No bladder calculi. Stomach/Bowel: Normal remainder of the stomach. Moderate stool in the rectum. Extensive colonic diverticulosis. Sigmoid diverticulitis has resolved. Normal terminal ileum and appendix. Normal small bowel. Vascular/Lymphatic: Advanced aortic and branch vessel atherosclerosis. No abdominopelvic adenopathy. Reproductive: Normal uterus and adnexa. Other: No significant free fluid.  No free intraperitoneal air. Musculoskeletal: Osteopenia. Tarlov cysts. IMPRESSION: 1. Resolved sigmoid diverticulitis. 2. Similar appearance of moderate left-sided hydronephrosis with ureteric stent originating in the an extrarenal pelvis. Similar mild surrounding edema for which ascending infection cannot be excluded. Alternatively, this edema could simply be secondary to chronic hydronephrosis. 3. No other explanation for acute pain. 4. Stool in the rectum suggests constipation or even mild fecal impaction. 5. Hyperattenuation in the region of the distal common duct has been present back to at least 2022, favoring vascular calcification. Nonobstructive choledocholithiasis felt unlikely. 6. Incidental findings, including: Tiny hiatal hernia. Aortic Atherosclerosis (ICD10-I70.0). Electronically Signed   By: Rockey Kilts M.D.   On: 07/11/2023 13:44    Laboratory results were reviewed by me    Latest Ref Rng & Units 09/01/2023   10:23 AM 08/18/2023   10:56 AM  08/04/2023   10:30 AM  CBC  WBC 4.0 - 10.5 K/uL 6.3  4.8  5.8   Hemoglobin 12.0 - 15.0 g/dL 86.3  86.9  87.0   Hematocrit 36.0 - 46.0 % 41.5  39.2  39.7   Platelets 150 - 400 K/uL 169  195  175       Latest Ref Rng & Units 09/01/2023   10:23 AM 08/18/2023   10:56 AM 08/04/2023   10:31 AM  CMP  Glucose 70 - 99 mg/dL 93  95  898   BUN 8 - 23 mg/dL 23  20  20    Creatinine 0.44 - 1.00 mg/dL 9.07  9.23  9.16   Sodium 135 - 145 mmol/L 141  137  137   Potassium 3.5 - 5.1 mmol/L 4.5  4.0  4.2   Chloride 98 - 111 mmol/L 103  104  106   CO2 22 - 32 mmol/L 28  24  25    Calcium  8.9 - 10.3 mg/dL 9.4  9.1  9.1   Total Protein 6.5 - 8.1 g/dL 6.5  6.6  6.5   Total Bilirubin 0.0 - 1.2 mg/dL 0.8  0.5  0.5   Alkaline Phos 38 - 126 U/L 55  70  66   AST 15 - 41 U/L 21  21  27    ALT 0 - 44 U/L 17  15  20

## 2023-09-01 NOTE — Assessment & Plan Note (Signed)
Same plan as above.  

## 2023-09-01 NOTE — Progress Notes (Signed)
 Per Dr. Babara Belton to continue with imfinzi  500 mg dose despite weight gain   Maudie FORBES Andreas, PharmD, BCPS Clinical Pharmacist

## 2023-09-01 NOTE — Assessment & Plan Note (Addendum)
#  History of stage Ib right lung upper lobe adenocarcinoma status post wedge resection in 2016 History of left lower lobe adenocarcinoma T1 a NX status post wedge resection 2019. Recurrence - Biopsy of pretracheal lymph node positive for NSCLC.  MRI brain is negative.  Recommend concurrent chemotherapy [carboplatin  taxol ]  with radiation.  Labs are reviewed and discussed with patient. S/p concurrent carboplatin  AUC2, taxol  45mg /m with Radiation.   Interval CT scan showed partial response Recommend immunotherapy consolidation with Durvalumab  Q2weeks for up to a year.  Labs are reviewed and discussed with patient. Proceed with Durvalumab

## 2023-09-02 ENCOUNTER — Other Ambulatory Visit: Payer: Self-pay

## 2023-09-02 LAB — T4: T4, Total: 5.6 ug/dL (ref 4.5–12.0)

## 2023-09-15 ENCOUNTER — Inpatient Hospital Stay

## 2023-09-15 ENCOUNTER — Encounter: Payer: Self-pay | Admitting: Oncology

## 2023-09-15 ENCOUNTER — Inpatient Hospital Stay (HOSPITAL_BASED_OUTPATIENT_CLINIC_OR_DEPARTMENT_OTHER): Admitting: Oncology

## 2023-09-15 VITALS — BP 117/71

## 2023-09-15 VITALS — BP 118/60 | HR 60 | Temp 97.2°F | Resp 18 | Ht 62.0 in | Wt 128.0 lb

## 2023-09-15 DIAGNOSIS — C3411 Malignant neoplasm of upper lobe, right bronchus or lung: Secondary | ICD-10-CM

## 2023-09-15 DIAGNOSIS — Z5112 Encounter for antineoplastic immunotherapy: Secondary | ICD-10-CM

## 2023-09-15 DIAGNOSIS — C3432 Malignant neoplasm of lower lobe, left bronchus or lung: Secondary | ICD-10-CM

## 2023-09-15 LAB — CBC WITH DIFFERENTIAL (CANCER CENTER ONLY)
Abs Immature Granulocytes: 0.01 K/uL (ref 0.00–0.07)
Basophils Absolute: 0 K/uL (ref 0.0–0.1)
Basophils Relative: 0 %
Eosinophils Absolute: 0.2 K/uL (ref 0.0–0.5)
Eosinophils Relative: 4 %
HCT: 41.1 % (ref 36.0–46.0)
Hemoglobin: 13.7 g/dL (ref 12.0–15.0)
Immature Granulocytes: 0 %
Lymphocytes Relative: 19 %
Lymphs Abs: 1.1 K/uL (ref 0.7–4.0)
MCH: 33.5 pg (ref 26.0–34.0)
MCHC: 33.3 g/dL (ref 30.0–36.0)
MCV: 100.5 fL — ABNORMAL HIGH (ref 80.0–100.0)
Monocytes Absolute: 0.6 K/uL (ref 0.1–1.0)
Monocytes Relative: 10 %
Neutro Abs: 3.9 K/uL (ref 1.7–7.7)
Neutrophils Relative %: 67 %
Platelet Count: 178 K/uL (ref 150–400)
RBC: 4.09 MIL/uL (ref 3.87–5.11)
RDW: 12.7 % (ref 11.5–15.5)
WBC Count: 5.8 K/uL (ref 4.0–10.5)
nRBC: 0 % (ref 0.0–0.2)

## 2023-09-15 LAB — CMP (CANCER CENTER ONLY)
ALT: 16 U/L (ref 0–44)
AST: 22 U/L (ref 15–41)
Albumin: 3.6 g/dL (ref 3.5–5.0)
Alkaline Phosphatase: 59 U/L (ref 38–126)
Anion gap: 6 (ref 5–15)
BUN: 23 mg/dL (ref 8–23)
CO2: 25 mmol/L (ref 22–32)
Calcium: 9.1 mg/dL (ref 8.9–10.3)
Chloride: 107 mmol/L (ref 98–111)
Creatinine: 0.87 mg/dL (ref 0.44–1.00)
GFR, Estimated: >60 mL/min (ref >60)
Glucose, Bld: 107 mg/dL — ABNORMAL HIGH (ref 70–99)
Potassium: 4.1 mmol/L (ref 3.5–5.1)
Sodium: 138 mmol/L (ref 135–145)
Total Bilirubin: 0.7 mg/dL (ref 0.0–1.2)
Total Protein: 6.6 g/dL (ref 6.5–8.1)

## 2023-09-15 MED ORDER — SODIUM CHLORIDE 0.9 % IV SOLN
INTRAVENOUS | Status: DC
  Filled 2023-09-15: qty 250

## 2023-09-15 MED ORDER — SODIUM CHLORIDE 0.9 % IV SOLN
10.0000 mg/kg | Freq: Once | INTRAVENOUS | Status: AC
  Administered 2023-09-15: 500 mg via INTRAVENOUS
  Filled 2023-09-15: qty 10

## 2023-09-15 NOTE — Assessment & Plan Note (Signed)
Same plan as above.  

## 2023-09-15 NOTE — Assessment & Plan Note (Signed)
#  History of stage Ib right lung upper lobe adenocarcinoma status post wedge resection in 2016 History of left lower lobe adenocarcinoma T1 a NX status post wedge resection 2019. Recurrence - Biopsy of pretracheal lymph node positive for NSCLC.  MRI brain is negative.  Recommend concurrent chemotherapy [carboplatin  taxol ]  with radiation.  Labs are reviewed and discussed with patient. S/p concurrent carboplatin  AUC2, taxol  45mg /m with Radiation.   Interval CT scan showed partial response Recommend immunotherapy consolidation with Durvalumab  Q2weeks for up to a year.  Labs are reviewed and discussed with patient. Proceed with Durvalumab

## 2023-09-15 NOTE — Progress Notes (Signed)
 Hematology/Oncology Progress note [phone] [phone]     Chief Complaint:  Follow up for recurrent non small cell lung cancer   ASSESSMENT & PLAN:  Heather [name] is a 88 y.o. Heather [name] female with stage IB adenocarcinoma of the right upper lobe status post wedge resection on 03/13/2014. T2aN1M0 (stage IB).  She is s/p wedge resection of a left lower lobe mass on 03/10/2017. T1aNx (stage IA). No with locally recurrent NSCLC   Malignant neoplasm of lower lobe of left lung (HCC) #History of stage Ib right lung upper lobe adenocarcinoma status post wedge resection in 2016 History of left lower lobe adenocarcinoma T1 a NX status post wedge resection 2019. Recurrence - Biopsy of pretracheal lymph node positive for NSCLC.  MRI brain is negative.  Recommend concurrent chemotherapy [carboplatin  taxol ]  with radiation.  Labs are reviewed and discussed with patient. S/p concurrent carboplatin  AUC2, taxol  45mg /m with Radiation.   Interval CT scan showed partial response Recommend immunotherapy consolidation with Durvalumab  Q2weeks for up to a year.  Labs are reviewed and discussed with patient. Proceed with Durvalumab      Encounter for antineoplastic immunotherapy Immunotherapy plan as listed above.  Primary cancer of right upper lobe of lung [facility]) Same plan as above.    No orders of the defined types were placed in this encounter.  Follow up 2 weeks. All questions were answered. The patient knows to call the clinic with any problems, questions or concerns.  We spent sufficient time to discuss many aspect of care, questions were answered to patient's satisfaction.   [name], MD, PhD [facility] Hematology Oncology 09/15/2023   PERTINENT ONCOLOGY HISTORY Previously follows up with Dr.Corcoran. Estasblish care with me on 03/15/2018  Oncology History  Primary cancer of right upper lobe of lung (HCC)  03/13/2014 Initial Diagnosis   Primary cancer of right upper lobe  of lung   PET scan on 02/13/2014 revealed a 1 cm right upper lobe hypermetabolic nodule (SUV 3.8) and a 6 mm right upper lobe nodule (no metabolic activity). There was no mediastianl adenopathy.  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 Pathology revealed a 1.1 cm high-grade adenocarcinoma with pleural invasion. Nodes were negative. Pathologic stage was T2aN1M0 (stage IB).     03/30/2023 Imaging   PET  1. Hypermetabolic pretracheal lymph node is concerning for metastatic adenopathy. 2. No evidence of local recurrence in the RIGHT upper lobe. 3. No evidence of metastatic disease in the abdomen pelvis. 4. Small nodule along the LEFT oblique fissure has metabolic activity; however, nodule has been stable in size since 03/16/2021. Favor benign nodule. 5. LEFT hydronephrosis with double-J ureteral stent in place.   05/16/2023 - 06/27/2023 Chemotherapy   Patient is on Treatment Plan : Carboplatin  + Paclitaxel  Weekly X 6 Weeks with XRT     07/31/2023 Imaging   CT chest wo contrast   1. Interval decrease in size of the precarinal lymph node seen on prior studies. This lymph node is seen to be hypermetabolic on PET-CT 03/30/2023. Close continued attention warranted. 2. Stable 8 mm retro hilar left lung nodule. Additional scattered smaller pulmonary nodules bilaterally are unchanged. 3. Stable focus of architectural distortion in the medial left upper lobe with a 5 mm nodular component. 4. Aortic Atherosclerosis (ICD10-I70.0) and Emphysema (ICD10-J43.9).   08/04/2023 -  Chemotherapy   Patient is on Treatment Plan : LUNG Durvalumab  (10) q14d     Malignant neoplasm of lower lobe of left  lung (HCC)  03/10/2017 Initial Diagnosis   Malignant neoplasm of lower lobe of left lung   Chest CT on 02/10/2017 revealed clear interval progression of the posterior left lower lobe pulmonary nodule, now measuring 12 mm in long axis and having a distinctly lobular  contour. Imaging features were very concerning for neoplasm.  There was no change in the 7 mm nodule posterior to the right hilum.   PET scan on 02/18/2017 revealed a 11 x 8 mm posterior left lower lobe pulmonary nodule that had mildly progressed from prior studies and demonstrated mild hypermetabolism (SUV 1.6).  The appearance was considered worrisome for an indolent primary bronchogenic neoplasm.   03/10/2017.  s/p wedge resection of a left lower lobe mass.   Pathology revealed an 8 mm invasive adenocarcinoma with solid and acinar patterns.  There was no visceral invasion.  There was no lymphovascular invasion.  All margins were negative.  Pathologic stage was T1aNx (stage IA).   03/30/2023 Imaging   PET  1. Hypermetabolic pretracheal lymph node is concerning for metastatic adenopathy. 2. No evidence of local recurrence in the RIGHT upper lobe. 3. No evidence of metastatic disease in the abdomen pelvis. 4. Small nodule along the LEFT oblique fissure has metabolic activity; however, nodule has been stable in size since 03/16/2021. Favor benign nodule. 5. LEFT hydronephrosis with double-J ureteral stent in place.   04/27/2023 Cancer Staging   Staging form: Lung, AJCC 8th Edition - Pathologic stage from 04/27/2023: pN2, cM0 - Signed by Babara Call, MD on 04/27/2023 Stage prefix: Recurrence   05/16/2023 - 06/27/2023 Chemotherapy   Patient is on Treatment Plan : Carboplatin  + Paclitaxel  Weekly X 6 Weeks with XRT     07/31/2023 Imaging   CT chest wo contrast   1. Interval decrease in size of the precarinal lymph node seen on prior studies. This lymph node is seen to be hypermetabolic on PET-CT 03/30/2023. Close continued attention warranted. 2. Stable 8 mm retro hilar left lung nodule. Additional scattered smaller pulmonary nodules bilaterally are unchanged. 3. Stable focus of architectural distortion in the medial left upper lobe with a 5 mm nodular component. 4. Aortic Atherosclerosis (ICD10-I70.0)  and Emphysema (ICD10-J43.9).   08/04/2023 -  Chemotherapy   Patient is on Treatment Plan : LUNG Durvalumab  (10) q14d       INTERVAL HISTORY Heather [name] is a 88 y.o. female who has above history reviewed by me today presents for follow up visit for management of recurrent lung cancer, .  Patient reports feeling well.  She was accompanied by her husband Denies shortness of breath, fatigue, hemoptysis, unintentional weight loss. No new complaints.     Past Medical History:  Diagnosis Date   Anginal pain (HCC)    Benign essential hypertension    Brain aneurysm    Carotid artery stenosis    Carotid stenosis 06/19/2013   Colonic polyp    Emphysema lung (HCC)    GERD (gastroesophageal reflux disease)    Hiatal hernia    Hypercholesteremia    Hyperlipidemia, mixed 06/09/2015   Lung cancer (HCC) 2016   Lung nodule 06/21/2014   Macrocytic 10/06/2014   Multiple thyroid  nodules    PAT (paroxysmal atrial tachycardia) (HCC) 06/19/2013   Plantar fasciitis     Past Surgical History:  Procedure Laterality Date   BREAST EXCISIONAL BIOPSY Right 40 yrs ago   neg   BREAST LUMPECTOMY     benign   CATARACT EXTRACTION W/ INTRAOCULAR LENS  IMPLANT, BILATERAL Bilateral  CEREBRAL ANEURYSM REPAIR  2004   coil treatment   CERVIX LESION DESTRUCTION     COLONOSCOPY     1995, 2006, 2014   COLONOSCOPY WITH PROPOFOL  N/A 03/31/2015   Procedure: COLONOSCOPY WITH PROPOFOL ;  Surgeon: Lamar ONEIDA Holmes, MD;  Location: [facility] ENDOSCOPY;  Service: Endoscopy;  Laterality: N/A;   CYSTOSCOPY W/ RETROGRADES Left 03/02/2022   Procedure: CYSTOSCOPY WITH RETROGRADE PYELOGRAM;  Surgeon: Twylla Glendia BROCKS, MD;  Location: [facility] ORS;  Service: Urology;  Laterality: Left;   CYSTOSCOPY W/ RETROGRADES Left 12/28/2022   Procedure: CYSTOSCOPY WITH RETROGRADE PYELOGRAM;  Surgeon: Twylla Glendia BROCKS, MD;  Location: [facility] ORS;  Service: Urology;  Laterality: Left;   CYSTOSCOPY W/ URETERAL STENT PLACEMENT Left 03/03/2021    Procedure: CYSTOSCOPY WITH EXCHANGE;  Surgeon: Twylla Glendia BROCKS, MD;  Location: [facility] ORS;  Service: Urology;  Laterality: Left;   CYSTOSCOPY W/ URETERAL STENT PLACEMENT Left 03/02/2022   Procedure: CYSTOSCOPY WITH STENT EXCHANGE;  Surgeon: Twylla Glendia BROCKS, MD;  Location: [facility] ORS;  Service: Urology;  Laterality: Left;   CYSTOSCOPY W/ URETERAL STENT PLACEMENT Left 12/28/2022   Procedure: CYSTOSCOPY WITH STENT EXCHANGE;  Surgeon: Twylla Glendia BROCKS, MD;  Location: [facility] ORS;  Service: Urology;  Laterality: Left;   CYSTOSCOPY WITH STENT PLACEMENT Left 11/25/2020   Procedure: CYSTOSCOPY WITH STENT PLACEMENT;  Surgeon: Twylla Glendia BROCKS, MD;  Location: [facility] ORS;  Service: Urology;  Laterality: Left;   ENDOBRONCHIAL ULTRASOUND Bilateral 04/19/2023   Procedure: ENDOBRONCHIAL ULTRASOUND (EBUS);  Surgeon: Isadora Hose, MD;  Location: [facility] ORS;  Service: Pulmonary;  Laterality: Bilateral;   FLEXIBLE BRONCHOSCOPY N/A 03/10/2017   Procedure: FLEXIBLE BRONCHOSCOPY;  Surgeon: Volney Lye, MD;  Location: [facility] ORS;  Service: Thoracic;  Laterality: N/A;   THORACOSCOPY WITH WEDGE RESECTION LUNG Right 03/13/2014   RUL   THORACOTOMY/LOBECTOMY Left 03/10/2017   Procedure: THORACOTOMY WITH LUNG WEDGE RESECTION POSSIBLE LOBECTOMY;  Surgeon: Volney Lye, MD;  Location: [facility] ORS;  Service: Thoracic;  Laterality: Left;   TUBAL LIGATION      Family History  Problem Relation Age of Onset   Alcohol abuse Mother    Heart attack Father    Breast cancer Neg Hx     Social History:  reports that she quit smoking about 35 years ago. Her smoking use included cigarettes. She started smoking about 70 years ago. She has a 35 pack-year smoking history. She has never used smokeless tobacco. She reports current alcohol use. She reports that she does not use drugs.   Allergies:  Allergies  Allergen Reactions   Ace Inhibitors Swelling   Contrast Media [Iodinated Contrast Media]    Cortisone     Patient had facial swelling and  itching from cortisone injection IM    Current Medications: Current Outpatient Medications  Medication Sig Dispense Refill   amLODipine  (NORVASC ) 5 MG tablet Take 5 mg by mouth at bedtime.     atenolol  (TENORMIN ) 50 MG tablet Take 50 mg by mouth at bedtime.     calcium -vitamin D  (OSCAL WITH D) 500-200 MG-UNIT tablet Take 1 tablet by mouth daily with breakfast.     cetirizine (ZYRTEC) 10 MG tablet Take 10 mg by mouth daily.     dextromethorphan-guaiFENesin  (MUCINEX  DM) 30-600 MG 12hr tablet Take 1 tablet by mouth 2 (two) times daily as needed for cough.     donepezil  (ARICEPT ) 10 MG tablet Take 1 tablet by mouth at bedtime.     fluticasone  (FLONASE ) 50 MCG/ACT nasal spray Place 1 spray into the nose daily  as needed for allergies.      nystatin  (MYCOSTATIN ) 100000 UNIT/ML suspension Take 5 mLs (500,000 Units total) by mouth 4 (four) times daily. 473 mL 0   OLANZapine  (ZYPREXA ) 5 MG tablet Take 1 tablet (5 mg total) by mouth at bedtime. For appetite, sleep, and nausea 30 tablet 2   simvastatin  (ZOCOR ) 20 MG tablet Take 20 mg by mouth every morning.     sucralfate  (CARAFATE ) 1 GM/10ML suspension Take 10 mLs (1 g total) by mouth 4 (four) times daily -  with meals and at bedtime. For radiation esophagitis 420 mL 1   trospium  (SANCTURA ) 20 MG tablet Take 1 tablet (20 mg total) by mouth 2 (two) times daily. 30 tablet 1   vitamin B-12 (CYANOCOBALAMIN ) 100 MCG tablet Take 100 mcg by mouth daily.     No current facility-administered medications for this visit.   Facility-Administered Medications Ordered in Other Visits  Medication Dose Route Frequency Provider Last Rate Last Admin   0.9 %  sodium chloride  infusion   Intravenous Continuous Babara Call, MD   Stopped at 09/15/23 1536   Review of Systems  Constitutional:  Negative for appetite change, chills, fatigue and fever.  HENT:   Positive for hearing loss. Negative for voice change.   Eyes:  Negative for eye problems.  Respiratory:  Negative for  chest tightness and cough.   Cardiovascular:  Negative for chest pain.  Gastrointestinal:  Negative for abdominal distention, abdominal pain, blood in stool and nausea.  Endocrine: Negative for hot flashes.  Genitourinary:  Negative for difficulty urinating and frequency.   Musculoskeletal:  Negative for arthralgias.       Chronic intermittent back pain  Skin:  Negative for itching and rash.  Neurological:  Negative for extremity weakness.  Hematological:  Negative for adenopathy.  Psychiatric/Behavioral:  Negative for confusion.     Performance status (ECOG): 0 - Asymptomatic  Vital Signs BP 118/60 (BP Location: Left Arm, Patient Position: Sitting, Cuff Size: Normal)   Pulse 60   Temp (!) 97.2 F (36.2 C) (Tympanic)   Resp 18   Ht 5' 2 (1.575 m)   Wt 128 lb (58.1 kg)   SpO2 100%   BMI 23.41 kg/m   Physical Exam Constitutional:      General: She is not in acute distress.    Appearance: She is not diaphoretic.  HENT:     Head: Normocephalic and atraumatic.  Eyes:     General: No scleral icterus. Cardiovascular:     Rate and Rhythm: Normal rate and regular rhythm.  Pulmonary:     Effort: Pulmonary effort is normal. No respiratory distress.  Abdominal:     General: There is no distension.     Palpations: Abdomen is soft.     Tenderness: There is no abdominal tenderness.  Musculoskeletal:        General: Normal range of motion.     Cervical back: Normal range of motion and neck supple.  Skin:    General: Skin is dry.     Findings: No erythema.  Neurological:     Mental Status: She is alert and oriented to person, place, and time. Mental status is at baseline.  Psychiatric:        Mood and Affect: Mood and affect normal.     RADIOGRAPHIC STUDIES: I have personally reviewed the radiological images as listed and agreed with the findings in the report. CT Chest Wo Contrast Result Date: 07/31/2023 CLINICAL DATA:  Non-small-cell lung cancer. Restaging. *  Tracking  Code: BO * EXAM: CT CHEST WITHOUT CONTRAST TECHNIQUE: Multidetector CT imaging of the chest was performed following the standard protocol without IV contrast. RADIATION DOSE REDUCTION: This exam was performed according to the departmental dose-optimization program which includes automated exposure control, adjustment of the mA and/or kV according to patient size and/or use of iterative reconstruction technique. COMPARISON:  PET-CT 03/30/2023.  Chest CT 03/18/2023. FINDINGS: Cardiovascular: The heart size is normal. No substantial pericardial effusion. Coronary artery calcification is evident. Moderate atherosclerotic calcification is noted in the wall of the thoracic aorta. Mediastinum/Nodes: 13 mm short axis precarinal lymph node seen on prior studies is smaller in the interval measuring 10 mm short axis today on 54/2. No other mediastinal lymphadenopathy. No evidence for gross hilar lymphadenopathy although assessment is limited by the lack of intravenous contrast on the current study. Tiny hiatal hernia. The esophagus has normal imaging features. There is no axillary lymphadenopathy. Lungs/Pleura: Centrilobular and paraseptal emphysema evident. Biapical pleuroparenchymal scarring evident. Focus of architectural distortion in the medial left upper lobe a nodular component measured previously at 5 mm stable at 5 mm today (29/4). 8 mm retro hilar left lung nodule measured previously is stable at 8 mm today (53/4). Subtle ground-glass opacity posterior right lower lobe stable on 78/4. No new suspicious pulmonary nodule or mass. No focal airspace consolidation. No pleural effusion. Surgical scarring noted posterior left lower lobe, stable. Upper Abdomen: Scattered tiny hypodensities in the liver parenchyma are too small to characterize but are statistically most likely benign. No followup imaging is recommended. Musculoskeletal: No worrisome lytic or sclerotic osseous abnormality. IMPRESSION: 1. Interval decrease in  size of the precarinal lymph node seen on prior studies. This lymph node is seen to be hypermetabolic on PET-CT 03/30/2023. Close continued attention warranted. 2. Stable 8 mm retro hilar left lung nodule. Additional scattered smaller pulmonary nodules bilaterally are unchanged. 3. Stable focus of architectural distortion in the medial left upper lobe with a 5 mm nodular component. 4. Aortic Atherosclerosis (ICD10-I70.0) and Emphysema (ICD10-J43.9). Electronically Signed   By: Camellia Candle M.D.   On: 07/31/2023 10:28   CT ABDOMEN PELVIS WO CONTRAST Result Date: 07/11/2023 CLINICAL DATA:  Status post chemotherapy for lung cancer. Progressive abdominal pain. Change in bowel habits. Urinary incontinence. * Tracking Code: BO * EXAM: CT ABDOMEN AND PELVIS WITHOUT CONTRAST TECHNIQUE: Multidetector CT imaging of the abdomen and pelvis was performed following the standard protocol without IV contrast. RADIATION DOSE REDUCTION: This exam was performed according to the departmental dose-optimization program which includes automated exposure control, adjustment of the mA and/or kV according to patient size and/or use of iterative reconstruction technique. COMPARISON:  05/28/2023 FINDINGS: Lower chest: Left lower lobe surgical sutures. Normal heart size without pericardial or pleural effusion. Tiny hiatal hernia. Hepatobiliary: Hepatic cysts and too small to characterize lesions, suboptimally evaluated on noncontrast exam. Normal gallbladder, without biliary ductal dilatation. Pancreas: Normal pancreas, without duct dilatation. Foci of hyperattenuation within the expected location of the distal common duct including on 29/2 are present previously. Spleen: Normal in size, without focal abnormality. Adrenals/Urinary Tract: Normal adrenal glands. Moderate left-sided hydronephrosis is unchanged. Ureteric stent originates in a left extrarenal pelvis. Similar mild edema surrounds the left renal pelvis. The stent terminates in the  left side of the urinary bladder. No bladder calculi. Stomach/Bowel: Normal remainder of the stomach. Moderate stool in the rectum. Extensive colonic diverticulosis. Sigmoid diverticulitis has resolved. Normal terminal ileum and appendix. Normal small bowel. Vascular/Lymphatic: Advanced aortic and branch vessel atherosclerosis.  No abdominopelvic adenopathy. Reproductive: Normal uterus and adnexa. Other: No significant free fluid.  No free intraperitoneal air. Musculoskeletal: Osteopenia. Tarlov cysts. IMPRESSION: 1. Resolved sigmoid diverticulitis. 2. Similar appearance of moderate left-sided hydronephrosis with ureteric stent originating in the an extrarenal pelvis. Similar mild surrounding edema for which ascending infection cannot be excluded. Alternatively, this edema could simply be secondary to chronic hydronephrosis. 3. No other explanation for acute pain. 4. Stool in the rectum suggests constipation or even mild fecal impaction. 5. Hyperattenuation in the region of the distal common duct has been present back to at least 2022, favoring vascular calcification. Nonobstructive choledocholithiasis felt unlikely. 6. Incidental findings, including: Tiny hiatal hernia. Aortic Atherosclerosis (ICD10-I70.0). Electronically Signed   By: Rockey Kilts M.D.   On: 07/11/2023 13:44    Laboratory results were reviewed by me    Latest Ref Rng & Units 09/15/2023    1:04 PM 09/01/2023   10:23 AM 08/18/2023   10:56 AM  CBC  WBC 4.0 - 10.5 K/uL 5.8  6.3  4.8   Hemoglobin 12.0 - 15.0 g/dL 86.2  86.3  86.9   Hematocrit 36.0 - 46.0 % 41.1  41.5  39.2   Platelets 150 - 400 K/uL 178  169  195       Latest Ref Rng & Units 09/15/2023    1:03 PM 09/01/2023   10:23 AM 08/18/2023   10:56 AM  CMP  Glucose 70 - 99 mg/dL 892  93  95   BUN 8 - 23 mg/dL 23  23  20    Creatinine 0.44 - 1.00 mg/dL 9.12  9.07  9.23   Sodium 135 - 145 mmol/L 138  141  137   Potassium 3.5 - 5.1 mmol/L 4.1  4.5  4.0   Chloride 98 - 111 mmol/L 107   103  104   CO2 22 - 32 mmol/L 25  28  24    Calcium  8.9 - 10.3 mg/dL 9.1  9.4  9.1   Total Protein 6.5 - 8.1 g/dL 6.6  6.5  6.6   Total Bilirubin 0.0 - 1.2 mg/dL 0.7  0.8  0.5   Alkaline Phos 38 - 126 U/L 59  55  70   AST 15 - 41 U/L 22  21  21    ALT 0 - 44 U/L 16  17  15

## 2023-09-15 NOTE — Assessment & Plan Note (Signed)
 Immunotherapy plan as listed above

## 2023-09-15 NOTE — Patient Instructions (Signed)
 [facility] - A DEPT OF [facility]  Discharge Instructions: Thank you for choosing [facility] to provide your oncology and hematology care.  If you have a lab appointment with the Cancer Center, please go directly to the Cancer Center and check in at the registration area.  Wear comfortable clothing and clothing appropriate for easy access to any Portacath or PICC line.   We strive to give you quality time with your provider. You may need to reschedule your appointment if you arrive late (15 or more minutes).  Arriving late affects you and other patients whose appointments are after yours.  Also, if you miss three or more appointments without notifying the office, you may be dismissed from the clinic at the provider's discretion.      For prescription refill requests, have your pharmacy contact our office and allow 72 hours for refills to be completed.    Today you received the following chemotherapy and/or immunotherapy agents IMFINZI      To help prevent nausea and vomiting after your treatment, we encourage you to take your nausea medication as directed.  BELOW ARE SYMPTOMS THAT SHOULD BE REPORTED IMMEDIATELY: *FEVER GREATER THAN 100.4 F (38 C) OR HIGHER *CHILLS OR SWEATING *NAUSEA AND VOMITING THAT IS NOT CONTROLLED WITH YOUR NAUSEA MEDICATION *UNUSUAL SHORTNESS OF BREATH *UNUSUAL BRUISING OR BLEEDING *URINARY PROBLEMS (pain or burning when urinating, or frequent urination) *BOWEL PROBLEMS (unusual diarrhea, constipation, pain near the anus) TENDERNESS IN MOUTH AND THROAT WITH OR WITHOUT PRESENCE OF ULCERS (sore throat, sores in mouth, or a toothache) UNUSUAL RASH, SWELLING OR PAIN  UNUSUAL VAGINAL DISCHARGE OR ITCHING   Items with * indicate a potential emergency and should be followed up as soon as possible or go to the Emergency Department if any problems should occur.  Please show the CHEMOTHERAPY ALERT CARD or IMMUNOTHERAPY  ALERT CARD at check-in to the Emergency Department and triage nurse.  Should you have questions after your visit or need to cancel or reschedule your appointment, please contact [facility] - A DEPT OF [facility]  [phone] and follow the prompts.  Office hours are 8:00 a.m. to 4:30 p.m. Monday - Friday. Please note that voicemails left after 4:00 p.m. may not be returned until the following business day.  We are closed weekends and major holidays. You have access to a nurse at all times for urgent questions. Please call the main number to the clinic [phone] and follow the prompts.  For any non-urgent questions, you may also contact your provider using MyChart. We now offer e-Visits for anyone 30 and older to request care online for non-urgent symptoms. For details visit [email or web address].   Also download the MyChart app! Go to the app store, search "MyChart", open the app, select [facility], and log in with your MyChart username and password.  Durvalumab Injection What is this medication? DURVALUMAB (dur VAL ue mab) treats some types of cancer. It works by helping your immune system slow or stop the spread of cancer cells. It is a monoclonal antibody. This medicine may be used for other purposes; ask your health care provider or pharmacist if you have questions. COMMON BRAND NAME(S): IMFINZI What should I tell my care team before I take this medication? They need to know if you have any of these conditions: Allogeneic stem cell transplant (uses someone else's stem cells) Autoimmune diseases, such as Crohn disease, ulcerative  colitis, lupus History of chest radiation Nervous system problems, such as Guillain-Barre syndrome, myasthenia gravis Organ transplant An unusual or allergic reaction to durvalumab, other medications, foods, dyes, or preservatives Pregnant or trying to get pregnant Breast-feeding How should I use this medication? This  medication is infused into a vein. It is given by your care team in a hospital or clinic setting. A special MedGuide will be given to you before each treatment. Be sure to read this information carefully each time. Talk to your care team about the use of this medication in children. Special care may be needed. Overdosage: If you think you have taken too much of this medicine contact a poison control center or emergency room at once. NOTE: This medicine is only for you. Do not share this medicine with others. What if I miss a dose? Keep appointments for follow-up doses. It is important not to miss your dose. Call your care team if you are unable to keep an appointment. What may interact with this medication? Interactions have not been studied. This list may not describe all possible interactions. Give your health care provider a list of all the medicines, herbs, non-prescription drugs, or dietary supplements you use. Also tell them if you smoke, drink alcohol, or use illegal drugs. Some items may interact with your medicine. What should I watch for while using this medication? Your condition will be monitored carefully while you are receiving this medication. You may need blood work while taking this medication. This medication may cause serious skin reactions. They can happen weeks to months after starting the medication. Contact your care team right away if you notice fevers or flu-like symptoms with a rash. The rash may be red or purple and then turn into blisters or peeling of the skin. You may also notice a red rash with swelling of the face, lips, or lymph nodes in your neck or under your arms. Tell your care team right away if you have any change in your eyesight. Talk to your care team if you may be pregnant. Serious birth defects can occur if you take this medication during pregnancy and for 3 months after the last dose. You will need a negative pregnancy test before starting this medication.  Contraception is recommended while taking this medication and for 3 months after the last dose. Your care team can help you find the option that works for you. Do not breastfeed while taking this medication and for 3 months after the last dose. What side effects may I notice from receiving this medication? Side effects that you should report to your care team as soon as possible: Allergic reactions--skin rash, itching, hives, swelling of the face, lips, tongue, or throat Dry cough, shortness of breath or trouble breathing Eye pain, redness, irritation, or discharge with blurry or decreased vision Heart muscle inflammation--unusual weakness or fatigue, shortness of breath, chest pain, fast or irregular heartbeat, dizziness, swelling of the ankles, feet, or hands Hormone gland problems--headache, sensitivity to light, unusual weakness or fatigue, dizziness, fast or irregular heartbeat, increased sensitivity to cold or heat, excessive sweating, constipation, hair loss, increased thirst or amount of urine, tremors or shaking, irritability Infusion reactions--chest pain, shortness of breath or trouble breathing, feeling faint or lightheaded Kidney injury (glomerulonephritis)--decrease in the amount of urine, red or dark brown urine, foamy or bubbly urine, swelling of the ankles, hands, or feet Liver injury--right upper belly pain, loss of appetite, nausea, light-colored stool, dark yellow or brown urine, yellowing skin or eyes,  unusual weakness or fatigue Pain, tingling, or numbness in the hands or feet, muscle weakness, change in vision, confusion or trouble speaking, loss of balance or coordination, trouble walking, seizures Rash, fever, and swollen lymph nodes Redness, blistering, peeling, or loosening of the skin, including inside the mouth Sudden or severe stomach pain, bloody diarrhea, fever, nausea, vomiting Side effects that usually do not require medical attention (report these to your care team  if they continue or are bothersome): Bone, joint, or muscle pain Diarrhea Fatigue Loss of appetite Nausea Skin rash This list may not describe all possible side effects. Call your doctor for medical advice about side effects. You may report side effects to FDA at 1-800-FDA-1088. Where should I keep my medication? This medication is given in a hospital or clinic. It will not be stored at home. NOTE: This sheet is a summary. It may not cover all possible information. If you have questions about this medicine, talk to your doctor, pharmacist, or health care provider.  2024 [employer] Standard (2021-05-26 00:00:00)

## 2023-09-15 NOTE — Progress Notes (Signed)
 Patient will not be in town on labor day. [place].

## 2023-09-16 ENCOUNTER — Other Ambulatory Visit: Payer: Self-pay

## 2023-09-29 ENCOUNTER — Ambulatory Visit: Admitting: Oncology

## 2023-09-29 ENCOUNTER — Other Ambulatory Visit

## 2023-09-29 ENCOUNTER — Ambulatory Visit

## 2023-10-06 ENCOUNTER — Inpatient Hospital Stay: Attending: Oncology

## 2023-10-06 ENCOUNTER — Inpatient Hospital Stay (HOSPITAL_BASED_OUTPATIENT_CLINIC_OR_DEPARTMENT_OTHER): Admitting: Oncology

## 2023-10-06 ENCOUNTER — Encounter: Payer: Self-pay | Admitting: Oncology

## 2023-10-06 ENCOUNTER — Inpatient Hospital Stay

## 2023-10-06 VITALS — BP 152/69 | HR 78 | Temp 96.9°F | Resp 15 | Wt 127.5 lb

## 2023-10-06 DIAGNOSIS — Z87891 Personal history of nicotine dependence: Secondary | ICD-10-CM | POA: Diagnosis not present

## 2023-10-06 DIAGNOSIS — Z5112 Encounter for antineoplastic immunotherapy: Secondary | ICD-10-CM | POA: Insufficient documentation

## 2023-10-06 DIAGNOSIS — R609 Edema, unspecified: Secondary | ICD-10-CM | POA: Insufficient documentation

## 2023-10-06 DIAGNOSIS — K5730 Diverticulosis of large intestine without perforation or abscess without bleeding: Secondary | ICD-10-CM | POA: Insufficient documentation

## 2023-10-06 DIAGNOSIS — Z860100 Personal history of colon polyps, unspecified: Secondary | ICD-10-CM | POA: Diagnosis not present

## 2023-10-06 DIAGNOSIS — M8580 Other specified disorders of bone density and structure, unspecified site: Secondary | ICD-10-CM | POA: Diagnosis not present

## 2023-10-06 DIAGNOSIS — J40 Bronchitis, not specified as acute or chronic: Secondary | ICD-10-CM | POA: Insufficient documentation

## 2023-10-06 DIAGNOSIS — Z923 Personal history of irradiation: Secondary | ICD-10-CM | POA: Insufficient documentation

## 2023-10-06 DIAGNOSIS — K219 Gastro-esophageal reflux disease without esophagitis: Secondary | ICD-10-CM | POA: Diagnosis not present

## 2023-10-06 DIAGNOSIS — K449 Diaphragmatic hernia without obstruction or gangrene: Secondary | ICD-10-CM | POA: Diagnosis not present

## 2023-10-06 DIAGNOSIS — Z9221 Personal history of antineoplastic chemotherapy: Secondary | ICD-10-CM | POA: Diagnosis not present

## 2023-10-06 DIAGNOSIS — C3411 Malignant neoplasm of upper lobe, right bronchus or lung: Secondary | ICD-10-CM | POA: Insufficient documentation

## 2023-10-06 DIAGNOSIS — J432 Centrilobular emphysema: Secondary | ICD-10-CM | POA: Insufficient documentation

## 2023-10-06 DIAGNOSIS — N136 Pyonephrosis: Secondary | ICD-10-CM | POA: Diagnosis not present

## 2023-10-06 DIAGNOSIS — C3432 Malignant neoplasm of lower lobe, left bronchus or lung: Secondary | ICD-10-CM | POA: Insufficient documentation

## 2023-10-06 DIAGNOSIS — I2510 Atherosclerotic heart disease of native coronary artery without angina pectoris: Secondary | ICD-10-CM | POA: Insufficient documentation

## 2023-10-06 DIAGNOSIS — I700 Atherosclerosis of aorta: Secondary | ICD-10-CM | POA: Insufficient documentation

## 2023-10-06 DIAGNOSIS — E7800 Pure hypercholesterolemia, unspecified: Secondary | ICD-10-CM | POA: Insufficient documentation

## 2023-10-06 DIAGNOSIS — I10 Essential (primary) hypertension: Secondary | ICD-10-CM | POA: Diagnosis not present

## 2023-10-06 DIAGNOSIS — E785 Hyperlipidemia, unspecified: Secondary | ICD-10-CM | POA: Diagnosis not present

## 2023-10-06 DIAGNOSIS — E042 Nontoxic multinodular goiter: Secondary | ICD-10-CM | POA: Diagnosis not present

## 2023-10-06 DIAGNOSIS — Z79899 Other long term (current) drug therapy: Secondary | ICD-10-CM | POA: Diagnosis not present

## 2023-10-06 LAB — CBC WITH DIFFERENTIAL (CANCER CENTER ONLY)
Abs Immature Granulocytes: 0.03 K/uL (ref 0.00–0.07)
Basophils Absolute: 0 K/uL (ref 0.0–0.1)
Basophils Relative: 0 %
Eosinophils Absolute: 0.2 K/uL (ref 0.0–0.5)
Eosinophils Relative: 3 %
HCT: 42.7 % (ref 36.0–46.0)
Hemoglobin: 14.1 g/dL (ref 12.0–15.0)
Immature Granulocytes: 1 %
Lymphocytes Relative: 18 %
Lymphs Abs: 1.1 K/uL (ref 0.7–4.0)
MCH: 33.3 pg (ref 26.0–34.0)
MCHC: 33 g/dL (ref 30.0–36.0)
MCV: 100.9 fL — ABNORMAL HIGH (ref 80.0–100.0)
Monocytes Absolute: 0.5 K/uL (ref 0.1–1.0)
Monocytes Relative: 9 %
Neutro Abs: 4.2 K/uL (ref 1.7–7.7)
Neutrophils Relative %: 69 %
Platelet Count: 163 K/uL (ref 150–400)
RBC: 4.23 MIL/uL (ref 3.87–5.11)
RDW: 12.4 % (ref 11.5–15.5)
WBC Count: 6 K/uL (ref 4.0–10.5)
nRBC: 0 % (ref 0.0–0.2)

## 2023-10-06 LAB — CMP (CANCER CENTER ONLY)
ALT: 12 U/L (ref 0–44)
AST: 19 U/L (ref 15–41)
Albumin: 3.8 g/dL (ref 3.5–5.0)
Alkaline Phosphatase: 58 U/L (ref 38–126)
Anion gap: 11 (ref 5–15)
BUN: 19 mg/dL (ref 8–23)
CO2: 21 mmol/L — ABNORMAL LOW (ref 22–32)
Calcium: 9.1 mg/dL (ref 8.9–10.3)
Chloride: 104 mmol/L (ref 98–111)
Creatinine: 0.85 mg/dL (ref 0.44–1.00)
GFR, Estimated: >60 mL/min (ref >60)
Glucose, Bld: 85 mg/dL (ref 70–99)
Potassium: 4.1 mmol/L (ref 3.5–5.1)
Sodium: 136 mmol/L (ref 135–145)
Total Bilirubin: 0.6 mg/dL (ref 0.0–1.2)
Total Protein: 6.7 g/dL (ref 6.5–8.1)

## 2023-10-06 NOTE — Progress Notes (Signed)
 Hematology/Oncology Progress note [phone] [phone]     Chief Complaint:  Follow up for recurrent non small cell lung cancer   ASSESSMENT & PLAN:  Heather [name] is a 88 y.o. Heather [name] female with stage IB adenocarcinoma of the right upper lobe status post wedge resection on 03/13/2014. T2aN1M0 (stage IB).  She is s/p wedge resection of a left lower lobe mass on 03/10/2017. T1aNx (stage IA). No with locally recurrent NSCLC   Malignant neoplasm of lower lobe of left lung (HCC) #History of stage Ib right lung upper lobe adenocarcinoma status post wedge resection in 2016 History of left lower lobe adenocarcinoma T1 a NX status post wedge resection 2019. Recurrence - Biopsy of pretracheal lymph node positive for NSCLC.  MRI brain is negative.  Recommend concurrent chemotherapy [carboplatin  taxol ]  with radiation.  Labs are reviewed and discussed with patient. S/p concurrent carboplatin  AUC2, taxol  45mg /m with Radiation.   Interval CT scan showed partial response Recommend immunotherapy consolidation with Durvalumab  Q2weeks for up to a year.  Labs are reviewed and discussed with patient. Hold Durvalumab       Orders Placed This Encounter  Procedures   CBC with Differential (Cancer Center Only)    Standing Status:   Future    Expected Date:   10/20/2023    Expiration Date:   10/19/2024   CMP (Cancer Center only)    Standing Status:   Future    Expected Date:   10/20/2023    Expiration Date:   10/19/2024   Follow up 2 weeks. All questions were answered. The patient knows to call the clinic with any problems, questions or concerns.  We spent sufficient time to discuss many aspect of care, questions were answered to patient's satisfaction.   [name], MD, PhD [facility] Hematology Oncology 10/06/2023   PERTINENT ONCOLOGY HISTORY Previously follows up with Dr.Corcoran. Estasblish care with me on 03/15/2018  Oncology History  Primary cancer of right upper lobe of  lung (HCC)  03/13/2014 Initial Diagnosis   Primary cancer of right upper lobe of lung   PET scan on 02/13/2014 revealed a 1 cm right upper lobe hypermetabolic nodule (SUV 3.8) and a 6 mm right upper lobe nodule (no metabolic activity). There was no mediastianl adenopathy.  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 Pathology revealed a 1.1 cm high-grade adenocarcinoma with pleural invasion. Nodes were negative. Pathologic stage was T2aN1M0 (stage IB).     03/30/2023 Imaging   PET  1. Hypermetabolic pretracheal lymph node is concerning for metastatic adenopathy. 2. No evidence of local recurrence in the RIGHT upper lobe. 3. No evidence of metastatic disease in the abdomen pelvis. 4. Small nodule along the LEFT oblique fissure has metabolic activity; however, nodule has been stable in size since 03/16/2021. Favor benign nodule. 5. LEFT hydronephrosis with double-J ureteral stent in place.   05/16/2023 - 06/27/2023 Chemotherapy   Patient is on Treatment Plan : Carboplatin  + Paclitaxel  Weekly X 6 Weeks with XRT     07/31/2023 Imaging   CT chest wo contrast   1. Interval decrease in size of the precarinal lymph node seen on prior studies. This lymph node is seen to be hypermetabolic on PET-CT 03/30/2023. Close continued attention warranted. 2. Stable 8 mm retro hilar left lung nodule. Additional scattered smaller pulmonary nodules bilaterally are unchanged. 3. Stable focus of architectural distortion in the medial left upper lobe with a 5 mm nodular component. 4. Aortic Atherosclerosis (ICD10-I70.0)  and Emphysema (ICD10-J43.9).   08/04/2023 -  Chemotherapy   Patient is on Treatment Plan : LUNG Durvalumab  (10) q14d     Malignant neoplasm of lower lobe of left lung (HCC)  03/10/2017 Initial Diagnosis   Malignant neoplasm of lower lobe of left lung   Chest CT on 02/10/2017 revealed clear interval progression of the posterior left lower lobe  pulmonary nodule, now measuring 12 mm in long axis and having a distinctly lobular contour. Imaging features were very concerning for neoplasm.  There was no change in the 7 mm nodule posterior to the right hilum.   PET scan on 02/18/2017 revealed a 11 x 8 mm posterior left lower lobe pulmonary nodule that had mildly progressed from prior studies and demonstrated mild hypermetabolism (SUV 1.6).  The appearance was considered worrisome for an indolent primary bronchogenic neoplasm.   03/10/2017.  s/p wedge resection of a left lower lobe mass.   Pathology revealed an 8 mm invasive adenocarcinoma with solid and acinar patterns.  There was no visceral invasion.  There was no lymphovascular invasion.  All margins were negative.  Pathologic stage was T1aNx (stage IA).   03/30/2023 Imaging   PET  1. Hypermetabolic pretracheal lymph node is concerning for metastatic adenopathy. 2. No evidence of local recurrence in the RIGHT upper lobe. 3. No evidence of metastatic disease in the abdomen pelvis. 4. Small nodule along the LEFT oblique fissure has metabolic activity; however, nodule has been stable in size since 03/16/2021. Favor benign nodule. 5. LEFT hydronephrosis with double-J ureteral stent in place.   04/27/2023 Cancer Staging   Staging form: Lung, AJCC 8th Edition - Pathologic stage from 04/27/2023: pN2, cM0 - Signed by Babara Call, MD on 04/27/2023 Stage prefix: Recurrence   05/16/2023 - 06/27/2023 Chemotherapy   Patient is on Treatment Plan : Carboplatin  + Paclitaxel  Weekly X 6 Weeks with XRT     07/31/2023 Imaging   CT chest wo contrast   1. Interval decrease in size of the precarinal lymph node seen on prior studies. This lymph node is seen to be hypermetabolic on PET-CT 03/30/2023. Close continued attention warranted. 2. Stable 8 mm retro hilar left lung nodule. Additional scattered smaller pulmonary nodules bilaterally are unchanged. 3. Stable focus of architectural distortion in the medial left  upper lobe with a 5 mm nodular component. 4. Aortic Atherosclerosis (ICD10-I70.0) and Emphysema (ICD10-J43.9).   08/04/2023 -  Chemotherapy   Patient is on Treatment Plan : LUNG Durvalumab  (10) q14d       INTERVAL HISTORY Heather [name] is a 88 y.o. female who has above history reviewed by me today presents for follow up visit for management of recurrent lung cancer, .  Patient reports feeling well.  She was accompanied by her husband Denies shortness of breath, fatigue, hemoptysis, unintentional weight loss. Patient reports chest congestion.  Recently started on a course of Augmentin for bronchitis.   Past Medical History:  Diagnosis Date   Anginal pain (HCC)    Benign essential hypertension    Brain aneurysm    Carotid artery stenosis    Carotid stenosis 06/19/2013   Colonic polyp    Emphysema lung (HCC)    GERD (gastroesophageal reflux disease)    Hiatal hernia    Hypercholesteremia    Hyperlipidemia, mixed 06/09/2015   Lung cancer (HCC) 2016   Lung nodule 06/21/2014   Macrocytic 10/06/2014   Multiple thyroid  nodules    PAT (paroxysmal atrial tachycardia) (HCC) 06/19/2013   Plantar fasciitis  Past Surgical History:  Procedure Laterality Date   BREAST EXCISIONAL BIOPSY Right 40 yrs ago   neg   BREAST LUMPECTOMY     benign   CATARACT EXTRACTION W/ INTRAOCULAR LENS  IMPLANT, BILATERAL Bilateral    CEREBRAL ANEURYSM REPAIR  2004   coil treatment   CERVIX LESION DESTRUCTION     COLONOSCOPY     1995, 2006, 2014   COLONOSCOPY WITH PROPOFOL  N/A 03/31/2015   Procedure: COLONOSCOPY WITH PROPOFOL ;  Surgeon: Lamar ONEIDA Holmes, MD;  Location: [facility] ENDOSCOPY;  Service: Endoscopy;  Laterality: N/A;   CYSTOSCOPY W/ RETROGRADES Left 03/02/2022   Procedure: CYSTOSCOPY WITH RETROGRADE PYELOGRAM;  Surgeon: Twylla Glendia BROCKS, MD;  Location: [facility] ORS;  Service: Urology;  Laterality: Left;   CYSTOSCOPY W/ RETROGRADES Left 12/28/2022   Procedure: CYSTOSCOPY WITH RETROGRADE PYELOGRAM;   Surgeon: Twylla Glendia BROCKS, MD;  Location: [facility] ORS;  Service: Urology;  Laterality: Left;   CYSTOSCOPY W/ URETERAL STENT PLACEMENT Left 03/03/2021   Procedure: CYSTOSCOPY WITH EXCHANGE;  Surgeon: Twylla Glendia BROCKS, MD;  Location: [facility] ORS;  Service: Urology;  Laterality: Left;   CYSTOSCOPY W/ URETERAL STENT PLACEMENT Left 03/02/2022   Procedure: CYSTOSCOPY WITH STENT EXCHANGE;  Surgeon: Twylla Glendia BROCKS, MD;  Location: [facility] ORS;  Service: Urology;  Laterality: Left;   CYSTOSCOPY W/ URETERAL STENT PLACEMENT Left 12/28/2022   Procedure: CYSTOSCOPY WITH STENT EXCHANGE;  Surgeon: Twylla Glendia BROCKS, MD;  Location: [facility] ORS;  Service: Urology;  Laterality: Left;   CYSTOSCOPY WITH STENT PLACEMENT Left 11/25/2020   Procedure: CYSTOSCOPY WITH STENT PLACEMENT;  Surgeon: Twylla Glendia BROCKS, MD;  Location: [facility] ORS;  Service: Urology;  Laterality: Left;   ENDOBRONCHIAL ULTRASOUND Bilateral 04/19/2023   Procedure: ENDOBRONCHIAL ULTRASOUND (EBUS);  Surgeon: Isadora Hose, MD;  Location: [facility] ORS;  Service: Pulmonary;  Laterality: Bilateral;   FLEXIBLE BRONCHOSCOPY N/A 03/10/2017   Procedure: FLEXIBLE BRONCHOSCOPY;  Surgeon: Volney Lye, MD;  Location: [facility] ORS;  Service: Thoracic;  Laterality: N/A;   THORACOSCOPY WITH WEDGE RESECTION LUNG Right 03/13/2014   RUL   THORACOTOMY/LOBECTOMY Left 03/10/2017   Procedure: THORACOTOMY WITH LUNG WEDGE RESECTION POSSIBLE LOBECTOMY;  Surgeon: Volney Lye, MD;  Location: [facility] ORS;  Service: Thoracic;  Laterality: Left;   TUBAL LIGATION      Family History  Problem Relation Age of Onset   Alcohol abuse Mother    Heart attack Father    Breast cancer Neg Hx     Social History:  reports that she quit smoking about 35 years ago. Her smoking use included cigarettes. She started smoking about 70 years ago. She has a 35 pack-year smoking history. She has never used smokeless tobacco. She reports current alcohol use. She reports that she does not use drugs.   Allergies:   Allergies  Allergen Reactions   Ace Inhibitors Swelling   Contrast Media [Iodinated Contrast Media]    Cortisone     Patient had facial swelling and itching from cortisone injection IM    Current Medications: Current Outpatient Medications  Medication Sig Dispense Refill   amLODipine  (NORVASC ) 5 MG tablet Take 5 mg by mouth at bedtime.     amoxicillin -clavulanate (AUGMENTIN) 875-125 MG tablet Take by mouth.     atenolol  (TENORMIN ) 50 MG tablet Take 50 mg by mouth at bedtime.     calcium -vitamin D  (OSCAL WITH D) 500-200 MG-UNIT tablet Take 1 tablet by mouth daily with breakfast.     cetirizine (ZYRTEC) 10 MG tablet Take 10 mg by mouth daily.  dextromethorphan-guaiFENesin  (MUCINEX  DM) 30-600 MG 12hr tablet Take 1 tablet by mouth 2 (two) times daily as needed for cough.     donepezil  (ARICEPT ) 10 MG tablet Take 1 tablet by mouth at bedtime.     fluticasone  (FLONASE ) 50 MCG/ACT nasal spray Place 1 spray into the nose daily as needed for allergies.      nystatin  (MYCOSTATIN ) 100000 UNIT/ML suspension Take 5 mLs (500,000 Units total) by mouth 4 (four) times daily. 473 mL 0   OLANZapine  (ZYPREXA ) 5 MG tablet Take 1 tablet (5 mg total) by mouth at bedtime. For appetite, sleep, and nausea 30 tablet 2   simvastatin  (ZOCOR ) 20 MG tablet Take 20 mg by mouth every morning.     sucralfate  (CARAFATE ) 1 GM/10ML suspension Take 10 mLs (1 g total) by mouth 4 (four) times daily -  with meals and at bedtime. For radiation esophagitis 420 mL 1   trospium  (SANCTURA ) 20 MG tablet Take 1 tablet (20 mg total) by mouth 2 (two) times daily. 30 tablet 1   vitamin B-12 (CYANOCOBALAMIN ) 100 MCG tablet Take 100 mcg by mouth daily.     No current facility-administered medications for this visit.   Review of Systems  Constitutional:  Negative for appetite change, chills, fatigue and fever.  HENT:   Positive for hearing loss. Negative for voice change.   Eyes:  Negative for eye problems.  Respiratory:  Negative  for chest tightness and cough.        Congestion  Cardiovascular:  Negative for chest pain.  Gastrointestinal:  Negative for abdominal distention, abdominal pain, blood in stool and nausea.  Endocrine: Negative for hot flashes.  Genitourinary:  Negative for difficulty urinating and frequency.   Musculoskeletal:  Negative for arthralgias.       Chronic intermittent back pain  Skin:  Negative for itching and rash.  Neurological:  Negative for extremity weakness.  Hematological:  Negative for adenopathy.  Psychiatric/Behavioral:  Negative for confusion.     Performance status (ECOG): 0 - Asymptomatic  Vital Signs BP (!) 152/69   Pulse 78   Temp (!) 96.9 F (36.1 C)   Resp 15   Wt 127 lb 8 oz (57.8 kg)   SpO2 95%   BMI 23.32 kg/m   Physical Exam Constitutional:      General: She is not in acute distress.    Appearance: She is not diaphoretic.  HENT:     Head: Normocephalic and atraumatic.  Eyes:     General: No scleral icterus. Cardiovascular:     Rate and Rhythm: Normal rate and regular rhythm.  Pulmonary:     Effort: Pulmonary effort is normal. No respiratory distress.  Abdominal:     General: There is no distension.     Palpations: Abdomen is soft.     Tenderness: There is no abdominal tenderness.  Musculoskeletal:        General: Normal range of motion.     Cervical back: Normal range of motion and neck supple.  Skin:    General: Skin is dry.     Findings: No erythema.  Neurological:     Mental Status: She is alert and oriented to person, place, and time. Mental status is at baseline.  Psychiatric:        Mood and Affect: Mood and affect normal.     RADIOGRAPHIC STUDIES: I have personally reviewed the radiological images as listed and agreed with the findings in the report. CT Chest Wo Contrast Result Date: 07/31/2023 CLINICAL  DATA:  Non-small-cell lung cancer. Restaging. * Tracking Code: BO * EXAM: CT CHEST WITHOUT CONTRAST TECHNIQUE: Multidetector CT  imaging of the chest was performed following the standard protocol without IV contrast. RADIATION DOSE REDUCTION: This exam was performed according to the departmental dose-optimization program which includes automated exposure control, adjustment of the mA and/or kV according to patient size and/or use of iterative reconstruction technique. COMPARISON:  PET-CT 03/30/2023.  Chest CT 03/18/2023. FINDINGS: Cardiovascular: The heart size is normal. No substantial pericardial effusion. Coronary artery calcification is evident. Moderate atherosclerotic calcification is noted in the wall of the thoracic aorta. Mediastinum/Nodes: 13 mm short axis precarinal lymph node seen on prior studies is smaller in the interval measuring 10 mm short axis today on 54/2. No other mediastinal lymphadenopathy. No evidence for gross hilar lymphadenopathy although assessment is limited by the lack of intravenous contrast on the current study. Tiny hiatal hernia. The esophagus has normal imaging features. There is no axillary lymphadenopathy. Lungs/Pleura: Centrilobular and paraseptal emphysema evident. Biapical pleuroparenchymal scarring evident. Focus of architectural distortion in the medial left upper lobe a nodular component measured previously at 5 mm stable at 5 mm today (29/4). 8 mm retro hilar left lung nodule measured previously is stable at 8 mm today (53/4). Subtle ground-glass opacity posterior right lower lobe stable on 78/4. No new suspicious pulmonary nodule or mass. No focal airspace consolidation. No pleural effusion. Surgical scarring noted posterior left lower lobe, stable. Upper Abdomen: Scattered tiny hypodensities in the liver parenchyma are too small to characterize but are statistically most likely benign. No followup imaging is recommended. Musculoskeletal: No worrisome lytic or sclerotic osseous abnormality. IMPRESSION: 1. Interval decrease in size of the precarinal lymph node seen on prior studies. This lymph node  is seen to be hypermetabolic on PET-CT 03/30/2023. Close continued attention warranted. 2. Stable 8 mm retro hilar left lung nodule. Additional scattered smaller pulmonary nodules bilaterally are unchanged. 3. Stable focus of architectural distortion in the medial left upper lobe with a 5 mm nodular component. 4. Aortic Atherosclerosis (ICD10-I70.0) and Emphysema (ICD10-J43.9). Electronically Signed   By: Camellia Candle M.D.   On: 07/31/2023 10:28   CT ABDOMEN PELVIS WO CONTRAST Result Date: 07/11/2023 CLINICAL DATA:  Status post chemotherapy for lung cancer. Progressive abdominal pain. Change in bowel habits. Urinary incontinence. * Tracking Code: BO * EXAM: CT ABDOMEN AND PELVIS WITHOUT CONTRAST TECHNIQUE: Multidetector CT imaging of the abdomen and pelvis was performed following the standard protocol without IV contrast. RADIATION DOSE REDUCTION: This exam was performed according to the departmental dose-optimization program which includes automated exposure control, adjustment of the mA and/or kV according to patient size and/or use of iterative reconstruction technique. COMPARISON:  05/28/2023 FINDINGS: Lower chest: Left lower lobe surgical sutures. Normal heart size without pericardial or pleural effusion. Tiny hiatal hernia. Hepatobiliary: Hepatic cysts and too small to characterize lesions, suboptimally evaluated on noncontrast exam. Normal gallbladder, without biliary ductal dilatation. Pancreas: Normal pancreas, without duct dilatation. Foci of hyperattenuation within the expected location of the distal common duct including on 29/2 are present previously. Spleen: Normal in size, without focal abnormality. Adrenals/Urinary Tract: Normal adrenal glands. Moderate left-sided hydronephrosis is unchanged. Ureteric stent originates in a left extrarenal pelvis. Similar mild edema surrounds the left renal pelvis. The stent terminates in the left side of the urinary bladder. No bladder calculi. Stomach/Bowel:  Normal remainder of the stomach. Moderate stool in the rectum. Extensive colonic diverticulosis. Sigmoid diverticulitis has resolved. Normal terminal ileum and appendix. Normal small bowel.  Vascular/Lymphatic: Advanced aortic and branch vessel atherosclerosis. No abdominopelvic adenopathy. Reproductive: Normal uterus and adnexa. Other: No significant free fluid.  No free intraperitoneal air. Musculoskeletal: Osteopenia. Tarlov cysts. IMPRESSION: 1. Resolved sigmoid diverticulitis. 2. Similar appearance of moderate left-sided hydronephrosis with ureteric stent originating in the an extrarenal pelvis. Similar mild surrounding edema for which ascending infection cannot be excluded. Alternatively, this edema could simply be secondary to chronic hydronephrosis. 3. No other explanation for acute pain. 4. Stool in the rectum suggests constipation or even mild fecal impaction. 5. Hyperattenuation in the region of the distal common duct has been present back to at least 2022, favoring vascular calcification. Nonobstructive choledocholithiasis felt unlikely. 6. Incidental findings, including: Tiny hiatal hernia. Aortic Atherosclerosis (ICD10-I70.0). Electronically Signed   By: Rockey Kilts M.D.   On: 07/11/2023 13:44    Laboratory results were reviewed by me    Latest Ref Rng & Units 10/06/2023   12:51 PM 09/15/2023    1:04 PM 09/01/2023   10:23 AM  CBC  WBC 4.0 - 10.5 K/uL 6.0  5.8  6.3   Hemoglobin 12.0 - 15.0 g/dL 85.8  86.2  86.3   Hematocrit 36.0 - 46.0 % 42.7  41.1  41.5   Platelets 150 - 400 K/uL 163  178  169       Latest Ref Rng & Units 10/06/2023   12:51 PM 09/15/2023    1:03 PM 09/01/2023   10:23 AM  CMP  Glucose 70 - 99 mg/dL 85  892  93   BUN 8 - 23 mg/dL 19  23  23    Creatinine 0.44 - 1.00 mg/dL 9.14  9.12  9.07   Sodium 135 - 145 mmol/L 136  138  141   Potassium 3.5 - 5.1 mmol/L 4.1  4.1  4.5   Chloride 98 - 111 mmol/L 104  107  103   CO2 22 - 32 mmol/L 21  25  28    Calcium  8.9 - 10.3 mg/dL  9.1  9.1  9.4   Total Protein 6.5 - 8.1 g/dL 6.7  6.6  6.5   Total Bilirubin 0.0 - 1.2 mg/dL 0.6  0.7  0.8   Alkaline Phos 38 - 126 U/L 58  59  55   AST 15 - 41 U/L 19  22  21    ALT 0 - 44 U/L 12  16  17

## 2023-10-06 NOTE — Progress Notes (Signed)
 Nutrition Follow-up:  Patient with recurrent non small cell lung cancer.  Patient receiving durvalumab , on hold today  Met with patient and husband following MD visit.  Patient reports that appetite is good.  Ate bagel with cream cheese and ham this am.  Yesterday ate pack of nabs and apple and supper last night ate fried chicken with butter beans and corn and cantaloupe.  Drinks oral nutrition supplements usually with lunch.  Taste and smell effected some.  Enjoying ice cream   Medications: reviewed  Labs: reviewed  Anthropometrics:   Weight 127 lb today  128 lb 8/21 125 lb on 7/24 121 lb on 7/10 123 lb 9.6 oz on 6/9 124 lb on 5/27 128 lb on 5/12   NUTRITION DIAGNOSIS: Inadequate oral intake continues   INTERVENTION:  Continue high calorie, high protein foods to promote weight maintenance Continue oral nutrition supplements    MONITORING, EVALUATION, GOAL: weight trends, intake   NEXT VISIT: as needed RD available   Robbye Dede B. Dasie SOLON, CSO, LDN Registered Dietitian [phone]

## 2023-10-06 NOTE — Assessment & Plan Note (Signed)
#  History of stage Ib right lung upper lobe adenocarcinoma status post wedge resection in 2016 History of left lower lobe adenocarcinoma T1 a NX status post wedge resection 2019. Recurrence - Biopsy of pretracheal lymph node positive for NSCLC.  MRI brain is negative.  Recommend concurrent chemotherapy [carboplatin  taxol ]  with radiation.  Labs are reviewed and discussed with patient. S/p concurrent carboplatin  AUC2, taxol  45mg /m with Radiation.   Interval CT scan showed partial response Recommend immunotherapy consolidation with Durvalumab  Q2weeks for up to a year.  Labs are reviewed and discussed with patient. Hold Durvalumab

## 2023-10-07 ENCOUNTER — Other Ambulatory Visit: Payer: Self-pay

## 2023-10-11 ENCOUNTER — Other Ambulatory Visit: Payer: Self-pay | Admitting: *Deleted

## 2023-10-11 DIAGNOSIS — N200 Calculus of kidney: Secondary | ICD-10-CM

## 2023-10-12 ENCOUNTER — Ambulatory Visit
Admission: RE | Admit: 2023-10-12 | Discharge: 2023-10-12 | Disposition: A | Discharge status: Home / Self Care | Source: Ambulatory Visit | Attending: Urology | Admitting: Urology

## 2023-10-12 ENCOUNTER — Ambulatory Visit: Admitting: Urology

## 2023-10-12 VITALS — BP 151/83 | HR 72 | Ht 62.0 in | Wt 123.0 lb

## 2023-10-12 DIAGNOSIS — N200 Calculus of kidney: Secondary | ICD-10-CM | POA: Insufficient documentation

## 2023-10-12 DIAGNOSIS — N135 Crossing vessel and stricture of ureter without hydronephrosis: Secondary | ICD-10-CM | POA: Diagnosis not present

## 2023-10-12 NOTE — Progress Notes (Signed)
 10/12/2023 2:06 PM   Heather [name] 1935-05-19 [id number]  Referring provider: Cleotilde Oneil FALCON, MD [id number] [facility] [address] [facility] [place],  [place] [address]  Chief Complaint  Patient presents with   Nephrolithiasis   Urologic history:  1. Left UPJ obstruction Chronic left UPJ obstruction, however admitted with pyelonephritis/pyonephrosis with sepsis November 2022 and underwent stent placement.  Elected a chronic indwelling stent.   HPI: Heather [name] is a 88 y.o. female who presents for scheduled follow-up.  No complaints since her last visit No flank, abdominal, pelvic pain Stable urinary frequency/urgency Denies gross hematuria Tentative plan was to schedule for stent exchange December 2025   PMH: Past Medical History:  Diagnosis Date   Anginal pain (HCC)    Benign essential hypertension    Brain aneurysm    Carotid artery stenosis    Carotid stenosis 06/19/2013   Colonic polyp    Emphysema lung (HCC)    GERD (gastroesophageal reflux disease)    Hiatal hernia    Hypercholesteremia    Hyperlipidemia, mixed 06/09/2015   Lung cancer (HCC) 2016   Lung nodule 06/21/2014   Macrocytic 10/06/2014   Multiple thyroid  nodules    PAT (paroxysmal atrial tachycardia) (HCC) 06/19/2013   Plantar fasciitis     Surgical History: Past Surgical History:  Procedure Laterality Date   BREAST EXCISIONAL BIOPSY Right 40 yrs ago   neg   BREAST LUMPECTOMY     benign   CATARACT EXTRACTION W/ INTRAOCULAR LENS  IMPLANT, BILATERAL Bilateral    CEREBRAL ANEURYSM REPAIR  2004   coil treatment   CERVIX LESION DESTRUCTION     COLONOSCOPY     1995, 2006, 2014   COLONOSCOPY WITH PROPOFOL  N/A 03/31/2015   Procedure: COLONOSCOPY WITH PROPOFOL ;  Surgeon: Lamar ONEIDA Holmes, MD;  Location: [facility] END[facility]COPY;  Service: Endoscopy;  Laterality: N/A;   CYSTOSCOPY W/ RETROGRADES Left 03/02/2022   Procedure: CYSTOSCOPY WITH RETROGRADE PYELOGRAM;  Surgeon: Twylla Glendia JAYSON,  MD;  Location: [facility] ORS;  Service: Urology;  Laterality: Left;   CYSTOSCOPY W/ RETROGRADES Left 12/28/2022   Procedure: CYSTOSCOPY WITH RETROGRADE PYELOGRAM;  Surgeon: Twylla Glendia JAYSON, MD;  Location: [facility];  Service: Urology;  Laterality: Left;   CYSTOSCOPY W/ URETERAL STENT PLACEMENT Left 03/03/2021   Procedure: CYSTOSCOPY WITH EXCHANGE;  Surgeon: Twylla Glendia JAYSON, MD;  Location: [facility];  Service: Urology;  Laterality: Left;   CYSTOSCOPY W/ URETERAL STENT PLACEMENT Left 03/02/2022   Procedure: CYSTOSCOPY WITH STENT EXCHANGE;  Surgeon: Twylla Glendia JAYSON, MD;  Location: [facility];  Service: Urology;  Laterality: Left;   CYSTOSCOPY W/ URETERAL STENT PLACEMENT Left 12/28/2022   Procedure: CYSTOSCOPY WITH STENT EXCHANGE;  Surgeon: Twylla Glendia JAYSON, MD;  Location: [facility];  Service: Urology;  Laterality: Left;   CYSTOSCOPY WITH STENT PLACEMENT Left 11/25/2020   Procedure: CYSTOSCOPY WITH STENT PLACEMENT;  Surgeon: Twylla Glendia JAYSON, MD;  Location: [facility];  Service: Urology;  Laterality: Left;   ENDOBRONCHIAL ULTRASOUND Bilateral 04/19/2023   Procedure: ENDOBRONCHIAL ULTRASOUND (EBUS);  Surgeon: Isadora Hose, MD;  Location: [facility];  Service: Pulmonary;  Laterality: Bilateral;   FLEXIBLE BRONCHOSCOPY N/A 03/10/2017   Procedure: FLEXIBLE BRONCHOSCOPY;  Surgeon: Volney Lye, MD;  Location: [facility];  Service: Thoracic;  Laterality: N/A;   THORACOSCOPY WITH WEDGE RESECTION LUNG Right 03/13/2014   RUL   THORACOTOMY/LOBECTOMY Left 03/10/2017   Procedure: THORACOTOMY WITH LUNG WEDGE RESECTION POSSIBLE LOBECTOMY;  Surgeon: Volney Lye, MD;  Location: [facility];  Service: Thoracic;  Laterality: Left;   TUBAL LIGATION      Home Medications:  Allergies as of 10/12/2023       Reactions   Ace Inhibitors Swelling   Contrast Media [iodinated Contrast Media]    Cortisone    Patient had facial swelling and itching from cortisone injection IM        Medication List        Accurate as of  October 12, 2023  2:06 PM. If you have any questions, ask your nurse or doctor.          amLODipine  5 MG tablet Commonly known as: NORVASC  Take 5 mg by mouth at bedtime.   amoxicillin -clavulanate 875-125 MG tablet Commonly known as: AUGMENTIN Take by mouth.   atenolol  50 MG tablet Commonly known as: TENORMIN  Take 50 mg by mouth at bedtime.   calcium -vitamin D  500-200 MG-UNIT tablet Commonly known as: OSCAL WITH D Take 1 tablet by mouth daily with breakfast.   cetirizine 10 MG tablet Commonly known as: ZYRTEC Take 10 mg by mouth daily.   cyanocobalamin  100 MCG tablet Commonly known as: VITAMIN B12 Take 100 mcg by mouth daily.   dextromethorphan-guaiFENesin  30-600 MG 12hr tablet Commonly known as: MUCINEX  DM Take 1 tablet by mouth 2 (two) times daily as needed for cough.   donepezil  10 MG tablet Commonly known as: ARICEPT  Take 1 tablet by mouth at bedtime.   fluticasone  50 MCG/ACT nasal spray Commonly known as: FLONASE  Place 1 spray into the nose daily as needed for allergies.   nystatin  100000 UNIT/ML suspension Commonly known as: MYCOSTATIN  Take 5 mLs (500,000 Units total) by mouth 4 (four) times daily.   OLANZapine  5 MG tablet Commonly known as: ZYPREXA  Take 1 tablet (5 mg total) by mouth at bedtime. For appetite, sleep, and nausea   simvastatin  20 MG tablet Commonly known as: ZOCOR  Take 20 mg by mouth every morning.   sucralfate  1 GM/10ML suspension Commonly known as: Carafate  Take 10 mLs (1 g total) by mouth 4 (four) times daily -  with meals and at bedtime. For radiation esophagitis   trospium  20 MG tablet Commonly known as: SANCTURA  Take 1 tablet (20 mg total) by mouth 2 (two) times daily.        Allergies:  Allergies  Allergen Reactions   Ace Inhibitors Swelling   Contrast Media [Iodinated Contrast Media]    Cortisone     Patient had facial swelling and itching from cortisone injection IM    Family History: Family History  Problem  Relation Age of Onset   Alcohol abuse Mother    Heart attack Father    Breast cancer Neg Hx     Social History:  reports that she quit smoking about 35 years ago. Her smoking use included cigarettes. She started smoking about 70 years ago. She has a 35 pack-year smoking history. She has never used smokeless tobacco. She reports current alcohol use. She reports that she does not use drugs.   Physical Exam: BP (!) 151/83   Pulse 72   Ht 5' 2 (1.575 m)   Wt 123 lb (55.8 kg)   BMI 22.50 kg/m   Constitutional:  Alert, No acute distress. HEENT: [place] AT Respiratory: Normal respiratory effort, no increased work of breathing. Psychiatric: Normal mood and affect.   Pertinent Imaging: KUB performed prior to today's visit was personally reviewed and interpreted.  The stent is in good position without encrustation   Assessment & Plan:    1.  Chronic left  UPJ obstruction Schedule cystoscopy/stent exchange December 2025   Glendia JAYSON Barba, MD  [facility] [address] [place], [place] [address] [phone]

## 2023-10-13 ENCOUNTER — Other Ambulatory Visit

## 2023-10-13 ENCOUNTER — Ambulatory Visit

## 2023-10-13 ENCOUNTER — Encounter

## 2023-10-13 ENCOUNTER — Ambulatory Visit: Admitting: Oncology

## 2023-10-20 ENCOUNTER — Ambulatory Visit: Payer: Self-pay | Admitting: Oncology

## 2023-10-20 ENCOUNTER — Inpatient Hospital Stay (HOSPITAL_BASED_OUTPATIENT_CLINIC_OR_DEPARTMENT_OTHER): Admitting: Oncology

## 2023-10-20 ENCOUNTER — Encounter: Payer: Self-pay | Admitting: Oncology

## 2023-10-20 ENCOUNTER — Inpatient Hospital Stay

## 2023-10-20 VITALS — BP 137/67 | HR 77 | Temp 96.8°F | Resp 18 | Wt 125.6 lb

## 2023-10-20 VITALS — BP 135/66

## 2023-10-20 DIAGNOSIS — C3432 Malignant neoplasm of lower lobe, left bronchus or lung: Secondary | ICD-10-CM

## 2023-10-20 DIAGNOSIS — N3000 Acute cystitis without hematuria: Secondary | ICD-10-CM

## 2023-10-20 DIAGNOSIS — R300 Dysuria: Secondary | ICD-10-CM | POA: Diagnosis not present

## 2023-10-20 DIAGNOSIS — C3411 Malignant neoplasm of upper lobe, right bronchus or lung: Secondary | ICD-10-CM | POA: Diagnosis not present

## 2023-10-20 DIAGNOSIS — N390 Urinary tract infection, site not specified: Secondary | ICD-10-CM | POA: Insufficient documentation

## 2023-10-20 DIAGNOSIS — Z5112 Encounter for antineoplastic immunotherapy: Secondary | ICD-10-CM | POA: Diagnosis not present

## 2023-10-20 LAB — URINALYSIS, COMPLETE (UACMP) WITH MICROSCOPIC
Bilirubin Urine: NEGATIVE
Glucose, UA: NEGATIVE mg/dL
Ketones, ur: NEGATIVE mg/dL
Nitrite: POSITIVE — AB
Protein, ur: 100 mg/dL — AB
RBC / HPF: >50 RBC/hpf (ref 0–5)
Specific Gravity, Urine: 1.015 (ref 1.005–1.030)
WBC, UA: >50 WBC/hpf (ref 0–5)
pH: 7 (ref 5.0–8.0)

## 2023-10-20 LAB — CBC WITH DIFFERENTIAL (CANCER CENTER ONLY)
Abs Immature Granulocytes: 0.02 K/uL (ref 0.00–0.07)
Basophils Absolute: 0 K/uL (ref 0.0–0.1)
Basophils Relative: 1 %
Eosinophils Absolute: 0.1 K/uL (ref 0.0–0.5)
Eosinophils Relative: 2 %
HCT: 45.1 % (ref 36.0–46.0)
Hemoglobin: 15.1 g/dL — ABNORMAL HIGH (ref 12.0–15.0)
Immature Granulocytes: 0 %
Lymphocytes Relative: 19 %
Lymphs Abs: 1.1 K/uL (ref 0.7–4.0)
MCH: 33.2 pg (ref 26.0–34.0)
MCHC: 33.5 g/dL (ref 30.0–36.0)
MCV: 99.1 fL (ref 80.0–100.0)
Monocytes Absolute: 0.5 K/uL (ref 0.1–1.0)
Monocytes Relative: 10 %
Neutro Abs: 3.8 K/uL (ref 1.7–7.7)
Neutrophils Relative %: 68 %
Platelet Count: 180 K/uL (ref 150–400)
RBC: 4.55 MIL/uL (ref 3.87–5.11)
RDW: 12.1 % (ref 11.5–15.5)
WBC Count: 5.6 K/uL (ref 4.0–10.5)
nRBC: 0 % (ref 0.0–0.2)

## 2023-10-20 LAB — CMP (CANCER CENTER ONLY)
ALT: 11 U/L (ref 0–44)
AST: 22 U/L (ref 15–41)
Albumin: 3.9 g/dL (ref 3.5–5.0)
Alkaline Phosphatase: 60 U/L (ref 38–126)
Anion gap: 9 (ref 5–15)
BUN: 21 mg/dL (ref 8–23)
CO2: 24 mmol/L (ref 22–32)
Calcium: 9.3 mg/dL (ref 8.9–10.3)
Chloride: 103 mmol/L (ref 98–111)
Creatinine: 0.8 mg/dL (ref 0.44–1.00)
GFR, Estimated: >60 mL/min (ref >60)
Glucose, Bld: 109 mg/dL — ABNORMAL HIGH (ref 70–99)
Potassium: 3.9 mmol/L (ref 3.5–5.1)
Sodium: 136 mmol/L (ref 135–145)
Total Bilirubin: 0.6 mg/dL (ref 0.0–1.2)
Total Protein: 6.9 g/dL (ref 6.5–8.1)

## 2023-10-20 MED ORDER — SODIUM CHLORIDE 0.9 % IV SOLN
INTRAVENOUS | Status: DC
  Filled 2023-10-20: qty 250

## 2023-10-20 MED ORDER — SODIUM CHLORIDE 0.9 % IV SOLN
10.0000 mg/kg | Freq: Once | INTRAVENOUS | Status: AC
  Administered 2023-10-20: 500 mg via INTRAVENOUS
  Filled 2023-10-20: qty 10

## 2023-10-20 MED ORDER — CIPROFLOXACIN HCL 500 MG PO TABS
500.0000 mg | ORAL_TABLET | Freq: Two times a day (BID) | ORAL | 0 refills | Status: DC
Start: 2023-10-20 — End: 2023-11-23

## 2023-10-20 NOTE — Patient Instructions (Signed)
 [facility] - A DEPT OF [facility]  Discharge Instructions: Thank you for choosing [facility] to provide your oncology and hematology care.  If you have a lab appointment with the Cancer Center, please go directly to the Cancer Center and check in at the registration area.  Wear comfortable clothing and clothing appropriate for easy access to any Portacath or PICC line.   We strive to give you quality time with your provider. You may need to reschedule your appointment if you arrive late (15 or more minutes).  Arriving late affects you and other patients whose appointments are after yours.  Also, if you miss three or more appointments without notifying the office, you may be dismissed from the clinic at the provider's discretion.      For prescription refill requests, have your pharmacy contact our office and allow 72 hours for refills to be completed.    Today you received the following chemotherapy and/or immunotherapy agents IMFINZI      To help prevent nausea and vomiting after your treatment, we encourage you to take your nausea medication as directed.  BELOW ARE SYMPTOMS THAT SHOULD BE REPORTED IMMEDIATELY: *FEVER GREATER THAN 100.4 F (38 C) OR HIGHER *CHILLS OR SWEATING *NAUSEA AND VOMITING THAT IS NOT CONTROLLED WITH YOUR NAUSEA MEDICATION *UNUSUAL SHORTNESS OF BREATH *UNUSUAL BRUISING OR BLEEDING *URINARY PROBLEMS (pain or burning when urinating, or frequent urination) *BOWEL PROBLEMS (unusual diarrhea, constipation, pain near the anus) TENDERNESS IN MOUTH AND THROAT WITH OR WITHOUT PRESENCE OF ULCERS (sore throat, sores in mouth, or a toothache) UNUSUAL RASH, SWELLING OR PAIN  UNUSUAL VAGINAL DISCHARGE OR ITCHING   Items with * indicate a potential emergency and should be followed up as soon as possible or go to the Emergency Department if any problems should occur.  Please show the CHEMOTHERAPY ALERT CARD or IMMUNOTHERAPY  ALERT CARD at check-in to the Emergency Department and triage nurse.  Should you have questions after your visit or need to cancel or reschedule your appointment, please contact [facility] - A DEPT OF [facility]  [phone] and follow the prompts.  Office hours are 8:00 a.m. to 4:30 p.m. Monday - Friday. Please note that voicemails left after 4:00 p.m. may not be returned until the following business day.  We are closed weekends and major holidays. You have access to a nurse at all times for urgent questions. Please call the main number to the clinic [phone] and follow the prompts.  For any non-urgent questions, you may also contact your provider using MyChart. We now offer e-Visits for anyone 30 and older to request care online for non-urgent symptoms. For details visit [email or web address].   Also download the MyChart app! Go to the app store, search "MyChart", open the app, select [facility], and log in with your MyChart username and password.  Durvalumab Injection What is this medication? DURVALUMAB (dur VAL ue mab) treats some types of cancer. It works by helping your immune system slow or stop the spread of cancer cells. It is a monoclonal antibody. This medicine may be used for other purposes; ask your health care provider or pharmacist if you have questions. COMMON BRAND NAME(S): IMFINZI What should I tell my care team before I take this medication? They need to know if you have any of these conditions: Allogeneic stem cell transplant (uses someone else's stem cells) Autoimmune diseases, such as Crohn disease, ulcerative  colitis, lupus History of chest radiation Nervous system problems, such as Guillain-Barre syndrome, myasthenia gravis Organ transplant An unusual or allergic reaction to durvalumab, other medications, foods, dyes, or preservatives Pregnant or trying to get pregnant Breast-feeding How should I use this medication? This  medication is infused into a vein. It is given by your care team in a hospital or clinic setting. A special MedGuide will be given to you before each treatment. Be sure to read this information carefully each time. Talk to your care team about the use of this medication in children. Special care may be needed. Overdosage: If you think you have taken too much of this medicine contact a poison control center or emergency room at once. NOTE: This medicine is only for you. Do not share this medicine with others. What if I miss a dose? Keep appointments for follow-up doses. It is important not to miss your dose. Call your care team if you are unable to keep an appointment. What may interact with this medication? Interactions have not been studied. This list may not describe all possible interactions. Give your health care provider a list of all the medicines, herbs, non-prescription drugs, or dietary supplements you use. Also tell them if you smoke, drink alcohol, or use illegal drugs. Some items may interact with your medicine. What should I watch for while using this medication? Your condition will be monitored carefully while you are receiving this medication. You may need blood work while taking this medication. This medication may cause serious skin reactions. They can happen weeks to months after starting the medication. Contact your care team right away if you notice fevers or flu-like symptoms with a rash. The rash may be red or purple and then turn into blisters or peeling of the skin. You may also notice a red rash with swelling of the face, lips, or lymph nodes in your neck or under your arms. Tell your care team right away if you have any change in your eyesight. Talk to your care team if you may be pregnant. Serious birth defects can occur if you take this medication during pregnancy and for 3 months after the last dose. You will need a negative pregnancy test before starting this medication.  Contraception is recommended while taking this medication and for 3 months after the last dose. Your care team can help you find the option that works for you. Do not breastfeed while taking this medication and for 3 months after the last dose. What side effects may I notice from receiving this medication? Side effects that you should report to your care team as soon as possible: Allergic reactions--skin rash, itching, hives, swelling of the face, lips, tongue, or throat Dry cough, shortness of breath or trouble breathing Eye pain, redness, irritation, or discharge with blurry or decreased vision Heart muscle inflammation--unusual weakness or fatigue, shortness of breath, chest pain, fast or irregular heartbeat, dizziness, swelling of the ankles, feet, or hands Hormone gland problems--headache, sensitivity to light, unusual weakness or fatigue, dizziness, fast or irregular heartbeat, increased sensitivity to cold or heat, excessive sweating, constipation, hair loss, increased thirst or amount of urine, tremors or shaking, irritability Infusion reactions--chest pain, shortness of breath or trouble breathing, feeling faint or lightheaded Kidney injury (glomerulonephritis)--decrease in the amount of urine, red or dark brown urine, foamy or bubbly urine, swelling of the ankles, hands, or feet Liver injury--right upper belly pain, loss of appetite, nausea, light-colored stool, dark yellow or brown urine, yellowing skin or eyes,  unusual weakness or fatigue Pain, tingling, or numbness in the hands or feet, muscle weakness, change in vision, confusion or trouble speaking, loss of balance or coordination, trouble walking, seizures Rash, fever, and swollen lymph nodes Redness, blistering, peeling, or loosening of the skin, including inside the mouth Sudden or severe stomach pain, bloody diarrhea, fever, nausea, vomiting Side effects that usually do not require medical attention (report these to your care team  if they continue or are bothersome): Bone, joint, or muscle pain Diarrhea Fatigue Loss of appetite Nausea Skin rash This list may not describe all possible side effects. Call your doctor for medical advice about side effects. You may report side effects to FDA at 1-800-FDA-1088. Where should I keep my medication? This medication is given in a hospital or clinic. It will not be stored at home. NOTE: This sheet is a summary. It may not cover all possible information. If you have questions about this medicine, talk to your doctor, pharmacist, or health care provider.  2024 [employer] Standard (2021-05-26 00:00:00)

## 2023-10-20 NOTE — Assessment & Plan Note (Addendum)
#  History of stage Ib right lung upper lobe adenocarcinoma status post wedge resection in 2016 History of left lower lobe adenocarcinoma T1 a NX status post wedge resection 2019. Recurrence - Biopsy of pretracheal lymph node positive for NSCLC.  MRI brain is negative.  Recommend concurrent chemotherapy [carboplatin  taxol ]  with radiation.  Labs are reviewed and discussed with patient. S/p concurrent carboplatin  AUC2, taxol  45mg /m with Radiation.   Interval CT scan showed partial response Recommend immunotherapy consolidation with Durvalumab  Q2weeks for up to a year.  Labs are reviewed and discussed with patient. Proceed with Durvalumab  Plan to repeat CT chest in October/November

## 2023-10-20 NOTE — Assessment & Plan Note (Addendum)
 UA and Urine culture UA showed leukocyte esterase positive, positive nitrate. Will start patient on empiric Cipro .  Awaiting for urine culture

## 2023-10-20 NOTE — Assessment & Plan Note (Signed)
Same plan as above.  

## 2023-10-20 NOTE — Progress Notes (Signed)
 Per Dr babara ok to maintain dose at 500mg  imfinzi  10/20/23

## 2023-10-20 NOTE — Assessment & Plan Note (Signed)
 Immunotherapy plan as listed above

## 2023-10-20 NOTE — Progress Notes (Signed)
 Hematology/Oncology Progress note [phone] [phone]     Chief Complaint:  Follow up for recurrent non small cell lung cancer   ASSESSMENT & PLAN:  Heather [name] is a 88 y.o. Heather [name] female with stage IB adenocarcinoma of the right upper lobe status post wedge resection on 03/13/2014. T2aN1M0 (stage IB).  She is s/p wedge resection of a left lower lobe mass on 03/10/2017. T1aNx (stage IA). No with locally recurrent NSCLC   Malignant neoplasm of lower lobe of left lung (HCC) #History of stage Ib right lung upper lobe adenocarcinoma status post wedge resection in 2016 History of left lower lobe adenocarcinoma T1 a NX status post wedge resection 2019. Recurrence - Biopsy of pretracheal lymph node positive for NSCLC.  MRI brain is negative.  Recommend concurrent chemotherapy [carboplatin  taxol ]  with radiation.  Labs are reviewed and discussed with patient. S/p concurrent carboplatin  AUC2, taxol  45mg /m with Radiation.   Interval CT scan showed partial response Recommend immunotherapy consolidation with Durvalumab  Q2weeks for up to a year.  Labs are reviewed and discussed with patient. Proceed with Durvalumab  Plan to repeat CT chest in October/November    Encounter for antineoplastic immunotherapy Immunotherapy plan as listed above.  Primary cancer of right upper lobe of lung [facility]) Same plan as above.   UTI (urinary tract infection) UA and Urine culture UA showed leukocyte esterase positive, positive nitrate. Will start patient on empiric Cipro .  Awaiting for urine culture    Orders Placed This Encounter  Procedures   Urine Culture    Standing Status:   Future    Number of Occurrences:   1    Expected Date:   10/20/2023    Expiration Date:   10/19/2024   CBC with Differential (Cancer Center Only)    Standing Status:   Future    Expected Date:   11/17/2023    Expiration Date:   11/16/2024   CMP (Cancer Center only)    Standing Status:   Future     Expected Date:   11/17/2023    Expiration Date:   11/16/2024   T4    Standing Status:   Future    Expected Date:   11/17/2023    Expiration Date:   11/16/2024   TSH    Standing Status:   Future    Expected Date:   11/17/2023    Expiration Date:   11/16/2024   CBC with Differential (Cancer Center Only)    Standing Status:   Future    Expected Date:   12/01/2023    Expiration Date:   11/30/2024   CMP (Cancer Center only)    Standing Status:   Future    Expected Date:   12/01/2023    Expiration Date:   11/30/2024   T4    Standing Status:   Future    Expected Date:   12/01/2023    Expiration Date:   11/30/2024   TSH    Standing Status:   Future    Expected Date:   12/01/2023    Expiration Date:   11/30/2024   Urinalysis, Complete w Microscopic    Standing Status:   Future    Number of Occurrences:   1    Expected Date:   10/20/2023    Expiration Date:   10/19/2024   Follow up 2 weeks. All questions were answered. The patient knows to call the clinic with any problems, questions or concerns.  We spent sufficient time to discuss many aspect of  care, questions were answered to patient's satisfaction.   [name], MD, PhD [facility] Hematology Oncology 10/20/2023   PERTINENT ONCOLOGY HISTORY Previously follows up with Dr.Corcoran. Estasblish care with me on 03/15/2018  Oncology History  Primary cancer of right upper lobe of lung (HCC)  03/13/2014 Initial Diagnosis   Primary cancer of right upper lobe of lung   PET scan on 02/13/2014 revealed a 1 cm right upper lobe hypermetabolic nodule (SUV 3.8) and a 6 mm right upper lobe nodule (no metabolic activity). There was no mediastianl adenopathy.  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 Pathology revealed a 1.1 cm high-grade adenocarcinoma with pleural invasion. Nodes were negative. Pathologic stage was T2aN1M0 (stage IB).     03/30/2023 Imaging   PET  1. Hypermetabolic pretracheal lymph  node is concerning for metastatic adenopathy. 2. No evidence of local recurrence in the RIGHT upper lobe. 3. No evidence of metastatic disease in the abdomen pelvis. 4. Small nodule along the LEFT oblique fissure has metabolic activity; however, nodule has been stable in size since 03/16/2021. Favor benign nodule. 5. LEFT hydronephrosis with double-J ureteral stent in place.   05/16/2023 - 06/27/2023 Chemotherapy   Patient is on Treatment Plan : Carboplatin  + Paclitaxel  Weekly X 6 Weeks with XRT     07/31/2023 Imaging   CT chest wo contrast   1. Interval decrease in size of the precarinal lymph node seen on prior studies. This lymph node is seen to be hypermetabolic on PET-CT 03/30/2023. Close continued attention warranted. 2. Stable 8 mm retro hilar left lung nodule. Additional scattered smaller pulmonary nodules bilaterally are unchanged. 3. Stable focus of architectural distortion in the medial left upper lobe with a 5 mm nodular component. 4. Aortic Atherosclerosis (ICD10-I70.0) and Emphysema (ICD10-J43.9).   08/04/2023 -  Chemotherapy   Patient is on Treatment Plan : LUNG Durvalumab  (10) q14d     Malignant neoplasm of lower lobe of left lung (HCC)  03/10/2017 Initial Diagnosis   Malignant neoplasm of lower lobe of left lung   Chest CT on 02/10/2017 revealed clear interval progression of the posterior left lower lobe pulmonary nodule, now measuring 12 mm in long axis and having a distinctly lobular contour. Imaging features were very concerning for neoplasm.  There was no change in the 7 mm nodule posterior to the right hilum.   PET scan on 02/18/2017 revealed a 11 x 8 mm posterior left lower lobe pulmonary nodule that had mildly progressed from prior studies and demonstrated mild hypermetabolism (SUV 1.6).  The appearance was considered worrisome for an indolent primary bronchogenic neoplasm.   03/10/2017.  s/p wedge resection of a left lower lobe mass.   Pathology revealed an 8 mm  invasive adenocarcinoma with solid and acinar patterns.  There was no visceral invasion.  There was no lymphovascular invasion.  All margins were negative.  Pathologic stage was T1aNx (stage IA).   03/30/2023 Imaging   PET  1. Hypermetabolic pretracheal lymph node is concerning for metastatic adenopathy. 2. No evidence of local recurrence in the RIGHT upper lobe. 3. No evidence of metastatic disease in the abdomen pelvis. 4. Small nodule along the LEFT oblique fissure has metabolic activity; however, nodule has been stable in size since 03/16/2021. Favor benign nodule. 5. LEFT hydronephrosis with double-J ureteral stent in place.   04/27/2023 Cancer Staging   Staging form: Lung, AJCC 8th Edition - Pathologic stage from 04/27/2023: pN2, cM0 - Signed by Cap Zelphia, MD  on 04/27/2023 Stage prefix: Recurrence   05/16/2023 - 06/27/2023 Chemotherapy   Patient is on Treatment Plan : Carboplatin  + Paclitaxel  Weekly X 6 Weeks with XRT     07/31/2023 Imaging   CT chest wo contrast   1. Interval decrease in size of the precarinal lymph node seen on prior studies. This lymph node is seen to be hypermetabolic on PET-CT 03/30/2023. Close continued attention warranted. 2. Stable 8 mm retro hilar left lung nodule. Additional scattered smaller pulmonary nodules bilaterally are unchanged. 3. Stable focus of architectural distortion in the medial left upper lobe with a 5 mm nodular component. 4. Aortic Atherosclerosis (ICD10-I70.0) and Emphysema (ICD10-J43.9).   08/04/2023 -  Chemotherapy   Patient is on Treatment Plan : LUNG Durvalumab  (10) q14d       INTERVAL HISTORY Heather [name] is a 88 y.o. female who has above history reviewed by me today presents for follow up visit for management of recurrent lung cancer, .  Patient reports feeling well.  She was accompanied by her husband Denies shortness of breath, fatigue, hemoptysis, unintentional weight loss. She reports mild burning sensation during urination.   No fever, flank pain.   Past Medical History:  Diagnosis Date   Anginal pain    Benign essential hypertension    Brain aneurysm    Carotid artery stenosis    Carotid stenosis 06/19/2013   Colonic polyp    Emphysema lung (HCC)    GERD (gastroesophageal reflux disease)    Hiatal hernia    Hypercholesteremia    Hyperlipidemia, mixed 06/09/2015   Lung cancer (HCC) 2016   Lung nodule 06/21/2014   Macrocytic 10/06/2014   Multiple thyroid  nodules    PAT (paroxysmal atrial tachycardia) 06/19/2013   Plantar fasciitis     Past Surgical History:  Procedure Laterality Date   BREAST EXCISIONAL BIOPSY Right 40 yrs ago   neg   BREAST LUMPECTOMY     benign   CATARACT EXTRACTION W/ INTRAOCULAR LENS  IMPLANT, BILATERAL Bilateral    CEREBRAL ANEURYSM REPAIR  2004   coil treatment   CERVIX LESION DESTRUCTION     COLONOSCOPY     1995, 2006, 2014   COLONOSCOPY WITH PROPOFOL  N/A 03/31/2015   Procedure: COLONOSCOPY WITH PROPOFOL ;  Surgeon: Lamar ONEIDA Holmes, MD;  Location: [facility] END[facility]COPY;  Service: Endoscopy;  Laterality: N/A;   CYSTOSCOPY W/ RETROGRADES Left 03/02/2022   Procedure: CYSTOSCOPY WITH RETROGRADE PYELOGRAM;  Surgeon: Twylla Glendia JAYSON, MD;  Location: [facility] ORS;  Service: Urology;  Laterality: Left;   CYSTOSCOPY W/ RETROGRADES Left 12/28/2022   Procedure: CYSTOSCOPY WITH RETROGRADE PYELOGRAM;  Surgeon: Twylla Glendia JAYSON, MD;  Location: [facility] ORS;  Service: Urology;  Laterality: Left;   CYSTOSCOPY W/ URETERAL STENT PLACEMENT Left 03/03/2021   Procedure: CYSTOSCOPY WITH EXCHANGE;  Surgeon: Twylla Glendia JAYSON, MD;  Location: [facility] ORS;  Service: Urology;  Laterality: Left;   CYSTOSCOPY W/ URETERAL STENT PLACEMENT Left 03/02/2022   Procedure: CYSTOSCOPY WITH STENT EXCHANGE;  Surgeon: Twylla Glendia JAYSON, MD;  Location: [facility] ORS;  Service: Urology;  Laterality: Left;   CYSTOSCOPY W/ URETERAL STENT PLACEMENT Left 12/28/2022   Procedure: CYSTOSCOPY WITH STENT EXCHANGE;  Surgeon: Twylla Glendia JAYSON,  MD;  Location: [facility] OR[facility];  Service: Urology;  Laterality: Left;   CYSTOSCOPY WITH STENT PLACEMENT Left 11/25/2020   Procedure: CYSTOSCOPY WITH STENT PLACEMENT;  Surgeon: Twylla Glendia JAYSON, MD;  Location: [facility] ORS;  Service: Urology;  Laterality: Left;   ENDOBRONCHIAL ULTRASOUND Bilateral 04/19/2023   Procedure: ENDOBRONCHIAL ULTRASOUND (EBUS);  Surgeon: Isadora Hose, MD;  Location: [facility];  Service: Pulmonary;  Laterality: Bilateral;   FLEXIBLE BRONCHOSCOPY N/A 03/10/2017   Procedure: FLEXIBLE BRONCHOSCOPY;  Surgeon: Volney Lye, MD;  Location: [facility] ORS;  Service: Thoracic;  Laterality: N/A;   THORACOSCOPY WITH WEDGE RESECTION LUNG Right 03/13/2014   RUL   THORACOTOMY/LOBECTOMY Left 03/10/2017   Procedure: THORACOTOMY WITH LUNG WEDGE RESECTION POSSIBLE LOBECTOMY;  Surgeon: Volney Lye, MD;  Location: [facility] OR[facility];  Service: Thoracic;  Laterality: Left;   TUBAL LIGATION      Family History  Problem Relation Age of Onset   Alcohol abuse Mother    Heart attack Father    Breast cancer Neg Hx     Social History:  reports that she quit smoking about 35 years ago. Her smoking use included cigarettes. She started smoking about 70 years ago. She has a 35 pack-year smoking history. She has never used smokeless tobacco. She reports current alcohol use. She reports that she does not use drugs.   Allergies:  Allergies  Allergen Reactions   Ace Inhibitors Swelling   Contrast Media [Iodinated Contrast Media]    Cortisone     Patient had facial swelling and itching from cortisone injection IM    Current Medications: Current Outpatient Medications  Medication Sig Dispense Refill   amLODipine  (NORVASC ) 5 MG tablet Take 5 mg by mouth at bedtime.     amoxicillin -clavulanate (AUGMENTIN) 875-125 MG tablet Take by mouth.     atenolol  (TENORMIN ) 50 MG tablet Take 50 mg by mouth at bedtime.     calcium -vitamin D  (OSCAL WITH D) 500-200 MG-UNIT tablet Take 1 tablet by mouth daily with breakfast.      cetirizine (ZYRTEC) 10 MG tablet Take 10 mg by mouth daily.     dextromethorphan-guaiFENesin  (MUCINEX  DM) 30-600 MG 12hr tablet Take 1 tablet by mouth 2 (two) times daily as needed for cough.     donepezil  (ARICEPT ) 10 MG tablet Take 1 tablet by mouth at bedtime.     fluticasone  (FLONASE ) 50 MCG/ACT nasal spray Place 1 spray into the nose daily as needed for allergies.      nystatin  (MYCOSTATIN ) 100000 UNIT/ML suspension Take 5 mLs (500,000 Units total) by mouth 4 (four) times daily. 473 mL 0   OLANZapine  (ZYPREXA ) 5 MG tablet Take 1 tablet (5 mg total) by mouth at bedtime. For appetite, sleep, and nausea 30 tablet 2   simvastatin  (ZOCOR ) 20 MG tablet Take 20 mg by mouth every morning.     sucralfate  (CARAFATE ) 1 GM/10ML suspension Take 10 mLs (1 g total) by mouth 4 (four) times daily -  with meals and at bedtime. For radiation esophagitis 420 mL 1   trospium  (SANCTURA ) 20 MG tablet Take 1 tablet (20 mg total) by mouth 2 (two) times daily. 30 tablet 1   vitamin B-12 (CYANOCOBALAMIN ) 100 MCG tablet Take 100 mcg by mouth daily.     No current facility-administered medications for this visit.   Review of Systems  Constitutional:  Negative for appetite change, chills, fatigue and fever.  HENT:   Positive for hearing loss. Negative for voice change.   Eyes:  Negative for eye problems.  Respiratory:  Negative for chest tightness and cough.        Congestion  Cardiovascular:  Negative for chest pain.  Gastrointestinal:  Negative for abdominal distention, abdominal pain, blood in stool and nausea.  Endocrine: Negative for hot flashes.  Genitourinary:  Negative for difficulty urinating and frequency.   Musculoskeletal:  Negative  for arthralgias.       Chronic intermittent back pain  Skin:  Negative for itching and rash.  Neurological:  Negative for extremity weakness.  Hematological:  Negative for adenopathy.  Psychiatric/Behavioral:  Negative for confusion.     Performance status (ECOG): 0 -  Asymptomatic  Vital Signs BP 137/67 (BP Location: Left Arm, Patient Position: Sitting, Cuff Size: Normal)   Pulse 77   Temp (!) 96.8 F (36 C) (Tympanic)   Resp 18   Wt 125 lb 9.6 oz (57 kg)   SpO2 95%   BMI 22.97 kg/m   Physical Exam Constitutional:      General: She is not in acute distress.    Appearance: She is not diaphoretic.  HENT:     Head: Normocephalic and atraumatic.  Eyes:     General: No scleral icterus. Cardiovascular:     Rate and Rhythm: Normal rate and regular rhythm.  Pulmonary:     Effort: Pulmonary effort is normal. No respiratory distress.  Abdominal:     General: There is no distension.     Palpations: Abdomen is soft.     Tenderness: There is no abdominal tenderness.  Musculoskeletal:        General: Normal range of motion.     Cervical back: Normal range of motion and neck supple.  Skin:    General: Skin is dry.     Findings: No erythema.  Neurological:     Mental Status: She is alert and oriented to person, place, and time. Mental status is at baseline.  Psychiatric:        Mood and Affect: Mood and affect normal.     RADIOGRAPHIC STUDIES: I have personally reviewed the radiological images as listed and agreed with the findings in the report. Abdomen 1 view (KUB) Result Date: 10/13/2023 EXAM: 1 VIEW XRAY OF THE ABDOMEN 10/12/2023 12:39:00 PM COMPARISON: CT AP 07/11/2023 CLINICAL HISTORY: Kidney stone. Kidney stones. FINDINGS: LINES, TUBES AND DEVICES: Left ureteral stent in place. BOWEL: Nonobstructive bowel gas pattern. SOFT TISSUES: Right upper quadrant calcifications, likely vascular. BONES: Degenerative changes of the lumbar spine. No acute osseous abnormality. IMPRESSION: 1. Left ureteral stent in place. No urinary tract calculi identified. 2. Right upper quadrant calcifications, likely vascular. 3. Degenerative changes of the lumbar spine. Electronically signed by: Waddell Calk MD 10/13/2023 04:10 PM EDT RP Workstation: [id number]   CT  Chest Wo Contrast Result Date: 07/31/2023 CLINICAL DATA:  Non-small-cell lung cancer. Restaging. * Tracking Code: BO * EXAM: CT CHEST WITHOUT CONTRAST TECHNIQUE: Multidetector CT imaging of the chest was performed following the standard protocol without IV contrast. RADIATION DOSE REDUCTION: This exam was performed according to the departmental dose-optimization program which includes automated exposure control, adjustment of the mA and/or kV according to patient size and/or use of iterative reconstruction technique. COMPARISON:  PET-CT 03/30/2023.  Chest CT 03/18/2023. FINDINGS: Cardiovascular: The heart size is normal. No substantial pericardial effusion. Coronary artery calcification is evident. Moderate atherosclerotic calcification is noted in the wall of the thoracic aorta. Mediastinum/Nodes: 13 mm short axis precarinal lymph node seen on prior studies is smaller in the interval measuring 10 mm short axis today on 54/2. No other mediastinal lymphadenopathy. No evidence for gross hilar lymphadenopathy although assessment is limited by the lack of intravenous contrast on the current study. Tiny hiatal hernia. The esophagus has normal imaging features. There is no axillary lymphadenopathy. Lungs/Pleura: Centrilobular and paraseptal emphysema evident. Biapical pleuroparenchymal scarring evident. Focus of architectural distortion in the medial left  upper lobe a nodular component measured previously at 5 mm stable at 5 mm today (29/4). 8 mm retro hilar left lung nodule measured previously is stable at 8 mm today (53/4). Subtle ground-glass opacity posterior right lower lobe stable on 78/4. No new suspicious pulmonary nodule or mass. No focal airspace consolidation. No pleural effusion. Surgical scarring noted posterior left lower lobe, stable. Upper Abdomen: Scattered tiny hypodensities in the liver parenchyma are too small to characterize but are statistically most likely benign. No followup imaging is recommended.  Musculoskeletal: No worrisome lytic or sclerotic osseous abnormality. IMPRESSION: 1. Interval decrease in size of the precarinal lymph node seen on prior studies. This lymph node is seen to be hypermetabolic on PET-CT 03/30/2023. Close continued attention warranted. 2. Stable 8 mm retro hilar left lung nodule. Additional scattered smaller pulmonary nodules bilaterally are unchanged. 3. Stable focus of architectural distortion in the medial left upper lobe with a 5 mm nodular component. 4. Aortic Atherosclerosis (ICD10-I70.0) and Emphysema (ICD10-J43.9). Electronically Signed   By: Camellia Candle M.D.   On: 07/31/2023 10:28    Laboratory results were reviewed by me    Latest Ref Rng & Units 10/20/2023   10:37 AM 10/06/2023   12:51 PM 09/15/2023    1:04 PM  CBC  WBC 4.0 - 10.5 K/uL 5.6  6.0  5.8   Hemoglobin 12.0 - 15.0 g/dL 84.8  85.8  86.2   Hematocrit 36.0 - 46.0 % 45.1  42.7  41.1   Platelets 150 - 400 K/uL 180  163  178       Latest Ref Rng & Units 10/20/2023   10:37 AM 10/06/2023   12:51 PM 09/15/2023    1:03 PM  CMP  Glucose 70 - 99 mg/dL 890  85  892   BUN 8 - 23 mg/dL 21  19  23    Creatinine 0.44 - 1.00 mg/dL 9.19  9.14  9.12   Sodium 135 - 145 mmol/L 136  136  138   Potassium 3.5 - 5.1 mmol/L 3.9  4.1  4.1   Chloride 98 - 111 mmol/L 103  104  107   CO2 22 - 32 mmol/L 24  21  25    Calcium  8.9 - 10.3 mg/dL 9.3  9.1  9.1   Total Protein 6.5 - 8.1 g/dL 6.9  6.7  6.6   Total Bilirubin 0.0 - 1.2 mg/dL 0.6  0.6  0.7   Alkaline Phos 38 - 126 U/L 60  58  59   AST 15 - 41 U/L 22  19  22    ALT 0 - 44 U/L 11  12  16

## 2023-10-21 ENCOUNTER — Other Ambulatory Visit: Payer: Self-pay

## 2023-10-21 NOTE — Progress Notes (Signed)
 Spoke to patient and she has been informed of Cipro  rx sent to [facility].

## 2023-10-22 LAB — URINE CULTURE: Culture: >=100000 — AB

## 2023-10-23 ENCOUNTER — Encounter: Payer: Self-pay | Admitting: Urology

## 2023-10-23 ENCOUNTER — Other Ambulatory Visit: Payer: Self-pay

## 2023-10-24 ENCOUNTER — Other Ambulatory Visit: Payer: Self-pay

## 2023-10-24 DIAGNOSIS — N135 Crossing vessel and stricture of ureter without hydronephrosis: Secondary | ICD-10-CM

## 2023-10-24 NOTE — Progress Notes (Unsigned)
 Surgical Physician Order Form [facility] [place]  Dr. Glendia Barba, MD  * Scheduling expectation : early December 2025  *Length of Case: 30 min  *Clearance needed: per anesthesia  *Anticoagulation Instructions: N/A  *Aspirin Instructions: N/A  *Post-op visit Date/Instructions:  3 month follow up  *Diagnosis: Left UPJ obstruction  *Procedure: Cysto w/stent exchange (47667)- Left; left retrograde pyelogram   Additional orders: N/A  -Admit type: OUTpatient  -Anesthesia: MAC  -VTE Prophylaxis Standing Order SCD's       Other:   -Standing Lab Orders Per Anesthesia    Lab other: UA&Urine Culture  -Standing Test orders EKG/Chest x-ray per Anesthesia       Test other:   - Medications:  Ancef  2gm IV  -Other orders:  N/A

## 2023-10-25 ENCOUNTER — Other Ambulatory Visit: Payer: Self-pay

## 2023-10-26 ENCOUNTER — Telehealth: Payer: Self-pay

## 2023-10-26 NOTE — Telephone Encounter (Signed)
 Per Dr. Twylla, Patient is to be scheduled for  Left Cystoscopy with Stent Exchange and Left Retrograde Pyelogram    Mrs. Amparo was contacted and possible surgical dates were discussed, Thursday December 4th, 2025 was agreed upon for surgery.   Patient was instructed that Dr. Twylla will require them to provide a pre-op UA & CX prior to surgery. This was ordered and scheduled drop off appointment was made for 12/15/2023.    Patient was directed to call [phone] between 1-3pm the day before surgery to find out surgical arrival time.  Instructions were given not to eat or drink from midnight on the night before surgery and have a driver for the day of surgery. On the surgery day patient was instructed to enter through the [facility] entrance of [facility] report the Same Day Surgery desk.   Pre-Admit Testing will be in contact via phone to set up an interview with the anesthesia team to review your history and medications prior to surgery.   Reminder of this information was sent via Mail to the patient.

## 2023-10-26 NOTE — Progress Notes (Signed)
   [facility] Surgical Posting Form  Surgery Date: Date: 12/29/2023  Surgeon: Dr. Glendia Barba, MD  Inpt ( No  )   Outpt (Yes)   Obs ( No  )   Diagnosis: N13.5 Left Ureteropelvic Junction Obstruction  -CPT: 47667, [phone]  Surgery: Left Cystoscopy with Stent Exchange and Left Retrograde Pyelogram  Stop Anticoagulations: No  Cardiac/Medical/Pulmonary Clearance needed: No  *Orders entered into EPIC  Date: 10/26/23   *Case booked in EPIC  Date: 10/26/23  *Notified pt of Surgery: Date: 10/26/23  PRE-OP UA & CX: yes, will obtain in clinic on 12/29/2023  *Placed into Prior Authorization Work [place] Date: 10/26/23  Assistant/laser/rep:No

## 2023-11-03 ENCOUNTER — Inpatient Hospital Stay

## 2023-11-03 ENCOUNTER — Inpatient Hospital Stay: Attending: Oncology

## 2023-11-03 ENCOUNTER — Encounter: Payer: Self-pay | Admitting: Oncology

## 2023-11-03 ENCOUNTER — Inpatient Hospital Stay: Admitting: Oncology

## 2023-11-03 VITALS — BP 140/66 | HR 75 | Temp 96.9°F | Resp 18 | Wt 127.4 lb

## 2023-11-03 VITALS — BP 118/76

## 2023-11-03 DIAGNOSIS — C3432 Malignant neoplasm of lower lobe, left bronchus or lung: Secondary | ICD-10-CM | POA: Diagnosis not present

## 2023-11-03 DIAGNOSIS — Z5112 Encounter for antineoplastic immunotherapy: Secondary | ICD-10-CM | POA: Diagnosis not present

## 2023-11-03 DIAGNOSIS — Z803 Family history of malignant neoplasm of breast: Secondary | ICD-10-CM | POA: Insufficient documentation

## 2023-11-03 DIAGNOSIS — K219 Gastro-esophageal reflux disease without esophagitis: Secondary | ICD-10-CM | POA: Insufficient documentation

## 2023-11-03 DIAGNOSIS — Z87891 Personal history of nicotine dependence: Secondary | ICD-10-CM | POA: Diagnosis not present

## 2023-11-03 DIAGNOSIS — C3411 Malignant neoplasm of upper lobe, right bronchus or lung: Secondary | ICD-10-CM

## 2023-11-03 DIAGNOSIS — C770 Secondary and unspecified malignant neoplasm of lymph nodes of head, face and neck: Secondary | ICD-10-CM | POA: Diagnosis present

## 2023-11-03 DIAGNOSIS — Z860100 Personal history of colon polyps, unspecified: Secondary | ICD-10-CM | POA: Insufficient documentation

## 2023-11-03 DIAGNOSIS — I700 Atherosclerosis of aorta: Secondary | ICD-10-CM | POA: Diagnosis not present

## 2023-11-03 DIAGNOSIS — E782 Mixed hyperlipidemia: Secondary | ICD-10-CM | POA: Insufficient documentation

## 2023-11-03 DIAGNOSIS — N136 Pyonephrosis: Secondary | ICD-10-CM | POA: Insufficient documentation

## 2023-11-03 DIAGNOSIS — J439 Emphysema, unspecified: Secondary | ICD-10-CM | POA: Insufficient documentation

## 2023-11-03 DIAGNOSIS — Z79899 Other long term (current) drug therapy: Secondary | ICD-10-CM | POA: Insufficient documentation

## 2023-11-03 DIAGNOSIS — I10 Essential (primary) hypertension: Secondary | ICD-10-CM | POA: Insufficient documentation

## 2023-11-03 LAB — CBC WITH DIFFERENTIAL (CANCER CENTER ONLY)
Abs Immature Granulocytes: 0.02 K/uL (ref 0.00–0.07)
Basophils Absolute: 0 K/uL (ref 0.0–0.1)
Basophils Relative: 0 %
Eosinophils Absolute: 0.2 K/uL (ref 0.0–0.5)
Eosinophils Relative: 3 %
HCT: 44.5 % (ref 36.0–46.0)
Hemoglobin: 14.8 g/dL (ref 12.0–15.0)
Immature Granulocytes: 0 %
Lymphocytes Relative: 17 %
Lymphs Abs: 1 K/uL (ref 0.7–4.0)
MCH: 32.5 pg (ref 26.0–34.0)
MCHC: 33.3 g/dL (ref 30.0–36.0)
MCV: 97.6 fL (ref 80.0–100.0)
Monocytes Absolute: 0.5 K/uL (ref 0.1–1.0)
Monocytes Relative: 8 %
Neutro Abs: 4.1 K/uL (ref 1.7–7.7)
Neutrophils Relative %: 72 %
Platelet Count: 215 K/uL (ref 150–400)
RBC: 4.56 MIL/uL (ref 3.87–5.11)
RDW: 11.9 % (ref 11.5–15.5)
WBC Count: 5.8 K/uL (ref 4.0–10.5)
nRBC: 0 % (ref 0.0–0.2)

## 2023-11-03 LAB — CMP (CANCER CENTER ONLY)
ALT: 12 U/L (ref 0–44)
AST: 23 U/L (ref 15–41)
Albumin: 3.7 g/dL (ref 3.5–5.0)
Alkaline Phosphatase: 60 U/L (ref 38–126)
Anion gap: 9 (ref 5–15)
BUN: 19 mg/dL (ref 8–23)
CO2: 24 mmol/L (ref 22–32)
Calcium: 9.1 mg/dL (ref 8.9–10.3)
Chloride: 103 mmol/L (ref 98–111)
Creatinine: 0.92 mg/dL (ref 0.44–1.00)
GFR, Estimated: >60 mL/min (ref >60)
Glucose, Bld: 99 mg/dL (ref 70–99)
Potassium: 4.2 mmol/L (ref 3.5–5.1)
Sodium: 136 mmol/L (ref 135–145)
Total Bilirubin: 0.7 mg/dL (ref 0.0–1.2)
Total Protein: 6.8 g/dL (ref 6.5–8.1)

## 2023-11-03 LAB — TSH: TSH: 2.482 u[IU]/mL (ref 0.350–4.500)

## 2023-11-03 MED ORDER — SODIUM CHLORIDE 0.9 % IV SOLN
10.0000 mg/kg | Freq: Once | INTRAVENOUS | Status: AC
  Administered 2023-11-03: 500 mg via INTRAVENOUS
  Filled 2023-11-03: qty 10

## 2023-11-03 MED ORDER — SODIUM CHLORIDE 0.9 % IV SOLN
INTRAVENOUS | Status: DC
  Filled 2023-11-03: qty 250

## 2023-11-03 NOTE — Patient Instructions (Signed)
 [facility] - A DEPT OF [facility]  Discharge Instructions: Thank you for choosing [facility] to provide your oncology and hematology care.  If you have a lab appointment with the Cancer Center, please go directly to the Cancer Center and check in at the registration area.  Wear comfortable clothing and clothing appropriate for easy access to any Portacath or PICC line.   We strive to give you quality time with your provider. You may need to reschedule your appointment if you arrive late (15 or more minutes).  Arriving late affects you and other patients whose appointments are after yours.  Also, if you miss three or more appointments without notifying the office, you may be dismissed from the clinic at the provider's discretion.      For prescription refill requests, have your pharmacy contact our office and allow 72 hours for refills to be completed.    Today you received the following chemotherapy and/or immunotherapy agents IMFINZI      To help prevent nausea and vomiting after your treatment, we encourage you to take your nausea medication as directed.  BELOW ARE SYMPTOMS THAT SHOULD BE REPORTED IMMEDIATELY: *FEVER GREATER THAN 100.4 F (38 C) OR HIGHER *CHILLS OR SWEATING *NAUSEA AND VOMITING THAT IS NOT CONTROLLED WITH YOUR NAUSEA MEDICATION *UNUSUAL SHORTNESS OF BREATH *UNUSUAL BRUISING OR BLEEDING *URINARY PROBLEMS (pain or burning when urinating, or frequent urination) *BOWEL PROBLEMS (unusual diarrhea, constipation, pain near the anus) TENDERNESS IN MOUTH AND THROAT WITH OR WITHOUT PRESENCE OF ULCERS (sore throat, sores in mouth, or a toothache) UNUSUAL RASH, SWELLING OR PAIN  UNUSUAL VAGINAL DISCHARGE OR ITCHING   Items with * indicate a potential emergency and should be followed up as soon as possible or go to the Emergency Department if any problems should occur.  Please show the CHEMOTHERAPY ALERT CARD or IMMUNOTHERAPY  ALERT CARD at check-in to the Emergency Department and triage nurse.  Should you have questions after your visit or need to cancel or reschedule your appointment, please contact [facility] - A DEPT OF [facility]  [phone] and follow the prompts.  Office hours are 8:00 a.m. to 4:30 p.m. Monday - Friday. Please note that voicemails left after 4:00 p.m. may not be returned until the following business day.  We are closed weekends and major holidays. You have access to a nurse at all times for urgent questions. Please call the main number to the clinic [phone] and follow the prompts.  For any non-urgent questions, you may also contact your provider using MyChart. We now offer e-Visits for anyone 30 and older to request care online for non-urgent symptoms. For details visit [email or web address].   Also download the MyChart app! Go to the app store, search "MyChart", open the app, select [facility], and log in with your MyChart username and password.  Durvalumab Injection What is this medication? DURVALUMAB (dur VAL ue mab) treats some types of cancer. It works by helping your immune system slow or stop the spread of cancer cells. It is a monoclonal antibody. This medicine may be used for other purposes; ask your health care provider or pharmacist if you have questions. COMMON BRAND NAME(S): IMFINZI What should I tell my care team before I take this medication? They need to know if you have any of these conditions: Allogeneic stem cell transplant (uses someone else's stem cells) Autoimmune diseases, such as Crohn disease, ulcerative  colitis, lupus History of chest radiation Nervous system problems, such as Guillain-Barre syndrome, myasthenia gravis Organ transplant An unusual or allergic reaction to durvalumab, other medications, foods, dyes, or preservatives Pregnant or trying to get pregnant Breast-feeding How should I use this medication? This  medication is infused into a vein. It is given by your care team in a hospital or clinic setting. A special MedGuide will be given to you before each treatment. Be sure to read this information carefully each time. Talk to your care team about the use of this medication in children. Special care may be needed. Overdosage: If you think you have taken too much of this medicine contact a poison control center or emergency room at once. NOTE: This medicine is only for you. Do not share this medicine with others. What if I miss a dose? Keep appointments for follow-up doses. It is important not to miss your dose. Call your care team if you are unable to keep an appointment. What may interact with this medication? Interactions have not been studied. This list may not describe all possible interactions. Give your health care provider a list of all the medicines, herbs, non-prescription drugs, or dietary supplements you use. Also tell them if you smoke, drink alcohol, or use illegal drugs. Some items may interact with your medicine. What should I watch for while using this medication? Your condition will be monitored carefully while you are receiving this medication. You may need blood work while taking this medication. This medication may cause serious skin reactions. They can happen weeks to months after starting the medication. Contact your care team right away if you notice fevers or flu-like symptoms with a rash. The rash may be red or purple and then turn into blisters or peeling of the skin. You may also notice a red rash with swelling of the face, lips, or lymph nodes in your neck or under your arms. Tell your care team right away if you have any change in your eyesight. Talk to your care team if you may be pregnant. Serious birth defects can occur if you take this medication during pregnancy and for 3 months after the last dose. You will need a negative pregnancy test before starting this medication.  Contraception is recommended while taking this medication and for 3 months after the last dose. Your care team can help you find the option that works for you. Do not breastfeed while taking this medication and for 3 months after the last dose. What side effects may I notice from receiving this medication? Side effects that you should report to your care team as soon as possible: Allergic reactions--skin rash, itching, hives, swelling of the face, lips, tongue, or throat Dry cough, shortness of breath or trouble breathing Eye pain, redness, irritation, or discharge with blurry or decreased vision Heart muscle inflammation--unusual weakness or fatigue, shortness of breath, chest pain, fast or irregular heartbeat, dizziness, swelling of the ankles, feet, or hands Hormone gland problems--headache, sensitivity to light, unusual weakness or fatigue, dizziness, fast or irregular heartbeat, increased sensitivity to cold or heat, excessive sweating, constipation, hair loss, increased thirst or amount of urine, tremors or shaking, irritability Infusion reactions--chest pain, shortness of breath or trouble breathing, feeling faint or lightheaded Kidney injury (glomerulonephritis)--decrease in the amount of urine, red or dark brown urine, foamy or bubbly urine, swelling of the ankles, hands, or feet Liver injury--right upper belly pain, loss of appetite, nausea, light-colored stool, dark yellow or brown urine, yellowing skin or eyes,  unusual weakness or fatigue Pain, tingling, or numbness in the hands or feet, muscle weakness, change in vision, confusion or trouble speaking, loss of balance or coordination, trouble walking, seizures Rash, fever, and swollen lymph nodes Redness, blistering, peeling, or loosening of the skin, including inside the mouth Sudden or severe stomach pain, bloody diarrhea, fever, nausea, vomiting Side effects that usually do not require medical attention (report these to your care team  if they continue or are bothersome): Bone, joint, or muscle pain Diarrhea Fatigue Loss of appetite Nausea Skin rash This list may not describe all possible side effects. Call your doctor for medical advice about side effects. You may report side effects to FDA at 1-800-FDA-1088. Where should I keep my medication? This medication is given in a hospital or clinic. It will not be stored at home. NOTE: This sheet is a summary. It may not cover all possible information. If you have questions about this medicine, talk to your doctor, pharmacist, or health care provider.  2024 [employer] Standard (2021-05-26 00:00:00)

## 2023-11-03 NOTE — Assessment & Plan Note (Addendum)
#  History of stage Ib right lung upper lobe adenocarcinoma status post wedge resection in 2016 History of left lower lobe adenocarcinoma T1 a NX status post wedge resection 2019. Recurrence - Biopsy of pretracheal lymph node positive for NSCLC.  MRI brain is negative.  Recommend concurrent chemotherapy [carboplatin  taxol ]  with radiation.  Labs are reviewed and discussed with patient. S/p concurrent carboplatin  AUC2, taxol  45mg /m with Radiation.   Interval CT scan showed partial response Recommend immunotherapy consolidation with Durvalumab  Q2weeks for up to a year.  Labs are reviewed and discussed with patient. Proceed with Durvalumab  Plan to repeat CT chest in October/November

## 2023-11-03 NOTE — Assessment & Plan Note (Signed)
Same plan as above.  

## 2023-11-03 NOTE — Assessment & Plan Note (Signed)
 Immunotherapy plan as listed above

## 2023-11-03 NOTE — Progress Notes (Signed)
 Hematology/Oncology Progress note [phone] [phone]     Chief Complaint:  Follow up for recurrent non small cell lung cancer   ASSESSMENT & PLAN:  Heather [name] is a 88 y.o. Heather [name] female with stage IB adenocarcinoma of the right upper lobe status post wedge resection on 03/13/2014. T2aN1M0 (stage IB).  She is s/p wedge resection of a left lower lobe mass on 03/10/2017. T1aNx (stage IA). No with locally recurrent NSCLC   Malignant neoplasm of lower lobe of left lung (HCC) #History of stage Ib right lung upper lobe adenocarcinoma status post wedge resection in 2016 History of left lower lobe adenocarcinoma T1 a NX status post wedge resection 2019. Recurrence - Biopsy of pretracheal lymph node positive for NSCLC.  MRI brain is negative.  Recommend concurrent chemotherapy [carboplatin  taxol ]  with radiation.  Labs are reviewed and discussed with patient. S/p concurrent carboplatin  AUC2, taxol  45mg /m with Radiation.   Interval CT scan showed partial response Recommend immunotherapy consolidation with Durvalumab  Q2weeks for up to a year.  Labs are reviewed and discussed with patient. Proceed with Durvalumab  Plan to repeat CT chest in October/November    Encounter for antineoplastic immunotherapy Immunotherapy plan as listed above.  Primary cancer of right upper lobe of lung [facility]) Same plan as above.      Orders Placed This Encounter  Procedures   CT Chest Wo Contrast    Standing Status:   Future    Expected Date:   11/17/2023    Expiration Date:   11/02/2024    Preferred imaging location?:   [facility]   CBC with Differential (Cancer Center Only)    Standing Status:   Future    Expected Date:   12/15/2023    Expiration Date:   12/14/2024   CMP (Cancer Center only)    Standing Status:   Future    Expected Date:   12/15/2023    Expiration Date:   12/14/2024   T4    Standing Status:   Future    Expected Date:   12/15/2023    Expiration Date:    12/14/2024   TSH    Standing Status:   Future    Expected Date:   12/15/2023    Expiration Date:   12/14/2024   CBC with Differential (Cancer Center Only)    Standing Status:   Future    Expected Date:   12/29/2023    Expiration Date:   12/28/2024   CMP (Cancer Center only)    Standing Status:   Future    Expected Date:   12/29/2023    Expiration Date:   12/28/2024   Follow up 2 weeks. All questions were answered. The patient knows to call the clinic with any problems, questions or concerns.  We spent sufficient time to discuss many aspect of care, questions were answered to patient's satisfaction.   [name], MD, PhD [facility] Hematology Oncology 11/03/2023   PERTINENT ONCOLOGY HISTORY Previously follows up with Dr.Corcoran. Estasblish care with me on 03/15/2018  Oncology History  Primary cancer of right upper lobe of lung (HCC)  03/13/2014 Initial Diagnosis   Primary cancer of right upper lobe of lung   PET scan on 02/13/2014 revealed a 1 cm right upper lobe hypermetabolic nodule (SUV 3.8) and a 6 mm right upper lobe nodule (no metabolic activity). There was no mediastianl adenopathy.  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 Pathology revealed a 1.1 cm high-grade adenocarcinoma  with pleural invasion. Nodes were negative. Pathologic stage was T2aN1M0 (stage IB).     03/30/2023 Imaging   PET  1. Hypermetabolic pretracheal lymph node is concerning for metastatic adenopathy. 2. No evidence of local recurrence in the RIGHT upper lobe. 3. No evidence of metastatic disease in the abdomen pelvis. 4. Small nodule along the LEFT oblique fissure has metabolic activity; however, nodule has been stable in size since 03/16/2021. Favor benign nodule. 5. LEFT hydronephrosis with double-J ureteral stent in place.   05/16/2023 - 06/27/2023 Chemotherapy   Patient is on Treatment Plan : Carboplatin  + Paclitaxel  Weekly X 6 Weeks with XRT      07/31/2023 Imaging   CT chest wo contrast   1. Interval decrease in size of the precarinal lymph node seen on prior studies. This lymph node is seen to be hypermetabolic on PET-CT 03/30/2023. Close continued attention warranted. 2. Stable 8 mm retro hilar left lung nodule. Additional scattered smaller pulmonary nodules bilaterally are unchanged. 3. Stable focus of architectural distortion in the medial left upper lobe with a 5 mm nodular component. 4. Aortic Atherosclerosis (ICD10-I70.0) and Emphysema (ICD10-J43.9).   08/04/2023 -  Chemotherapy   Patient is on Treatment Plan : LUNG Durvalumab  (10) q14d     Malignant neoplasm of lower lobe of left lung (HCC)  03/10/2017 Initial Diagnosis   Malignant neoplasm of lower lobe of left lung   Chest CT on 02/10/2017 revealed clear interval progression of the posterior left lower lobe pulmonary nodule, now measuring 12 mm in long axis and having a distinctly lobular contour. Imaging features were very concerning for neoplasm.  There was no change in the 7 mm nodule posterior to the right hilum.   PET scan on 02/18/2017 revealed a 11 x 8 mm posterior left lower lobe pulmonary nodule that had mildly progressed from prior studies and demonstrated mild hypermetabolism (SUV 1.6).  The appearance was considered worrisome for an indolent primary bronchogenic neoplasm.   03/10/2017.  s/p wedge resection of a left lower lobe mass.   Pathology revealed an 8 mm invasive adenocarcinoma with solid and acinar patterns.  There was no visceral invasion.  There was no lymphovascular invasion.  All margins were negative.  Pathologic stage was T1aNx (stage IA).   03/30/2023 Imaging   PET  1. Hypermetabolic pretracheal lymph node is concerning for metastatic adenopathy. 2. No evidence of local recurrence in the RIGHT upper lobe. 3. No evidence of metastatic disease in the abdomen pelvis. 4. Small nodule along the LEFT oblique fissure has metabolic activity; however,  nodule has been stable in size since 03/16/2021. Favor benign nodule. 5. LEFT hydronephrosis with double-J ureteral stent in place.   04/27/2023 Cancer Staging   Staging form: Lung, AJCC 8th Edition - Pathologic stage from 04/27/2023: pN2, cM0 - Signed by Babara Call, MD on 04/27/2023 Stage prefix: Recurrence   05/16/2023 - 06/27/2023 Chemotherapy   Patient is on Treatment Plan : Carboplatin  + Paclitaxel  Weekly X 6 Weeks with XRT     07/31/2023 Imaging   CT chest wo contrast   1. Interval decrease in size of the precarinal lymph node seen on prior studies. This lymph node is seen to be hypermetabolic on PET-CT 03/30/2023. Close continued attention warranted. 2. Stable 8 mm retro hilar left lung nodule. Additional scattered smaller pulmonary nodules bilaterally are unchanged. 3. Stable focus of architectural distortion in the medial left upper lobe with a 5 mm nodular component. 4. Aortic Atherosclerosis (ICD10-I70.0) and Emphysema (ICD10-J43.9).   08/04/2023 -  Chemotherapy   Patient is on Treatment Plan : LUNG Durvalumab  (10) q14d       INTERVAL HISTORY Heather [name] is a 88 y.o. female who has above history reviewed by me today presents for follow up visit for management of recurrent lung cancer, .  Patient reports feeling well.  She was accompanied by her husband Denies shortness of breath, fatigue, hemoptysis, unintentional weight loss. UTI, treated with a course of Cipro .  Past Medical History:  Diagnosis Date   Anginal pain    Benign essential hypertension    Brain aneurysm    Carotid artery stenosis    Carotid stenosis 06/19/2013   Colonic polyp    Emphysema lung (HCC)    GERD (gastroesophageal reflux disease)    Hiatal hernia    Hypercholesteremia    Hyperlipidemia, mixed 06/09/2015   Lung cancer (HCC) 2016   Lung nodule 06/21/2014   Macrocytic 10/06/2014   Multiple thyroid  nodules    PAT (paroxysmal atrial tachycardia) 06/19/2013   Plantar fasciitis     Past Surgical  History:  Procedure Laterality Date   BREAST EXCISIONAL BIOPSY Right 40 yrs ago   neg   BREAST LUMPECTOMY     benign   CATARACT EXTRACTION W/ INTRAOCULAR LENS  IMPLANT, BILATERAL Bilateral    CEREBRAL ANEURYSM REPAIR  2004   coil treatment   CERVIX LESION DESTRUCTION     COLONOSCOPY     1995, 2006, 2014   COLONOSCOPY WITH PROPOFOL  N/A 03/31/2015   Procedure: COLONOSCOPY WITH PROPOFOL ;  Surgeon: Lamar ONEIDA Holmes, MD;  Location: [facility] ENDOSCOPY;  Service: Endoscopy;  Laterality: N/A;   CYSTOSCOPY W/ RETROGRADES Left 03/02/2022   Procedure: CYSTOSCOPY WITH RETROGRADE PYELOGRAM;  Surgeon: Twylla Glendia JAYSON, MD;  Location: [facility] ORS;  Service: Urology;  Laterality: Left;   CYSTOSCOPY W/ RETROGRADES Left 12/28/2022   Procedure: CYSTOSCOPY WITH RETROGRADE PYELOGRAM;  Surgeon: Twylla Glendia JAYSON, MD;  Location: [facility] ORS;  Service: Urology;  Laterality: Left;   CYSTOSCOPY W/ URETERAL STENT PLACEMENT Left 03/03/2021   Procedure: CYSTOSCOPY WITH EXCHANGE;  Surgeon: Twylla Glendia JAYSON, MD;  Location: [facility] ORS;  Service: Urology;  Laterality: Left;   CYSTOSCOPY W/ URETERAL STENT PLACEMENT Left 03/02/2022   Procedure: CYSTOSCOPY WITH STENT EXCHANGE;  Surgeon: Twylla Glendia JAYSON, MD;  Location: [facility];  Service: Urology;  Laterality: Left;   CYSTOSCOPY W/ URETERAL STENT PLACEMENT Left 12/28/2022   Procedure: CYSTOSCOPY WITH STENT EXCHANGE;  Surgeon: Twylla Glendia JAYSON, MD;  Location: [facility];  Service: Urology;  Laterality: Left;   CYSTOSCOPY WITH STENT PLACEMENT Left 11/25/2020   Procedure: CYSTOSCOPY WITH STENT PLACEMENT;  Surgeon: Twylla Glendia JAYSON, MD;  Location: [facility];  Service: Urology;  Laterality: Left;   ENDOBRONCHIAL ULTRASOUND Bilateral 04/19/2023   Procedure: ENDOBRONCHIAL ULTRASOUND (EBUS);  Surgeon: Isadora Hose, MD;  Location: [facility];  Service: Pulmonary;  Laterality: Bilateral;   FLEXIBLE BRONCHOSCOPY N/A 03/10/2017   Procedure: FLEXIBLE BRONCHOSCOPY;  Surgeon: Volney Lye, MD;   Location: [facility] OR[facility];  Service: Thoracic;  Laterality: N/A;   THORACOSCOPY WITH WEDGE RESECTION LUNG Right 03/13/2014   RUL   THORACOTOMY/LOBECTOMY Left 03/10/2017   Procedure: THORACOTOMY WITH LUNG WEDGE RESECTION POSSIBLE LOBECTOMY;  Surgeon: Volney Lye, MD;  Location: [facility];  Service: Thoracic;  Laterality: Left;   TUBAL LIGATION      Family History  Problem Relation Age of Onset   Alcohol abuse Mother    Heart attack Father    Breast cancer Neg Hx     Social History:  reports that she quit smoking about 35 years ago. Her smoking use included cigarettes. She started smoking about 70 years ago. She has a 35 pack-year smoking history. She has never used smokeless tobacco. She reports current alcohol use. She reports that she does not use drugs.   Allergies:  Allergies  Allergen Reactions   Ace Inhibitors Swelling   Contrast Media [Iodinated Contrast Media]    Cortisone     Patient had facial swelling and itching from cortisone injection IM    Current Medications: Current Outpatient Medications  Medication Sig Dispense Refill   amLODipine  (NORVASC ) 5 MG tablet Take 5 mg by mouth at bedtime.     amoxicillin -clavulanate (AUGMENTIN) 875-125 MG tablet Take by mouth.     atenolol  (TENORMIN ) 50 MG tablet Take 50 mg by mouth at bedtime.     calcium -vitamin D  (OSCAL WITH D) 500-200 MG-UNIT tablet Take 1 tablet by mouth daily with breakfast.     cetirizine (ZYRTEC) 10 MG tablet Take 10 mg by mouth daily.     ciprofloxacin  (CIPRO ) 500 MG tablet Take 1 tablet (500 mg total) by mouth 2 (two) times daily. 14 tablet 0   dextromethorphan-guaiFENesin  (MUCINEX  DM) 30-600 MG 12hr tablet Take 1 tablet by mouth 2 (two) times daily as needed for cough.     fluticasone  (FLONASE ) 50 MCG/ACT nasal spray Place 1 spray into the nose daily as needed for allergies.      nystatin  (MYCOSTATIN ) 100000 UNIT/ML suspension Take 5 mLs (500,000 Units total) by mouth 4 (four) times daily. 473 mL 0   OLANZapine   (ZYPREXA ) 5 MG tablet Take 1 tablet (5 mg total) by mouth at bedtime. For appetite, sleep, and nausea 30 tablet 2   simvastatin  (ZOCOR ) 20 MG tablet Take 20 mg by mouth every morning.     sucralfate  (CARAFATE ) 1 GM/10ML suspension Take 10 mLs (1 g total) by mouth 4 (four) times daily -  with meals and at bedtime. For radiation esophagitis 420 mL 1   trospium  (SANCTURA ) 20 MG tablet Take 1 tablet (20 mg total) by mouth 2 (two) times daily. 30 tablet 1   vitamin B-12 (CYANOCOBALAMIN ) 100 MCG tablet Take 100 mcg by mouth daily.     No current facility-administered medications for this visit.   Review of Systems  Constitutional:  Negative for appetite change, chills, fatigue and fever.  HENT:   Positive for hearing loss. Negative for voice change.   Eyes:  Negative for eye problems.  Respiratory:  Negative for chest tightness and cough.        Congestion  Cardiovascular:  Negative for chest pain.  Gastrointestinal:  Negative for abdominal distention, abdominal pain, blood in stool and nausea.  Endocrine: Negative for hot flashes.  Genitourinary:  Negative for difficulty urinating and frequency.   Musculoskeletal:  Negative for arthralgias.       Chronic intermittent back pain  Skin:  Negative for itching and rash.  Neurological:  Negative for extremity weakness.  Hematological:  Negative for adenopathy.  Psychiatric/Behavioral:  Negative for confusion.     Performance status (ECOG): 0 - Asymptomatic  Vital Signs BP (!) 140/66 (BP Location: Left Arm, Patient Position: Sitting, Cuff Size: Normal) Comment: Rechecked BP  Pulse 75   Temp (!) 96.9 F (36.1 C) (Tympanic)   Resp 18   Wt 127 lb 6.4 oz (57.8 kg)   SpO2 97%   BMI 23.30 kg/m   Physical Exam Constitutional:      General: She is not in acute  distress.    Appearance: She is not diaphoretic.  HENT:     Head: Normocephalic and atraumatic.  Eyes:     General: No scleral icterus. Cardiovascular:     Rate and Rhythm: Normal  rate and regular rhythm.  Pulmonary:     Effort: Pulmonary effort is normal. No respiratory distress.  Abdominal:     General: There is no distension.     Palpations: Abdomen is soft.     Tenderness: There is no abdominal tenderness.  Musculoskeletal:        General: Normal range of motion.     Cervical back: Normal range of motion and neck supple.  Skin:    General: Skin is dry.     Findings: No erythema.  Neurological:     Mental Status: She is alert and oriented to person, place, and time. Mental status is at baseline.  Psychiatric:        Mood and Affect: Mood and affect normal.     RADIOGRAPHIC STUDIES: I have personally reviewed the radiological images as listed and agreed with the findings in the report. Abdomen 1 view (KUB) Result Date: 10/13/2023 EXAM: 1 VIEW XRAY OF THE ABDOMEN 10/12/2023 12:39:00 PM COMPARISON: CT AP 07/11/2023 CLINICAL HISTORY: Kidney stone. Kidney stones. FINDINGS: LINES, TUBES AND DEVICES: Left ureteral stent in place. BOWEL: Nonobstructive bowel gas pattern. SOFT TISSUES: Right upper quadrant calcifications, likely vascular. BONES: Degenerative changes of the lumbar spine. No acute osseous abnormality. IMPRESSION: 1. Left ureteral stent in place. No urinary tract calculi identified. 2. Right upper quadrant calcifications, likely vascular. 3. Degenerative changes of the lumbar spine. Electronically signed by: Waddell Calk MD 10/13/2023 04:10 PM EDT RP Workstation: [id number]    Laboratory results were reviewed by me    Latest Ref Rng & Units 11/03/2023   10:17 AM 10/20/2023   10:37 AM 10/06/2023   12:51 PM  CBC  WBC 4.0 - 10.5 K/uL 5.8  5.6  6.0   Hemoglobin 12.0 - 15.0 g/dL 85.1  84.8  85.8   Hematocrit 36.0 - 46.0 % 44.5  45.1  42.7   Platelets 150 - 400 K/uL 215  180  163       Latest Ref Rng & Units 11/03/2023   10:17 AM 10/20/2023   10:37 AM 10/06/2023   12:51 PM  CMP  Glucose 70 - 99 mg/dL 99  890  85   BUN 8 - 23 mg/dL 19  21  19     Creatinine 0.44 - 1.00 mg/dL 9.07  9.19  9.14   Sodium 135 - 145 mmol/L 136  136  136   Potassium 3.5 - 5.1 mmol/L 4.2  3.9  4.1   Chloride 98 - 111 mmol/L 103  103  104   CO2 22 - 32 mmol/L 24  24  21    Calcium  8.9 - 10.3 mg/dL 9.1  9.3  9.1   Total Protein 6.5 - 8.1 g/dL 6.8  6.9  6.7   Total Bilirubin 0.0 - 1.2 mg/dL 0.7  0.6  0.6   Alkaline Phos 38 - 126 U/L 60  60  58   AST 15 - 41 U/L 23  22  19    ALT 0 - 44 U/L 12  11  12

## 2023-11-04 ENCOUNTER — Other Ambulatory Visit: Payer: Self-pay

## 2023-11-04 ENCOUNTER — Encounter: Payer: Self-pay | Admitting: Oncology

## 2023-11-04 LAB — T4: T4, Total: 4.9 ug/dL (ref 4.5–12.0)

## 2023-11-17 ENCOUNTER — Telehealth: Payer: Self-pay | Admitting: Oncology

## 2023-11-17 ENCOUNTER — Inpatient Hospital Stay

## 2023-11-17 ENCOUNTER — Encounter: Payer: Self-pay | Admitting: Oncology

## 2023-11-17 ENCOUNTER — Inpatient Hospital Stay: Admitting: Oncology

## 2023-11-17 VITALS — BP 198/72 | HR 55 | Temp 97.8°F | Resp 20 | Ht 62.0 in | Wt 128.2 lb

## 2023-11-17 DIAGNOSIS — C3411 Malignant neoplasm of upper lobe, right bronchus or lung: Secondary | ICD-10-CM

## 2023-11-17 DIAGNOSIS — C3432 Malignant neoplasm of lower lobe, left bronchus or lung: Secondary | ICD-10-CM | POA: Diagnosis not present

## 2023-11-17 DIAGNOSIS — I10 Essential (primary) hypertension: Secondary | ICD-10-CM | POA: Diagnosis not present

## 2023-11-17 DIAGNOSIS — C770 Secondary and unspecified malignant neoplasm of lymph nodes of head, face and neck: Secondary | ICD-10-CM | POA: Diagnosis not present

## 2023-11-17 DIAGNOSIS — Z5112 Encounter for antineoplastic immunotherapy: Secondary | ICD-10-CM | POA: Diagnosis not present

## 2023-11-17 LAB — CBC WITH DIFFERENTIAL (CANCER CENTER ONLY)
Abs Immature Granulocytes: 0.1 K/uL — ABNORMAL HIGH (ref 0.00–0.07)
Basophils Absolute: 0 K/uL (ref 0.0–0.1)
Basophils Relative: 0 %
Eosinophils Absolute: 0 K/uL (ref 0.0–0.5)
Eosinophils Relative: 0 %
HCT: 46.2 % — ABNORMAL HIGH (ref 36.0–46.0)
Hemoglobin: 15.4 g/dL — ABNORMAL HIGH (ref 12.0–15.0)
Immature Granulocytes: 1 %
Lymphocytes Relative: 14 %
Lymphs Abs: 1.2 K/uL (ref 0.7–4.0)
MCH: 32.3 pg (ref 26.0–34.0)
MCHC: 33.3 g/dL (ref 30.0–36.0)
MCV: 96.9 fL (ref 80.0–100.0)
Monocytes Absolute: 0.8 K/uL (ref 0.1–1.0)
Monocytes Relative: 9 %
Neutro Abs: 6.2 K/uL (ref 1.7–7.7)
Neutrophils Relative %: 76 %
Platelet Count: 159 K/uL (ref 150–400)
RBC: 4.77 MIL/uL (ref 3.87–5.11)
RDW: 12.2 % (ref 11.5–15.5)
WBC Count: 8.3 K/uL (ref 4.0–10.5)
nRBC: 0 % (ref 0.0–0.2)

## 2023-11-17 LAB — TSH: TSH: 2.78 u[IU]/mL (ref 0.350–4.500)

## 2023-11-17 LAB — CMP (CANCER CENTER ONLY)
ALT: 23 U/L (ref 0–44)
AST: 23 U/L (ref 15–41)
Albumin: 3.7 g/dL (ref 3.5–5.0)
Alkaline Phosphatase: 49 U/L (ref 38–126)
Anion gap: 10 (ref 5–15)
BUN: 24 mg/dL — ABNORMAL HIGH (ref 8–23)
CO2: 26 mmol/L (ref 22–32)
Calcium: 9.2 mg/dL (ref 8.9–10.3)
Chloride: 102 mmol/L (ref 98–111)
Creatinine: 0.91 mg/dL (ref 0.44–1.00)
GFR, Estimated: >60 mL/min (ref >60)
Glucose, Bld: 113 mg/dL — ABNORMAL HIGH (ref 70–99)
Potassium: 4.5 mmol/L (ref 3.5–5.1)
Sodium: 138 mmol/L (ref 135–145)
Total Bilirubin: 0.7 mg/dL (ref 0.0–1.2)
Total Protein: 6.3 g/dL — ABNORMAL LOW (ref 6.5–8.1)

## 2023-11-17 NOTE — Progress Notes (Signed)
 Hematology/Oncology Progress note [phone] [phone]     Chief Complaint:  Follow up for recurrent non small cell lung cancer   ASSESSMENT & PLAN:  Heather [name] is a 88 y.o. Heather [name] female with stage IB adenocarcinoma of the right upper lobe status post wedge resection on 03/13/2014. T2aN1M0 (stage IB).  She is s/p wedge resection of a left lower lobe mass on 03/10/2017. T1aNx (stage IA). No with locally recurrent NSCLC   Malignant neoplasm of lower lobe of left lung (HCC) #History of stage Ib right lung upper lobe adenocarcinoma status post wedge resection in 2016 History of left lower lobe adenocarcinoma T1 a NX status post wedge resection 2019. Recurrence - Biopsy of pretracheal lymph node positive for NSCLC.  MRI brain is negative.  Recommend concurrent chemotherapy [carboplatin  taxol ]  with radiation.  Labs are reviewed and discussed with patient. S/p concurrent carboplatin  AUC2, taxol  45mg /m with Radiation.   Interval CT scan showed partial response Recommend immunotherapy consolidation with Durvalumab  Q2weeks for up to a year.  Labs are reviewed and discussed with patient. Proceed with Durvalumab - BP is high today, she will come back next week for treatment.  Plan to repeat CT chest in October/November    Encounter for antineoplastic immunotherapy Immunotherapy plan as listed above.  Primary cancer of right upper lobe of lung [facility]) Same plan as above.   Uncontrolled hypertension 220/65 with the machine and 190/95 manually. Recommend patient to go to ER for evaluation. Patient declined.  Recommend patient to monitor BP at home and also call her PCP for further management.      Orders Placed This Encounter  Procedures   CBC with Differential (Cancer Center Only)    Standing Status:   Future    Expected Date:   01/26/2024    Expiration Date:   01/25/2025   CMP (Cancer Center only)    Standing Status:   Future    Expected Date:   01/26/2024     Expiration Date:   01/25/2025   Follow up 3 weeks. All questions were answered. The patient knows to call the clinic with any problems, questions or concerns.  We spent sufficient time to discuss many aspect of care, questions were answered to patient's satisfaction.   [name], MD, PhD [facility] Hematology Oncology 11/17/2023   PERTINENT ONCOLOGY HISTORY Previously follows up with Dr.Corcoran. Estasblish care with me on 03/15/2018  Oncology History  Primary cancer of right upper lobe of lung (HCC)  03/13/2014 Initial Diagnosis   Primary cancer of right upper lobe of lung   PET scan on 02/13/2014 revealed a 1 cm right upper lobe hypermetabolic nodule (SUV 3.8) and a 6 mm right upper lobe nodule (no metabolic activity). There was no mediastianl adenopathy.  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 Pathology revealed a 1.1 cm high-grade adenocarcinoma with pleural invasion. Nodes were negative. Pathologic stage was T2aN1M0 (stage IB).     03/30/2023 Imaging   PET  1. Hypermetabolic pretracheal lymph node is concerning for metastatic adenopathy. 2. No evidence of local recurrence in the RIGHT upper lobe. 3. No evidence of metastatic disease in the abdomen pelvis. 4. Small nodule along the LEFT oblique fissure has metabolic activity; however, nodule has been stable in size since 03/16/2021. Favor benign nodule. 5. LEFT hydronephrosis with double-J ureteral stent in place.   05/16/2023 - 06/27/2023 Chemotherapy   Patient is on Treatment Plan : Carboplatin  + Paclitaxel  Weekly X 6 Weeks  with XRT     07/31/2023 Imaging   CT chest wo contrast   1. Interval decrease in size of the precarinal lymph node seen on prior studies. This lymph node is seen to be hypermetabolic on PET-CT 03/30/2023. Close continued attention warranted. 2. Stable 8 mm retro hilar left lung nodule. Additional scattered smaller pulmonary nodules bilaterally are  unchanged. 3. Stable focus of architectural distortion in the medial left upper lobe with a 5 mm nodular component. 4. Aortic Atherosclerosis (ICD10-I70.0) and Emphysema (ICD10-J43.9).   08/04/2023 -  Chemotherapy   Patient is on Treatment Plan : LUNG Durvalumab  (10) q14d     Malignant neoplasm of lower lobe of left lung (HCC)  03/10/2017 Initial Diagnosis   Malignant neoplasm of lower lobe of left lung   Chest CT on 02/10/2017 revealed clear interval progression of the posterior left lower lobe pulmonary nodule, now measuring 12 mm in long axis and having a distinctly lobular contour. Imaging features were very concerning for neoplasm.  There was no change in the 7 mm nodule posterior to the right hilum.   PET scan on 02/18/2017 revealed a 11 x 8 mm posterior left lower lobe pulmonary nodule that had mildly progressed from prior studies and demonstrated mild hypermetabolism (SUV 1.6).  The appearance was considered worrisome for an indolent primary bronchogenic neoplasm.   03/10/2017.  s/p wedge resection of a left lower lobe mass.   Pathology revealed an 8 mm invasive adenocarcinoma with solid and acinar patterns.  There was no visceral invasion.  There was no lymphovascular invasion.  All margins were negative.  Pathologic stage was T1aNx (stage IA).   03/30/2023 Imaging   PET  1. Hypermetabolic pretracheal lymph node is concerning for metastatic adenopathy. 2. No evidence of local recurrence in the RIGHT upper lobe. 3. No evidence of metastatic disease in the abdomen pelvis. 4. Small nodule along the LEFT oblique fissure has metabolic activity; however, nodule has been stable in size since 03/16/2021. Favor benign nodule. 5. LEFT hydronephrosis with double-J ureteral stent in place.   04/27/2023 Cancer Staging   Staging form: Lung, AJCC 8th Edition - Pathologic stage from 04/27/2023: pN2, cM0 - Signed by Babara Call, MD on 04/27/2023 Stage prefix: Recurrence   05/16/2023 - 06/27/2023  Chemotherapy   Patient is on Treatment Plan : Carboplatin  + Paclitaxel  Weekly X 6 Weeks with XRT     07/31/2023 Imaging   CT chest wo contrast   1. Interval decrease in size of the precarinal lymph node seen on prior studies. This lymph node is seen to be hypermetabolic on PET-CT 03/30/2023. Close continued attention warranted. 2. Stable 8 mm retro hilar left lung nodule. Additional scattered smaller pulmonary nodules bilaterally are unchanged. 3. Stable focus of architectural distortion in the medial left upper lobe with a 5 mm nodular component. 4. Aortic Atherosclerosis (ICD10-I70.0) and Emphysema (ICD10-J43.9).   08/04/2023 -  Chemotherapy   Patient is on Treatment Plan : LUNG Durvalumab  (10) q14d       INTERVAL HISTORY Heather [name] is a 88 y.o. female who has above history reviewed by me today presents for follow up visit for management of recurrent lung cancer, .  Patient reports feeling well.  She was accompanied by her husband Denies shortness of breath, fatigue, hemoptysis, unintentional weight loss. Denies headache, nausea vomiting, focal weakness  Past Medical History:  Diagnosis Date   Anginal pain    Benign essential hypertension    Brain aneurysm    Carotid artery stenosis  Carotid stenosis 06/19/2013   Colonic polyp    Emphysema lung (HCC)    GERD (gastroesophageal reflux disease)    Hiatal hernia    Hypercholesteremia    Hyperlipidemia, mixed 06/09/2015   Lung cancer (HCC) 2016   Lung nodule 06/21/2014   Macrocytic 10/06/2014   Multiple thyroid  nodules    PAT (paroxysmal atrial tachycardia) 06/19/2013   Plantar fasciitis     Past Surgical History:  Procedure Laterality Date   BREAST EXCISIONAL BIOPSY Right 40 yrs ago   neg   BREAST LUMPECTOMY     benign   CATARACT EXTRACTION W/ INTRAOCULAR LENS  IMPLANT, BILATERAL Bilateral    CEREBRAL ANEURYSM REPAIR  2004   coil treatment   CERVIX LESION DESTRUCTION     COLONOSCOPY     1995, 2006, 2014    COLONOSCOPY WITH PROPOFOL  N/A 03/31/2015   Procedure: COLONOSCOPY WITH PROPOFOL ;  Surgeon: Lamar ONEIDA Holmes, MD;  Location: [facility] ENDOSCOPY;  Service: Endoscopy;  Laterality: N/A;   CYSTOSCOPY W/ RETROGRADES Left 03/02/2022   Procedure: CYSTOSCOPY WITH RETROGRADE PYELOGRAM;  Surgeon: Twylla Glendia BROCKS, MD;  Location: [facility] ORS;  Service: Urology;  Laterality: Left;   CYSTOSCOPY W/ RETROGRADES Left 12/28/2022   Procedure: CYSTOSCOPY WITH RETROGRADE PYELOGRAM;  Surgeon: Twylla Glendia BROCKS, MD;  Location: [facility] ORS;  Service: Urology;  Laterality: Left;   CYSTOSCOPY W/ URETERAL STENT PLACEMENT Left 03/03/2021   Procedure: CYSTOSCOPY WITH EXCHANGE;  Surgeon: Twylla Glendia BROCKS, MD;  Location: [facility] ORS;  Service: Urology;  Laterality: Left;   CYSTOSCOPY W/ URETERAL STENT PLACEMENT Left 03/02/2022   Procedure: CYSTOSCOPY WITH STENT EXCHANGE;  Surgeon: Twylla Glendia BROCKS, MD;  Location: [facility] ORS;  Service: Urology;  Laterality: Left;   CYSTOSCOPY W/ URETERAL STENT PLACEMENT Left 12/28/2022   Procedure: CYSTOSCOPY WITH STENT EXCHANGE;  Surgeon: Twylla Glendia BROCKS, MD;  Location: [facility] ORS;  Service: Urology;  Laterality: Left;   CYSTOSCOPY WITH STENT PLACEMENT Left 11/25/2020   Procedure: CYSTOSCOPY WITH STENT PLACEMENT;  Surgeon: Twylla Glendia BROCKS, MD;  Location: [facility] ORS;  Service: Urology;  Laterality: Left;   ENDOBRONCHIAL ULTRASOUND Bilateral 04/19/2023   Procedure: ENDOBRONCHIAL ULTRASOUND (EBUS);  Surgeon: Isadora Hose, MD;  Location: [facility] ORS;  Service: Pulmonary;  Laterality: Bilateral;   FLEXIBLE BRONCHOSCOPY N/A 03/10/2017   Procedure: FLEXIBLE BRONCHOSCOPY;  Surgeon: Volney Lye, MD;  Location: [facility] ORS;  Service: Thoracic;  Laterality: N/A;   THORACOSCOPY WITH WEDGE RESECTION LUNG Right 03/13/2014   RUL   THORACOTOMY/LOBECTOMY Left 03/10/2017   Procedure: THORACOTOMY WITH LUNG WEDGE RESECTION POSSIBLE LOBECTOMY;  Surgeon: Volney Lye, MD;  Location: [facility] ORS;  Service: Thoracic;  Laterality: Left;    TUBAL LIGATION      Family History  Problem Relation Age of Onset   Alcohol abuse Mother    Heart attack Father    Breast cancer Neg Hx     Social History:  reports that she quit smoking about 35 years ago. Her smoking use included cigarettes. She started smoking about 70 years ago. She has a 35 pack-year smoking history. She has never used smokeless tobacco. She reports current alcohol use. She reports that she does not use drugs.   Allergies:  Allergies  Allergen Reactions   Ace Inhibitors Swelling   Contrast Media [Iodinated Contrast Media]    Cortisone     Patient had facial swelling and itching from cortisone injection IM    Current Medications: Current Outpatient Medications  Medication Sig Dispense Refill   amLODipine  (NORVASC ) 5 MG tablet Take 5  mg by mouth at bedtime.     atenolol  (TENORMIN ) 50 MG tablet Take 50 mg by mouth at bedtime.     calcium -vitamin D  (OSCAL WITH D) 500-200 MG-UNIT tablet Take 1 tablet by mouth daily with breakfast.     cetirizine (ZYRTEC) 10 MG tablet Take 10 mg by mouth daily.     ciprofloxacin  (CIPRO ) 500 MG tablet Take 1 tablet (500 mg total) by mouth 2 (two) times daily. 14 tablet 0   dextromethorphan-guaiFENesin  (MUCINEX  DM) 30-600 MG 12hr tablet Take 1 tablet by mouth 2 (two) times daily as needed for cough.     fluticasone  (FLONASE ) 50 MCG/ACT nasal spray Place 1 spray into the nose daily as needed for allergies.      nystatin  (MYCOSTATIN ) 100000 UNIT/ML suspension Take 5 mLs (500,000 Units total) by mouth 4 (four) times daily. 473 mL 0   simvastatin  (ZOCOR ) 20 MG tablet Take 20 mg by mouth every morning.     trospium  (SANCTURA ) 20 MG tablet Take 1 tablet (20 mg total) by mouth 2 (two) times daily. 30 tablet 1   vitamin B-12 (CYANOCOBALAMIN ) 100 MCG tablet Take 100 mcg by mouth daily.     amoxicillin -clavulanate (AUGMENTIN) 875-125 MG tablet Take by mouth. (Patient not taking: Reported on 11/17/2023)     donepezil  (ARICEPT ) 10 MG tablet  Take 10 mg by mouth at bedtime. (Patient not taking: Reported on 11/17/2023)     OLANZapine  (ZYPREXA ) 5 MG tablet Take 1 tablet (5 mg total) by mouth at bedtime. For appetite, sleep, and nausea (Patient not taking: Reported on 11/17/2023) 30 tablet 2   sucralfate  (CARAFATE ) 1 GM/10ML suspension Take 10 mLs (1 g total) by mouth 4 (four) times daily -  with meals and at bedtime. For radiation esophagitis (Patient not taking: Reported on 11/17/2023) 420 mL 1   No current facility-administered medications for this visit.   Review of Systems  Constitutional:  Negative for appetite change, chills, fatigue and fever.  HENT:   Positive for hearing loss. Negative for voice change.   Eyes:  Negative for eye problems.  Respiratory:  Negative for chest tightness and cough.   Cardiovascular:  Negative for chest pain.  Gastrointestinal:  Negative for abdominal distention, abdominal pain, blood in stool and nausea.  Endocrine: Negative for hot flashes.  Genitourinary:  Negative for difficulty urinating and frequency.   Musculoskeletal:  Negative for arthralgias.       Chronic intermittent back pain  Skin:  Negative for itching and rash.  Neurological:  Negative for extremity weakness.  Hematological:  Negative for adenopathy.  Psychiatric/Behavioral:  Negative for confusion.     Performance status (ECOG): 0 - Asymptomatic  Vital Signs BP (!) 198/72 (BP Location: Right Arm, Patient Position: Sitting)   Pulse (!) 55   Temp 97.8 F (36.6 C) (Tympanic)   Resp 20   Ht 5' 2 (1.575 m)   Wt 128 lb 3.2 oz (58.2 kg)   SpO2 97%   BMI 23.45 kg/m   Physical Exam Constitutional:      General: She is not in acute distress.    Appearance: She is not diaphoretic.  HENT:     Head: Normocephalic and atraumatic.  Eyes:     General: No scleral icterus. Cardiovascular:     Rate and Rhythm: Normal rate and regular rhythm.  Pulmonary:     Effort: Pulmonary effort is normal. No respiratory distress.   Abdominal:     General: There is no distension.  Palpations: Abdomen is soft.     Tenderness: There is no abdominal tenderness.  Musculoskeletal:        General: Normal range of motion.     Cervical back: Normal range of motion and neck supple.  Skin:    General: Skin is dry.     Findings: No erythema.  Neurological:     Mental Status: She is alert and oriented to person, place, and time. Mental status is at baseline.  Psychiatric:        Mood and Affect: Mood and affect normal.     RADIOGRAPHIC STUDIES: I have personally reviewed the radiological images as listed and agreed with the findings in the report. Abdomen 1 view (KUB) Result Date: 10/13/2023 EXAM: 1 VIEW XRAY OF THE ABDOMEN 10/12/2023 12:39:00 PM COMPARISON: CT AP 07/11/2023 CLINICAL HISTORY: Kidney stone. Kidney stones. FINDINGS: LINES, TUBES AND DEVICES: Left ureteral stent in place. BOWEL: Nonobstructive bowel gas pattern. SOFT TISSUES: Right upper quadrant calcifications, likely vascular. BONES: Degenerative changes of the lumbar spine. No acute osseous abnormality. IMPRESSION: 1. Left ureteral stent in place. No urinary tract calculi identified. 2. Right upper quadrant calcifications, likely vascular. 3. Degenerative changes of the lumbar spine. Electronically signed by: Waddell Calk MD 10/13/2023 04:10 PM EDT RP Workstation: [id number]    Laboratory results were reviewed by me    Latest Ref Rng & Units 11/17/2023   10:17 AM 11/03/2023   10:17 AM 10/20/2023   10:37 AM  CBC  WBC 4.0 - 10.5 K/uL 8.3  5.8  5.6   Hemoglobin 12.0 - 15.0 g/dL 84.5  85.1  84.8   Hematocrit 36.0 - 46.0 % 46.2  44.5  45.1   Platelets 150 - 400 K/uL 159  215  180       Latest Ref Rng & Units 11/17/2023   10:17 AM 11/03/2023   10:17 AM 10/20/2023   10:37 AM  CMP  Glucose 70 - 99 mg/dL 886  99  890   BUN 8 - 23 mg/dL 24  19  21    Creatinine 0.44 - 1.00 mg/dL 9.08  9.07  9.19   Sodium 135 - 145 mmol/L 138  136  136   Potassium 3.5 -  5.1 mmol/L 4.5  4.2  3.9   Chloride 98 - 111 mmol/L 102  103  103   CO2 22 - 32 mmol/L 26  24  24    Calcium  8.9 - 10.3 mg/dL 9.2  9.1  9.3   Total Protein 6.5 - 8.1 g/dL 6.3  6.8  6.9   Total Bilirubin 0.0 - 1.2 mg/dL 0.7  0.7  0.6   Alkaline Phos 38 - 126 U/L 49  60  60   AST 15 - 41 U/L 23  23  22    ALT 0 - 44 U/L 23  12  11

## 2023-11-17 NOTE — Progress Notes (Signed)
 Patient here for imfinzi  infusion. Pt had bp of 198/72 with doctor. BP was rechecked in infusion room and was found to be 190/95 manually. MD notified of pts hypertension. Pt stated she had taken her bp meds as prescribed and had no symptoms of hypertension. MD recommended pt go to the emergency room for her high blood pressure. MD also ordered to hold infusion today until next week. Pt verified understanding of needing to reschedule this infusion, but declined to go to the ER because she feels fine. Babara, MD notified pt declines to go to the ER at this time, so she instructed the pt to contact their PCP to notify them of her high bps today. Pt educated to call her PCP as soon as possible and to also get a home bp machine to monitor at home. Pt also educated to go the ER if she develops any symptoms of high bp including but not limited to blurry vision or a headache. Pt verified understanding and rescheduled appointment for next week.

## 2023-11-17 NOTE — Assessment & Plan Note (Signed)
 Immunotherapy plan as listed above

## 2023-11-17 NOTE — Telephone Encounter (Signed)
 Called pt to confirm CT for 10/30 - pt confirmed appt date/time is still good - confirmed appt in system - [facility]

## 2023-11-17 NOTE — Assessment & Plan Note (Addendum)
 220/65 with the machine and 190/95 manually. Recommend patient to go to ER for evaluation. Patient declined.  Recommend patient to monitor BP at home and also call her PCP for further management.

## 2023-11-17 NOTE — Assessment & Plan Note (Addendum)
#  History of stage Ib right lung upper lobe adenocarcinoma status post wedge resection in 2016 History of left lower lobe adenocarcinoma T1 a NX status post wedge resection 2019. Recurrence - Biopsy of pretracheal lymph node positive for NSCLC.  MRI brain is negative.  Recommend concurrent chemotherapy [carboplatin  taxol ]  with radiation.  Labs are reviewed and discussed with patient. S/p concurrent carboplatin  AUC2, taxol  45mg /m with Radiation.   Interval CT scan showed partial response Recommend immunotherapy consolidation with Durvalumab  Q2weeks for up to a year.  Labs are reviewed and discussed with patient. Proceed with Durvalumab - BP is high today, she will come back next week for treatment.  Plan to repeat CT chest in October/November

## 2023-11-17 NOTE — Assessment & Plan Note (Signed)
Same plan as above.  

## 2023-11-18 ENCOUNTER — Other Ambulatory Visit: Payer: Self-pay

## 2023-11-23 ENCOUNTER — Inpatient Hospital Stay

## 2023-11-23 VITALS — BP 136/57 | HR 58 | Temp 97.2°F | Resp 18

## 2023-11-23 DIAGNOSIS — C3432 Malignant neoplasm of lower lobe, left bronchus or lung: Secondary | ICD-10-CM

## 2023-11-23 DIAGNOSIS — C3411 Malignant neoplasm of upper lobe, right bronchus or lung: Secondary | ICD-10-CM

## 2023-11-23 DIAGNOSIS — C770 Secondary and unspecified malignant neoplasm of lymph nodes of head, face and neck: Secondary | ICD-10-CM | POA: Diagnosis not present

## 2023-11-23 MED ORDER — SODIUM CHLORIDE 0.9 % IV SOLN
INTRAVENOUS | Status: DC
  Filled 2023-11-23: qty 250

## 2023-11-23 MED ORDER — SODIUM CHLORIDE 0.9 % IV SOLN
10.0000 mg/kg | Freq: Once | INTRAVENOUS | Status: AC
  Administered 2023-11-23: 500 mg via INTRAVENOUS
  Filled 2023-11-23: qty 10

## 2023-11-23 NOTE — Progress Notes (Signed)
 Keep imfinzi  dose at 500mg  Per Dr Babara

## 2023-11-24 ENCOUNTER — Telehealth: Payer: Self-pay | Admitting: Oncology

## 2023-11-24 ENCOUNTER — Inpatient Hospital Stay

## 2023-11-24 ENCOUNTER — Ambulatory Visit
Admission: RE | Admit: 2023-11-24 | Discharge: 2023-11-24 | Disposition: A | Discharge status: Home / Self Care | Source: Ambulatory Visit | Attending: Oncology | Admitting: Oncology

## 2023-11-24 DIAGNOSIS — C3432 Malignant neoplasm of lower lobe, left bronchus or lung: Secondary | ICD-10-CM | POA: Diagnosis present

## 2023-11-24 NOTE — Telephone Encounter (Signed)
 Patient's spouse called in to request a date change for her chemo on 12/4. She will be having a procedure at [facility] that day and will not be able to come in.   Please advise.   Thanks!

## 2023-11-24 NOTE — Telephone Encounter (Signed)
 Thank you. Will have Dr. Babara discuss with pt at upcoming appt on 11/13.

## 2023-11-28 ENCOUNTER — Other Ambulatory Visit: Payer: Self-pay

## 2023-12-01 ENCOUNTER — Ambulatory Visit

## 2023-12-01 ENCOUNTER — Ambulatory Visit: Admitting: Oncology

## 2023-12-01 ENCOUNTER — Other Ambulatory Visit

## 2023-12-02 ENCOUNTER — Other Ambulatory Visit: Payer: Self-pay

## 2023-12-05 NOTE — Progress Notes (Signed)
 Chief Complaint  Patient presents with  . itching    All over - began Saturday    HPI  Heather [name] is a 88 year old female here for itching that is generalized.  States she did break out into some hives on Saturday across the torso.  States she has had this once before and related to anxiety and stress.  She has been under a little bit of stress.  Did start amlodipine  back in at the end of October but has been doing well until this weekend.  Does not recall any new foods.  No new shampoos, soaps, detergents, or clothing.  Denies any tick bites this summer.  Has not tried any specific treatments.  Hives resolved on their own on Saturday.   ROS  Review of systems is unremarkable for any active cardiac, respiratory, GI, GU, hematologic, neurologic, dermatologic, HEENT, or psychiatric symptoms except as noted above.  No fevers, chills, or constitutional symptoms.   Current Outpatient Medications  Medication Sig Dispense Refill  . amLODIPine  (NORVASC ) 5 MG tablet Take 1 tablet (5 mg total) by mouth at bedtime 30 tablet 11  . atenoloL  (TENORMIN ) 50 MG tablet Take 1 tablet (50 mg total) by mouth once daily 90 tablet 3  . cetirizine (ZYRTEC) 10 MG tablet Take 10 mg by mouth every morning    . chlorthalidone 25 MG tablet Take 1 tablet (25 mg total) by mouth once daily as needed (edema) 30 tablet 11  . cholecalciferol  (VITAMIN D3) 1000 unit tablet Take 1 tablet (1,000 Units total) by mouth once daily    . cyanocobalamin  (VITAMIN B12) 100 MCG tablet Take 1 tablet (100 mcg total) by mouth once daily    . donepeziL  (ARICEPT ) 10 MG tablet Take 1 tablet (10 mg total) by mouth at bedtime 90 tablet 3  . fluticasone  (FLONASE ) 50 mcg/actuation nasal spray Place 1 spray into both nostrils 2 (two) times daily. (Patient taking differently: Place 2 sprays into both nostrils once daily as needed) 16 g 0  . simvastatin  (ZOCOR ) 20 MG tablet Take 1 tablet (20 mg total) by mouth at bedtime 90 tablet 3  .  hydrOXYzine HCL (ATARAX) 10 MG tablet Take 1 tablet (10 mg total) by mouth 3 (three) times daily as needed for Itching for up to 10 days 30 tablet 0   No current facility-administered medications for this visit.    Allergies as of 12/05/2023 - Reviewed 12/05/2023  Allergen Reaction Noted  . Iodinated contrast media Anaphylaxis 04/19/2013  . Ace inhibitors Angioedema 08/22/2017  . Arb-angiotensin receptor antagonist Angioedema 12/27/2017  . Metrizamide Unknown 06/11/2014    Patient Active Problem List  Diagnosis  . GERD (gastroesophageal reflux disease)  . Brain aneurysm (HHS-HCC)  . PAT (paroxysmal atrial tachycardia) (HHS-HCC)  . Malignant neoplasm of upper lobe of right lung (CMS/HHS-HCC)  . Hyperlipidemia, mixed  . DDD (degenerative disc disease), lumbar  . Medicare annual wellness visit, initial  . Malignant neoplasm of lower lobe of left lung (CMS/HHS-HCC)  . Benign essential hypertension  . Panlobular emphysema (CMS/HHS-HCC)  . Acquired ureteropelvic junction (UPJ) stricture  . UPJ (ureteropelvic junction) obstruction  . Mild vascular dementia (CMS-HCC)    Past Medical History:  Diagnosis Date  . Allergic state   . Brain aneurysm (HHS-HCC)   . Carotid stenosis 06/19/2013  . Colon polyps   . GERD (gastroesophageal reflux disease)   . Hyperlipidemia   . Hypertension   . Lung cancer (CMS/HHS-HCC)    Adnenocarcinoma 1.1 cm, s/p RUL  wedge resection 02/2014  . Multiple thyroid  nodules    Last thyroid  ultrasound 08/2014 (Dr Damian)  . Other nonspecific abnormal finding   . PAT (paroxysmal atrial tachycardia) (HHS-HCC) 06/19/2013  . Plantar fascial fibromatosis     Past Surgical History:  Procedure Laterality Date  . COLONOSCOPY  10/21/2004   12/15/1993; Hyperplastic Polyps  . COLONOSCOPY  09/18/2012   Adenomatous Polyps: CBF 08/2015; OV 02/27/2015 @ 9am w/Kim Moishe NP (dw)  . THORACOSCOPY WITH WEDGE RESECTION LUNG Right 02/2014  . COLONOSCOPY  03/31/2015   Adenomatous  Polyp: No repeat due to age per RTE (dw)  . Coil treatment     for brain aneurysm  . Cryosurgery     for abnormal Pap smear  . kidney stent    . MASTECTOMY PARTIAL / LUMPECTOMY Right    BENIGN  . TUBAL LIGATION      Vitals:   12/05/23 1147  BP: (!) 158/72  Pulse: 63  SpO2: 95%  Weight: 57.6 kg (127 lb)  Height: 151.1 cm (4' 11.49)  PainSc: 0-No pain   Body mass index is 25.23 kg/m.  Exam BP (!) 158/72 (BP Location: Left upper arm, Patient Position: Sitting, BP Cuff Size: Adult)   Pulse 63   Ht 151.1 cm (4' 11.49)   Wt 57.6 kg (127 lb)   SpO2 95%   BMI 25.23 kg/m   General. Well appearing; NAD; VS reviewed     Eyes. Sclera and conjunctiva clear; Vision grossly intact; extraocular movements intact Oropharynx. No suspicious lesions Neck. Supple. No swelling, masses, thyroid  normal size, no masses palpated.   Lungs. Respirations unlabored; clear to auscultation bilaterally Cardiovascular. Heart regular rate and rhythm without murmurs, gallops, or rubs Skin. Normal color and turgor; no rash today.  No jaundice.   Assessment & Plan   Itching Acute; mild itching.  Some evidence of dry skin.  Advised her apply lotion.  Can do Zyrtec 10 mg once daily to see if this will help prevent itching.  Have given her hydroxyzine 10 mg to take as needed for severe itching or hive development.  CMP stable on 11/17/2023.  No evidence of jaundice on exam today. -     hydrOXYzine HCL (ATARAX) 10 MG tablet; Take 1 tablet (10 mg total) by mouth 3 (three) times daily as needed for Itching for up to 10 days  Urticaria Cute; resolved spontaneously over the weekend.  Will continue to monitor for reoccurrence.   F/U: Patient to follow-up as needed.  JASON HESTLE WHITAKER, PA  This note has been created using automated tools and reviewed for accuracy by JASON HESTLE WHITAKER.   Note: This dictation was prepared with Dragon dictation along with smaller phrase technology. Any  transcriptional errors that result from this process are unintentional.

## 2023-12-08 ENCOUNTER — Inpatient Hospital Stay

## 2023-12-08 ENCOUNTER — Encounter: Payer: Self-pay | Admitting: Oncology

## 2023-12-08 ENCOUNTER — Inpatient Hospital Stay: Attending: Oncology | Admitting: Oncology

## 2023-12-08 VITALS — BP 134/60 | HR 58 | Temp 97.2°F | Resp 18 | Wt 127.0 lb

## 2023-12-08 VITALS — BP 146/81 | HR 57

## 2023-12-08 DIAGNOSIS — I2510 Atherosclerotic heart disease of native coronary artery without angina pectoris: Secondary | ICD-10-CM | POA: Insufficient documentation

## 2023-12-08 DIAGNOSIS — I700 Atherosclerosis of aorta: Secondary | ICD-10-CM | POA: Insufficient documentation

## 2023-12-08 DIAGNOSIS — I10 Essential (primary) hypertension: Secondary | ICD-10-CM | POA: Insufficient documentation

## 2023-12-08 DIAGNOSIS — C3411 Malignant neoplasm of upper lobe, right bronchus or lung: Secondary | ICD-10-CM

## 2023-12-08 DIAGNOSIS — Z5112 Encounter for antineoplastic immunotherapy: Secondary | ICD-10-CM | POA: Insufficient documentation

## 2023-12-08 DIAGNOSIS — Z79899 Other long term (current) drug therapy: Secondary | ICD-10-CM | POA: Insufficient documentation

## 2023-12-08 DIAGNOSIS — C3432 Malignant neoplasm of lower lobe, left bronchus or lung: Secondary | ICD-10-CM | POA: Diagnosis not present

## 2023-12-08 DIAGNOSIS — K449 Diaphragmatic hernia without obstruction or gangrene: Secondary | ICD-10-CM | POA: Insufficient documentation

## 2023-12-08 DIAGNOSIS — Z87891 Personal history of nicotine dependence: Secondary | ICD-10-CM | POA: Insufficient documentation

## 2023-12-08 DIAGNOSIS — M47814 Spondylosis without myelopathy or radiculopathy, thoracic region: Secondary | ICD-10-CM | POA: Diagnosis not present

## 2023-12-08 DIAGNOSIS — N1330 Unspecified hydronephrosis: Secondary | ICD-10-CM | POA: Insufficient documentation

## 2023-12-08 DIAGNOSIS — C770 Secondary and unspecified malignant neoplasm of lymph nodes of head, face and neck: Secondary | ICD-10-CM | POA: Insufficient documentation

## 2023-12-08 DIAGNOSIS — J432 Centrilobular emphysema: Secondary | ICD-10-CM | POA: Diagnosis not present

## 2023-12-08 DIAGNOSIS — E782 Mixed hyperlipidemia: Secondary | ICD-10-CM | POA: Diagnosis not present

## 2023-12-08 DIAGNOSIS — Z9221 Personal history of antineoplastic chemotherapy: Secondary | ICD-10-CM | POA: Diagnosis not present

## 2023-12-08 LAB — CBC WITH DIFFERENTIAL (CANCER CENTER ONLY)
Abs Immature Granulocytes: 0.04 K/uL (ref 0.00–0.07)
Basophils Absolute: 0 K/uL (ref 0.0–0.1)
Basophils Relative: 0 %
Eosinophils Absolute: 0 K/uL (ref 0.0–0.5)
Eosinophils Relative: 0 %
HCT: 45.4 % (ref 36.0–46.0)
Hemoglobin: 15 g/dL (ref 12.0–15.0)
Immature Granulocytes: 1 %
Lymphocytes Relative: 17 %
Lymphs Abs: 1.4 K/uL (ref 0.7–4.0)
MCH: 32.5 pg (ref 26.0–34.0)
MCHC: 33 g/dL (ref 30.0–36.0)
MCV: 98.5 fL (ref 80.0–100.0)
Monocytes Absolute: 0.7 K/uL (ref 0.1–1.0)
Monocytes Relative: 8 %
Neutro Abs: 5.7 K/uL (ref 1.7–7.7)
Neutrophils Relative %: 74 %
Platelet Count: 162 K/uL (ref 150–400)
RBC: 4.61 MIL/uL (ref 3.87–5.11)
RDW: 12.7 % (ref 11.5–15.5)
WBC Count: 7.8 K/uL (ref 4.0–10.5)
nRBC: 0 % (ref 0.0–0.2)

## 2023-12-08 LAB — CMP (CANCER CENTER ONLY)
ALT: 23 U/L (ref 0–44)
AST: 19 U/L (ref 15–41)
Albumin: 3.7 g/dL (ref 3.5–5.0)
Alkaline Phosphatase: 52 U/L (ref 38–126)
Anion gap: 9 (ref 5–15)
BUN: 23 mg/dL (ref 8–23)
CO2: 27 mmol/L (ref 22–32)
Calcium: 9.2 mg/dL (ref 8.9–10.3)
Chloride: 104 mmol/L (ref 98–111)
Creatinine: 0.8 mg/dL (ref 0.44–1.00)
GFR, Estimated: >60 mL/min (ref >60)
Glucose, Bld: 105 mg/dL — ABNORMAL HIGH (ref 70–99)
Potassium: 4.4 mmol/L (ref 3.5–5.1)
Sodium: 140 mmol/L (ref 135–145)
Total Bilirubin: 0.6 mg/dL (ref 0.0–1.2)
Total Protein: 6.6 g/dL (ref 6.5–8.1)

## 2023-12-08 LAB — TSH: TSH: 3.84 u[IU]/mL (ref 0.350–4.500)

## 2023-12-08 MED ORDER — SODIUM CHLORIDE 0.9 % IV SOLN
INTRAVENOUS | Status: DC
  Filled 2023-12-08: qty 250

## 2023-12-08 MED ORDER — SODIUM CHLORIDE 0.9 % IV SOLN
10.0000 mg/kg | Freq: Once | INTRAVENOUS | Status: AC
  Administered 2023-12-08: 500 mg via INTRAVENOUS
  Filled 2023-12-08: qty 10

## 2023-12-08 NOTE — Assessment & Plan Note (Signed)
 Immunotherapy plan as listed above

## 2023-12-08 NOTE — Progress Notes (Signed)
 Hematology/Oncology Progress note [phone] [phone]) [phone]     Chief Complaint:  Follow up for recurrent non small cell lung cancer   ASSESSMENT & PLAN:  Heather [name] is a 88 y.o. [name] female with stage IB adenocarcinoma of the right upper lobe status post wedge resection on 03/13/2014. T2aN1M0 (stage IB).  She is s/p wedge resection of a left lower lobe mass on 03/10/2017. T1aNx (stage IA). No with locally recurrent NSCLC   Malignant neoplasm of lower lobe of left lung (HCC) #History of stage Ib right lung upper lobe adenocarcinoma status post wedge resection in 2016 History of left lower lobe adenocarcinoma T1 a NX status post wedge resection 2019. Recurrence - Biopsy of pretracheal lymph node positive for NSCLC.  MRI brain is negative.  Recommend concurrent chemotherapy [carboplatin  taxol ]  with radiation.  Labs are reviewed and discussed with patient. S/p concurrent carboplatin  AUC2, taxol  45mg /m with Radiation.    Recommend immunotherapy consolidation with Durvalumab  Q2weeks for up to a year.  Labs are reviewed and discussed with patient. Oct 2025 CT showed treatment response.  Proceed with Durvalumab    Encounter for antineoplastic immunotherapy Immunotherapy plan as listed above.  Primary cancer of right upper lobe of lung [facility]) Same plan as above.    No orders of the defined types were placed in this encounter.  Follow up per LOS. All questions were answered. The patient knows to call the clinic with any problems, questions or concerns.  We spent sufficient time to discuss many aspect of care, questions were answered to patient's satisfaction.   [name], MD, PhD [facility] Hematology Oncology 12/08/2023   PERTINENT ONCOLOGY HISTORY Previously follows up with Dr.Corcoran. Estasblish care with me on 03/15/2018  Oncology History  Primary cancer of right upper lobe of lung (HCC)  03/13/2014 Initial Diagnosis   Primary cancer of right upper lobe of  lung   PET scan on 02/13/2014 revealed a 1 cm right upper lobe hypermetabolic nodule (SUV 3.8) and a 6 mm right upper lobe nodule (no metabolic activity). There was no mediastianl adenopathy.  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 Pathology revealed a 1.1 cm high-grade adenocarcinoma with pleural invasion. Nodes were negative. Pathologic stage was T2aN1M0 (stage IB).     03/30/2023 Imaging   PET  1. Hypermetabolic pretracheal lymph node is concerning for metastatic adenopathy. 2. No evidence of local recurrence in the RIGHT upper lobe. 3. No evidence of metastatic disease in the abdomen pelvis. 4. Small nodule along the LEFT oblique fissure has metabolic activity; however, nodule has been stable in size since 03/16/2021. Favor benign nodule. 5. LEFT hydronephrosis with double-J ureteral stent in place.   05/16/2023 - 06/27/2023 Chemotherapy   Patient is on Treatment Plan : Carboplatin  + Paclitaxel  Weekly X 6 Weeks with XRT     07/31/2023 Imaging   CT chest wo contrast   1. Interval decrease in size of the precarinal lymph node seen on prior studies. This lymph node is seen to be hypermetabolic on PET-CT 03/30/2023. Close continued attention warranted. 2. Stable 8 mm retro hilar left lung nodule. Additional scattered smaller pulmonary nodules bilaterally are unchanged. 3. Stable focus of architectural distortion in the medial left upper lobe with a 5 mm nodular component. 4. Aortic Atherosclerosis (ICD10-I70.0) and Emphysema (ICD10-J43.9).   08/04/2023 -  Chemotherapy   Patient is on Treatment Plan : LUNG Durvalumab  (10) q14d     Malignant neoplasm of lower lobe of left  lung (HCC)  03/10/2017 Initial Diagnosis   Malignant neoplasm of lower lobe of left lung   Chest CT on 02/10/2017 revealed clear interval progression of the posterior left lower lobe pulmonary nodule, now measuring 12 mm in long axis and having a distinctly lobular  contour. Imaging features were very concerning for neoplasm.  There was no change in the 7 mm nodule posterior to the right hilum.   PET scan on 02/18/2017 revealed a 11 x 8 mm posterior left lower lobe pulmonary nodule that had mildly progressed from prior studies and demonstrated mild hypermetabolism (SUV 1.6).  The appearance was considered worrisome for an indolent primary bronchogenic neoplasm.   03/10/2017.  s/p wedge resection of a left lower lobe mass.   Pathology revealed an 8 mm invasive adenocarcinoma with solid and acinar patterns.  There was no visceral invasion.  There was no lymphovascular invasion.  All margins were negative.  Pathologic stage was T1aNx (stage IA).   03/30/2023 Imaging   PET  1. Hypermetabolic pretracheal lymph node is concerning for metastatic adenopathy. 2. No evidence of local recurrence in the RIGHT upper lobe. 3. No evidence of metastatic disease in the abdomen pelvis. 4. Small nodule along the LEFT oblique fissure has metabolic activity; however, nodule has been stable in size since 03/16/2021. Favor benign nodule. 5. LEFT hydronephrosis with double-J ureteral stent in place.   04/27/2023 Cancer Staging   Staging form: Lung, AJCC 8th Edition - Pathologic stage from 04/27/2023: pN2, cM0 - Signed by Babara Call, MD on 04/27/2023 Stage prefix: Recurrence   05/16/2023 - 06/27/2023 Chemotherapy   Patient is on Treatment Plan : Carboplatin  + Paclitaxel  Weekly X 6 Weeks with XRT     07/31/2023 Imaging   CT chest wo contrast   1. Interval decrease in size of the precarinal lymph node seen on prior studies. This lymph node is seen to be hypermetabolic on PET-CT 03/30/2023. Close continued attention warranted. 2. Stable 8 mm retro hilar left lung nodule. Additional scattered smaller pulmonary nodules bilaterally are unchanged. 3. Stable focus of architectural distortion in the medial left upper lobe with a 5 mm nodular component. 4. Aortic Atherosclerosis (ICD10-I70.0)  and Emphysema (ICD10-J43.9).   08/04/2023 -  Chemotherapy   Patient is on Treatment Plan : LUNG Durvalumab  (10) q14d       INTERVAL HISTORY Heather [name] is a 88 y.o. female who has above history reviewed by me today presents for follow up visit for management of recurrent lung cancer, .  Patient reports feeling well.  She was accompanied by her husband Denies shortness of breath, fatigue, hemoptysis, unintentional weight loss. Denies headache, nausea vomiting, focal weakness  Past Medical History:  Diagnosis Date   Anginal pain    Benign essential hypertension    Brain aneurysm    Carotid artery stenosis    Carotid stenosis 06/19/2013   Colonic polyp    Emphysema lung (HCC)    GERD (gastroesophageal reflux disease)    Hiatal hernia    Hypercholesteremia    Hyperlipidemia, mixed 06/09/2015   Lung cancer (HCC) 2016   Lung nodule 06/21/2014   Macrocytic 10/06/2014   Multiple thyroid  nodules    PAT (paroxysmal atrial tachycardia) 06/19/2013   Plantar fasciitis     Past Surgical History:  Procedure Laterality Date   BREAST EXCISIONAL BIOPSY Right 40 yrs ago   neg   BREAST LUMPECTOMY     benign   CATARACT EXTRACTION W/ INTRAOCULAR LENS  IMPLANT, BILATERAL Bilateral  CEREBRAL ANEURYSM REPAIR  2004   coil treatment   CERVIX LESION DESTRUCTION     COLONOSCOPY     1995, 2006, 2014   COLONOSCOPY WITH PROPOFOL  N/A 03/31/2015   Procedure: COLONOSCOPY WITH PROPOFOL ;  Surgeon: Lamar ONEIDA Holmes, MD;  Location: [facility] ENDOSCOPY;  Service: Endoscopy;  Laterality: N/A;   CYSTOSCOPY W/ RETROGRADES Left 03/02/2022   Procedure: CYSTOSCOPY WITH RETROGRADE PYELOGRAM;  Surgeon: Twylla Glendia BROCKS, MD;  Location: [facility] ORS;  Service: Urology;  Laterality: Left;   CYSTOSCOPY W/ RETROGRADES Left 12/28/2022   Procedure: CYSTOSCOPY WITH RETROGRADE PYELOGRAM;  Surgeon: Twylla Glendia BROCKS, MD;  Location: [facility] ORS;  Service: Urology;  Laterality: Left;   CYSTOSCOPY W/ URETERAL STENT PLACEMENT Left  03/03/2021   Procedure: CYSTOSCOPY WITH EXCHANGE;  Surgeon: Twylla Glendia BROCKS, MD;  Location: [facility] ORS;  Service: Urology;  Laterality: Left;   CYSTOSCOPY W/ URETERAL STENT PLACEMENT Left 03/02/2022   Procedure: CYSTOSCOPY WITH STENT EXCHANGE;  Surgeon: Twylla Glendia BROCKS, MD;  Location: [facility] ORS;  Service: Urology;  Laterality: Left;   CYSTOSCOPY W/ URETERAL STENT PLACEMENT Left 12/28/2022   Procedure: CYSTOSCOPY WITH STENT EXCHANGE;  Surgeon: Twylla Glendia BROCKS, MD;  Location: [facility] ORS;  Service: Urology;  Laterality: Left;   CYSTOSCOPY WITH STENT PLACEMENT Left 11/25/2020   Procedure: CYSTOSCOPY WITH STENT PLACEMENT;  Surgeon: Twylla Glendia BROCKS, MD;  Location: [facility] ORS;  Service: Urology;  Laterality: Left;   ENDOBRONCHIAL ULTRASOUND Bilateral 04/19/2023   Procedure: ENDOBRONCHIAL ULTRASOUND (EBUS);  Surgeon: Isadora Hose, MD;  Location: [facility] ORS;  Service: Pulmonary;  Laterality: Bilateral;   FLEXIBLE BRONCHOSCOPY N/A 03/10/2017   Procedure: FLEXIBLE BRONCHOSCOPY;  Surgeon: Volney Lye, MD;  Location: [facility] ORS;  Service: Thoracic;  Laterality: N/A;   THORACOSCOPY WITH WEDGE RESECTION LUNG Right 03/13/2014   RUL   THORACOTOMY/LOBECTOMY Left 03/10/2017   Procedure: THORACOTOMY WITH LUNG WEDGE RESECTION POSSIBLE LOBECTOMY;  Surgeon: Volney Lye, MD;  Location: [facility] ORS;  Service: Thoracic;  Laterality: Left;   TUBAL LIGATION      Family History  Problem Relation Age of Onset   Alcohol abuse Mother    Heart attack Father    Breast cancer Neg Hx     Social History:  reports that she quit smoking about 35 years ago. Her smoking use included cigarettes. She started smoking about 70 years ago. She has a 35 pack-year smoking history. She has never used smokeless tobacco. She reports current alcohol use. She reports that she does not use drugs.   Allergies:  Allergies  Allergen Reactions   Ace Inhibitors Swelling   Contrast Media [Iodinated Contrast Media]    Cortisone     Patient had  facial swelling and itching from cortisone injection IM    Current Medications: Current Outpatient Medications  Medication Sig Dispense Refill   amLODipine  (NORVASC ) 5 MG tablet Take 5 mg by mouth at bedtime.     atenolol  (TENORMIN ) 50 MG tablet Take 50 mg by mouth at bedtime.     calcium -vitamin D  (OSCAL WITH D) 500-200 MG-UNIT tablet Take 1 tablet by mouth daily with breakfast.     cetirizine (ZYRTEC) 10 MG tablet Take 10 mg by mouth daily.     dextromethorphan-guaiFENesin  (MUCINEX  DM) 30-600 MG 12hr tablet Take 1 tablet by mouth 2 (two) times daily as needed for cough.     donepezil  (ARICEPT ) 10 MG tablet Take 10 mg by mouth at bedtime.     fluticasone  (FLONASE ) 50 MCG/ACT nasal spray Place 1 spray into the nose daily  as needed for allergies.      hydrOXYzine (ATARAX) 10 MG tablet Take 10 mg by mouth 3 (three) times daily as needed.     simvastatin  (ZOCOR ) 20 MG tablet Take 20 mg by mouth every morning.     vitamin B-12 (CYANOCOBALAMIN ) 100 MCG tablet Take 100 mcg by mouth daily.     nystatin  (MYCOSTATIN ) 100000 UNIT/ML suspension Take 5 mLs (500,000 Units total) by mouth 4 (four) times daily. (Patient not taking: Reported on 12/08/2023) 473 mL 0   No current facility-administered medications for this visit.   Review of Systems  Constitutional:  Negative for appetite change, chills, fatigue and fever.  HENT:   Positive for hearing loss. Negative for voice change.   Eyes:  Negative for eye problems.  Respiratory:  Negative for chest tightness and cough.   Cardiovascular:  Negative for chest pain.  Gastrointestinal:  Negative for abdominal distention, abdominal pain, blood in stool and nausea.  Endocrine: Negative for hot flashes.  Genitourinary:  Negative for difficulty urinating and frequency.   Musculoskeletal:  Negative for arthralgias.       Chronic intermittent back pain  Skin:  Negative for itching and rash.  Neurological:  Negative for extremity weakness.  Hematological:   Negative for adenopathy.  Psychiatric/Behavioral:  Negative for confusion.     Performance status (ECOG): 0 - Asymptomatic  Vital Signs BP 134/60   Pulse (!) 58   Temp (!) 97.2 F (36.2 C)   Resp 18   Wt 127 lb (57.6 kg)   SpO2 98%   BMI 23.23 kg/m   Physical Exam Constitutional:      General: She is not in acute distress.    Appearance: She is not diaphoretic.  HENT:     Head: Normocephalic and atraumatic.  Eyes:     General: No scleral icterus. Cardiovascular:     Rate and Rhythm: Normal rate and regular rhythm.  Pulmonary:     Effort: Pulmonary effort is normal. No respiratory distress.  Abdominal:     General: There is no distension.     Palpations: Abdomen is soft.     Tenderness: There is no abdominal tenderness.  Musculoskeletal:        General: Normal range of motion.     Cervical back: Normal range of motion and neck supple.  Skin:    General: Skin is dry.     Findings: No erythema.  Neurological:     Mental Status: She is alert and oriented to person, place, and time. Mental status is at baseline.  Psychiatric:        Mood and Affect: Mood and affect normal.     RADIOGRAPHIC STUDIES: I have personally reviewed the radiological images as listed and agreed with the findings in the report. CT Chest Wo Contrast Result Date: 11/27/2023 CLINICAL DATA:  History of right upper lobe adenocarcinoma status post wedge resection 03/13/2014 and left lower lobe adenocarcinoma status post wedge resection 03/10/2017 with locally recurrent non-small-cell lung cancer status post chemoradiation, now on immunotherapy. * Tracking Code: BO * EXAM: CT CHEST WITHOUT CONTRAST TECHNIQUE: Multidetector CT imaging of the chest was performed following the standard protocol without IV contrast. RADIATION DOSE REDUCTION: This exam was performed according to the departmental dose-optimization program which includes automated exposure control, adjustment of the mA and/or kV according to  patient size and/or use of iterative reconstruction technique. COMPARISON:  CT chest dated 07/27/2023 FINDINGS: Cardiovascular: Normal heart size. No significant pericardial fluid/thickening. Great vessels are normal  in course and caliber. Coronary artery calcifications and aortic atherosclerosis. Mediastinum/Nodes: Imaged thyroid  gland without nodules meeting criteria for imaging follow-up by size. Small hiatal hernia. Decreased size of previously hypermetabolic precarinal lymph node measuring 5 mm (2:57), previously 10 mm. New lymphadenopathy. Lungs/Pleura: The central airways are patent. Postsurgical changes of the right upper and left lower lobes. Biapical pleural-parenchymal scarring. Mild-to-moderate centrilobular and paraseptal emphysema. Unchanged architectural distortion in the medial left upper lobe (4:33). Scattered pulmonary nodules are also unchanged: -8 x 6 mm posteromedial right upper lobe (4:58) -6 mm perifissural left upper lobe (4:85) -3 mm anterior right middle lobe (4:95) -8 x 6 mm basilar left lower lobe (4:124) Unchanged posterior right lower lobe ground-glass nodule measures 1.7 x 1.4 cm (4:86). No pneumothorax. No pleural effusion. Upper abdomen: Unchanged hepatic hypodensities, likely cysts. Musculoskeletal: No acute or abnormal lytic or blastic osseous lesions. Multilevel degenerative changes of the thoracic spine. IMPRESSION: 1. Continued decrease in size of previously hypermetabolic precarinal lymph node. No new lymphadenopathy. 2. Unchanged bilateral solid pulmonary nodules and right lower lobe ground-glass nodule. 3. Aortic Atherosclerosis (ICD10-I70.0) and Emphysema (ICD10-J43.9). Coronary artery calcifications. Assessment for potential risk factor modification, dietary therapy or pharmacologic therapy may be warranted, if clinically indicated. Electronically Signed   By: Limin  Xu M.D.   On: 11/27/2023 12:09   Abdomen 1 view (KUB) Result Date: 10/13/2023 EXAM: 1 VIEW XRAY OF THE  ABDOMEN 10/12/2023 12:39:00 PM COMPARISON: CT AP 07/11/2023 CLINICAL HISTORY: Kidney stone. Kidney stones. FINDINGS: LINES, TUBES AND DEVICES: Left ureteral stent in place. BOWEL: Nonobstructive bowel gas pattern. SOFT TISSUES: Right upper quadrant calcifications, likely vascular. BONES: Degenerative changes of the lumbar spine. No acute osseous abnormality. IMPRESSION: 1. Left ureteral stent in place. No urinary tract calculi identified. 2. Right upper quadrant calcifications, likely vascular. 3. Degenerative changes of the lumbar spine. Electronically signed by: Waddell Calk MD 10/13/2023 04:10 PM EDT RP Workstation: [id number]    Laboratory results were reviewed by me    Latest Ref Rng & Units 12/08/2023    8:10 AM 11/17/2023   10:17 AM 11/03/2023   10:17 AM  CBC  WBC 4.0 - 10.5 K/uL 7.8  8.3  5.8   Hemoglobin 12.0 - 15.0 g/dL 84.9  84.5  85.1   Hematocrit 36.0 - 46.0 % 45.4  46.2  44.5   Platelets 150 - 400 K/uL 162  159  215       Latest Ref Rng & Units 12/08/2023    8:10 AM 11/17/2023   10:17 AM 11/03/2023   10:17 AM  CMP  Glucose 70 - 99 mg/dL 894  886  99   BUN 8 - 23 mg/dL 23  24  19    Creatinine 0.44 - 1.00 mg/dL 9.19  9.08  9.07   Sodium 135 - 145 mmol/L 140  138  136   Potassium 3.5 - 5.1 mmol/L 4.4  4.5  4.2   Chloride 98 - 111 mmol/L 104  102  103   CO2 22 - 32 mmol/L 27  26  24    Calcium  8.9 - 10.3 mg/dL 9.2  9.2  9.1   Total Protein 6.5 - 8.1 g/dL 6.6  6.3  6.8   Total Bilirubin 0.0 - 1.2 mg/dL 0.6  0.7  0.7   Alkaline Phos 38 - 126 U/L 52  49  60   AST 15 - 41 U/L 19  23  23    ALT 0 - 44 U/L 23  23  12

## 2023-12-08 NOTE — Assessment & Plan Note (Signed)
#  History of stage Ib right lung upper lobe adenocarcinoma status post wedge resection in 2016 History of left lower lobe adenocarcinoma T1 a NX status post wedge resection 2019. Recurrence - Biopsy of pretracheal lymph node positive for NSCLC.  MRI brain is negative.  Recommend concurrent chemotherapy [carboplatin  taxol ]  with radiation.  Labs are reviewed and discussed with patient. S/p concurrent carboplatin  AUC2, taxol  45mg /m with Radiation.    Recommend immunotherapy consolidation with Durvalumab  Q2weeks for up to a year.  Labs are reviewed and discussed with patient. Oct 2025 CT showed treatment response.  Proceed with Durvalumab

## 2023-12-08 NOTE — Assessment & Plan Note (Signed)
Same plan as above.  

## 2023-12-15 ENCOUNTER — Ambulatory Visit

## 2023-12-15 ENCOUNTER — Other Ambulatory Visit

## 2023-12-15 ENCOUNTER — Ambulatory Visit: Admitting: Oncology

## 2023-12-15 DIAGNOSIS — N135 Crossing vessel and stricture of ureter without hydronephrosis: Secondary | ICD-10-CM

## 2023-12-15 LAB — URINALYSIS, COMPLETE
Bilirubin, UA: NEGATIVE
Glucose, UA: NEGATIVE
Ketones, UA: NEGATIVE
Nitrite, UA: NEGATIVE
Specific Gravity, UA: 1.025 (ref 1.005–1.030)
Urobilinogen, Ur: 0.2 mg/dL (ref 0.2–1.0)
pH, UA: 6 (ref 5.0–7.5)

## 2023-12-15 LAB — MICROSCOPIC EXAMINATION
Epithelial Cells (non renal): >10 /HPF — AB (ref 0–10)
RBC, Urine: >30 /HPF — AB (ref 0–2)

## 2023-12-16 ENCOUNTER — Encounter
Admission: RE | Admit: 2023-12-16 | Discharge: 2023-12-16 | Disposition: A | Discharge status: Home / Self Care | Source: Ambulatory Visit | Attending: Urology | Admitting: Urology

## 2023-12-16 ENCOUNTER — Other Ambulatory Visit: Payer: Self-pay

## 2023-12-16 DIAGNOSIS — N135 Crossing vessel and stricture of ureter without hydronephrosis: Secondary | ICD-10-CM

## 2023-12-16 DIAGNOSIS — I10 Essential (primary) hypertension: Secondary | ICD-10-CM

## 2023-12-16 HISTORY — DX: Dyspnea, unspecified: R06.00

## 2023-12-16 HISTORY — DX: Crossing vessel and stricture of ureter without hydronephrosis: N13.5

## 2023-12-16 NOTE — Patient Instructions (Addendum)
 Your procedure is scheduled on: 12/04 25 - Thursday Report to the Registration Desk on the 1st floor of the [facility]. To find out your arrival time, please call [phone] between 1PM - 3PM on: 12/28/23 - Wednesday If your arrival time is 6:00 am, do not arrive before that time as the [facility] entrance doors do not open until 6:00 am.  REMEMBER: Instructions that are not followed completely may result in serious medical risk, up to and including death; or upon the discretion of your surgeon and anesthesiologist your surgery may need to be rescheduled.  Do not eat food or drink any liquids after midnight the night before surgery.  No gum chewing or hard candies.  One week prior to surgery: Stop Anti-inflammatories (NSAIDS) such as Advil, Aleve, Ibuprofen, Motrin, Naproxen, Naprosyn and Aspirin based products such as Excedrin, Goody's Powder, BC Powder. You may take Tylenol  if needed for pain up until the day of surgery.  Stop ANY OVER THE COUNTER supplements until after surgery.  ON THE DAY OF SURGERY ONLY TAKE THESE MEDICATIONS WITH SIPS OF WATER:  atenolol  (TENORMIN )   No Alcohol for 24 hours before or after surgery.  No Smoking including e-cigarettes for 24 hours before surgery.  No chewable tobacco products for at least 6 hours before surgery.  No nicotine patches on the day of surgery.  Do not use any recreational drugs for at least a week (preferably 2 weeks) before your surgery.  Please be advised that the combination of cocaine and anesthesia may have negative outcomes, up to and including death. If you test positive for cocaine, your surgery will be cancelled.  On the morning of surgery brush your teeth with toothpaste and water, you may rinse your mouth with mouthwash if you wish. Do not swallow any toothpaste or mouthwash.  Do not wear jewelry, make-up, hairpins, clips or nail polish.  For welded (permanent) jewelry: bracelets, anklets, waist bands, etc.   Please have this removed prior to surgery.  If it is not removed, there is a chance that hospital personnel will need to cut it off on the day of surgery.  Do not wear lotions, powders, or perfumes.   Do not shave body hair from the neck down 48 hours before surgery.  Contact lenses, hearing aids and dentures may not be worn into surgery.  Do not bring valuables to the hospital. [facility] is not responsible for any missing/lost belongings or valuables.   Notify your doctor if there is any change in your medical condition (cold, fever, infection).  Wear comfortable clothing (specific to your surgery type) to the hospital.  After surgery, you can help prevent lung complications by doing breathing exercises.  Take deep breaths and cough every 1-2 hours. Your doctor may order a device called an Incentive Spirometer to help you take deep breaths.  If you are being admitted to the hospital overnight, leave your suitcase in the car. After surgery it may be brought to your room.  In case of increased patient census, it may be necessary for you, the patient, to continue your postoperative care in the Same Day Surgery department.  If you are being discharged the day of surgery, you will not be allowed to drive home. You will need a responsible individual to drive you home and stay with you for 24 hours after surgery.   If you are taking public transportation, you will need to have a responsible individual with you.  Please call the Pre-admissions Testing  Dept. at [phone] if you have any questions about these instructions.  Surgery Visitation Policy:  Patients having surgery or a procedure may have two visitors.  Children under the age of 82 must have an adult with them who is not the patient.  Inpatient Visitation:    Visiting hours are 7 a.m. to 8 p.m. Up to four visitors are allowed at one time in a patient room. The visitors may rotate out with other people during the day.  One  visitor age 53 or older may stay with the patient overnight and must be in the room by 8 p.m.   Merchandiser, Retail to address health-related social needs:  [email or web address]

## 2023-12-19 LAB — CULTURE, URINE COMPREHENSIVE

## 2023-12-20 ENCOUNTER — Inpatient Hospital Stay: Admission: RE | Admit: 2023-12-20 | Source: Ambulatory Visit

## 2023-12-21 ENCOUNTER — Encounter
Admission: RE | Admit: 2023-12-21 | Discharge: 2023-12-21 | Disposition: A | Discharge status: Home / Self Care | Source: Ambulatory Visit | Attending: Urology | Admitting: Urology

## 2023-12-21 DIAGNOSIS — I10 Essential (primary) hypertension: Secondary | ICD-10-CM | POA: Diagnosis not present

## 2023-12-21 DIAGNOSIS — Z01810 Encounter for preprocedural cardiovascular examination: Secondary | ICD-10-CM | POA: Insufficient documentation

## 2023-12-27 ENCOUNTER — Inpatient Hospital Stay

## 2023-12-27 ENCOUNTER — Encounter: Payer: Self-pay | Admitting: Oncology

## 2023-12-27 ENCOUNTER — Inpatient Hospital Stay: Admitting: Oncology

## 2023-12-27 ENCOUNTER — Inpatient Hospital Stay: Attending: Oncology

## 2023-12-27 VITALS — BP 154/68 | HR 60

## 2023-12-27 VITALS — BP 159/66 | HR 67 | Temp 97.0°F | Resp 18 | Wt 132.0 lb

## 2023-12-27 DIAGNOSIS — J432 Centrilobular emphysema: Secondary | ICD-10-CM | POA: Insufficient documentation

## 2023-12-27 DIAGNOSIS — Z923 Personal history of irradiation: Secondary | ICD-10-CM | POA: Insufficient documentation

## 2023-12-27 DIAGNOSIS — C3411 Malignant neoplasm of upper lobe, right bronchus or lung: Secondary | ICD-10-CM

## 2023-12-27 DIAGNOSIS — K449 Diaphragmatic hernia without obstruction or gangrene: Secondary | ICD-10-CM | POA: Insufficient documentation

## 2023-12-27 DIAGNOSIS — Z87891 Personal history of nicotine dependence: Secondary | ICD-10-CM | POA: Diagnosis not present

## 2023-12-27 DIAGNOSIS — M47814 Spondylosis without myelopathy or radiculopathy, thoracic region: Secondary | ICD-10-CM | POA: Insufficient documentation

## 2023-12-27 DIAGNOSIS — Z79899 Other long term (current) drug therapy: Secondary | ICD-10-CM | POA: Insufficient documentation

## 2023-12-27 DIAGNOSIS — E782 Mixed hyperlipidemia: Secondary | ICD-10-CM | POA: Diagnosis not present

## 2023-12-27 DIAGNOSIS — Z9221 Personal history of antineoplastic chemotherapy: Secondary | ICD-10-CM | POA: Diagnosis not present

## 2023-12-27 DIAGNOSIS — C770 Secondary and unspecified malignant neoplasm of lymph nodes of head, face and neck: Secondary | ICD-10-CM | POA: Insufficient documentation

## 2023-12-27 DIAGNOSIS — N1330 Unspecified hydronephrosis: Secondary | ICD-10-CM | POA: Insufficient documentation

## 2023-12-27 DIAGNOSIS — I10 Essential (primary) hypertension: Secondary | ICD-10-CM | POA: Insufficient documentation

## 2023-12-27 DIAGNOSIS — Z5112 Encounter for antineoplastic immunotherapy: Secondary | ICD-10-CM | POA: Diagnosis not present

## 2023-12-27 DIAGNOSIS — I2510 Atherosclerotic heart disease of native coronary artery without angina pectoris: Secondary | ICD-10-CM | POA: Diagnosis not present

## 2023-12-27 DIAGNOSIS — C3432 Malignant neoplasm of lower lobe, left bronchus or lung: Secondary | ICD-10-CM | POA: Diagnosis not present

## 2023-12-27 DIAGNOSIS — I700 Atherosclerosis of aorta: Secondary | ICD-10-CM | POA: Diagnosis not present

## 2023-12-27 LAB — CBC WITH DIFFERENTIAL (CANCER CENTER ONLY)
Abs Immature Granulocytes: 0.02 K/uL (ref 0.00–0.07)
Basophils Absolute: 0 K/uL (ref 0.0–0.1)
Basophils Relative: 0 %
Eosinophils Absolute: 0 K/uL (ref 0.0–0.5)
Eosinophils Relative: 1 %
HCT: 42.2 % (ref 36.0–46.0)
Hemoglobin: 14.2 g/dL (ref 12.0–15.0)
Immature Granulocytes: 0 %
Lymphocytes Relative: 20 %
Lymphs Abs: 1.1 K/uL (ref 0.7–4.0)
MCH: 33 pg (ref 26.0–34.0)
MCHC: 33.6 g/dL (ref 30.0–36.0)
MCV: 98.1 fL (ref 80.0–100.0)
Monocytes Absolute: 0.4 K/uL (ref 0.1–1.0)
Monocytes Relative: 8 %
Neutro Abs: 3.9 K/uL (ref 1.7–7.7)
Neutrophils Relative %: 71 %
Platelet Count: 157 K/uL (ref 150–400)
RBC: 4.3 MIL/uL (ref 3.87–5.11)
RDW: 14.1 % (ref 11.5–15.5)
WBC Count: 5.4 K/uL (ref 4.0–10.5)
nRBC: 0 % (ref 0.0–0.2)

## 2023-12-27 LAB — CMP (CANCER CENTER ONLY)
ALT: 27 U/L (ref 0–44)
AST: 27 U/L (ref 15–41)
Albumin: 3.6 g/dL (ref 3.5–5.0)
Alkaline Phosphatase: 47 U/L (ref 38–126)
Anion gap: 9 (ref 5–15)
BUN: 19 mg/dL (ref 8–23)
CO2: 27 mmol/L (ref 22–32)
Calcium: 9 mg/dL (ref 8.9–10.3)
Chloride: 104 mmol/L (ref 98–111)
Creatinine: 0.84 mg/dL (ref 0.44–1.00)
GFR, Estimated: >60 mL/min (ref >60)
Glucose, Bld: 111 mg/dL — ABNORMAL HIGH (ref 70–99)
Potassium: 4 mmol/L (ref 3.5–5.1)
Sodium: 140 mmol/L (ref 135–145)
Total Bilirubin: 0.7 mg/dL (ref 0.0–1.2)
Total Protein: 6.5 g/dL (ref 6.5–8.1)

## 2023-12-27 LAB — TSH: TSH: 2.97 u[IU]/mL (ref 0.350–4.500)

## 2023-12-27 MED ORDER — SODIUM CHLORIDE 0.9 % IV SOLN
10.0000 mg/kg | Freq: Once | INTRAVENOUS | Status: AC
  Administered 2023-12-27: 500 mg via INTRAVENOUS
  Filled 2023-12-27: qty 10

## 2023-12-27 MED ORDER — SODIUM CHLORIDE 0.9 % IV SOLN
INTRAVENOUS | Status: DC
  Filled 2023-12-27: qty 250

## 2023-12-27 NOTE — Assessment & Plan Note (Signed)
 Immunotherapy plan as listed above

## 2023-12-27 NOTE — Assessment & Plan Note (Signed)
Same plan as above.  

## 2023-12-27 NOTE — Progress Notes (Signed)
 Per dr Babara keep future doses at 500mg 

## 2023-12-27 NOTE — Progress Notes (Signed)
 Established Patient Visit   Chief Complaint: Chief Complaint  Patient presents with  . Hypertension    POC Abn ECG   Date of Service: 12/27/2023 Date of Birth: 1935/11/02 PCP: [name], MD  History of Present Illness: Ms. Heather [name] is a 88 y.o.female patient who was referred by her primary care provider, Dr. Cleotilde, for abnormal ECG and chest pain. She has a history of brain aneurysm, hypertension, hyperlipidemia, lung cancer, and paroxysmal atrial tachycardia.  The patient reports that on the evening of 10/06/2021, she experienced nausea, vomiting x 1, and substernal chest discomfort that would come and go. The chest pain occurred at rest, lasting in total about 4 minutes. She also felt generally weak with abdominal bloating, belching, and flatulence. She took Pepto Bismol, which seemed to relieve the discomfort. She had no recurrence. She presented to her PCP on 10/12/2021 for evaluation of these symptoms. ECG revealed sinus bradycardia at a rate of 59 bpm with lateral T wave abnormalities, which was new compared to previous ECG. She denies a personal history of MI, stroke, or heart failure.   The patient  is followed by oncology for lung cancer, status post wedge resection of left lower lobe mass in 2019. Recent PET scan showed concern for disease recurrence. She  started chemotherapy and radiation.   Patient returns for preoperative cardiovascular evaluation prior to cystoscopy and stent removal by Dr. Hyla off.  The procedure ECG on 12/21/2023 revealed sinus bradycardia with T wave inversions in inferior and lateral leads which appeared more prominent compared to prior ECG from 10/12/2021.  The patient denies exertional chest pain or shortness of breath.  She denies palpitations or heart racing.  She denies peripheral edema.  The patient is active but does not exercise.  The patient underwent stress echocardiogram on 10/29/2021. She exercised for 5 minutes on a Bruce protocol, achieving a  work load of 7 METs. At baseline, 2D echocardiogram revealed normal left ventricular function with LVEF greater than 55%. At peak exercise, there was appropriate augmentation of all myocardial segments. ECG was normal during stress. There was mild mitral and tricuspid regurgitation. The stress test was overall normal.   The patient has central hypertension, systolic blood pressure mildly elevated, currently on atenolol , and amlodipine , which are well-tolerated without apparent side effects.  The patient follows a low-sodium, no added salt diet.  The patient has hyperlipidemia, LDL 92 on 10/25/2022, on simvastatin , which is tolerated well without apparent side effects, followed by her primary care provider.  The patient follows a low-cholesterol, low-fat diet.  Past Medical and Surgical History  Past Medical History Past Medical History:  Diagnosis Date  . Allergic state   . Brain aneurysm (HHS-HCC)   . Carotid stenosis 06/19/2013  . Colon polyps   . GERD (gastroesophageal reflux disease)   . Hyperlipidemia   . Hypertension   . 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 Multiple thyroid  nodules    Last thyroid  ultrasound 08/2014 (Dr Damian)  . Other nonspecific abnormal finding   . PAT (paroxysmal atrial tachycardia) (HHS-HCC) 06/19/2013  . Plantar fascial fibromatosis     Past Surgical History She has a past surgical history that includes Tubal ligation; Mastectomy partial / lumpectomy (Right); Cryosurgery; Coil treatment; Colonoscopy (10/21/2004); Colonoscopy (09/18/2012); thoracoscopy with wedge resection lung (Right, 02/2014); Colonoscopy (03/31/2015); and kidney stent.   Medications and Allergies  Current Medications  Current Outpatient Medications  Medication Sig Dispense Refill  . amLODIPine  (  NORVASC ) 5 MG tablet Take 1 tablet (5 mg total) by mouth at bedtime 30 tablet 11  . atenoloL  (TENORMIN ) 50 MG tablet Take 1 tablet (50 mg total) by  mouth once daily 90 tablet 3  . cetirizine (ZYRTEC) 10 MG tablet Take 10 mg by mouth every morning    . cholecalciferol  (VITAMIN D3) 1000 unit tablet Take 1 tablet (1,000 Units total) by mouth once daily    . cyanocobalamin  (VITAMIN B12) 100 MCG tablet Take 1 tablet (100 mcg total) by mouth once daily    . donepeziL  (ARICEPT ) 10 MG tablet Take 1 tablet (10 mg total) by mouth at bedtime 90 tablet 3  . fluticasone  (FLONASE ) 50 mcg/actuation nasal spray Place 1 spray into both nostrils 2 (two) times daily. (Patient taking differently: Place 2 sprays into both nostrils once daily as needed) 16 g 0  . simvastatin  (ZOCOR ) 20 MG tablet Take 1 tablet (20 mg total) by mouth at bedtime 90 tablet 3  . chlorthalidone 25 MG tablet Take 1 tablet (25 mg total) by mouth once daily as needed (edema) 30 tablet 11   No current facility-administered medications for this visit.    Allergies: Iodinated contrast media, Ace inhibitors, Arb-angiotensin receptor antagonist, and Metrizamide  Social and Family History  Social History  reports that she quit smoking about 37 years ago. Her smoking use included cigarettes. She started smoking about 52 years ago. She has a 15 pack-year smoking history. She has never used smokeless tobacco. She reports current alcohol use. She reports that she does not use drugs.  Family History Family History  Problem Relation Name Age of Onset  . Alcohol abuse Mother    . Coronary Artery Disease (Blocked arteries around heart) Father    . Myocardial Infarction (Heart attack) Father  36  . Coronary Artery Disease (Blocked arteries around heart) Other Maternal side of family        mother's side of the family  . Lupus Sister    . Cerebral aneurysm Sister      Review of Systems   Review of Systems: The patient denies chest pain, shortness of breath, orthopnea, paroxysmal nocturnal dyspnea, pedal edema, palpitations, heart racing, presyncope, syncope, with memory decline. Review of 8  Systems is negative except as described above.  Physical Examination   Vitals:BP (!) 140/80   Pulse 62   Ht 149.9 cm (4' 11)   Wt 59.4 kg (131 lb)   SpO2 96%   BMI 26.46 kg/m  Ht:149.9 cm (4' 11) Wt:59.4 kg (131 lb) ADJ:Anib surface area is 1.57 meters squared. Body mass index is 26.46 kg/m.  General: Alert and oriented. Well-appearing. No acute distress. HEENT: Pupils equally reactive to light and accomodation    Neck: no JVD Lungs: Normal effort of breathing; clear to auscultation bilaterally; no wheezes, rales, rhonchi Heart: Regular rate and rhythm. No murmur, rub, or gallop Abdomen: nondistended Extremities: no cyanosis, clubbing, or edema Peripheral Pulses: 2+ radial  Skin: Warm, dry, no diaphoresis   Assessment   88 y.o. female with  1. Essential hypertension   2. PAT (paroxysmal atrial tachycardia) (HHS-HCC)   3. Hyperlipidemia, mixed   4. Preop cardiovascular exam   5. Abnormal ECG     88 year old female with multiple cardiovascular risk factors referred for evaluation of an episode of nonexertional chest discomfort, brief in nature, with associated nausea and vomiting x1 with ECG changes. Stress echocardiogram revealed normal LV function at rest and at peak exercise with no wall motion abnormalities.  The patient returns for preoperative evaluation, after preoperative ECG 12/21/2023 revealed more prominent T wave abnormalities inferolaterally, in the absence of chest pain.    Plan    Continue current medications  Constipation bowel low-sodium diet 3.    DASH diet print instructions given to the patient 4.    Constipation bowel low-cholesterol diet 5.    Continue simvastatin  for hyperlipidemia management 6.    Low-fat and cholesterol diet print instructions given to the patient  Stress echocardiogram   Return to clinic after stress echocardiogram  Orders Placed This Encounter  Procedures  . ECG 12-lead  . ECG stress test only  . Echo stress test     Return in about 6 months (around 06/26/2024).   MARSA DOOMS, MD PhD [facility]

## 2023-12-27 NOTE — Progress Notes (Signed)
 Hematology/Oncology Progress note [phone] Fax:([phone]) [phone]     Chief Complaint:  Follow up for recurrent non small cell lung cancer   ASSESSMENT & PLAN:  Heather [name] is a 88 y.o. Heather [name] female with stage IB adenocarcinoma of the right upper lobe status post wedge resection on 03/13/2014. T2aN1M0 (stage IB).  Heather [name] is s/p wedge resection of a left lower lobe mass on 03/10/2017. T1aNx (stage IA). No with locally recurrent NSCLC   Malignant neoplasm of lower lobe of left lung (HCC) #History of stage Ib right lung upper lobe adenocarcinoma status post wedge resection in 2016 History of left lower lobe adenocarcinoma T1 a NX status post wedge resection 2019. Recurrence - Biopsy of pretracheal lymph node positive for NSCLC.  MRI brain is negative.  Recommend concurrent chemotherapy [carboplatin  taxol ]  with radiation.  Labs are reviewed and discussed with Heather [name]. S/p concurrent carboplatin  AUC2, taxol  45mg /m with Radiation.    Recommend immunotherapy consolidation with Durvalumab  Q2weeks for up to a year.  Labs are reviewed and discussed with Heather [name]. Oct 2025 CT showed treatment response.  Proceed with Durvalumab    Encounter for antineoplastic immunotherapy Immunotherapy plan as listed above.  Primary cancer of right upper lobe of lung [facility]) Same plan as above.    No orders of the defined types were placed in this encounter.  Follow up per LOS. All questions were answered. The Heather [name] knows to call the clinic with any problems, questions or concerns.  We spent sufficient time to discuss many aspect of care, questions were answered to Heather [name]'s satisfaction.   [name], MD, PhD [facility] Hematology Oncology 12/27/2023   PERTINENT ONCOLOGY HISTORY Previously follows up with Dr.Corcoran. Estasblish care with me on 03/15/2018  Oncology History  Primary cancer of right upper lobe of lung (HCC)  03/13/2014 Initial Diagnosis   Primary cancer of right upper lobe of  lung   PET scan on 02/13/2014 revealed a 1 cm right upper lobe hypermetabolic nodule (SUV 3.8) and a 6 mm right upper lobe nodule (no metabolic activity). There was no mediastianl adenopathy.  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 Pathology revealed a 1.1 cm high-grade adenocarcinoma with pleural invasion. Nodes were negative. Pathologic stage was T2aN1M0 (stage IB).     03/30/2023 Imaging   PET  1. Hypermetabolic pretracheal lymph node is concerning for metastatic adenopathy. 2. No evidence of local recurrence in the RIGHT upper lobe. 3. No evidence of metastatic disease in the abdomen pelvis. 4. Small nodule along the LEFT oblique fissure has metabolic activity; however, nodule has been stable in size since 03/16/2021. Favor benign nodule. 5. LEFT hydronephrosis with double-J ureteral stent in place.   05/16/2023 - 06/27/2023 Chemotherapy   Heather [name] is on Treatment Plan : Carboplatin  + Paclitaxel  Weekly X 6 Weeks with XRT     07/31/2023 Imaging   CT chest wo contrast   1. Interval decrease in size of the precarinal lymph node seen on prior studies. This lymph node is seen to be hypermetabolic on PET-CT 03/30/2023. Close continued attention warranted. 2. Stable 8 mm retro hilar left lung nodule. Additional scattered smaller pulmonary nodules bilaterally are unchanged. 3. Stable focus of architectural distortion in the medial left upper lobe with a 5 mm nodular component. 4. Aortic Atherosclerosis (ICD10-I70.0) and Emphysema (ICD10-J43.9).   08/04/2023 -  Chemotherapy   Heather [name] is on Treatment Plan : LUNG Durvalumab  (10) q14d     Malignant neoplasm of lower lobe of left  lung (HCC)  03/10/2017 Initial Diagnosis   Malignant neoplasm of lower lobe of left lung   Chest CT on 02/10/2017 revealed clear interval progression of the posterior left lower lobe pulmonary nodule, now measuring 12 mm in long axis and having a distinctly lobular  contour. Imaging features were very concerning for neoplasm.  There was no change in the 7 mm nodule posterior to the right hilum.   PET scan on 02/18/2017 revealed a 11 x 8 mm posterior left lower lobe pulmonary nodule that had mildly progressed from prior studies and demonstrated mild hypermetabolism (SUV 1.6).  The appearance was considered worrisome for an indolent primary bronchogenic neoplasm.   03/10/2017.  s/p wedge resection of a left lower lobe mass.   Pathology revealed an 8 mm invasive adenocarcinoma with solid and acinar patterns.  There was no visceral invasion.  There was no lymphovascular invasion.  All margins were negative.  Pathologic stage was T1aNx (stage IA).   03/30/2023 Imaging   PET  1. Hypermetabolic pretracheal lymph node is concerning for metastatic adenopathy. 2. No evidence of local recurrence in the RIGHT upper lobe. 3. No evidence of metastatic disease in the abdomen pelvis. 4. Small nodule along the LEFT oblique fissure has metabolic activity; however, nodule has been stable in size since 03/16/2021. Favor benign nodule. 5. LEFT hydronephrosis with double-J ureteral stent in place.   04/27/2023 Cancer Staging   Staging form: Lung, AJCC 8th Edition - Pathologic stage from 04/27/2023: pN2, cM0 - Signed by Babara Call, MD on 04/27/2023 Stage prefix: Recurrence   05/16/2023 - 06/27/2023 Chemotherapy   Heather [name] is on Treatment Plan : Carboplatin  + Paclitaxel  Weekly X 6 Weeks with XRT     07/31/2023 Imaging   CT chest wo contrast   1. Interval decrease in size of the precarinal lymph node seen on prior studies. This lymph node is seen to be hypermetabolic on PET-CT 03/30/2023. Close continued attention warranted. 2. Stable 8 mm retro hilar left lung nodule. Additional scattered smaller pulmonary nodules bilaterally are unchanged. 3. Stable focus of architectural distortion in the medial left upper lobe with a 5 mm nodular component. 4. Aortic Atherosclerosis (ICD10-I70.0)  and Emphysema (ICD10-J43.9).   08/04/2023 -  Chemotherapy   Heather [name] is on Treatment Plan : LUNG Durvalumab  (10) q14d       INTERVAL HISTORY Heather [name] is a 88 y.o. female who has above history reviewed by me today presents for follow up visit for management of recurrent lung cancer, .  Heather [name] reports feeling well.  Heather [name] was accompanied by Heather [name] husband Denies shortness of breath, fatigue, hemoptysis, unintentional weight loss.Denies headache, nausea vomiting, focal weakness Right forearm skin cancer lesion, pending future resection by dermatology.  Heather [name] has abnormal EKG found on work up prior to Heather [name] cystoscopy.   Past Medical History:  Diagnosis Date   Anginal pain    Benign essential hypertension    Brain aneurysm    Carotid artery stenosis    Carotid stenosis 06/19/2013   Colonic polyp    Dyspnea    Emphysema lung (HCC)    GERD (gastroesophageal reflux disease)    Hiatal hernia    Hypercholesteremia    Hyperlipidemia, mixed 06/09/2015   Lung cancer (HCC) 2016   Lung nodule 06/21/2014   Macrocytic 10/06/2014   Multiple thyroid  nodules    Obstruction of left ureteropelvic junction (UPJ)    PAT (paroxysmal atrial tachycardia) 06/19/2013   Plantar fasciitis     Past Surgical History:  Procedure Laterality  Date   BREAST EXCISIONAL BIOPSY Right 40 yrs ago   neg   BREAST LUMPECTOMY     benign   CATARACT EXTRACTION W/ INTRAOCULAR LENS  IMPLANT, BILATERAL Bilateral    CEREBRAL ANEURYSM REPAIR  2004   coil treatment   CERVIX LESION DESTRUCTION     COLONOSCOPY     1995, 2006, 2014   COLONOSCOPY WITH PROPOFOL  N/A 03/31/2015   Procedure: COLONOSCOPY WITH PROPOFOL ;  Surgeon: Lamar ONEIDA Holmes, MD;  Location: [facility] ENDOSCOPY;  Service: Endoscopy;  Laterality: N/A;   CYSTOSCOPY W/ RETROGRADES Left 03/02/2022   Procedure: CYSTOSCOPY WITH RETROGRADE PYELOGRAM;  Surgeon: Twylla Glendia BROCKS, MD;  Location: [facility] ORS;  Service: Urology;  Laterality: Left;   CYSTOSCOPY W/ RETROGRADES Left  12/28/2022   Procedure: CYSTOSCOPY WITH RETROGRADE PYELOGRAM;  Surgeon: Twylla Glendia BROCKS, MD;  Location: [facility] ORS;  Service: Urology;  Laterality: Left;   CYSTOSCOPY W/ URETERAL STENT PLACEMENT Left 03/03/2021   Procedure: CYSTOSCOPY WITH EXCHANGE;  Surgeon: Twylla Glendia BROCKS, MD;  Location: [facility] ORS;  Service: Urology;  Laterality: Left;   CYSTOSCOPY W/ URETERAL STENT PLACEMENT Left 03/02/2022   Procedure: CYSTOSCOPY WITH STENT EXCHANGE;  Surgeon: Twylla Glendia BROCKS, MD;  Location: [facility] ORS;  Service: Urology;  Laterality: Left;   CYSTOSCOPY W/ URETERAL STENT PLACEMENT Left 12/28/2022   Procedure: CYSTOSCOPY WITH STENT EXCHANGE;  Surgeon: Twylla Glendia BROCKS, MD;  Location: [facility] ORS;  Service: Urology;  Laterality: Left;   CYSTOSCOPY WITH STENT PLACEMENT Left 11/25/2020   Procedure: CYSTOSCOPY WITH STENT PLACEMENT;  Surgeon: Twylla Glendia BROCKS, MD;  Location: [facility] ORS;  Service: Urology;  Laterality: Left;   ENDOBRONCHIAL ULTRASOUND Bilateral 04/19/2023   Procedure: ENDOBRONCHIAL ULTRASOUND (EBUS);  Surgeon: Isadora Hose, MD;  Location: [facility] ORS;  Service: Pulmonary;  Laterality: Bilateral;   FLEXIBLE BRONCHOSCOPY N/A 03/10/2017   Procedure: FLEXIBLE BRONCHOSCOPY;  Surgeon: Volney Lye, MD;  Location: [facility] ORS;  Service: Thoracic;  Laterality: N/A;   THORACOSCOPY WITH WEDGE RESECTION LUNG Right 03/13/2014   RUL   THORACOTOMY/LOBECTOMY Left 03/10/2017   Procedure: THORACOTOMY WITH LUNG WEDGE RESECTION POSSIBLE LOBECTOMY;  Surgeon: Volney Lye, MD;  Location: [facility] ORS;  Service: Thoracic;  Laterality: Left;   TUBAL LIGATION      Family History  Problem Relation Age of Onset   Alcohol abuse Mother    Heart attack Father    Breast cancer Neg Hx     Social History:  reports that Heather [name] quit smoking about 35 years ago. Heather [name] smoking use included cigarettes. Heather [name] started smoking about 70 years ago. Heather [name] has a 35 pack-year smoking history. Heather [name] has never used smokeless tobacco. Heather [name] reports current alcohol  use. Heather [name] reports that Heather [name] does not use drugs.   Allergies:  Allergies  Allergen Reactions   Ace Inhibitors Swelling   Contrast Media [Iodinated Contrast Media]    Cortisone     Heather [name] had facial swelling and itching from cortisone injection IM    Current Medications: Current Outpatient Medications  Medication Sig Dispense Refill   amLODipine  (NORVASC ) 5 MG tablet Take 5 mg by mouth at bedtime.     atenolol  (TENORMIN ) 50 MG tablet Take 50 mg by mouth in the morning.     cetirizine (ZYRTEC) 10 MG tablet Take 10 mg by mouth daily.     cholecalciferol  (VITAMIN D3) 25 MCG (1000 UNIT) tablet Take 1,000 Units by mouth daily.     dextromethorphan-guaiFENesin  (MUCINEX  DM) 30-600 MG 12hr tablet Take 1 tablet by mouth 2 (two) times daily as needed  for cough.     donepezil  (ARICEPT ) 10 MG tablet Take 10 mg by mouth at bedtime.     fluticasone  (FLONASE ) 50 MCG/ACT nasal spray Place 1 spray into the nose daily as needed for allergies.      simvastatin  (ZOCOR ) 20 MG tablet Take 20 mg by mouth at bedtime.     vitamin B-12 (CYANOCOBALAMIN ) 100 MCG tablet Take 100 mcg by mouth daily.     No current facility-administered medications for this visit.   Facility-Administered Medications Ordered in Other Visits  Medication Dose Route Frequency Provider Last Rate Last Admin   0.9 %  sodium chloride  infusion   Intravenous Continuous Babara Call, MD 10 mL/hr at 12/27/23 1123 New Bag at 12/27/23 1123   durvalumab  (IMFINZI ) 500 mg in sodium chloride  0.9 % 100 mL chemo infusion  10 mg/kg (Treatment Plan Recorded) Intravenous Once Adelfa Lozito, MD 110 mL/hr at 12/27/23 1211 500 mg at 12/27/23 1211   Review of Systems  Constitutional:  Negative for appetite change, chills, fatigue and fever.  HENT:   Positive for hearing loss. Negative for voice change.   Eyes:  Negative for eye problems.  Respiratory:  Negative for chest tightness and cough.   Cardiovascular:  Negative for chest pain.  Gastrointestinal:  Negative  for abdominal distention, abdominal pain, blood in stool and nausea.  Endocrine: Negative for hot flashes.  Genitourinary:  Negative for difficulty urinating and frequency.   Musculoskeletal:  Negative for arthralgias.       Chronic intermittent back pain  Skin:  Negative for itching and rash.  Neurological:  Negative for extremity weakness.  Hematological:  Negative for adenopathy.  Psychiatric/Behavioral:  Negative for confusion.     Performance status (ECOG): 0 - Asymptomatic  Vital Signs BP (!) 159/66 Comment: Rechecked BP. Heather [name] is aware of higher BP readings.  Pulse 67   Temp (!) 97 F (36.1 C) (Tympanic)   Resp 18   Wt 132 lb (59.9 kg)   SpO2 97%   BMI 24.14 kg/m   Physical Exam Constitutional:      General: Heather [name] is not in acute distress.    Appearance: Heather [name] is not diaphoretic.  HENT:     Head: Normocephalic and atraumatic.  Eyes:     General: No scleral icterus. Cardiovascular:     Rate and Rhythm: Normal rate and regular rhythm.  Pulmonary:     Effort: Pulmonary effort is normal. No respiratory distress.  Abdominal:     General: There is no distension.     Palpations: Abdomen is soft.     Tenderness: There is no abdominal tenderness.  Musculoskeletal:        General: Normal range of motion.     Cervical back: Normal range of motion and neck supple.  Skin:    General: Skin is dry.     Findings: Lesion present. No erythema.  Neurological:     Mental Status: Heather [name] is alert and oriented to person, place, and time. Mental status is at baseline.  Psychiatric:        Mood and Affect: Mood and affect normal.     RADIOGRAPHIC STUDIES: I have personally reviewed the radiological images as listed and agreed with the findings in the report. CT Chest Wo Contrast Result Date: 11/27/2023 CLINICAL DATA:  History of right upper lobe adenocarcinoma status post wedge resection 03/13/2014 and left lower lobe adenocarcinoma status post wedge resection 03/10/2017 with locally  recurrent non-small-cell lung cancer status post chemoradiation, now on immunotherapy. *  Tracking Code: BO * EXAM: CT CHEST WITHOUT CONTRAST TECHNIQUE: Multidetector CT imaging of the chest was performed following the standard protocol without IV contrast. RADIATION DOSE REDUCTION: This exam was performed according to the departmental dose-optimization program which includes automated exposure control, adjustment of the mA and/or kV according to Heather [name] size and/or use of iterative reconstruction technique. COMPARISON:  CT chest dated 07/27/2023 FINDINGS: Cardiovascular: Normal heart size. No significant pericardial fluid/thickening. Great vessels are normal in course and caliber. Coronary artery calcifications and aortic atherosclerosis. Mediastinum/Nodes: Imaged thyroid  gland without nodules meeting criteria for imaging follow-up by size. Small hiatal hernia. Decreased size of previously hypermetabolic precarinal lymph node measuring 5 mm (2:57), previously 10 mm. New lymphadenopathy. Lungs/Pleura: The central airways are patent. Postsurgical changes of the right upper and left lower lobes. Biapical pleural-parenchymal scarring. Mild-to-moderate centrilobular and paraseptal emphysema. Unchanged architectural distortion in the medial left upper lobe (4:33). Scattered pulmonary nodules are also unchanged: -8 x 6 mm posteromedial right upper lobe (4:58) -6 mm perifissural left upper lobe (4:85) -3 mm anterior right middle lobe (4:95) -8 x 6 mm basilar left lower lobe (4:124) Unchanged posterior right lower lobe ground-glass nodule measures 1.7 x 1.4 cm (4:86). No pneumothorax. No pleural effusion. Upper abdomen: Unchanged hepatic hypodensities, likely cysts. Musculoskeletal: No acute or abnormal lytic or blastic osseous lesions. Multilevel degenerative changes of the thoracic spine. IMPRESSION: 1. Continued decrease in size of previously hypermetabolic precarinal lymph node. No new lymphadenopathy. 2. Unchanged  bilateral solid pulmonary nodules and right lower lobe ground-glass nodule. 3. Aortic Atherosclerosis (ICD10-I70.0) and Emphysema (ICD10-J43.9). Coronary artery calcifications. Assessment for potential risk factor modification, dietary therapy or pharmacologic therapy may be warranted, if clinically indicated. Electronically Signed   By: Limin  Xu M.D.   On: 11/27/2023 12:09   Abdomen 1 view (KUB) Result Date: 10/13/2023 EXAM: 1 VIEW XRAY OF THE ABDOMEN 10/12/2023 12:39:00 PM COMPARISON: CT AP 07/11/2023 CLINICAL HISTORY: Kidney stone. Kidney stones. FINDINGS: LINES, TUBES AND DEVICES: Left ureteral stent in place. BOWEL: Nonobstructive bowel gas pattern. SOFT TISSUES: Right upper quadrant calcifications, likely vascular. BONES: Degenerative changes of the lumbar spine. No acute osseous abnormality. IMPRESSION: 1. Left ureteral stent in place. No urinary tract calculi identified. 2. Right upper quadrant calcifications, likely vascular. 3. Degenerative changes of the lumbar spine. Electronically signed by: Waddell Calk MD 10/13/2023 04:10 PM EDT RP Workstation: [id number]    Laboratory results were reviewed by me    Latest Ref Rng & Units 12/27/2023   10:03 AM 12/08/2023    8:10 AM 11/17/2023   10:17 AM  CBC  WBC 4.0 - 10.5 K/uL 5.4  7.8  8.3   Hemoglobin 12.0 - 15.0 g/dL 85.7  84.9  84.5   Hematocrit 36.0 - 46.0 % 42.2  45.4  46.2   Platelets 150 - 400 K/uL 157  162  159       Latest Ref Rng & Units 12/27/2023   10:03 AM 12/08/2023    8:10 AM 11/17/2023   10:17 AM  CMP  Glucose 70 - 99 mg/dL 888  894  886   BUN 8 - 23 mg/dL 19  23  24    Creatinine 0.44 - 1.00 mg/dL 9.15  9.19  9.08   Sodium 135 - 145 mmol/L 140  140  138   Potassium 3.5 - 5.1 mmol/L 4.0  4.4  4.5   Chloride 98 - 111 mmol/L 104  104  102   CO2 22 - 32 mmol/L 27  27  26  Calcium  8.9 - 10.3 mg/dL 9.0  9.2  9.2   Total Protein 6.5 - 8.1 g/dL 6.5  6.6  6.3   Total Bilirubin 0.0 - 1.2 mg/dL 0.7  0.6  0.7   Alkaline Phos 38  - 126 U/L 47  52  49   AST 15 - 41 U/L 27  19  23    ALT 0 - 44 U/L 27  23  23

## 2023-12-27 NOTE — Assessment & Plan Note (Signed)
#  History of stage Ib right lung upper lobe adenocarcinoma status post wedge resection in 2016 History of left lower lobe adenocarcinoma T1 a NX status post wedge resection 2019. Recurrence - Biopsy of pretracheal lymph node positive for NSCLC.  MRI brain is negative.  Recommend concurrent chemotherapy [carboplatin  taxol ]  with radiation.  Labs are reviewed and discussed with patient. S/p concurrent carboplatin  AUC2, taxol  45mg /m with Radiation.    Recommend immunotherapy consolidation with Durvalumab  Q2weeks for up to a year.  Labs are reviewed and discussed with patient. Oct 2025 CT showed treatment response.  Proceed with Durvalumab

## 2023-12-27 NOTE — Patient Instructions (Signed)
 [facility] - A DEPT OF [facility]  Discharge Instructions: Thank you for choosing [facility] to provide your oncology and hematology care.  If you have a lab appointment with the Cancer Center, please go directly to the Cancer Center and check in at the registration area.  Wear comfortable clothing and clothing appropriate for easy access to any Portacath or PICC line.   We strive to give you quality time with your provider. You may need to reschedule your appointment if you arrive late (15 or more minutes).  Arriving late affects you and other patients whose appointments are after yours.  Also, if you miss three or more appointments without notifying the office, you may be dismissed from the clinic at the provider's discretion.      For prescription refill requests, have your pharmacy contact our office and allow 72 hours for refills to be completed.    Today you received the following chemotherapy and/or immunotherapy agents imfinzi      To help prevent nausea and vomiting after your treatment, we encourage you to take your nausea medication as directed.  BELOW ARE SYMPTOMS THAT SHOULD BE REPORTED IMMEDIATELY: *FEVER GREATER THAN 100.4 F (38 C) OR HIGHER *CHILLS OR SWEATING *NAUSEA AND VOMITING THAT IS NOT CONTROLLED WITH YOUR NAUSEA MEDICATION *UNUSUAL SHORTNESS OF BREATH *UNUSUAL BRUISING OR BLEEDING *URINARY PROBLEMS (pain or burning when urinating, or frequent urination) *BOWEL PROBLEMS (unusual diarrhea, constipation, pain near the anus) TENDERNESS IN MOUTH AND THROAT WITH OR WITHOUT PRESENCE OF ULCERS (sore throat, sores in mouth, or a toothache) UNUSUAL RASH, SWELLING OR PAIN  UNUSUAL VAGINAL DISCHARGE OR ITCHING   Items with * indicate a potential emergency and should be followed up as soon as possible or go to the Emergency Department if any problems should occur.  Please show the CHEMOTHERAPY ALERT CARD or IMMUNOTHERAPY  ALERT CARD at check-in to the Emergency Department and triage nurse.  Should you have questions after your visit or need to cancel or reschedule your appointment, please contact [facility] - A DEPT OF [facility]  [phone] and follow the prompts.  Office hours are 8:00 a.m. to 4:30 p.m. Monday - Friday. Please note that voicemails left after 4:00 p.m. may not be returned until the following business day.  We are closed weekends and major holidays. You have access to a nurse at all times for urgent questions. Please call the main number to the clinic [phone] and follow the prompts.  For any non-urgent questions, you may also contact your provider using MyChart. We now offer e-Visits for anyone 4 and older to request care online for non-urgent symptoms. For details visit [email or web address].   Also download the MyChart app! Go to the app store, search "MyChart", open the app, select [facility], and log in with your MyChart username and password.

## 2023-12-28 LAB — T4: T4, Total: 5.3 ug/dL (ref 4.5–12.0)

## 2023-12-28 MED ORDER — LACTATED RINGERS IV SOLN: INTRAVENOUS | Status: DC

## 2023-12-28 MED ORDER — CHLORHEXIDINE GLUCONATE 0.12 % MT SOLN
15.0000 mL | Freq: Once | OROMUCOSAL | Status: AC
  Administered 2023-12-29: 15 mL via OROMUCOSAL

## 2023-12-28 MED ORDER — ORAL CARE MOUTH RINSE: 15.0000 mL | Freq: Once | OROMUCOSAL | Status: AC

## 2023-12-28 MED ORDER — CEFAZOLIN SODIUM-DEXTROSE 2-4 GM/100ML-% IV SOLN
2.0000 g | INTRAVENOUS | Status: AC
  Administered 2023-12-29: 2 g via INTRAVENOUS

## 2023-12-28 NOTE — Progress Notes (Signed)
  Perioperative Services Pre-Admission/Anesthesia Testing    Date: 12/28/23  Name: Heather [name] DOB: [birth year] MRN:   [id number]  Re: Plans for surgery; cardiac clearance  Planned Surgical Procedure(s):     Case: [id number] Date/Time: 12/29/23 0715   Procedures:      CYSTOSCOPY, FLEXIBLE, WITH STENT REPLACEMENT (Left)     CYSTOSCOPY, WITH RETROGRADE PYELOGRAM (Left)   Anesthesia type: Monitor Anesthesia Care   Diagnosis: Obstruction of left ureteropelvic junction (UPJ) [N13.5]   Pre-op diagnosis: Left Ureteropelvic Junction Obstruction   Location: [facility] OR ROOM 10 / [facility]   Surgeons: Twylla Glendia JAYSON, MD        Clinical Notes:  Patient is scheduled for the above procedure on 12/29/2023 with Dr. Glendia JAYSON Twylla, MD.  Received communication from Dr. Ammon on 12/28/2023 advising the patient was seen in the office today and had cardiovascular testing performed.  Per Dr. Ammon, stress echocardiogram results were normal.  Patient cleared to proceed with surgery at an overall LOW risk of significant perioperative complications without the need for further cardiovascular testing.  No further needs from the PAT department identified at this time.  Dorise Pereyra, MSN, APRN, FNP-C, [facility]  Perioperative Services Nurse Practitioner Phone: [phone] Fax: [phone] 12/28/23 2:22 PM  NOTE: This note has been prepared using Dragon dictation software. Despite my best ability to proofread, there is always the potential that unintentional transcriptional errors may still occur from this process.

## 2023-12-29 ENCOUNTER — Ambulatory Visit: Admission: RE | Admit: 2023-12-29 | Discharge: 2023-12-29 | Disposition: A | Attending: Urology | Admitting: Urology

## 2023-12-29 ENCOUNTER — Ambulatory Visit: Admitting: Oncology

## 2023-12-29 ENCOUNTER — Ambulatory Visit: Payer: Self-pay | Admitting: Urgent Care

## 2023-12-29 ENCOUNTER — Other Ambulatory Visit

## 2023-12-29 ENCOUNTER — Ambulatory Visit

## 2023-12-29 ENCOUNTER — Other Ambulatory Visit: Payer: Self-pay

## 2023-12-29 ENCOUNTER — Encounter: Payer: Self-pay | Admitting: Urology

## 2023-12-29 ENCOUNTER — Encounter
Admission: RE | Disposition: A | Discharge status: Home / Self Care | Payer: Self-pay | Source: Home / Self Care | Attending: Urology

## 2023-12-29 DIAGNOSIS — N135 Crossing vessel and stricture of ureter without hydronephrosis: Secondary | ICD-10-CM

## 2023-12-29 HISTORY — PX: CYSTOSCOPY W/ RETROGRADES: SHX1426

## 2023-12-29 HISTORY — PX: CYSTOSCOPY W/ URETERAL STENT PLACEMENT: SHX1429

## 2023-12-29 SURGERY — CYSTOSCOPY, FLEXIBLE, WITH STENT REPLACEMENT
Anesthesia: Monitor Anesthesia Care | Site: Ureter | Laterality: Left

## 2023-12-29 MED ORDER — CEFAZOLIN SODIUM-DEXTROSE 2-4 GM/100ML-% IV SOLN
INTRAVENOUS | Status: AC
  Filled 2023-12-29: qty 100

## 2023-12-29 MED ORDER — ONDANSETRON HCL 4 MG/2ML IJ SOLN
INTRAMUSCULAR | Status: DC | PRN
  Administered 2023-12-29: 4 mg via INTRAVENOUS

## 2023-12-29 MED ORDER — FENTANYL CITRATE (PF) 100 MCG/2ML IJ SOLN: 25.0000 ug | INTRAMUSCULAR | Status: DC | PRN

## 2023-12-29 MED ORDER — PROPOFOL 500 MG/50ML IV EMUL
INTRAVENOUS | Status: DC | PRN
  Administered 2023-12-29: 80 ug/kg/min via INTRAVENOUS

## 2023-12-29 MED ORDER — SODIUM CHLORIDE 0.9 % IR SOLN
Status: DC | PRN
  Administered 2023-12-29: 3000 mL

## 2023-12-29 MED ORDER — DROPERIDOL 2.5 MG/ML IJ SOLN: 0.6250 mg | Freq: Once | INTRAMUSCULAR | Status: DC | PRN

## 2023-12-29 MED ORDER — PROPOFOL 10 MG/ML IV BOLUS
INTRAVENOUS | Status: AC
  Filled 2023-12-29: qty 20

## 2023-12-29 MED ORDER — PROPOFOL 1000 MG/100ML IV EMUL
INTRAVENOUS | Status: AC
  Filled 2023-12-29: qty 100

## 2023-12-29 MED ORDER — PROPOFOL 10 MG/ML IV BOLUS
INTRAVENOUS | Status: DC | PRN
  Administered 2023-12-29: 30 mg via INTRAVENOUS

## 2023-12-29 MED ORDER — CHLORHEXIDINE GLUCONATE 0.12 % MT SOLN
OROMUCOSAL | Status: AC
  Filled 2023-12-29: qty 15

## 2023-12-29 MED ORDER — TROSPIUM CHLORIDE 20 MG PO TABS: 20.0000 mg | ORAL_TABLET | Freq: Two times a day (BID) | ORAL | 0 refills | Status: AC | PRN

## 2023-12-29 MED ORDER — FENTANYL CITRATE (PF) 100 MCG/2ML IJ SOLN
INTRAMUSCULAR | Status: AC
  Filled 2023-12-29: qty 2

## 2023-12-29 MED ORDER — OXYCODONE HCL 5 MG/5ML PO SOLN: 5.0000 mg | Freq: Once | ORAL | Status: DC | PRN

## 2023-12-29 MED ORDER — IOHEXOL 180 MG/ML  SOLN
INTRAMUSCULAR | Status: DC | PRN
  Administered 2023-12-29: 10 mL

## 2023-12-29 MED ORDER — ACETAMINOPHEN 10 MG/ML IV SOLN: 1000.0000 mg | Freq: Once | INTRAVENOUS | Status: DC | PRN

## 2023-12-29 MED ORDER — OXYCODONE HCL 5 MG PO TABS: 5.0000 mg | ORAL_TABLET | Freq: Once | ORAL | Status: DC | PRN

## 2023-12-29 SURGICAL SUPPLY — 15 items
BAG DRAIN SIEMENS DORNER NS (MISCELLANEOUS) ×2 IMPLANT
BRUSH SCRUB EZ 4% CHG (MISCELLANEOUS) ×2 IMPLANT
CATH URETL OPEN END 6X70 (CATHETERS) ×2 IMPLANT
DRAPE UTILITY 15X26 TOWEL STRL (DRAPES) ×2 IMPLANT
GLOVE BIOGEL PI IND STRL 7.5 (GLOVE) ×2 IMPLANT
GOWN STRL REUS W/ TWL LRG LVL3 (GOWN DISPOSABLE) ×2 IMPLANT
GOWN STRL REUS W/ TWL XL LVL3 (GOWN DISPOSABLE) ×2 IMPLANT
GUIDEWIRE STR DUAL SENSOR (WIRE) ×2 IMPLANT
KIT TURNOVER CYSTO (KITS) ×2 IMPLANT
PACK CYSTO AR (MISCELLANEOUS) ×2 IMPLANT
SET CYSTO IRRIGATION (SET/KITS/TRAYS/PACK) ×2 IMPLANT
SOL .9 NS 3000ML IRR UROMATIC (IV SOLUTION) ×2 IMPLANT
SOLN STERILE WATER 500 ML (IV SOLUTION) ×2 IMPLANT
STENT URO INLAY 6FRX22CM (STENTS) IMPLANT
SURGILUBE 2OZ TUBE FLIPTOP (MISCELLANEOUS) ×2 IMPLANT

## 2023-12-29 NOTE — Discharge Instructions (Signed)
 DISCHARGE INSTRUCTIONS FOR KIDNEY STONE/URETERAL STENT   MEDICATIONS:  1. Resume all your other meds from home.  2.  AZO (over-the-counter) can help with the burning/stinging when you urinate. 3.  Trospium  is for bladder spasm, Rx was sent to your pharmacy if needed.  ACTIVITY:  1. May resume regular activities in 24 hours.   SIGNS/SYMPTOMS TO CALL:  Common postoperative symptoms include urinary frequency, urgency, bladder spasm and blood in the urine  Please call us  if you have a fever greater than 101.5, uncontrolled nausea/vomiting, uncontrolled pain, dizziness, unable to urinate, excessively bloody urine, chest pain, shortness of breath, leg swelling, leg pain, or any other concerns or questions.   You can reach us  at [phone].   FOLLOW-UP:  1. You will be contacted for a follow-up appointment to be scheduled in approximately 6 months

## 2023-12-29 NOTE — Interval H&P Note (Signed)
 History and Physical Interval Note:  12/29/2023 7:15 AM  Heather [name]  has presented today for surgery, with the diagnosis of Left Ureteropelvic Junction Obstruction.  The various methods of treatment have been discussed with the patient and family. After consideration of risks, benefits and other options for treatment, the patient has consented to  Procedure(s): CYSTOSCOPY, FLEXIBLE, WITH STENT REPLACEMENT (Left) CYSTOSCOPY, WITH RETROGRADE PYELOGRAM (Left) as a surgical intervention.  The patient's history has been reviewed, patient examined, no change in status, stable for surgery.  I have reviewed the patient's chart and labs.  Questions were answered to the patient's satisfaction.     Benancio Osmundson C Mychal Decarlo

## 2023-12-29 NOTE — H&P (Signed)
 Urology H&P  Urologic history:   1. Left UPJ obstruction Chronic left UPJ obstruction, however admitted with pyelonephritis/pyonephrosis with sepsis November 2022 and underwent stent placement.  Elected a chronic indwelling stent.   Assessment/Recommendations:  Presents for annual stent exchange The procedure was cussed glimpses risk of bleeding and infection/sepsis.  All questions were answered and she desires to proceed.   History of Present Illness: Heather [name] is a 88 y.o. female with the above urologic history who presents for left ureteral stent exchange  Recent episode chest pain with abnormal EKG and underwent a stress echocardiogram which was negative and has been cleared for stent exchange Urine culture grew mixed flora No complaints this morning  Past Medical History:  Diagnosis Date   Anginal pain    Benign essential hypertension    Brain aneurysm    Carotid artery stenosis    Carotid stenosis 06/19/2013   Colonic polyp    Dyspnea    Emphysema lung (HCC)    GERD (gastroesophageal reflux disease)    Hiatal hernia    Hypercholesteremia    Hyperlipidemia, mixed 06/09/2015   Lung cancer (HCC) 2016   Lung nodule 06/21/2014   Macrocytic 10/06/2014   Multiple thyroid  nodules    Obstruction of left ureteropelvic junction (UPJ)    PAT (paroxysmal atrial tachycardia) 06/19/2013   Plantar fasciitis     Past Surgical History:  Procedure Laterality Date   BREAST EXCISIONAL BIOPSY Right 40 yrs ago   neg   BREAST LUMPECTOMY     benign   CATARACT EXTRACTION W/ INTRAOCULAR LENS  IMPLANT, BILATERAL Bilateral    CEREBRAL ANEURYSM REPAIR  2004   coil treatment   CERVIX LESION DESTRUCTION     COLONOSCOPY     1995, 2006, 2014   COLONOSCOPY WITH PROPOFOL  N/A 03/31/2015   Procedure: COLONOSCOPY WITH PROPOFOL ;  Surgeon: Lamar ONEIDA Holmes, MD;  Location: [facility] ENDOSCOPY;  Service: Endoscopy;  Laterality: N/A;   CYSTOSCOPY W/ RETROGRADES Left 03/02/2022   Procedure:  CYSTOSCOPY WITH RETROGRADE PYELOGRAM;  Surgeon: Twylla Glendia JAYSON, MD;  Location: [facility] ORS;  Service: Urology;  Laterality: Left;   CYSTOSCOPY W/ RETROGRADES Left 12/28/2022   Procedure: CYSTOSCOPY WITH RETROGRADE PYELOGRAM;  Surgeon: Twylla Glendia JAYSON, MD;  Location: [facility];  Service: Urology;  Laterality: Left;   CYSTOSCOPY W/ URETERAL STENT PLACEMENT Left 03/03/2021   Procedure: CYSTOSCOPY WITH EXCHANGE;  Surgeon: Twylla Glendia JAYSON, MD;  Location: [facility];  Service: Urology;  Laterality: Left;   CYSTOSCOPY W/ URETERAL STENT PLACEMENT Left 03/02/2022   Procedure: CYSTOSCOPY WITH STENT EXCHANGE;  Surgeon: Twylla Glendia JAYSON, MD;  Location: [facility];  Service: Urology;  Laterality: Left;   CYSTOSCOPY W/ URETERAL STENT PLACEMENT Left 12/28/2022   Procedure: CYSTOSCOPY WITH STENT EXCHANGE;  Surgeon: Twylla Glendia JAYSON, MD;  Location: [facility];  Service: Urology;  Laterality: Left;   CYSTOSCOPY WITH STENT PLACEMENT Left 11/25/2020   Procedure: CYSTOSCOPY WITH STENT PLACEMENT;  Surgeon: Twylla Glendia JAYSON, MD;  Location: [facility];  Service: Urology;  Laterality: Left;   ENDOBRONCHIAL ULTRASOUND Bilateral 04/19/2023   Procedure: ENDOBRONCHIAL ULTRASOUND (EBUS);  Surgeon: Isadora Hose, MD;  Location: [facility];  Service: Pulmonary;  Laterality: Bilateral;   FLEXIBLE BRONCHOSCOPY N/A 03/10/2017   Procedure: FLEXIBLE BRONCHOSCOPY;  Surgeon: Volney Lye, MD;  Location: [facility];  Service: Thoracic;  Laterality: N/A;   THORACOSCOPY WITH WEDGE RESECTION LUNG Right 03/13/2014   RUL   THORACOTOMY/LOBECTOMY Left 03/10/2017   Procedure: THORACOTOMY WITH LUNG WEDGE RESECTION POSSIBLE LOBECTOMY;  Surgeon: Volney Lye, MD;  Location: [facility];  Service: Thoracic;  Laterality: Left;   TUBAL LIGATION      Home Medications:  Current Meds  Medication Sig   amLODipine  (NORVASC ) 5 MG tablet Take 5 mg by mouth at bedtime.   atenolol  (TENORMIN ) 50 MG tablet Take 50 mg by mouth in the morning.   [EXPIRED] azithromycin  (ZITHROMAX) 250 MG tablet Take 250 mg by mouth in the morning.   cetirizine (ZYRTEC) 10 MG tablet Take 10 mg by mouth daily.   cholecalciferol  (VITAMIN D3) 25 MCG (1000 UNIT) tablet Take 1,000 Units by mouth daily.   dextromethorphan-guaiFENesin  (MUCINEX  DM) 30-600 MG 12hr tablet Take 1 tablet by mouth 2 (two) times daily as needed for cough.   donepezil  (ARICEPT ) 10 MG tablet Take 10 mg by mouth at bedtime.   fluticasone  (FLONASE ) 50 MCG/ACT nasal spray Place 1 spray into the nose daily as needed for allergies.    [EXPIRED] hydrOXYzine (ATARAX) 10 MG tablet Take 10 mg by mouth 3 (three) times daily as needed for itching.   simvastatin  (ZOCOR ) 20 MG tablet Take 20 mg by mouth at bedtime.   vitamin B-12 (CYANOCOBALAMIN ) 100 MCG tablet Take 100 mcg by mouth daily.    Allergies:  Allergies  Allergen Reactions   Ace Inhibitors Swelling   Contrast Media [Iodinated Contrast Media]    Cortisone     Patient had facial swelling and itching from cortisone injection IM    Family History  Problem Relation Age of Onset   Alcohol abuse Mother    Heart attack Father    Breast cancer Neg Hx     Social History:  reports that she quit smoking about 35 years ago. Her smoking use included cigarettes. She started smoking about 70 years ago. She has a 35 pack-year smoking history. She has never used smokeless tobacco. She reports current alcohol use. She reports that she does not use drugs.  ROS: No chest pain, shortness of breath, fever  Physical Exam:  Vital signs in last 24 hours: Temp:  [96.9 F (36.1 C)] 96.9 F (36.1 C) (12/04 0610) Pulse Rate:  [60] 60 (12/04 0610) Resp:  [17] 17 (12/04 0610) SpO2:  [98 %] 98 % (12/04 0610) Constitutional:  Alert and oriented, No acute distress HEENT: [place] AT, moist mucus membranes.  Trachea midline, no masses Cardiovascular: Regular rate and rhythm Respiratory: Normal respiratory effort, lungs clear bilaterally Psychiatric: Normal mood and  affect   Laboratory Data:  Recent Labs    12/27/23 1003  WBC 5.4  HGB 14.2  HCT 42.2   Recent Labs    12/27/23 1003  NA 140  K 4.0  CL 104  CO2 27  GLUCOSE 111*  BUN 19  CREATININE 0.84  CALCIUM  9.0   No results for input(s): LABPT, INR in the last 72 hours. No results for input(s): LABURIN in the last 72 hours. Results for orders placed or performed in visit on 12/15/23  CULTURE, URINE COMPREHENSIVE     Status: None   Collection Time: 12/15/23  2:40 PM   Specimen: Urine   UR  Result Value Ref Range Status   Urine Culture, Comprehensive Final report  Final   Organism ID, Bacteria Comment  Final    Comment: Mixed urogenital flora 10,000-25,000 colony forming units per mL   Microscopic Examination     Status: Abnormal   Collection Time: 12/15/23  2:40 PM   Urine  Result Value Ref Range Status   WBC,  UA 6-10 (A) 0 - 5 /hpf Final   RBC, Urine >30 (A) 0 - 2 /hpf Final   Epithelial Cells (non renal) >10 (A) 0 - 10 /hpf Final   Casts Present (A) None seen /lpf Final   Cast Type Granular casts (A) N/A Final   Mucus, UA Present (A) Not Estab. Final   Bacteria, UA Moderate (A) None seen/Few Final      12/29/2023, 7:07 AM  Glendia Barba,  MD

## 2023-12-29 NOTE — Anesthesia Preprocedure Evaluation (Signed)
 Anesthesia Evaluation  Patient identified by MRN, date of birth, ID band Patient awake    Reviewed: Allergy & Precautions, H&P , NPO status , Patient's Chart, lab work & pertinent test results, reviewed documented beta blocker date and time   Airway Mallampati: II   Neck ROM: full    Dental  (+) Poor Dentition   Pulmonary shortness of breath and with exertion, COPD, former smoker   Pulmonary exam normal        Cardiovascular Exercise Tolerance: Good hypertension, On Medications + angina with exertion Normal cardiovascular exam Rhythm:regular Rate:Normal  Neg EST yesterday. ja   Neuro/Psych  Neuromuscular disease  negative psych ROS   GI/Hepatic Neg liver ROS, hiatal hernia,GERD  Medicated,,  Endo/Other  negative endocrine ROS    Renal/GU negative Renal ROS  negative genitourinary   Musculoskeletal   Abdominal   Peds  Hematology negative hematology ROS (+)   Anesthesia Other Findings Past Medical History: No date: Anginal pain No date: Benign essential hypertension No date: Brain aneurysm No date: Carotid artery stenosis 06/19/2013: Carotid stenosis No date: Colonic polyp No date: Dyspnea No date: Emphysema lung (HCC) No date: GERD (gastroesophageal reflux disease) No date: Hiatal hernia No date: Hypercholesteremia 06/09/2015: Hyperlipidemia, mixed 2016: Lung cancer (HCC) 06/21/2014: Lung nodule 10/06/2014: Macrocytic No date: Multiple thyroid  nodules No date: Obstruction of left ureteropelvic junction (UPJ) 06/19/2013: PAT (paroxysmal atrial tachycardia) No date: Plantar fasciitis Past Surgical History: 40 yrs ago: BREAST EXCISIONAL BIOPSY; Right     Comment:  neg No date: BREAST LUMPECTOMY     Comment:  benign No date: CATARACT EXTRACTION W/ INTRAOCULAR LENS  IMPLANT, BILATERAL;  Bilateral 2004: CEREBRAL ANEURYSM REPAIR     Comment:  coil treatment No date: CERVIX LESION DESTRUCTION No date:  COLONOSCOPY     Comment:  1995, 2006, 2014 03/31/2015: COLONOSCOPY WITH PROPOFOL ; N/A     Comment:  Procedure: COLONOSCOPY WITH PROPOFOL ;  Surgeon: Lamar ONEIDA Holmes, MD;  Location: [facility] ENDOSCOPY;  Service:               Endoscopy;  Laterality: N/A; 03/02/2022: CYSTOSCOPY W/ RETROGRADES; Left     Comment:  Procedure: CYSTOSCOPY WITH RETROGRADE PYELOGRAM;                Surgeon: Twylla Glendia BROCKS, MD;  Location: [facility];                Service: Urology;  Laterality: Left; 12/28/2022: CYSTOSCOPY W/ RETROGRADES; Left     Comment:  Procedure: CYSTOSCOPY WITH RETROGRADE PYELOGRAM;                Surgeon: Twylla Glendia BROCKS, MD;  Location: [facility];                Service: Urology;  Laterality: Left; 03/03/2021: CYSTOSCOPY W/ URETERAL STENT PLACEMENT; Left     Comment:  Procedure: CYSTOSCOPY WITH EXCHANGE;  Surgeon: Twylla Glendia BROCKS, MD;  Location: [facility];  Service: Urology;                Laterality: Left; 03/02/2022: CYSTOSCOPY W/ URETERAL STENT PLACEMENT; Left     Comment:  Procedure: CYSTOSCOPY WITH STENT EXCHANGE;  Surgeon:               Twylla Glendia BROCKS, MD;  Location: [facility];  Service:  Urology;  Laterality: Left; 12/28/2022: CYSTOSCOPY W/ URETERAL STENT PLACEMENT; Left     Comment:  Procedure: CYSTOSCOPY WITH STENT EXCHANGE;  Surgeon:               Twylla Glendia BROCKS, MD;  Location: [facility];  Service:               Urology;  Laterality: Left; 11/25/2020: CYSTOSCOPY WITH STENT PLACEMENT; Left     Comment:  Procedure: CYSTOSCOPY WITH STENT PLACEMENT;  Surgeon:               Twylla Glendia BROCKS, MD;  Location: [facility];  Service:               Urology;  Laterality: Left; 04/19/2023: ENDOBRONCHIAL ULTRASOUND; Bilateral     Comment:  Procedure: ENDOBRONCHIAL ULTRASOUND (EBUS);  Surgeon:               Isadora Hose, MD;  Location: [facility];  Service:               Pulmonary;  Laterality: Bilateral; 03/10/2017: FLEXIBLE BRONCHOSCOPY; N/A      Comment:  Procedure: FLEXIBLE BRONCHOSCOPY;  Surgeon: Volney Lye, MD;  Location: [facility];  Service: Thoracic;                Laterality: N/A; 03/13/2014: THORACOSCOPY WITH WEDGE RESECTION LUNG; Right     Comment:  RUL 03/10/2017: THORACOTOMY/LOBECTOMY; Left     Comment:  Procedure: THORACOTOMY WITH LUNG WEDGE RESECTION               POSSIBLE LOBECTOMY;  Surgeon: Volney Lye, MD;                Location: [facility];  Service: Thoracic;  Laterality:               Left; No date: TUBAL LIGATION   Reproductive/Obstetrics negative OB ROS                              Anesthesia Physical Anesthesia Plan  ASA: 3  Anesthesia Plan: General and MAC   Post-op Pain Management:    Induction:   PONV Risk Score and Plan:   Airway Management Planned:   Additional Equipment:   Intra-op Plan:   Post-operative Plan:   Informed Consent: I have reviewed the patients History and Physical, chart, labs and discussed the procedure including the risks, benefits and alternatives for the proposed anesthesia with the patient or authorized representative who has indicated his/her understanding and acceptance.     Dental Advisory Given  Plan Discussed with: CRNA  Anesthesia Plan Comments:         Anesthesia Quick Evaluation

## 2023-12-29 NOTE — Transfer of Care (Signed)
 Immediate Anesthesia Transfer of Care Note  Patient: Heather [name]  Procedure(s) Performed: CYSTOSCOPY, FLEXIBLE, WITH STENT REPLACEMENT (Left: Ureter) CYSTOSCOPY, WITH RETROGRADE PYELOGRAM (Left: Bladder)  Patient Location: PACU  Anesthesia Type:General  Level of Consciousness: drowsy  Airway & Oxygen Therapy: Patient Spontanous Breathing and Patient connected to face mask oxygen  Post-op Assessment: Report given to RN  Post vital signs: stable  Last Vitals:  Vitals Value Taken Time  BP 125/73 12/29/23 09:32  Temp    Pulse 64 12/29/23 09:34  Resp 20 12/29/23 09:34  SpO2 100 % 12/29/23 09:34  Vitals shown include unfiled device data.  Last Pain:  Vitals:   12/29/23 0610  TempSrc: Temporal  PainSc: 0-No pain         Complications: No notable events documented.

## 2023-12-29 NOTE — Op Note (Addendum)
° ° °  Preoperative diagnosis:  Chronic left UPJ obstruction  Postoperative diagnosis:  Same  Procedure:  Cystoscopy Left ureteral stent exchange (74F/22 Bard Optima) Left retrograde pyelography with interpretation  Surgeon: Heather C. Hayleen Clinkscales, M.D.  Anesthesia: General  Complications: None  Intraoperative findings:  Cystoscopy: Bladder mucosa with mild inflammatory changes left hemitrigone secondary to indwelling stent.  Mild stent encrustation noted.  No solid or papillary bladder mucosal lesions Left retrograde pyelogram: Moderate-severe hydronephrosis with narrowing at the UPJ  EBL: Minimal  Specimens: None  Indication: Heather [name] is a 88 y.o. female with a chronic left UPJ obstruction managed with an indwelling stent.  She presents today for annual stent exchange.  After reviewing the management options for treatment, she elected to proceed with the above surgical procedure(s). We have discussed the potential benefits and risks of the procedure, side effects of the proposed treatment, the likelihood of the patient achieving the goals of the procedure, and any potential problems that might occur during the procedure or recuperation. Informed consent has been obtained.  Description of procedure:  The patient was taken to the operating room and general anesthesia was induced.  The patient was placed in the dorsal lithotomy position, prepped and draped in the usual sterile fashion, and preoperative antibiotics were administered. A preoperative time-out was performed.   A 21 French cystoscope sheath with obturator was lubricated and passed per urethra.  A 30 degree lens was then placed and panendoscopy was performed with findings as described above.  Attention was directed to the left ureteral orifice and a 0.038 Sensor guidewire was then advanced up the ureter into the renal pelvis under fluoroscopic guidance.  The cystoscope was removed and repassed.  The indwelling stent was  grasped with endoscopic forceps and removed under fluoroscopy without difficulty.    A 4F open-ended ureteral catheter was advanced over the guidewire to the region of the renal pelvis.  The guidewire was removed and 5 mL of Omnipaque  contrast was instilled through the catheter under fluoroscopy with findings described above.  The guidewire was replaced and ureteral catheter was removed.  A 74F/22 cm Bard Optima ureteral stent was advanced over the guidewire.  The stent was positioned appropriately under fluoroscopic and cystoscopic guidance.  The wire was then removed with an adequate stent curl noted in the renal pelvis as well as in the bladder.  The bladder was then emptied and the procedure ended.  The patient appeared to tolerate the procedure well and without complications.  After anesthetic reversal the patient was transported to the PACU in stable condition.  Plan: Office follow-up 6 months Annual stent exchange   Heather Barba, MD

## 2023-12-30 ENCOUNTER — Encounter: Payer: Self-pay | Admitting: Urology

## 2024-01-03 ENCOUNTER — Other Ambulatory Visit: Payer: Self-pay

## 2024-01-04 ENCOUNTER — Telehealth: Payer: Self-pay

## 2024-01-04 NOTE — Telephone Encounter (Signed)
 Heather [name] informed Ok to proceed with dental work.  He would it be OK for her to receive her immunotherapy on 01/10/24?  Plans to have tooth extracted this week and will be placed on antibiotic.

## 2024-01-04 NOTE — Telephone Encounter (Signed)
 Ok to keep immunotherapy appt on 12/16, per Dr. Babara.

## 2024-01-04 NOTE — Telephone Encounter (Signed)
 Rosaline from [facility] calling to request a medical clearance from Dr. Babara for patient to have tooth extraction on 01/05/24.  Letter typed in letter section of chart and will fax once MD has signed.

## 2024-01-04 NOTE — Telephone Encounter (Signed)
 Patient spouse calling to report that patient is needing tooth extraction in the next day or 2.  OK to proceed?  They need to know today so they can contact the dental surgery center to have scheduled.

## 2024-01-10 ENCOUNTER — Encounter: Payer: Self-pay | Admitting: Oncology

## 2024-01-10 ENCOUNTER — Inpatient Hospital Stay: Admitting: Oncology

## 2024-01-10 ENCOUNTER — Inpatient Hospital Stay

## 2024-01-10 VITALS — BP 157/67 | HR 66 | Temp 96.0°F | Resp 18

## 2024-01-10 VITALS — BP 158/77 | HR 61 | Temp 96.5°F | Resp 18 | Wt 132.6 lb

## 2024-01-10 DIAGNOSIS — Z5112 Encounter for antineoplastic immunotherapy: Secondary | ICD-10-CM

## 2024-01-10 DIAGNOSIS — C3411 Malignant neoplasm of upper lobe, right bronchus or lung: Secondary | ICD-10-CM

## 2024-01-10 DIAGNOSIS — C3432 Malignant neoplasm of lower lobe, left bronchus or lung: Secondary | ICD-10-CM

## 2024-01-10 DIAGNOSIS — C770 Secondary and unspecified malignant neoplasm of lymph nodes of head, face and neck: Secondary | ICD-10-CM | POA: Diagnosis not present

## 2024-01-10 LAB — CMP (CANCER CENTER ONLY)
ALT: 16 U/L (ref 0–44)
AST: 23 U/L (ref 15–41)
Albumin: 4.2 g/dL (ref 3.5–5.0)
Alkaline Phosphatase: 52 U/L (ref 38–126)
Anion gap: 10 (ref 5–15)
BUN: 18 mg/dL (ref 8–23)
CO2: 26 mmol/L (ref 22–32)
Calcium: 9.4 mg/dL (ref 8.9–10.3)
Chloride: 107 mmol/L (ref 98–111)
Creatinine: 0.81 mg/dL (ref 0.44–1.00)
GFR, Estimated: >60 mL/min (ref >60)
Glucose, Bld: 91 mg/dL (ref 70–99)
Potassium: 4.2 mmol/L (ref 3.5–5.1)
Sodium: 144 mmol/L (ref 135–145)
Total Bilirubin: 0.4 mg/dL (ref 0.0–1.2)
Total Protein: 6.5 g/dL (ref 6.5–8.1)

## 2024-01-10 LAB — CBC WITH DIFFERENTIAL (CANCER CENTER ONLY)
Abs Immature Granulocytes: 0.01 K/uL (ref 0.00–0.07)
Basophils Absolute: 0 K/uL (ref 0.0–0.1)
Basophils Relative: 1 %
Eosinophils Absolute: 0.1 K/uL (ref 0.0–0.5)
Eosinophils Relative: 1 %
HCT: 41.9 % (ref 36.0–46.0)
Hemoglobin: 13.7 g/dL (ref 12.0–15.0)
Immature Granulocytes: 0 %
Lymphocytes Relative: 18 %
Lymphs Abs: 1 K/uL (ref 0.7–4.0)
MCH: 33.1 pg (ref 26.0–34.0)
MCHC: 32.7 g/dL (ref 30.0–36.0)
MCV: 101.2 fL — ABNORMAL HIGH (ref 80.0–100.0)
Monocytes Absolute: 0.5 K/uL (ref 0.1–1.0)
Monocytes Relative: 9 %
Neutro Abs: 4.1 K/uL (ref 1.7–7.7)
Neutrophils Relative %: 71 %
Platelet Count: 175 K/uL (ref 150–400)
RBC: 4.14 MIL/uL (ref 3.87–5.11)
RDW: 14.6 % (ref 11.5–15.5)
WBC Count: 5.8 K/uL (ref 4.0–10.5)
nRBC: 0 % (ref 0.0–0.2)

## 2024-01-10 MED ORDER — SODIUM CHLORIDE 0.9 % IV SOLN
INTRAVENOUS | Status: DC
  Filled 2024-01-10: qty 250

## 2024-01-10 MED ORDER — SODIUM CHLORIDE 0.9 % IV SOLN
10.0000 mg/kg | Freq: Once | INTRAVENOUS | Status: AC
  Administered 2024-01-10: 12:00:00 500 mg via INTRAVENOUS
  Filled 2024-01-10: qty 10

## 2024-01-10 NOTE — Assessment & Plan Note (Signed)
Same plan as above.  

## 2024-01-10 NOTE — Assessment & Plan Note (Signed)
 Immunotherapy plan as listed above

## 2024-01-10 NOTE — Patient Instructions (Signed)

## 2024-01-10 NOTE — Progress Notes (Signed)
 Hematology/Oncology Progress note [phone] [phone]     Chief Complaint:  Follow up for recurrent non small cell lung cancer   ASSESSMENT & PLAN:  Heather [name] is a 88 y.o. [name] female with stage IB adenocarcinoma of the right upper lobe status post wedge resection on 03/13/2014. T2aN1M0 (stage IB).  She is s/p wedge resection of a left lower lobe mass on 03/10/2017. T1aNx (stage IA). No with locally recurrent NSCLC   Malignant neoplasm of lower lobe of left lung (HCC) #History of stage Ib right lung upper lobe adenocarcinoma status post wedge resection in 2016 History of left lower lobe adenocarcinoma T1 a NX status post wedge resection 2019. Recurrence - Biopsy of pretracheal lymph node positive for NSCLC.  MRI brain is negative.  Recommend concurrent chemotherapy [carboplatin  taxol ]  with radiation.  Labs are reviewed and discussed with patient. S/p concurrent carboplatin  AUC2, taxol  45mg /m with Radiation.    Recommend immunotherapy consolidation with Durvalumab  Q2weeks for up to a year.  Labs are reviewed and discussed with patient. Oct 2025 CT showed treatment response.  Proceed with Durvalumab    Encounter for antineoplastic immunotherapy Immunotherapy plan as listed above.  Primary cancer of right upper lobe of lung [facility]) Same plan as above.    Orders Placed This Encounter  Procedures   CBC with Differential (Cancer Center Only)    Standing Status:   Future    Expected Date:   02/07/2024    Expiration Date:   02/06/2025   CMP (Cancer Center only)    Standing Status:   Future    Expected Date:   02/07/2024    Expiration Date:   02/06/2025   T4    Standing Status:   Future    Expected Date:   02/07/2024    Expiration Date:   02/06/2025   TSH    Standing Status:   Future    Expected Date:   02/07/2024    Expiration Date:   02/06/2025   Follow up per LOS. All questions were answered. The patient knows to call the clinic with any problems,  questions or concerns.  We spent sufficient time to discuss many aspect of care, questions were answered to patient's satisfaction.   [name], MD, PhD [facility] Hematology Oncology 01/10/2024   PERTINENT ONCOLOGY HISTORY Previously follows up with Dr.Corcoran. Estasblish care with me on 03/15/2018  Oncology History  Primary cancer of right upper lobe of lung (HCC)  03/13/2014 Initial Diagnosis   Primary cancer of right upper lobe of lung   PET scan on 02/13/2014 revealed a 1 cm right upper lobe hypermetabolic nodule (SUV 3.8) and a 6 mm right upper lobe nodule (no metabolic activity). There was no mediastianl adenopathy.  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 Pathology revealed a 1.1 cm high-grade adenocarcinoma with pleural invasion. Nodes were negative. Pathologic stage was T2aN1M0 (stage IB).     03/30/2023 Imaging   PET  1. Hypermetabolic pretracheal lymph node is concerning for metastatic adenopathy. 2. No evidence of local recurrence in the RIGHT upper lobe. 3. No evidence of metastatic disease in the abdomen pelvis. 4. Small nodule along the LEFT oblique fissure has metabolic activity; however, nodule has been stable in size since 03/16/2021. Favor benign nodule. 5. LEFT hydronephrosis with double-J ureteral stent in place.   05/16/2023 - 06/27/2023 Chemotherapy   Patient is on Treatment Plan : Carboplatin  + Paclitaxel  Weekly X 6 Weeks with XRT  07/31/2023 Imaging   CT chest wo contrast   1. Interval decrease in size of the precarinal lymph node seen on prior studies. This lymph node is seen to be hypermetabolic on PET-CT 03/30/2023. Close continued attention warranted. 2. Stable 8 mm retro hilar left lung nodule. Additional scattered smaller pulmonary nodules bilaterally are unchanged. 3. Stable focus of architectural distortion in the medial left upper lobe with a 5 mm nodular component. 4. Aortic Atherosclerosis  (ICD10-I70.0) and Emphysema (ICD10-J43.9).   08/04/2023 -  Chemotherapy   Patient is on Treatment Plan : LUNG Durvalumab  (10) q14d     Malignant neoplasm of lower lobe of left lung (HCC)  03/10/2017 Initial Diagnosis   Malignant neoplasm of lower lobe of left lung   Chest CT on 02/10/2017 revealed clear interval progression of the posterior left lower lobe pulmonary nodule, now measuring 12 mm in long axis and having a distinctly lobular contour. Imaging features were very concerning for neoplasm.  There was no change in the 7 mm nodule posterior to the right hilum.   PET scan on 02/18/2017 revealed a 11 x 8 mm posterior left lower lobe pulmonary nodule that had mildly progressed from prior studies and demonstrated mild hypermetabolism (SUV 1.6).  The appearance was considered worrisome for an indolent primary bronchogenic neoplasm.   03/10/2017.  s/p wedge resection of a left lower lobe mass.   Pathology revealed an 8 mm invasive adenocarcinoma with solid and acinar patterns.  There was no visceral invasion.  There was no lymphovascular invasion.  All margins were negative.  Pathologic stage was T1aNx (stage IA).   03/30/2023 Imaging   PET  1. Hypermetabolic pretracheal lymph node is concerning for metastatic adenopathy. 2. No evidence of local recurrence in the RIGHT upper lobe. 3. No evidence of metastatic disease in the abdomen pelvis. 4. Small nodule along the LEFT oblique fissure has metabolic activity; however, nodule has been stable in size since 03/16/2021. Favor benign nodule. 5. LEFT hydronephrosis with double-J ureteral stent in place.   04/27/2023 Cancer Staging   Staging form: Lung, AJCC 8th Edition - Pathologic stage from 04/27/2023: pN2, cM0 - Signed by Babara Call, MD on 04/27/2023 Stage prefix: Recurrence   05/16/2023 - 06/27/2023 Chemotherapy   Patient is on Treatment Plan : Carboplatin  + Paclitaxel  Weekly X 6 Weeks with XRT     07/31/2023 Imaging   CT chest wo contrast   1.  Interval decrease in size of the precarinal lymph node seen on prior studies. This lymph node is seen to be hypermetabolic on PET-CT 03/30/2023. Close continued attention warranted. 2. Stable 8 mm retro hilar left lung nodule. Additional scattered smaller pulmonary nodules bilaterally are unchanged. 3. Stable focus of architectural distortion in the medial left upper lobe with a 5 mm nodular component. 4. Aortic Atherosclerosis (ICD10-I70.0) and Emphysema (ICD10-J43.9).   08/04/2023 -  Chemotherapy   Patient is on Treatment Plan : LUNG Durvalumab  (10) q14d       INTERVAL HISTORY Heather [name] is a 88 y.o. female who has above history reviewed by me today presents for follow up visit for management of recurrent lung cancer, .  Patient reports feeling well.  She was accompanied by her husband Denies shortness of breath, fatigue, hemoptysis, unintentional weight loss.Denies headache, nausea vomiting, focal weakness Right forearm skin cancer lesion-follow-up with dermatology.  She has recently had tooth extraction. Status post cystoscopy for management of chronic left UPJ obstruction with stent exchange..  Past Medical History:  Diagnosis Date  Anginal pain    Benign essential hypertension    Brain aneurysm    Carotid artery stenosis    Carotid stenosis 06/19/2013   Colonic polyp    Dyspnea    Emphysema lung (HCC)    GERD (gastroesophageal reflux disease)    Hiatal hernia    Hypercholesteremia    Hyperlipidemia, mixed 06/09/2015   Lung cancer (HCC) 2016   Lung nodule 06/21/2014   Macrocytic 10/06/2014   Multiple thyroid  nodules    Obstruction of left ureteropelvic junction (UPJ)    PAT (paroxysmal atrial tachycardia) 06/19/2013   Plantar fasciitis     Past Surgical History:  Procedure Laterality Date   BREAST EXCISIONAL BIOPSY Right 40 yrs ago   neg   BREAST LUMPECTOMY     benign   CATARACT EXTRACTION W/ INTRAOCULAR LENS  IMPLANT, BILATERAL Bilateral    CEREBRAL  ANEURYSM REPAIR  2004   coil treatment   CERVIX LESION DESTRUCTION     COLONOSCOPY     1995, 2006, 2014   COLONOSCOPY WITH PROPOFOL  N/A 03/31/2015   Procedure: COLONOSCOPY WITH PROPOFOL ;  Surgeon: Lamar ONEIDA Holmes, MD;  Location: [facility] ENDOSCOPY;  Service: Endoscopy;  Laterality: N/A;   CYSTOSCOPY W/ RETROGRADES Left 03/02/2022   Procedure: CYSTOSCOPY WITH RETROGRADE PYELOGRAM;  Surgeon: Twylla Glendia BROCKS, MD;  Location: [facility] ORS;  Service: Urology;  Laterality: Left;   CYSTOSCOPY W/ RETROGRADES Left 12/28/2022   Procedure: CYSTOSCOPY WITH RETROGRADE PYELOGRAM;  Surgeon: Twylla Glendia BROCKS, MD;  Location: [facility] ORS;  Service: Urology;  Laterality: Left;   CYSTOSCOPY W/ RETROGRADES Left 12/29/2023   Procedure: CYSTOSCOPY, WITH RETROGRADE PYELOGRAM;  Surgeon: Twylla Glendia BROCKS, MD;  Location: [facility] ORS;  Service: Urology;  Laterality: Left;   CYSTOSCOPY W/ URETERAL STENT PLACEMENT Left 03/03/2021   Procedure: CYSTOSCOPY WITH EXCHANGE;  Surgeon: Twylla Glendia BROCKS, MD;  Location: [facility] ORS;  Service: Urology;  Laterality: Left;   CYSTOSCOPY W/ URETERAL STENT PLACEMENT Left 03/02/2022   Procedure: CYSTOSCOPY WITH STENT EXCHANGE;  Surgeon: Twylla Glendia BROCKS, MD;  Location: [facility] ORS;  Service: Urology;  Laterality: Left;   CYSTOSCOPY W/ URETERAL STENT PLACEMENT Left 12/28/2022   Procedure: CYSTOSCOPY WITH STENT EXCHANGE;  Surgeon: Twylla Glendia BROCKS, MD;  Location: [facility] ORS;  Service: Urology;  Laterality: Left;   CYSTOSCOPY W/ URETERAL STENT PLACEMENT Left 12/29/2023   Procedure: CYSTOSCOPY, FLEXIBLE, WITH STENT REPLACEMENT;  Surgeon: Twylla Glendia BROCKS, MD;  Location: [facility] ORS;  Service: Urology;  Laterality: Left;   CYSTOSCOPY WITH STENT PLACEMENT Left 11/25/2020   Procedure: CYSTOSCOPY WITH STENT PLACEMENT;  Surgeon: Twylla Glendia BROCKS, MD;  Location: [facility] ORS;  Service: Urology;  Laterality: Left;   ENDOBRONCHIAL ULTRASOUND Bilateral 04/19/2023   Procedure: ENDOBRONCHIAL ULTRASOUND (EBUS);  Surgeon: Isadora Hose, MD;  Location: [facility] ORS;  Service: Pulmonary;  Laterality: Bilateral;   FLEXIBLE BRONCHOSCOPY N/A 03/10/2017   Procedure: FLEXIBLE BRONCHOSCOPY;  Surgeon: Volney Lye, MD;  Location: [facility] ORS;  Service: Thoracic;  Laterality: N/A;   THORACOSCOPY WITH WEDGE RESECTION LUNG Right 03/13/2014   RUL   THORACOTOMY/LOBECTOMY Left 03/10/2017   Procedure: THORACOTOMY WITH LUNG WEDGE RESECTION POSSIBLE LOBECTOMY;  Surgeon: Volney Lye, MD;  Location: [facility] ORS;  Service: Thoracic;  Laterality: Left;   TUBAL LIGATION      Family History  Problem Relation Age of Onset   Alcohol abuse Mother    Heart attack Father    Breast cancer Neg Hx     Social History:  reports that she quit smoking about 35 years ago.  Her smoking use included cigarettes. She started smoking about 70 years ago. She has a 35 pack-year smoking history. She has never used smokeless tobacco. She reports current alcohol use. She reports that she does not use drugs.   Allergies:  Allergies  Allergen Reactions   Ace Inhibitors Swelling   Contrast Media [Iodinated Contrast Media]    Cortisone     Patient had facial swelling and itching from cortisone injection IM    Current Medications: Current Outpatient Medications  Medication Sig Dispense Refill   amLODipine  (NORVASC ) 5 MG tablet Take 5 mg by mouth at bedtime.     atenolol  (TENORMIN ) 50 MG tablet Take 50 mg by mouth in the morning.     cetirizine (ZYRTEC) 10 MG tablet Take 10 mg by mouth daily.     cholecalciferol  (VITAMIN D3) 25 MCG (1000 UNIT) tablet Take 1,000 Units by mouth daily.     dextromethorphan-guaiFENesin  (MUCINEX  DM) 30-600 MG 12hr tablet Take 1 tablet by mouth 2 (two) times daily as needed for cough.     donepezil  (ARICEPT ) 10 MG tablet Take 10 mg by mouth at bedtime.     fluticasone  (FLONASE ) 50 MCG/ACT nasal spray Place 1 spray into the nose daily as needed for allergies.      simvastatin  (ZOCOR ) 20 MG tablet Take 20 mg by mouth at bedtime.      trospium  (SANCTURA ) 20 MG tablet Take 1 tablet (20 mg total) by mouth 2 (two) times daily as needed (frequency,urgency,bladder spasm). 20 tablet 0   vitamin B-12 (CYANOCOBALAMIN ) 100 MCG tablet Take 100 mcg by mouth daily.     No current facility-administered medications for this visit.   Review of Systems  Constitutional:  Negative for appetite change, chills, fatigue and fever.  HENT:   Positive for hearing loss. Negative for voice change.   Eyes:  Negative for eye problems.  Respiratory:  Negative for chest tightness and cough.   Cardiovascular:  Negative for chest pain.  Gastrointestinal:  Negative for abdominal distention, abdominal pain, blood in stool and nausea.  Endocrine: Negative for hot flashes.  Genitourinary:  Negative for difficulty urinating and frequency.   Musculoskeletal:  Negative for arthralgias.       Chronic intermittent back pain  Skin:  Negative for itching and rash.  Neurological:  Negative for extremity weakness.  Hematological:  Negative for adenopathy.  Psychiatric/Behavioral:  Negative for confusion.     Performance status (ECOG): 0 - Asymptomatic  Vital Signs BP (!) 158/77 Comment: Rechecked BP. Patient is aware of high BP readings and will discuss with PCP.  Pulse 61   Temp (!) 96.5 F (35.8 C) (Tympanic)   Resp 18   Wt 132 lb 9.6 oz (60.1 kg)   SpO2 99%   BMI 24.25 kg/m   Physical Exam Constitutional:      General: She is not in acute distress.    Appearance: She is not diaphoretic.  HENT:     Head: Normocephalic and atraumatic.  Eyes:     General: No scleral icterus. Cardiovascular:     Rate and Rhythm: Normal rate and regular rhythm.  Pulmonary:     Effort: Pulmonary effort is normal. No respiratory distress.  Abdominal:     General: There is no distension.     Palpations: Abdomen is soft.     Tenderness: There is no abdominal tenderness.  Musculoskeletal:        General: Normal range of motion.     Cervical back: Normal range of  motion and neck supple.  Skin:    General: Skin is dry.     Findings: Lesion present. No erythema.  Neurological:     Mental Status: She is alert and oriented to person, place, and time. Mental status is at baseline.  Psychiatric:        Mood and Affect: Mood and affect normal.     RADIOGRAPHIC STUDIES: I have personally reviewed the radiological images as listed and agreed with the findings in the report. DG OR UROLOGY CYSTO IMAGE ([facility] ONLY) Result Date: 12/29/2023 There is no interpretation for this exam.  This order is for images obtained during a surgical procedure.  Please See Surgeries Tab for more information regarding the procedure.   CT Chest Wo Contrast Result Date: 11/27/2023 CLINICAL DATA:  History of right upper lobe adenocarcinoma status post wedge resection 03/13/2014 and left lower lobe adenocarcinoma status post wedge resection 03/10/2017 with locally recurrent non-small-cell lung cancer status post chemoradiation, now on immunotherapy. * Tracking Code: BO * EXAM: CT CHEST WITHOUT CONTRAST TECHNIQUE: Multidetector CT imaging of the chest was performed following the standard protocol without IV contrast. RADIATION DOSE REDUCTION: This exam was performed according to the departmental dose-optimization program which includes automated exposure control, adjustment of the mA and/or kV according to patient size and/or use of iterative reconstruction technique. COMPARISON:  CT chest dated 07/27/2023 FINDINGS: Cardiovascular: Normal heart size. No significant pericardial fluid/thickening. Great vessels are normal in course and caliber. Coronary artery calcifications and aortic atherosclerosis. Mediastinum/Nodes: Imaged thyroid  gland without nodules meeting criteria for imaging follow-up by size. Small hiatal hernia. Decreased size of previously hypermetabolic precarinal lymph node measuring 5 mm (2:57), previously 10 mm. New lymphadenopathy. Lungs/Pleura: The central airways are patent.  Postsurgical changes of the right upper and left lower lobes. Biapical pleural-parenchymal scarring. Mild-to-moderate centrilobular and paraseptal emphysema. Unchanged architectural distortion in the medial left upper lobe (4:33). Scattered pulmonary nodules are also unchanged: -8 x 6 mm posteromedial right upper lobe (4:58) -6 mm perifissural left upper lobe (4:85) -3 mm anterior right middle lobe (4:95) -8 x 6 mm basilar left lower lobe (4:124) Unchanged posterior right lower lobe ground-glass nodule measures 1.7 x 1.4 cm (4:86). No pneumothorax. No pleural effusion. Upper abdomen: Unchanged hepatic hypodensities, likely cysts. Musculoskeletal: No acute or abnormal lytic or blastic osseous lesions. Multilevel degenerative changes of the thoracic spine. IMPRESSION: 1. Continued decrease in size of previously hypermetabolic precarinal lymph node. No new lymphadenopathy. 2. Unchanged bilateral solid pulmonary nodules and right lower lobe ground-glass nodule. 3. Aortic Atherosclerosis (ICD10-I70.0) and Emphysema (ICD10-J43.9). Coronary artery calcifications. Assessment for potential risk factor modification, dietary therapy or pharmacologic therapy may be warranted, if clinically indicated. Electronically Signed   By: Limin  Xu M.D.   On: 11/27/2023 12:09    Laboratory results were reviewed by me    Latest Ref Rng & Units 01/10/2024   10:27 AM 12/27/2023   10:03 AM 12/08/2023    8:10 AM  CBC  WBC 4.0 - 10.5 K/uL 5.8  5.4  7.8   Hemoglobin 12.0 - 15.0 g/dL 86.2  85.7  84.9   Hematocrit 36.0 - 46.0 % 41.9  42.2  45.4   Platelets 150 - 400 K/uL 175  157  162       Latest Ref Rng & Units 01/10/2024   10:26 AM 12/27/2023   10:03 AM 12/08/2023    8:10 AM  CMP  Glucose 70 - 99 mg/dL 91  888  894   BUN 8 -  23 mg/dL 18  19  23    Creatinine 0.44 - 1.00 mg/dL 9.18  9.15  9.19   Sodium 135 - 145 mmol/L 144  140  140   Potassium 3.5 - 5.1 mmol/L 4.2  4.0  4.4   Chloride 98 - 111 mmol/L 107  104  104   CO2  22 - 32 mmol/L 26  27  27    Calcium  8.9 - 10.3 mg/dL 9.4  9.0  9.2   Total Protein 6.5 - 8.1 g/dL 6.5  6.5  6.6   Total Bilirubin 0.0 - 1.2 mg/dL 0.4  0.7  0.6   Alkaline Phos 38 - 126 U/L 52  47  52   AST 15 - 41 U/L 23  27  19    ALT 0 - 44 U/L 16  27  23

## 2024-01-10 NOTE — Assessment & Plan Note (Signed)
#  History of stage Ib right lung upper lobe adenocarcinoma status post wedge resection in 2016 History of left lower lobe adenocarcinoma T1 a NX status post wedge resection 2019. Recurrence - Biopsy of pretracheal lymph node positive for NSCLC.  MRI brain is negative.  Recommend concurrent chemotherapy [carboplatin  taxol ]  with radiation.  Labs are reviewed and discussed with patient. S/p concurrent carboplatin  AUC2, taxol  45mg /m with Radiation.    Recommend immunotherapy consolidation with Durvalumab  Q2weeks for up to a year.  Labs are reviewed and discussed with patient. Oct 2025 CT showed treatment response.  Proceed with Durvalumab

## 2024-01-12 ENCOUNTER — Inpatient Hospital Stay

## 2024-01-12 ENCOUNTER — Inpatient Hospital Stay: Admitting: Oncology

## 2024-01-12 ENCOUNTER — Encounter: Payer: Self-pay | Admitting: Urology

## 2024-01-12 NOTE — Anesthesia Postprocedure Evaluation (Signed)
 Anesthesia Post Note  Patient: Heather [name]  Procedure(s) Performed: CYSTOSCOPY, FLEXIBLE, WITH STENT REPLACEMENT (Left: Ureter) CYSTOSCOPY, WITH RETROGRADE PYELOGRAM (Left: Bladder)  Patient location during evaluation: PACU Anesthesia Type: MAC Level of consciousness: awake and alert Pain management: pain level controlled Vital Signs Assessment: post-procedure vital signs reviewed and stable Respiratory status: spontaneous breathing, nonlabored ventilation, respiratory function stable and patient connected to nasal cannula oxygen Cardiovascular status: blood pressure returned to baseline and stable Postop Assessment: no apparent nausea or vomiting Anesthetic complications: no   No notable events documented.   Last Vitals:  Vitals:   12/29/23 1040 12/29/23 1047  BP:  (!) 167/63  Pulse: (!) 58   Resp: 16   Temp: 36.9 C   SpO2: 96%     Last Pain:  Vitals:   12/29/23 1040  TempSrc: Temporal  PainSc: 0-No pain                 Lynwood KANDICE Clause

## 2024-01-24 ENCOUNTER — Inpatient Hospital Stay

## 2024-01-24 ENCOUNTER — Inpatient Hospital Stay (HOSPITAL_BASED_OUTPATIENT_CLINIC_OR_DEPARTMENT_OTHER): Admitting: Oncology

## 2024-01-24 ENCOUNTER — Encounter: Payer: Self-pay | Admitting: Oncology

## 2024-01-24 VITALS — BP 128/60 | HR 64

## 2024-01-24 VITALS — BP 178/72 | HR 58 | Temp 97.3°F | Ht 62.0 in | Wt 129.0 lb

## 2024-01-24 DIAGNOSIS — C3411 Malignant neoplasm of upper lobe, right bronchus or lung: Secondary | ICD-10-CM

## 2024-01-24 DIAGNOSIS — C3432 Malignant neoplasm of lower lobe, left bronchus or lung: Secondary | ICD-10-CM

## 2024-01-24 DIAGNOSIS — Z5112 Encounter for antineoplastic immunotherapy: Secondary | ICD-10-CM

## 2024-01-24 DIAGNOSIS — C770 Secondary and unspecified malignant neoplasm of lymph nodes of head, face and neck: Secondary | ICD-10-CM | POA: Diagnosis not present

## 2024-01-24 LAB — CBC WITH DIFFERENTIAL (CANCER CENTER ONLY)
Abs Immature Granulocytes: 0.03 K/uL (ref 0.00–0.07)
Basophils Absolute: 0 K/uL (ref 0.0–0.1)
Basophils Relative: 0 %
Eosinophils Absolute: 0.1 K/uL (ref 0.0–0.5)
Eosinophils Relative: 1 %
HCT: 43.5 % (ref 36.0–46.0)
Hemoglobin: 14.3 g/dL (ref 12.0–15.0)
Immature Granulocytes: 1 %
Lymphocytes Relative: 20 %
Lymphs Abs: 1.1 K/uL (ref 0.7–4.0)
MCH: 32.9 pg (ref 26.0–34.0)
MCHC: 32.9 g/dL (ref 30.0–36.0)
MCV: 100 fL (ref 80.0–100.0)
Monocytes Absolute: 0.5 K/uL (ref 0.1–1.0)
Monocytes Relative: 10 %
Neutro Abs: 3.8 K/uL (ref 1.7–7.7)
Neutrophils Relative %: 68 %
Platelet Count: 166 K/uL (ref 150–400)
RBC: 4.35 MIL/uL (ref 3.87–5.11)
RDW: 14.6 % (ref 11.5–15.5)
WBC Count: 5.5 K/uL (ref 4.0–10.5)
nRBC: 0 % (ref 0.0–0.2)

## 2024-01-24 LAB — CMP (CANCER CENTER ONLY)
ALT: 14 U/L (ref 0–44)
AST: 22 U/L (ref 15–41)
Albumin: 4.1 g/dL (ref 3.5–5.0)
Alkaline Phosphatase: 54 U/L (ref 38–126)
Anion gap: 11 (ref 5–15)
BUN: 15 mg/dL (ref 8–23)
CO2: 25 mmol/L (ref 22–32)
Calcium: 9.2 mg/dL (ref 8.9–10.3)
Chloride: 104 mmol/L (ref 98–111)
Creatinine: 0.82 mg/dL (ref 0.44–1.00)
GFR, Estimated: >60 mL/min
Glucose, Bld: 91 mg/dL (ref 70–99)
Potassium: 4.3 mmol/L (ref 3.5–5.1)
Sodium: 141 mmol/L (ref 135–145)
Total Bilirubin: 0.5 mg/dL (ref 0.0–1.2)
Total Protein: 6.3 g/dL — ABNORMAL LOW (ref 6.5–8.1)

## 2024-01-24 MED ORDER — SODIUM CHLORIDE 0.9 % IV SOLN
10.0000 mg/kg | Freq: Once | INTRAVENOUS | Status: AC
  Administered 2024-01-24: 500 mg via INTRAVENOUS
  Filled 2024-01-24: qty 10

## 2024-01-24 MED ORDER — SODIUM CHLORIDE 0.9 % IV SOLN
INTRAVENOUS | Status: DC
  Filled 2024-01-24: qty 250

## 2024-01-24 NOTE — Assessment & Plan Note (Signed)
 Immunotherapy plan as listed above

## 2024-01-24 NOTE — Progress Notes (Signed)
 Hematology/Oncology Progress note [phone] [phone]     Chief Complaint:  Follow up for recurrent non small cell lung cancer   ASSESSMENT & PLAN:  UNNAMED HINO is a 88 y.o. Heather [name] female with stage IB adenocarcinoma of the right upper lobe status post wedge resection on 03/13/2014. T2aN1M0 (stage IB).  She is s/p wedge resection of a left lower lobe mass on 03/10/2017. T1aNx (stage IA). No with locally recurrent NSCLC   Malignant neoplasm of lower lobe of left lung (HCC) #History of stage Ib right lung upper lobe adenocarcinoma status post wedge resection in 2016 History of left lower lobe adenocarcinoma T1 a NX status post wedge resection 2019. Recurrence - Biopsy of pretracheal lymph node positive for NSCLC.  MRI brain is negative.  Recommend concurrent chemotherapy [carboplatin  taxol ]  with radiation.  Labs are reviewed and discussed with patient. S/p concurrent carboplatin  AUC2, taxol  45mg /m with Radiation.    Recommend immunotherapy consolidation with Durvalumab  Q2weeks for up to a year.  Labs are reviewed and discussed with patient. Oct 2025 CT showed treatment response.  Proceed with Durvalumab    Encounter for antineoplastic immunotherapy Immunotherapy plan as listed above.  Primary cancer of right upper lobe of lung Heather [name]) Same plan as above.    Orders Placed This Encounter  Procedures   CBC with Differential (Cancer Center Only)    Standing Status:   Future    Expected Date:   02/22/2024    Expiration Date:   02/21/2025   CMP (Cancer Center only)    Standing Status:   Future    Expected Date:   02/22/2024    Expiration Date:   02/21/2025   Follow up per LOS. All questions were answered. The patient knows to call the clinic with any problems, questions or concerns.  We spent sufficient time to discuss many aspect of care, questions were answered to patient's satisfaction.   [name], MD, PhD [facility] Hematology Oncology 01/24/2024    PERTINENT ONCOLOGY HISTORY Previously follows up with Dr.Corcoran. Estasblish care with me on 03/15/2018  Oncology History  Primary cancer of right upper lobe of lung (HCC)  03/13/2014 Initial Diagnosis   Primary cancer of right upper lobe of lung   PET scan on 02/13/2014 revealed a 1 cm right upper lobe hypermetabolic nodule (SUV 3.8) and a 6 mm right upper lobe nodule (no metabolic activity). There was no mediastianl adenopathy.  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 Pathology revealed a 1.1 cm high-grade adenocarcinoma with pleural invasion. Nodes were negative. Pathologic stage was T2aN1M0 (stage IB).     03/30/2023 Imaging   PET  1. Hypermetabolic pretracheal lymph node is concerning for metastatic adenopathy. 2. No evidence of local recurrence in the RIGHT upper lobe. 3. No evidence of metastatic disease in the abdomen pelvis. 4. Small nodule along the LEFT oblique fissure has metabolic activity; however, nodule has been stable in size since 03/16/2021. Favor benign nodule. 5. LEFT hydronephrosis with double-J ureteral stent in place.   05/16/2023 - 06/27/2023 Chemotherapy   Patient is on Treatment Plan : Carboplatin  + Paclitaxel  Weekly X 6 Weeks with XRT     07/31/2023 Imaging   CT chest wo contrast   1. Interval decrease in size of the precarinal lymph node seen on prior studies. This lymph node is seen to be hypermetabolic on PET-CT 03/30/2023. Close continued attention warranted. 2. Stable 8 mm retro hilar left lung nodule. Additional scattered smaller pulmonary  nodules bilaterally are unchanged. 3. Stable focus of architectural distortion in the medial left upper lobe with a 5 mm nodular component. 4. Aortic Atherosclerosis (ICD10-I70.0) and Emphysema (ICD10-J43.9).   08/04/2023 -  Chemotherapy   Patient is on Treatment Plan : LUNG Durvalumab  (10) q14d     Malignant neoplasm of lower lobe of left lung (HCC)  03/10/2017  Initial Diagnosis   Malignant neoplasm of lower lobe of left lung   Chest CT on 02/10/2017 revealed clear interval progression of the posterior left lower lobe pulmonary nodule, now measuring 12 mm in long axis and having a distinctly lobular contour. Imaging features were very concerning for neoplasm.  There was no change in the 7 mm nodule posterior to the right hilum.   PET scan on 02/18/2017 revealed a 11 x 8 mm posterior left lower lobe pulmonary nodule that had mildly progressed from prior studies and demonstrated mild hypermetabolism (SUV 1.6).  The appearance was considered worrisome for an indolent primary bronchogenic neoplasm.   03/10/2017.  s/p wedge resection of a left lower lobe mass.   Pathology revealed an 8 mm invasive adenocarcinoma with solid and acinar patterns.  There was no visceral invasion.  There was no lymphovascular invasion.  All margins were negative.  Pathologic stage was T1aNx (stage IA).   03/30/2023 Imaging   PET  1. Hypermetabolic pretracheal lymph node is concerning for metastatic adenopathy. 2. No evidence of local recurrence in the RIGHT upper lobe. 3. No evidence of metastatic disease in the abdomen pelvis. 4. Small nodule along the LEFT oblique fissure has metabolic activity; however, nodule has been stable in size since 03/16/2021. Favor benign nodule. 5. LEFT hydronephrosis with double-J ureteral stent in place.   04/27/2023 Cancer Staging   Staging form: Lung, AJCC 8th Edition - Pathologic stage from 04/27/2023: pN2, cM0 - Signed by Babara Call, MD on 04/27/2023 Stage prefix: Recurrence   05/16/2023 - 06/27/2023 Chemotherapy   Patient is on Treatment Plan : Carboplatin  + Paclitaxel  Weekly X 6 Weeks with XRT     07/31/2023 Imaging   CT chest wo contrast   1. Interval decrease in size of the precarinal lymph node seen on prior studies. This lymph node is seen to be hypermetabolic on PET-CT 03/30/2023. Close continued attention warranted. 2. Stable 8 mm retro  hilar left lung nodule. Additional scattered smaller pulmonary nodules bilaterally are unchanged. 3. Stable focus of architectural distortion in the medial left upper lobe with a 5 mm nodular component. 4. Aortic Atherosclerosis (ICD10-I70.0) and Emphysema (ICD10-J43.9).   08/04/2023 -  Chemotherapy   Patient is on Treatment Plan : LUNG Durvalumab  (10) q14d      Status post cystoscopy for management of chronic left UPJ obstruction with stent exchange..  INTERVAL HISTORY Heather [name] is a 88 y.o. female who has above history reviewed by me today presents for follow up visit for management of recurrent lung cancer, .  Patient reports feeling well.  She was accompanied by her husband Denies shortness of breath, fatigue, hemoptysis, unintentional weight loss.Denies headache, nausea vomiting, focal weakness    Past Medical History:  Diagnosis Date   Anginal pain    Benign essential hypertension    Brain aneurysm    Carotid artery stenosis    Carotid stenosis 06/19/2013   Colonic polyp    Dyspnea    Emphysema lung (HCC)    GERD (gastroesophageal reflux disease)    Hiatal hernia    Hypercholesteremia    Hyperlipidemia, mixed 06/09/2015  Lung cancer (HCC) 2016   Lung nodule 06/21/2014   Macrocytic 10/06/2014   Multiple thyroid  nodules    Obstruction of left ureteropelvic junction (UPJ)    PAT (paroxysmal atrial tachycardia) 06/19/2013   Plantar fasciitis     Past Surgical History:  Procedure Laterality Date   BREAST EXCISIONAL BIOPSY Right 40 yrs ago   neg   BREAST LUMPECTOMY     benign   CATARACT EXTRACTION W/ INTRAOCULAR LENS  IMPLANT, BILATERAL Bilateral    CEREBRAL ANEURYSM REPAIR  2004   coil treatment   CERVIX LESION DESTRUCTION     COLONOSCOPY     1995, 2006, 2014   COLONOSCOPY WITH PROPOFOL  N/A 03/31/2015   Procedure: COLONOSCOPY WITH PROPOFOL ;  Surgeon: Lamar ONEIDA Holmes, MD;  Location: [facility] ENDOSCOPY;  Service: Endoscopy;  Laterality: N/A;   CYSTOSCOPY W/  RETROGRADES Left 03/02/2022   Procedure: CYSTOSCOPY WITH RETROGRADE PYELOGRAM;  Surgeon: Twylla Glendia BROCKS, MD;  Location: [facility] ORS;  Service: Urology;  Laterality: Left;   CYSTOSCOPY W/ RETROGRADES Left 12/28/2022   Procedure: CYSTOSCOPY WITH RETROGRADE PYELOGRAM;  Surgeon: Twylla Glendia BROCKS, MD;  Location: [facility] ORS;  Service: Urology;  Laterality: Left;   CYSTOSCOPY W/ RETROGRADES Left 12/29/2023   Procedure: CYSTOSCOPY, WITH RETROGRADE PYELOGRAM;  Surgeon: Twylla Glendia BROCKS, MD;  Location: [facility];  Service: Urology;  Laterality: Left;   CYSTOSCOPY W/ URETERAL STENT PLACEMENT Left 03/03/2021   Procedure: CYSTOSCOPY WITH EXCHANGE;  Surgeon: Twylla Glendia BROCKS, MD;  Location: [facility]S;  Service: Urology;  Laterality: Left;   CYSTOSCOPY W/ URETERAL STENT PLACEMENT Left 03/02/2022   Procedure: CYSTOSCOPY WITH STENT EXCHANGE;  Surgeon: Twylla Glendia BROCKS, MD;  Location: [facility];  Service: Urology;  Laterality: Left;   CYSTOSCOPY W/ URETERAL STENT PLACEMENT Left 12/28/2022   Procedure: CYSTOSCOPY WITH STENT EXCHANGE;  Surgeon: Twylla Glendia BROCKS, MD;  Location: [facility];  Service: Urology;  Laterality: Left;   CYSTOSCOPY W/ URETERAL STENT PLACEMENT Left 12/29/2023   Procedure: CYSTOSCOPY, FLEXIBLE, WITH STENT REPLACEMENT;  Surgeon: Twylla Glendia BROCKS, MD;  Location: [facility];  Service: Urology;  Laterality: Left;   CYSTOSCOPY WITH STENT PLACEMENT Left 11/25/2020   Procedure: CYSTOSCOPY WITH STENT PLACEMENT;  Surgeon: Twylla Glendia BROCKS, MD;  Location: [facility];  Service: Urology;  Laterality: Left;   ENDOBRONCHIAL ULTRASOUND Bilateral 04/19/2023   Procedure: ENDOBRONCHIAL ULTRASOUND (EBUS);  Surgeon: Isadora Hose, MD;  Location: [facility];  Service: Pulmonary;  Laterality: Bilateral;   FLEXIBLE BRONCHOSCOPY N/A 03/10/2017   Procedure: FLEXIBLE BRONCHOSCOPY;  Surgeon: Volney Lye, MD;  Location: [facility];  Service: Thoracic;  Laterality: N/A;   THORACOSCOPY WITH WEDGE RESECTION LUNG Right 03/13/2014   RUL    THORACOTOMY/LOBECTOMY Left 03/10/2017   Procedure: THORACOTOMY WITH LUNG WEDGE RESECTION POSSIBLE LOBECTOMY;  Surgeon: Volney Lye, MD;  Location: [facility] ORS;  Service: Thoracic;  Laterality: Left;   TUBAL LIGATION      Family History  Problem Relation Age of Onset   Alcohol abuse Mother    Heart attack Father    Breast cancer Neg Hx     Social History:  reports that she quit smoking about 35 years ago. Her smoking use included cigarettes. She started smoking about 70 years ago. She has a 35 pack-year smoking history. She has never used smokeless tobacco. She reports current alcohol use. She reports that she does not use drugs.   Allergies:  Allergies  Allergen Reactions   Ace Inhibitors Swelling   Contrast Media [Iodinated Contrast Media]    Cortisone  Patient had facial swelling and itching from cortisone injection IM    Current Medications: Current Outpatient Medications  Medication Sig Dispense Refill   amLODipine  (NORVASC ) 5 MG tablet Take 5 mg by mouth at bedtime.     atenolol  (TENORMIN ) 50 MG tablet Take 50 mg by mouth in the morning.     cetirizine (ZYRTEC) 10 MG tablet Take 10 mg by mouth daily.     cholecalciferol  (VITAMIN D3) 25 MCG (1000 UNIT) tablet Take 1,000 Units by mouth daily.     dextromethorphan-guaiFENesin  (MUCINEX  DM) 30-600 MG 12hr tablet Take 1 tablet by mouth 2 (two) times daily as needed for cough.     donepezil  (ARICEPT ) 10 MG tablet Take 10 mg by mouth at bedtime.     fluticasone  (FLONASE ) 50 MCG/ACT nasal spray Place 1 spray into the nose daily as needed for allergies.      simvastatin  (ZOCOR ) 20 MG tablet Take 20 mg by mouth at bedtime.     trospium  (SANCTURA ) 20 MG tablet Take 1 tablet (20 mg total) by mouth 2 (two) times daily as needed (frequency,urgency,bladder spasm). 20 tablet 0   vitamin B-12 (CYANOCOBALAMIN ) 100 MCG tablet Take 100 mcg by mouth daily.     No current facility-administered medications for this visit.   Review of Systems   Constitutional:  Negative for appetite change, chills, fatigue and fever.  HENT:   Positive for hearing loss. Negative for voice change.   Eyes:  Negative for eye problems.  Respiratory:  Negative for chest tightness and cough.   Cardiovascular:  Negative for chest pain.  Gastrointestinal:  Negative for abdominal distention, abdominal pain, blood in stool and nausea.  Endocrine: Negative for hot flashes.  Genitourinary:  Negative for difficulty urinating and frequency.   Musculoskeletal:  Negative for arthralgias.       Chronic intermittent back pain  Skin:  Negative for itching and rash.  Neurological:  Negative for extremity weakness.  Hematological:  Negative for adenopathy.  Psychiatric/Behavioral:  Negative for confusion.     Performance status (ECOG): 0 - Asymptomatic  Vital Signs BP (!) 178/72 (BP Location: Right Arm, Patient Position: Sitting) Comment: patient aware to report readings over 140/90 to PCP. reports most readings at home are within normal range. patient states she took her medication this morning  Pulse (!) 58   Temp (!) 97.3 F (36.3 C) (Tympanic)   Ht 5' 2 (1.575 m)   Wt 129 lb (58.5 kg)   SpO2 96%   BMI 23.59 kg/m   Physical Exam Constitutional:      General: She is not in acute distress.    Appearance: She is not diaphoretic.  HENT:     Head: Normocephalic and atraumatic.  Eyes:     General: No scleral icterus. Cardiovascular:     Rate and Rhythm: Normal rate and regular rhythm.  Pulmonary:     Effort: Pulmonary effort is normal. No respiratory distress.  Abdominal:     General: There is no distension.     Palpations: Abdomen is soft.     Tenderness: There is no abdominal tenderness.  Musculoskeletal:        General: Normal range of motion.     Cervical back: Normal range of motion and neck supple.  Skin:    General: Skin is dry.     Findings: Lesion present. No erythema.  Neurological:     Mental Status: She is alert and oriented to  person, place, and time. Mental status is at  baseline.  Psychiatric:        Mood and Affect: Mood and affect normal.     RADIOGRAPHIC STUDIES: I have personally reviewed the radiological images as listed and agreed with the findings in the report. DG OR UROLOGY CYSTO IMAGE ([facility] ONLY) Result Date: 12/29/2023 There is no interpretation for this exam.  This order is for images obtained during a surgical procedure.  Please See Surgeries Tab for more information regarding the procedure.   CT Chest Wo Contrast Result Date: 11/27/2023 CLINICAL DATA:  History of right upper lobe adenocarcinoma status post wedge resection 03/13/2014 and left lower lobe adenocarcinoma status post wedge resection 03/10/2017 with locally recurrent non-small-cell lung cancer status post chemoradiation, now on immunotherapy. * Tracking Code: BO * EXAM: CT CHEST WITHOUT CONTRAST TECHNIQUE: Multidetector CT imaging of the chest was performed following the standard protocol without IV contrast. RADIATION DOSE REDUCTION: This exam was performed according to the departmental dose-optimization program which includes automated exposure control, adjustment of the mA and/or kV according to patient size and/or use of iterative reconstruction technique. COMPARISON:  CT chest dated 07/27/2023 FINDINGS: Cardiovascular: Normal heart size. No significant pericardial fluid/thickening. Great vessels are normal in course and caliber. Coronary artery calcifications and aortic atherosclerosis. Mediastinum/Nodes: Imaged thyroid  gland without nodules meeting criteria for imaging follow-up by size. Small hiatal hernia. Decreased size of previously hypermetabolic precarinal lymph node measuring 5 mm (2:57), previously 10 mm. New lymphadenopathy. Lungs/Pleura: The central airways are patent. Postsurgical changes of the right upper and left lower lobes. Biapical pleural-parenchymal scarring. Mild-to-moderate centrilobular and paraseptal emphysema. Unchanged  architectural distortion in the medial left upper lobe (4:33). Scattered pulmonary nodules are also unchanged: -8 x 6 mm posteromedial right upper lobe (4:58) -6 mm perifissural left upper lobe (4:85) -3 mm anterior right middle lobe (4:95) -8 x 6 mm basilar left lower lobe (4:124) Unchanged posterior right lower lobe ground-glass nodule measures 1.7 x 1.4 cm (4:86). No pneumothorax. No pleural effusion. Upper abdomen: Unchanged hepatic hypodensities, likely cysts. Musculoskeletal: No acute or abnormal lytic or blastic osseous lesions. Multilevel degenerative changes of the thoracic spine. IMPRESSION: 1. Continued decrease in size of previously hypermetabolic precarinal lymph node. No new lymphadenopathy. 2. Unchanged bilateral solid pulmonary nodules and right lower lobe ground-glass nodule. 3. Aortic Atherosclerosis (ICD10-I70.0) and Emphysema (ICD10-J43.9). Coronary artery calcifications. Assessment for potential risk factor modification, dietary therapy or pharmacologic therapy may be warranted, if clinically indicated. Electronically Signed   By: Limin  Xu M.D.   On: 11/27/2023 12:09    Laboratory results were reviewed by me    Latest Ref Rng & Units 01/24/2024    9:57 AM 01/10/2024   10:27 AM 12/27/2023   10:03 AM  CBC  WBC 4.0 - 10.5 K/uL 5.5  5.8  5.4   Hemoglobin 12.0 - 15.0 g/dL 85.6  86.2  85.7   Hematocrit 36.0 - 46.0 % 43.5  41.9  42.2   Platelets 150 - 400 K/uL 166  175  157       Latest Ref Rng & Units 01/24/2024    9:57 AM 01/10/2024   10:26 AM 12/27/2023   10:03 AM  CMP  Glucose 70 - 99 mg/dL 91  91  888   BUN 8 - 23 mg/dL 15  18  19    Creatinine 0.44 - 1.00 mg/dL 9.17  9.18  9.15   Sodium 135 - 145 mmol/L 141  144  140   Potassium 3.5 - 5.1 mmol/L 4.3  4.2  4.0  Chloride 98 - 111 mmol/L 104  107  104   CO2 22 - 32 mmol/L 25  26  27    Calcium  8.9 - 10.3 mg/dL 9.2  9.4  9.0   Total Protein 6.5 - 8.1 g/dL 6.3  6.5  6.5   Total Bilirubin 0.0 - 1.2 mg/dL 0.5  0.4  0.7    Alkaline Phos 38 - 126 U/L 54  52  47   AST 15 - 41 U/L 22  23  27    ALT 0 - 44 U/L 14  16  27

## 2024-01-24 NOTE — Assessment & Plan Note (Signed)
Same plan as above.  

## 2024-01-24 NOTE — Assessment & Plan Note (Signed)
#  History of stage Ib right lung upper lobe adenocarcinoma status post wedge resection in 2016 History of left lower lobe adenocarcinoma T1 a NX status post wedge resection 2019. Recurrence - Biopsy of pretracheal lymph node positive for NSCLC.  MRI brain is negative.  Recommend concurrent chemotherapy [carboplatin  taxol ]  with radiation.  Labs are reviewed and discussed with patient. S/p concurrent carboplatin  AUC2, taxol  45mg /m with Radiation.    Recommend immunotherapy consolidation with Durvalumab  Q2weeks for up to a year.  Labs are reviewed and discussed with patient. Oct 2025 CT showed treatment response.  Proceed with Durvalumab

## 2024-01-27 ENCOUNTER — Inpatient Hospital Stay

## 2024-01-27 ENCOUNTER — Inpatient Hospital Stay: Admitting: Oncology

## 2024-02-01 ENCOUNTER — Encounter: Payer: Self-pay | Admitting: Oncology

## 2024-02-07 ENCOUNTER — Inpatient Hospital Stay: Admitting: Oncology

## 2024-02-07 ENCOUNTER — Encounter: Payer: Self-pay | Admitting: Oncology

## 2024-02-07 ENCOUNTER — Inpatient Hospital Stay: Attending: Oncology

## 2024-02-07 ENCOUNTER — Inpatient Hospital Stay

## 2024-02-07 VITALS — BP 122/57 | HR 62

## 2024-02-07 VITALS — BP 129/62 | HR 61 | Temp 98.1°F | Resp 18 | Wt 130.3 lb

## 2024-02-07 DIAGNOSIS — C3432 Malignant neoplasm of lower lobe, left bronchus or lung: Secondary | ICD-10-CM

## 2024-02-07 DIAGNOSIS — Z87891 Personal history of nicotine dependence: Secondary | ICD-10-CM | POA: Insufficient documentation

## 2024-02-07 DIAGNOSIS — I2510 Atherosclerotic heart disease of native coronary artery without angina pectoris: Secondary | ICD-10-CM | POA: Insufficient documentation

## 2024-02-07 DIAGNOSIS — Z860100 Personal history of colon polyps, unspecified: Secondary | ICD-10-CM | POA: Insufficient documentation

## 2024-02-07 DIAGNOSIS — C3411 Malignant neoplasm of upper lobe, right bronchus or lung: Secondary | ICD-10-CM

## 2024-02-07 DIAGNOSIS — I700 Atherosclerosis of aorta: Secondary | ICD-10-CM | POA: Diagnosis not present

## 2024-02-07 DIAGNOSIS — E041 Nontoxic single thyroid nodule: Secondary | ICD-10-CM | POA: Diagnosis not present

## 2024-02-07 DIAGNOSIS — I10 Essential (primary) hypertension: Secondary | ICD-10-CM | POA: Insufficient documentation

## 2024-02-07 DIAGNOSIS — Z85118 Personal history of other malignant neoplasm of bronchus and lung: Secondary | ICD-10-CM | POA: Insufficient documentation

## 2024-02-07 DIAGNOSIS — Z5112 Encounter for antineoplastic immunotherapy: Secondary | ICD-10-CM

## 2024-02-07 DIAGNOSIS — R591 Generalized enlarged lymph nodes: Secondary | ICD-10-CM | POA: Insufficient documentation

## 2024-02-07 DIAGNOSIS — J432 Centrilobular emphysema: Secondary | ICD-10-CM | POA: Diagnosis not present

## 2024-02-07 DIAGNOSIS — Z79899 Other long term (current) drug therapy: Secondary | ICD-10-CM | POA: Insufficient documentation

## 2024-02-07 DIAGNOSIS — M47814 Spondylosis without myelopathy or radiculopathy, thoracic region: Secondary | ICD-10-CM | POA: Insufficient documentation

## 2024-02-07 DIAGNOSIS — Z79620 Long term (current) use of immunosuppressive biologic: Secondary | ICD-10-CM | POA: Insufficient documentation

## 2024-02-07 DIAGNOSIS — K449 Diaphragmatic hernia without obstruction or gangrene: Secondary | ICD-10-CM | POA: Insufficient documentation

## 2024-02-07 DIAGNOSIS — E785 Hyperlipidemia, unspecified: Secondary | ICD-10-CM | POA: Diagnosis not present

## 2024-02-07 DIAGNOSIS — K219 Gastro-esophageal reflux disease without esophagitis: Secondary | ICD-10-CM | POA: Diagnosis not present

## 2024-02-07 DIAGNOSIS — N1330 Unspecified hydronephrosis: Secondary | ICD-10-CM | POA: Diagnosis not present

## 2024-02-07 LAB — CBC WITH DIFFERENTIAL (CANCER CENTER ONLY)
Abs Immature Granulocytes: 0.02 K/uL (ref 0.00–0.07)
Basophils Absolute: 0 K/uL (ref 0.0–0.1)
Basophils Relative: 0 %
Eosinophils Absolute: 0.1 K/uL (ref 0.0–0.5)
Eosinophils Relative: 1 %
HCT: 39.5 % (ref 36.0–46.0)
Hemoglobin: 13.2 g/dL (ref 12.0–15.0)
Immature Granulocytes: 0 %
Lymphocytes Relative: 18 %
Lymphs Abs: 0.9 K/uL (ref 0.7–4.0)
MCH: 33.8 pg (ref 26.0–34.0)
MCHC: 33.4 g/dL (ref 30.0–36.0)
MCV: 101 fL — ABNORMAL HIGH (ref 80.0–100.0)
Monocytes Absolute: 0.5 K/uL (ref 0.1–1.0)
Monocytes Relative: 10 %
Neutro Abs: 3.5 K/uL (ref 1.7–7.7)
Neutrophils Relative %: 71 %
Platelet Count: 216 K/uL (ref 150–400)
RBC: 3.91 MIL/uL (ref 3.87–5.11)
RDW: 13.7 % (ref 11.5–15.5)
WBC Count: 5 K/uL (ref 4.0–10.5)
nRBC: 0 % (ref 0.0–0.2)

## 2024-02-07 LAB — CMP (CANCER CENTER ONLY)
ALT: 15 U/L (ref 0–44)
AST: 24 U/L (ref 15–41)
Albumin: 3.8 g/dL (ref 3.5–5.0)
Alkaline Phosphatase: 62 U/L (ref 38–126)
Anion gap: 11 (ref 5–15)
BUN: 17 mg/dL (ref 8–23)
CO2: 24 mmol/L (ref 22–32)
Calcium: 9.5 mg/dL (ref 8.9–10.3)
Chloride: 106 mmol/L (ref 98–111)
Creatinine: 0.82 mg/dL (ref 0.44–1.00)
GFR, Estimated: >60 mL/min
Glucose, Bld: 128 mg/dL — ABNORMAL HIGH (ref 70–99)
Potassium: 3.9 mmol/L (ref 3.5–5.1)
Sodium: 141 mmol/L (ref 135–145)
Total Bilirubin: 0.3 mg/dL (ref 0.0–1.2)
Total Protein: 6.2 g/dL — ABNORMAL LOW (ref 6.5–8.1)

## 2024-02-07 LAB — TSH: TSH: 2.3 u[IU]/mL (ref 0.350–4.500)

## 2024-02-07 MED ORDER — SODIUM CHLORIDE 0.9 % IV SOLN
INTRAVENOUS | Status: DC
  Filled 2024-02-07 (×2): qty 250

## 2024-02-07 MED ORDER — SODIUM CHLORIDE 0.9 % IV SOLN
10.0000 mg/kg | Freq: Once | INTRAVENOUS | Status: AC
  Administered 2024-02-07: 500 mg via INTRAVENOUS
  Filled 2024-02-07: qty 10

## 2024-02-07 NOTE — Assessment & Plan Note (Signed)
Same plan as above.  

## 2024-02-07 NOTE — Assessment & Plan Note (Signed)
 Immunotherapy plan as listed above

## 2024-02-07 NOTE — Progress Notes (Signed)
 Hematology/Oncology Progress note [phone] [phone]     Chief Complaint:  Follow up for recurrent non small cell lung cancer   ASSESSMENT & PLAN:  Heather [name] is a 89 y.o. Heather [name] female with stage IB adenocarcinoma of the right upper lobe status post wedge resection on 03/13/2014. T2aN1M0 (stage IB).  She is s/p wedge resection of a left lower lobe mass on 03/10/2017. T1aNx (stage IA). No with locally recurrent NSCLC   Malignant neoplasm of lower lobe of left lung (HCC) #History of stage Ib right lung upper lobe adenocarcinoma status post wedge resection in 2016 History of left lower lobe adenocarcinoma T1 a NX status post wedge resection 2019. Recurrence - Biopsy of pretracheal lymph node positive for NSCLC.  MRI brain is negative.  Recommend concurrent chemotherapy [carboplatin  taxol ]  with radiation.  Labs are reviewed and discussed with patient. S/p concurrent carboplatin  AUC2, taxol  45mg /m with Radiation.    Recommend immunotherapy consolidation with Durvalumab  Q2weeks for up to a year.  Labs are reviewed and discussed with patient. Oct 2025 CT showed treatment response.  Proceed with Durvalumab    Encounter for antineoplastic immunotherapy Immunotherapy plan as listed above.  Primary cancer of right upper lobe of lung [facility]) Same plan as above.    Orders Placed This Encounter  Procedures   CBC with Differential (Cancer Center Only)    Standing Status:   Future    Expected Date:   03/07/2024    Expiration Date:   03/07/2025   CMP (Cancer Center only)    Standing Status:   Future    Expected Date:   03/07/2024    Expiration Date:   03/07/2025   Follow up per LOS. All questions were answered. The patient knows to call the clinic with any problems, questions or concerns.  We spent sufficient time to discuss many aspect of care, questions were answered to patient's satisfaction.   [name], MD, PhD [facility] Hematology Oncology 02/07/2024    PERTINENT ONCOLOGY HISTORY Previously follows up with Dr.Corcoran. Estasblish care with me on 03/15/2018  Oncology History  Primary cancer of right upper lobe of lung (HCC)  03/13/2014 Initial Diagnosis   Primary cancer of right upper lobe of lung   PET scan on 02/13/2014 revealed a 1 cm right upper lobe hypermetabolic nodule (SUV 3.8) and a 6 mm right upper lobe nodule (no metabolic activity). There was no mediastianl adenopathy.  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 Pathology revealed a 1.1 cm high-grade adenocarcinoma with pleural invasion. Nodes were negative. Pathologic stage was T2aN1M0 (stage IB).     03/30/2023 Imaging   PET  1. Hypermetabolic pretracheal lymph node is concerning for metastatic adenopathy. 2. No evidence of local recurrence in the RIGHT upper lobe. 3. No evidence of metastatic disease in the abdomen pelvis. 4. Small nodule along the LEFT oblique fissure has metabolic activity; however, nodule has been stable in size since 03/16/2021. Favor benign nodule. 5. LEFT hydronephrosis with double-J ureteral stent in place.   05/16/2023 - 06/27/2023 Chemotherapy   Patient is on Treatment Plan : Carboplatin  + Paclitaxel  Weekly X 6 Weeks with XRT     07/31/2023 Imaging   CT chest wo contrast   1. Interval decrease in size of the precarinal lymph node seen on prior studies. This lymph node is seen to be hypermetabolic on PET-CT 03/30/2023. Close continued attention warranted. 2. Stable 8 mm retro hilar left lung nodule. Additional scattered smaller pulmonary  nodules bilaterally are unchanged. 3. Stable focus of architectural distortion in the medial left upper lobe with a 5 mm nodular component. 4. Aortic Atherosclerosis (ICD10-I70.0) and Emphysema (ICD10-J43.9).   08/04/2023 -  Chemotherapy   Patient is on Treatment Plan : LUNG Durvalumab  (10) q14d     Malignant neoplasm of lower lobe of left lung (HCC)  03/10/2017  Initial Diagnosis   Malignant neoplasm of lower lobe of left lung   Chest CT on 02/10/2017 revealed clear interval progression of the posterior left lower lobe pulmonary nodule, now measuring 12 mm in long axis and having a distinctly lobular contour. Imaging features were very concerning for neoplasm.  There was no change in the 7 mm nodule posterior to the right hilum.   PET scan on 02/18/2017 revealed a 11 x 8 mm posterior left lower lobe pulmonary nodule that had mildly progressed from prior studies and demonstrated mild hypermetabolism (SUV 1.6).  The appearance was considered worrisome for an indolent primary bronchogenic neoplasm.   03/10/2017.  s/p wedge resection of a left lower lobe mass.   Pathology revealed an 8 mm invasive adenocarcinoma with solid and acinar patterns.  There was no visceral invasion.  There was no lymphovascular invasion.  All margins were negative.  Pathologic stage was T1aNx (stage IA).   03/30/2023 Imaging   PET  1. Hypermetabolic pretracheal lymph node is concerning for metastatic adenopathy. 2. No evidence of local recurrence in the RIGHT upper lobe. 3. No evidence of metastatic disease in the abdomen pelvis. 4. Small nodule along the LEFT oblique fissure has metabolic activity; however, nodule has been stable in size since 03/16/2021. Favor benign nodule. 5. LEFT hydronephrosis with double-J ureteral stent in place.   04/27/2023 Cancer Staging   Staging form: Lung, AJCC 8th Edition - Pathologic stage from 04/27/2023: pN2, cM0 - Signed by Babara Call, MD on 04/27/2023 Stage prefix: Recurrence   05/16/2023 - 06/27/2023 Chemotherapy   Patient is on Treatment Plan : Carboplatin  + Paclitaxel  Weekly X 6 Weeks with XRT     07/31/2023 Imaging   CT chest wo contrast   1. Interval decrease in size of the precarinal lymph node seen on prior studies. This lymph node is seen to be hypermetabolic on PET-CT 03/30/2023. Close continued attention warranted. 2. Stable 8 mm retro  hilar left lung nodule. Additional scattered smaller pulmonary nodules bilaterally are unchanged. 3. Stable focus of architectural distortion in the medial left upper lobe with a 5 mm nodular component. 4. Aortic Atherosclerosis (ICD10-I70.0) and Emphysema (ICD10-J43.9).   08/04/2023 -  Chemotherapy   Patient is on Treatment Plan : LUNG Durvalumab  (10) q14d      Status post cystoscopy for management of chronic left UPJ obstruction with stent exchange..  INTERVAL HISTORY Heather [name] is a 89 y.o. female who has above history reviewed by me today presents for follow up visit for management of recurrent lung cancer, .  Patient reports feeling well.  She was accompanied by her husband Denies shortness of breath, fatigue, hemoptysis, unintentional weight loss.Denies headache, nausea vomiting, focal weakness    Past Medical History:  Diagnosis Date   Anginal pain    Benign essential hypertension    Brain aneurysm    Carotid artery stenosis    Carotid stenosis 06/19/2013   Colonic polyp    Dyspnea    Emphysema lung (HCC)    GERD (gastroesophageal reflux disease)    Hiatal hernia    Hypercholesteremia    Hyperlipidemia, mixed 06/09/2015  Lung cancer (HCC) 2016   Lung nodule 06/21/2014   Macrocytic 10/06/2014   Multiple thyroid  nodules    Obstruction of left ureteropelvic junction (UPJ)    PAT (paroxysmal atrial tachycardia) 06/19/2013   Plantar fasciitis     Past Surgical History:  Procedure Laterality Date   BREAST EXCISIONAL BIOPSY Right 40 yrs ago   neg   BREAST LUMPECTOMY     benign   CATARACT EXTRACTION W/ INTRAOCULAR LENS  IMPLANT, BILATERAL Bilateral    CEREBRAL ANEURYSM REPAIR  2004   coil treatment   CERVIX LESION DESTRUCTION     COLONOSCOPY     1995, 2006, 2014   COLONOSCOPY WITH PROPOFOL  N/A 03/31/2015   Procedure: COLONOSCOPY WITH PROPOFOL ;  Surgeon: Lamar ONEIDA Holmes, MD;  Location: [facility] ENDOSCOPY;  Service: Endoscopy;  Laterality: N/A;   CYSTOSCOPY W/  RETROGRADES Left 03/02/2022   Procedure: CYSTOSCOPY WITH RETROGRADE PYELOGRAM;  Surgeon: Twylla Glendia BROCKS, MD;  Location: [facility] ORS;  Service: Urology;  Laterality: Left;   CYSTOSCOPY W/ RETROGRADES Left 12/28/2022   Procedure: CYSTOSCOPY WITH RETROGRADE PYELOGRAM;  Surgeon: Twylla Glendia BROCKS, MD;  Location: [facility] ORS;  Service: Urology;  Laterality: Left;   CYSTOSCOPY W/ RETROGRADES Left 12/29/2023   Procedure: CYSTOSCOPY, WITH RETROGRADE PYELOGRAM;  Surgeon: Twylla Glendia BROCKS, MD;  Location: [facility] ORS;  Service: Urology;  Laterality: Left;   CYSTOSCOPY W/ URETERAL STENT PLACEMENT Left 03/03/2021   Procedure: CYSTOSCOPY WITH EXCHANGE;  Surgeon: Twylla Glendia BROCKS, MD;  Location: [facility] ORS;  Service: Urology;  Laterality: Left;   CYSTOSCOPY W/ URETERAL STENT PLACEMENT Left 03/02/2022   Procedure: CYSTOSCOPY WITH STENT EXCHANGE;  Surgeon: Twylla Glendia BROCKS, MD;  Location: [facility] ORS;  Service: Urology;  Laterality: Left;   CYSTOSCOPY W/ URETERAL STENT PLACEMENT Left 12/28/2022   Procedure: CYSTOSCOPY WITH STENT EXCHANGE;  Surgeon: Twylla Glendia BROCKS, MD;  Location: [facility] ORS;  Service: Urology;  Laterality: Left;   CYSTOSCOPY W/ URETERAL STENT PLACEMENT Left 12/29/2023   Procedure: CYSTOSCOPY, FLEXIBLE, WITH STENT REPLACEMENT;  Surgeon: Twylla Glendia BROCKS, MD;  Location: [facility] ORS;  Service: Urology;  Laterality: Left;   CYSTOSCOPY WITH STENT PLACEMENT Left 11/25/2020   Procedure: CYSTOSCOPY WITH STENT PLACEMENT;  Surgeon: Twylla Glendia BROCKS, MD;  Location: [facility] ORS;  Service: Urology;  Laterality: Left;   ENDOBRONCHIAL ULTRASOUND Bilateral 04/19/2023   Procedure: ENDOBRONCHIAL ULTRASOUND (EBUS);  Surgeon: Isadora Hose, MD;  Location: [facility] ORS;  Service: Pulmonary;  Laterality: Bilateral;   FLEXIBLE BRONCHOSCOPY N/A 03/10/2017   Procedure: FLEXIBLE BRONCHOSCOPY;  Surgeon: Volney Lye, MD;  Location: [facility] ORS;  Service: Thoracic;  Laterality: N/A;   THORACOSCOPY WITH WEDGE RESECTION LUNG Right 03/13/2014   RUL    THORACOTOMY/LOBECTOMY Left 03/10/2017   Procedure: THORACOTOMY WITH LUNG WEDGE RESECTION POSSIBLE LOBECTOMY;  Surgeon: Volney Lye, MD;  Location: [facility] ORS;  Service: Thoracic;  Laterality: Left;   TUBAL LIGATION      Family History  Problem Relation Age of Onset   Alcohol abuse Mother    Heart attack Father    Breast cancer Neg Hx     Social History:  reports that she quit smoking about 35 years ago. Her smoking use included cigarettes. She started smoking about 70 years ago. She has a 35 pack-year smoking history. She has never used smokeless tobacco. She reports current alcohol use. She reports that she does not use drugs.   Allergies:  Allergies  Allergen Reactions   Ace Inhibitors Swelling   Contrast Media [Iodinated Contrast Media]    Cortisone  Patient had facial swelling and itching from cortisone injection IM    Current Medications: Current Outpatient Medications  Medication Sig Dispense Refill   amLODipine  (NORVASC ) 5 MG tablet Take 5 mg by mouth at bedtime.     atenolol  (TENORMIN ) 50 MG tablet Take 50 mg by mouth in the morning.     cetirizine (ZYRTEC) 10 MG tablet Take 10 mg by mouth daily.     cholecalciferol  (VITAMIN D3) 25 MCG (1000 UNIT) tablet Take 1,000 Units by mouth daily.     dextromethorphan-guaiFENesin  (MUCINEX  DM) 30-600 MG 12hr tablet Take 1 tablet by mouth 2 (two) times daily as needed for cough.     donepezil  (ARICEPT ) 10 MG tablet Take 10 mg by mouth at bedtime.     fluticasone  (FLONASE ) 50 MCG/ACT nasal spray Place 1 spray into the nose daily as needed for allergies.      simvastatin  (ZOCOR ) 20 MG tablet Take 20 mg by mouth at bedtime.     trospium  (SANCTURA ) 20 MG tablet Take 1 tablet (20 mg total) by mouth 2 (two) times daily as needed (frequency,urgency,bladder spasm). 20 tablet 0   vitamin B-12 (CYANOCOBALAMIN ) 100 MCG tablet Take 100 mcg by mouth daily.     No current facility-administered medications for this visit.   Review of Systems   Constitutional:  Negative for appetite change, chills, fatigue and fever.  HENT:   Positive for hearing loss. Negative for voice change.   Eyes:  Negative for eye problems.  Respiratory:  Negative for chest tightness and cough.   Cardiovascular:  Negative for chest pain.  Gastrointestinal:  Negative for abdominal distention, abdominal pain, blood in stool and nausea.  Endocrine: Negative for hot flashes.  Genitourinary:  Negative for difficulty urinating and frequency.   Musculoskeletal:  Negative for arthralgias.       Chronic intermittent back pain  Skin:  Negative for itching and rash.  Neurological:  Negative for extremity weakness.  Hematological:  Negative for adenopathy.  Psychiatric/Behavioral:  Negative for confusion.     Performance status (ECOG): 0 - Asymptomatic  Vital Signs BP 129/62   Pulse 61   Temp 98.1 F (36.7 C)   Resp 18   Wt 130 lb 4.8 oz (59.1 kg)   SpO2 98%   BMI 23.83 kg/m   Physical Exam Constitutional:      General: She is not in acute distress.    Appearance: She is not diaphoretic.  HENT:     Head: Normocephalic and atraumatic.  Eyes:     General: No scleral icterus. Cardiovascular:     Rate and Rhythm: Normal rate and regular rhythm.  Pulmonary:     Effort: Pulmonary effort is normal. No respiratory distress.  Abdominal:     General: There is no distension.     Palpations: Abdomen is soft.     Tenderness: There is no abdominal tenderness.  Musculoskeletal:        General: Normal range of motion.     Cervical back: Normal range of motion and neck supple.  Skin:    General: Skin is dry.     Findings: Lesion present. No erythema.  Neurological:     Mental Status: She is alert and oriented to person, place, and time. Mental status is at baseline.  Psychiatric:        Mood and Affect: Mood and affect normal.     RADIOGRAPHIC STUDIES: I have personally reviewed the radiological images as listed and agreed with the findings in the  report. DG OR UROLOGY CYSTO IMAGE ([facility] ONLY) Result Date: 12/29/2023 There is no interpretation for this exam.  This order is for images obtained during a surgical procedure.  Please See Surgeries Tab for more information regarding the procedure.   CT Chest Wo Contrast Result Date: 11/27/2023 CLINICAL DATA:  History of right upper lobe adenocarcinoma status post wedge resection 03/13/2014 and left lower lobe adenocarcinoma status post wedge resection 03/10/2017 with locally recurrent non-small-cell lung cancer status post chemoradiation, now on immunotherapy. * Tracking Code: BO * EXAM: CT CHEST WITHOUT CONTRAST TECHNIQUE: Multidetector CT imaging of the chest was performed following the standard protocol without IV contrast. RADIATION DOSE REDUCTION: This exam was performed according to the departmental dose-optimization program which includes automated exposure control, adjustment of the mA and/or kV according to patient size and/or use of iterative reconstruction technique. COMPARISON:  CT chest dated 07/27/2023 FINDINGS: Cardiovascular: Normal heart size. No significant pericardial fluid/thickening. Great vessels are normal in course and caliber. Coronary artery calcifications and aortic atherosclerosis. Mediastinum/Nodes: Imaged thyroid  gland without nodules meeting criteria for imaging follow-up by size. Small hiatal hernia. Decreased size of previously hypermetabolic precarinal lymph node measuring 5 mm (2:57), previously 10 mm. New lymphadenopathy. Lungs/Pleura: The central airways are patent. Postsurgical changes of the right upper and left lower lobes. Biapical pleural-parenchymal scarring. Mild-to-moderate centrilobular and paraseptal emphysema. Unchanged architectural distortion in the medial left upper lobe (4:33). Scattered pulmonary nodules are also unchanged: -8 x 6 mm posteromedial right upper lobe (4:58) -6 mm perifissural left upper lobe (4:85) -3 mm anterior right middle lobe (4:95) -8 x  6 mm basilar left lower lobe (4:124) Unchanged posterior right lower lobe ground-glass nodule measures 1.7 x 1.4 cm (4:86). No pneumothorax. No pleural effusion. Upper abdomen: Unchanged hepatic hypodensities, likely cysts. Musculoskeletal: No acute or abnormal lytic or blastic osseous lesions. Multilevel degenerative changes of the thoracic spine. IMPRESSION: 1. Continued decrease in size of previously hypermetabolic precarinal lymph node. No new lymphadenopathy. 2. Unchanged bilateral solid pulmonary nodules and right lower lobe ground-glass nodule. 3. Aortic Atherosclerosis (ICD10-I70.0) and Emphysema (ICD10-J43.9). Coronary artery calcifications. Assessment for potential risk factor modification, dietary therapy or pharmacologic therapy may be warranted, if clinically indicated. Electronically Signed   By: Limin  Xu M.D.   On: 11/27/2023 12:09    Laboratory results were reviewed by me    Latest Ref Rng & Units 02/07/2024   10:40 AM 01/24/2024    9:57 AM 01/10/2024   10:27 AM  CBC  WBC 4.0 - 10.5 K/uL 5.0  5.5  5.8   Hemoglobin 12.0 - 15.0 g/dL 86.7  85.6  86.2   Hematocrit 36.0 - 46.0 % 39.5  43.5  41.9   Platelets 150 - 400 K/uL 216  166  175       Latest Ref Rng & Units 02/07/2024   10:40 AM 01/24/2024    9:57 AM 01/10/2024   10:26 AM  CMP  Glucose 70 - 99 mg/dL 871  91  91   BUN 8 - 23 mg/dL 17  15  18    Creatinine 0.44 - 1.00 mg/dL 9.17  9.17  9.18   Sodium 135 - 145 mmol/L 141  141  144   Potassium 3.5 - 5.1 mmol/L 3.9  4.3  4.2   Chloride 98 - 111 mmol/L 106  104  107   CO2 22 - 32 mmol/L 24  25  26    Calcium  8.9 - 10.3 mg/dL 9.5  9.2  9.4   Total Protein  6.5 - 8.1 g/dL 6.2  6.3  6.5   Total Bilirubin 0.0 - 1.2 mg/dL 0.3  0.5  0.4   Alkaline Phos 38 - 126 U/L 62  54  52   AST 15 - 41 U/L 24  22  23    ALT 0 - 44 U/L 15  14  16

## 2024-02-07 NOTE — Assessment & Plan Note (Signed)
#  History of stage Ib right lung upper lobe adenocarcinoma status post wedge resection in 2016 History of left lower lobe adenocarcinoma T1 a NX status post wedge resection 2019. Recurrence - Biopsy of pretracheal lymph node positive for NSCLC.  MRI brain is negative.  Recommend concurrent chemotherapy [carboplatin  taxol ]  with radiation.  Labs are reviewed and discussed with patient. S/p concurrent carboplatin  AUC2, taxol  45mg /m with Radiation.    Recommend immunotherapy consolidation with Durvalumab  Q2weeks for up to a year.  Labs are reviewed and discussed with patient. Oct 2025 CT showed treatment response.  Proceed with Durvalumab

## 2024-02-07 NOTE — Progress Notes (Signed)
Pt here for follow up. No new concerns.

## 2024-02-08 LAB — T4: T4, Total: 5.2 ug/dL (ref 4.5–12.0)

## 2024-02-22 ENCOUNTER — Encounter: Payer: Self-pay | Admitting: Oncology

## 2024-02-22 ENCOUNTER — Inpatient Hospital Stay

## 2024-02-22 ENCOUNTER — Inpatient Hospital Stay: Admitting: Oncology

## 2024-02-22 VITALS — BP 146/66 | HR 63 | Temp 96.4°F | Resp 18 | Wt 131.5 lb

## 2024-02-22 VITALS — BP 123/66 | HR 66 | Resp 18

## 2024-02-22 DIAGNOSIS — Z5112 Encounter for antineoplastic immunotherapy: Secondary | ICD-10-CM | POA: Diagnosis not present

## 2024-02-22 DIAGNOSIS — C3432 Malignant neoplasm of lower lobe, left bronchus or lung: Secondary | ICD-10-CM

## 2024-02-22 DIAGNOSIS — C3411 Malignant neoplasm of upper lobe, right bronchus or lung: Secondary | ICD-10-CM

## 2024-02-22 DIAGNOSIS — R5383 Other fatigue: Secondary | ICD-10-CM | POA: Diagnosis not present

## 2024-02-22 LAB — CMP (CANCER CENTER ONLY)
ALT: 16 U/L (ref 0–44)
AST: 27 U/L (ref 15–41)
Albumin: 4 g/dL (ref 3.5–5.0)
Alkaline Phosphatase: 69 U/L (ref 38–126)
Anion gap: 12 (ref 5–15)
BUN: 13 mg/dL (ref 8–23)
CO2: 25 mmol/L (ref 22–32)
Calcium: 9.7 mg/dL (ref 8.9–10.3)
Chloride: 104 mmol/L (ref 98–111)
Creatinine: 0.74 mg/dL (ref 0.44–1.00)
GFR, Estimated: >60 mL/min
Glucose, Bld: 101 mg/dL — ABNORMAL HIGH (ref 70–99)
Potassium: 3.9 mmol/L (ref 3.5–5.1)
Sodium: 142 mmol/L (ref 135–145)
Total Bilirubin: 0.4 mg/dL (ref 0.0–1.2)
Total Protein: 6.8 g/dL (ref 6.5–8.1)

## 2024-02-22 LAB — CBC WITH DIFFERENTIAL (CANCER CENTER ONLY)
Abs Immature Granulocytes: 0.03 10*3/uL (ref 0.00–0.07)
Basophils Absolute: 0 10*3/uL (ref 0.0–0.1)
Basophils Relative: 1 %
Eosinophils Absolute: 0.1 10*3/uL (ref 0.0–0.5)
Eosinophils Relative: 1 %
HCT: 42.3 % (ref 36.0–46.0)
Hemoglobin: 14.1 g/dL (ref 12.0–15.0)
Immature Granulocytes: 1 %
Lymphocytes Relative: 17 %
Lymphs Abs: 1.1 10*3/uL (ref 0.7–4.0)
MCH: 33.7 pg (ref 26.0–34.0)
MCHC: 33.3 g/dL (ref 30.0–36.0)
MCV: 101.2 fL — ABNORMAL HIGH (ref 80.0–100.0)
Monocytes Absolute: 0.6 10*3/uL (ref 0.1–1.0)
Monocytes Relative: 9 %
Neutro Abs: 4.4 10*3/uL (ref 1.7–7.7)
Neutrophils Relative %: 71 %
Platelet Count: 227 10*3/uL (ref 150–400)
RBC: 4.18 MIL/uL (ref 3.87–5.11)
RDW: 12.7 % (ref 11.5–15.5)
WBC Count: 6.2 10*3/uL (ref 4.0–10.5)
nRBC: 0 % (ref 0.0–0.2)

## 2024-02-22 MED ORDER — SODIUM CHLORIDE 0.9 % IV SOLN
INTRAVENOUS | Status: DC
  Filled 2024-02-22: qty 250

## 2024-02-22 MED ORDER — SODIUM CHLORIDE 0.9 % IV SOLN
10.0000 mg/kg | Freq: Once | INTRAVENOUS | Status: AC
  Administered 2024-02-22: 500 mg via INTRAVENOUS
  Filled 2024-02-22: qty 10

## 2024-02-22 NOTE — Assessment & Plan Note (Signed)
Same plan as above.  

## 2024-02-22 NOTE — Patient Instructions (Signed)
 [facility] - A DEPT OF [facility]  Discharge Instructions: Thank you for choosing [facility] to provide your oncology and hematology care.  If you have a lab appointment with the Cancer Center, please go directly to the Cancer Center and check in at the registration area.  Wear comfortable clothing and clothing appropriate for easy access to any Portacath or PICC line.   We strive to give you quality time with your provider. You may need to reschedule your appointment if you arrive late (15 or more minutes).  Arriving late affects you and other patients whose appointments are after yours.  Also, if you miss three or more appointments without notifying the office, you may be dismissed from the clinic at the provider's discretion.      For prescription refill requests, have your pharmacy contact our office and allow 72 hours for refills to be completed.    Today you received the following chemotherapy and/or immunotherapy agents IMFINZI      To help prevent nausea and vomiting after your treatment, we encourage you to take your nausea medication as directed.  BELOW ARE SYMPTOMS THAT SHOULD BE REPORTED IMMEDIATELY: *FEVER GREATER THAN 100.4 F (38 C) OR HIGHER *CHILLS OR SWEATING *NAUSEA AND VOMITING THAT IS NOT CONTROLLED WITH YOUR NAUSEA MEDICATION *UNUSUAL SHORTNESS OF BREATH *UNUSUAL BRUISING OR BLEEDING *URINARY PROBLEMS (pain or burning when urinating, or frequent urination) *BOWEL PROBLEMS (unusual diarrhea, constipation, pain near the anus) TENDERNESS IN MOUTH AND THROAT WITH OR WITHOUT PRESENCE OF ULCERS (sore throat, sores in mouth, or a toothache) UNUSUAL RASH, SWELLING OR PAIN  UNUSUAL VAGINAL DISCHARGE OR ITCHING   Items with * indicate a potential emergency and should be followed up as soon as possible or go to the Emergency Department if any problems should occur.  Please show the CHEMOTHERAPY ALERT CARD or IMMUNOTHERAPY  ALERT CARD at check-in to the Emergency Department and triage nurse.  Should you have questions after your visit or need to cancel or reschedule your appointment, please contact [facility] - A DEPT OF [facility]  [phone] and follow the prompts.  Office hours are 8:00 a.m. to 4:30 p.m. Monday - Friday. Please note that voicemails left after 4:00 p.m. may not be returned until the following business day.  We are closed weekends and major holidays. You have access to a nurse at all times for urgent questions. Please call the main number to the clinic [phone] and follow the prompts.  For any non-urgent questions, you may also contact your provider using MyChart. We now offer e-Visits for anyone 30 and older to request care online for non-urgent symptoms. For details visit [email or web address].   Also download the MyChart app! Go to the app store, search "MyChart", open the app, select [facility], and log in with your MyChart username and password.  Durvalumab Injection What is this medication? DURVALUMAB (dur VAL ue mab) treats some types of cancer. It works by helping your immune system slow or stop the spread of cancer cells. It is a monoclonal antibody. This medicine may be used for other purposes; ask your health care provider or pharmacist if you have questions. COMMON BRAND NAME(S): IMFINZI What should I tell my care team before I take this medication? They need to know if you have any of these conditions: Allogeneic stem cell transplant (uses someone else's stem cells) Autoimmune diseases, such as Crohn disease, ulcerative  colitis, lupus History of chest radiation Nervous system problems, such as Guillain-Barre syndrome, myasthenia gravis Organ transplant An unusual or allergic reaction to durvalumab, other medications, foods, dyes, or preservatives Pregnant or trying to get pregnant Breast-feeding How should I use this medication? This  medication is infused into a vein. It is given by your care team in a hospital or clinic setting. A special MedGuide will be given to you before each treatment. Be sure to read this information carefully each time. Talk to your care team about the use of this medication in children. Special care may be needed. Overdosage: If you think you have taken too much of this medicine contact a poison control center or emergency room at once. NOTE: This medicine is only for you. Do not share this medicine with others. What if I miss a dose? Keep appointments for follow-up doses. It is important not to miss your dose. Call your care team if you are unable to keep an appointment. What may interact with this medication? Interactions have not been studied. This list may not describe all possible interactions. Give your health care provider a list of all the medicines, herbs, non-prescription drugs, or dietary supplements you use. Also tell them if you smoke, drink alcohol, or use illegal drugs. Some items may interact with your medicine. What should I watch for while using this medication? Your condition will be monitored carefully while you are receiving this medication. You may need blood work while taking this medication. This medication may cause serious skin reactions. They can happen weeks to months after starting the medication. Contact your care team right away if you notice fevers or flu-like symptoms with a rash. The rash may be red or purple and then turn into blisters or peeling of the skin. You may also notice a red rash with swelling of the face, lips, or lymph nodes in your neck or under your arms. Tell your care team right away if you have any change in your eyesight. Talk to your care team if you may be pregnant. Serious birth defects can occur if you take this medication during pregnancy and for 3 months after the last dose. You will need a negative pregnancy test before starting this medication.  Contraception is recommended while taking this medication and for 3 months after the last dose. Your care team can help you find the option that works for you. Do not breastfeed while taking this medication and for 3 months after the last dose. What side effects may I notice from receiving this medication? Side effects that you should report to your care team as soon as possible: Allergic reactions--skin rash, itching, hives, swelling of the face, lips, tongue, or throat Dry cough, shortness of breath or trouble breathing Eye pain, redness, irritation, or discharge with blurry or decreased vision Heart muscle inflammation--unusual weakness or fatigue, shortness of breath, chest pain, fast or irregular heartbeat, dizziness, swelling of the ankles, feet, or hands Hormone gland problems--headache, sensitivity to light, unusual weakness or fatigue, dizziness, fast or irregular heartbeat, increased sensitivity to cold or heat, excessive sweating, constipation, hair loss, increased thirst or amount of urine, tremors or shaking, irritability Infusion reactions--chest pain, shortness of breath or trouble breathing, feeling faint or lightheaded Kidney injury (glomerulonephritis)--decrease in the amount of urine, red or dark brown urine, foamy or bubbly urine, swelling of the ankles, hands, or feet Liver injury--right upper belly pain, loss of appetite, nausea, light-colored stool, dark yellow or brown urine, yellowing skin or eyes,  unusual weakness or fatigue Pain, tingling, or numbness in the hands or feet, muscle weakness, change in vision, confusion or trouble speaking, loss of balance or coordination, trouble walking, seizures Rash, fever, and swollen lymph nodes Redness, blistering, peeling, or loosening of the skin, including inside the mouth Sudden or severe stomach pain, bloody diarrhea, fever, nausea, vomiting Side effects that usually do not require medical attention (report these to your care team  if they continue or are bothersome): Bone, joint, or muscle pain Diarrhea Fatigue Loss of appetite Nausea Skin rash This list may not describe all possible side effects. Call your doctor for medical advice about side effects. You may report side effects to FDA at 1-800-FDA-1088. Where should I keep my medication? This medication is given in a hospital or clinic. It will not be stored at home. NOTE: This sheet is a summary. It may not cover all possible information. If you have questions about this medicine, talk to your doctor, pharmacist, or health care provider.  2024 [employer] Standard (2021-05-26 00:00:00)

## 2024-02-22 NOTE — Assessment & Plan Note (Addendum)
#  History of stage Ib right lung upper lobe adenocarcinoma status post wedge resection in 2016 History of left lower lobe adenocarcinoma T1 a NX status post wedge resection 2019. Recurrence - Biopsy of pretracheal lymph node positive for NSCLC.  MRI brain is negative.  Recommend concurrent chemotherapy [carboplatin  taxol ]  with radiation.  Labs are reviewed and discussed with patient. S/p concurrent carboplatin  AUC2, taxol  45mg /m with Radiation.    Recommend immunotherapy consolidation with Durvalumab  Q2weeks for up to a year.  Labs are reviewed and discussed with patient. Oct 2025 CT showed treatment response.  Proceed with Durvalumab  Plan repeat imaging in March

## 2024-02-22 NOTE — Progress Notes (Signed)
 Hematology/Oncology Progress note [phone] [phone]     Chief Complaint:  Follow up for recurrent non small cell lung cancer   ASSESSMENT & PLAN:  Heather [name] is a 89 y.o. [name] female with stage IB adenocarcinoma of the right upper lobe status post wedge resection on 03/13/2014. T2aN1M0 (stage IB).  She is s/p wedge resection of a left lower lobe mass on 03/10/2017. T1aNx (stage IA). with locally recurrent NSCLC   Malignant neoplasm of lower lobe of left lung (HCC) #History of stage Ib right lung upper lobe adenocarcinoma status post wedge resection in 2016 History of left lower lobe adenocarcinoma T1 a NX status post wedge resection 2019. Recurrence - Biopsy of pretracheal lymph node positive for NSCLC.  MRI brain is negative.  Recommend concurrent chemotherapy [carboplatin  taxol ]  with radiation.  Labs are reviewed and discussed with patient. S/p concurrent carboplatin  AUC2, taxol  45mg /m with Radiation.    Recommend immunotherapy consolidation with Durvalumab  Q2weeks for up to a year.  Labs are reviewed and discussed with patient. Oct 2025 CT showed treatment response.  Proceed with Durvalumab  Plan repeat imaging in March   Encounter for antineoplastic immunotherapy Immunotherapy plan as listed above.  Primary cancer of right upper lobe of lung [facility]) Same plan as above.   Fatigue Normal TSH T4 2 weeks ago.  Recommend patient to take vitamin D  and B12 supplements    Orders Placed This Encounter  Procedures   CBC with Differential (Cancer Center Only)    Standing Status:   Future    Expected Date:   03/21/2024    Expiration Date:   03/21/2025   CMP (Cancer Center only)    Standing Status:   Future    Expected Date:   03/21/2024    Expiration Date:   03/21/2025   T4    Standing Status:   Future    Expected Date:   03/21/2024    Expiration Date:   03/21/2025   TSH    Standing Status:   Future    Expected Date:   03/21/2024    Expiration Date:    03/21/2025   Follow up per LOS. All questions were answered. The patient knows to call the clinic with any problems, questions or concerns.  We spent sufficient time to discuss many aspect of care, questions were answered to patient's satisfaction.   [name], MD, PhD [facility] Hematology Oncology 02/22/2024   PERTINENT ONCOLOGY HISTORY Previously follows up with Dr.Corcoran. Estasblish care with me on 03/15/2018  Oncology History  Primary cancer of right upper lobe of lung (HCC)  03/13/2014 Initial Diagnosis   Primary cancer of right upper lobe of lung   PET scan on 02/13/2014 revealed a 1 cm right upper lobe hypermetabolic nodule (SUV 3.8) and a 6 mm right upper lobe nodule (no metabolic activity). There was no mediastianl adenopathy.  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 Pathology revealed a 1.1 cm high-grade adenocarcinoma with pleural invasion. Nodes were negative. Pathologic stage was T2aN1M0 (stage IB).     03/30/2023 Imaging   PET  1. Hypermetabolic pretracheal lymph node is concerning for metastatic adenopathy. 2. No evidence of local recurrence in the RIGHT upper lobe. 3. No evidence of metastatic disease in the abdomen pelvis. 4. Small nodule along the LEFT oblique fissure has metabolic activity; however, nodule has been stable in size since 03/16/2021. Favor benign nodule. 5. LEFT hydronephrosis with double-J ureteral stent in place.  05/16/2023 - 06/27/2023 Chemotherapy   Patient is on Treatment Plan : Carboplatin  + Paclitaxel  Weekly X 6 Weeks with XRT     07/31/2023 Imaging   CT chest wo contrast   1. Interval decrease in size of the precarinal lymph node seen on prior studies. This lymph node is seen to be hypermetabolic on PET-CT 03/30/2023. Close continued attention warranted. 2. Stable 8 mm retro hilar left lung nodule. Additional scattered smaller pulmonary nodules bilaterally are unchanged. 3. Stable focus  of architectural distortion in the medial left upper lobe with a 5 mm nodular component. 4. Aortic Atherosclerosis (ICD10-I70.0) and Emphysema (ICD10-J43.9).   08/04/2023 -  Chemotherapy   Patient is on Treatment Plan : LUNG Durvalumab  (10) q14d     Malignant neoplasm of lower lobe of left lung (HCC)  03/10/2017 Initial Diagnosis   Malignant neoplasm of lower lobe of left lung   Chest CT on 02/10/2017 revealed clear interval progression of the posterior left lower lobe pulmonary nodule, now measuring 12 mm in long axis and having a distinctly lobular contour. Imaging features were very concerning for neoplasm.  There was no change in the 7 mm nodule posterior to the right hilum.   PET scan on 02/18/2017 revealed a 11 x 8 mm posterior left lower lobe pulmonary nodule that had mildly progressed from prior studies and demonstrated mild hypermetabolism (SUV 1.6).  The appearance was considered worrisome for an indolent primary bronchogenic neoplasm.   03/10/2017.  s/p wedge resection of a left lower lobe mass.   Pathology revealed an 8 mm invasive adenocarcinoma with solid and acinar patterns.  There was no visceral invasion.  There was no lymphovascular invasion.  All margins were negative.  Pathologic stage was T1aNx (stage IA).   03/30/2023 Imaging   PET  1. Hypermetabolic pretracheal lymph node is concerning for metastatic adenopathy. 2. No evidence of local recurrence in the RIGHT upper lobe. 3. No evidence of metastatic disease in the abdomen pelvis. 4. Small nodule along the LEFT oblique fissure has metabolic activity; however, nodule has been stable in size since 03/16/2021. Favor benign nodule. 5. LEFT hydronephrosis with double-J ureteral stent in place.   04/27/2023 Cancer Staging   Staging form: Lung, AJCC 8th Edition - Pathologic stage from 04/27/2023: pN2, cM0 - Signed by Babara Call, MD on 04/27/2023 Stage prefix: Recurrence   05/16/2023 - 06/27/2023 Chemotherapy   Patient is on  Treatment Plan : Carboplatin  + Paclitaxel  Weekly X 6 Weeks with XRT     07/31/2023 Imaging   CT chest wo contrast   1. Interval decrease in size of the precarinal lymph node seen on prior studies. This lymph node is seen to be hypermetabolic on PET-CT 03/30/2023. Close continued attention warranted. 2. Stable 8 mm retro hilar left lung nodule. Additional scattered smaller pulmonary nodules bilaterally are unchanged. 3. Stable focus of architectural distortion in the medial left upper lobe with a 5 mm nodular component. 4. Aortic Atherosclerosis (ICD10-I70.0) and Emphysema (ICD10-J43.9).   08/04/2023 -  Chemotherapy   Patient is on Treatment Plan : LUNG Durvalumab  (10) q14d      Status post cystoscopy for management of chronic left UPJ obstruction with stent exchange..  INTERVAL HISTORY Heather [name] is a 89 y.o. female who has above history reviewed by me today presents for follow up visit for management of recurrent lung cancer, .  Patient reports feeling well.  She was accompanied by her husband. Husband reports that patient has been more tired, not moving around  too much.  Denies shortness of breath, fatigue, hemoptysis, unintentional weight loss.Denies headache, nausea vomiting, focal weakness    Past Medical History:  Diagnosis Date   Anginal pain    Benign essential hypertension    Brain aneurysm    Carotid artery stenosis    Carotid stenosis 06/19/2013   Colonic polyp    Dyspnea    Emphysema lung (HCC)    GERD (gastroesophageal reflux disease)    Hiatal hernia    Hypercholesteremia    Hyperlipidemia, mixed 06/09/2015   Lung cancer (HCC) 2016   Lung nodule 06/21/2014   Macrocytic 10/06/2014   Multiple thyroid  nodules    Obstruction of left ureteropelvic junction (UPJ)    PAT (paroxysmal atrial tachycardia) 06/19/2013   Plantar fasciitis     Past Surgical History:  Procedure Laterality Date   BREAST EXCISIONAL BIOPSY Right 40 yrs ago   neg   BREAST LUMPECTOMY      benign   CATARACT EXTRACTION W/ INTRAOCULAR LENS  IMPLANT, BILATERAL Bilateral    CEREBRAL ANEURYSM REPAIR  2004   coil treatment   CERVIX LESION DESTRUCTION     COLONOSCOPY     1995, 2006, 2014   COLONOSCOPY WITH PROPOFOL  N/A 03/31/2015   Procedure: COLONOSCOPY WITH PROPOFOL ;  Surgeon: Lamar ONEIDA Holmes, MD;  Location: [facility] ENDOSCOPY;  Service: Endoscopy;  Laterality: N/A;   CYSTOSCOPY W/ RETROGRADES Left 03/02/2022   Procedure: CYSTOSCOPY WITH RETROGRADE PYELOGRAM;  Surgeon: Twylla Glendia BROCKS, MD;  Location: [facility] ORS;  Service: Urology;  Laterality: Left;   CYSTOSCOPY W/ RETROGRADES Left 12/28/2022   Procedure: CYSTOSCOPY WITH RETROGRADE PYELOGRAM;  Surgeon: Twylla Glendia BROCKS, MD;  Location: [facility] ORS;  Service: Urology;  Laterality: Left;   CYSTOSCOPY W/ RETROGRADES Left 12/29/2023   Procedure: CYSTOSCOPY, WITH RETROGRADE PYELOGRAM;  Surgeon: Twylla Glendia BROCKS, MD;  Location: [facility] ORS;  Service: Urology;  Laterality: Left;   CYSTOSCOPY W/ URETERAL STENT PLACEMENT Left 03/03/2021   Procedure: CYSTOSCOPY WITH EXCHANGE;  Surgeon: Twylla Glendia BROCKS, MD;  Location: [facility] ORS;  Service: Urology;  Laterality: Left;   CYSTOSCOPY W/ URETERAL STENT PLACEMENT Left 03/02/2022   Procedure: CYSTOSCOPY WITH STENT EXCHANGE;  Surgeon: Twylla Glendia BROCKS, MD;  Location: [facility] ORS;  Service: Urology;  Laterality: Left;   CYSTOSCOPY W/ URETERAL STENT PLACEMENT Left 12/28/2022   Procedure: CYSTOSCOPY WITH STENT EXCHANGE;  Surgeon: Twylla Glendia BROCKS, MD;  Location: [facility] ORS;  Service: Urology;  Laterality: Left;   CYSTOSCOPY W/ URETERAL STENT PLACEMENT Left 12/29/2023   Procedure: CYSTOSCOPY, FLEXIBLE, WITH STENT REPLACEMENT;  Surgeon: Twylla Glendia BROCKS, MD;  Location: [facility] ORS;  Service: Urology;  Laterality: Left;   CYSTOSCOPY WITH STENT PLACEMENT Left 11/25/2020   Procedure: CYSTOSCOPY WITH STENT PLACEMENT;  Surgeon: Twylla Glendia BROCKS, MD;  Location: [facility] ORS;  Service: Urology;  Laterality: Left;   ENDOBRONCHIAL  ULTRASOUND Bilateral 04/19/2023   Procedure: ENDOBRONCHIAL ULTRASOUND (EBUS);  Surgeon: Isadora Hose, MD;  Location: [facility] ORS;  Service: Pulmonary;  Laterality: Bilateral;   FLEXIBLE BRONCHOSCOPY N/A 03/10/2017   Procedure: FLEXIBLE BRONCHOSCOPY;  Surgeon: Volney Lye, MD;  Location: [facility] ORS;  Service: Thoracic;  Laterality: N/A;   THORACOSCOPY WITH WEDGE RESECTION LUNG Right 03/13/2014   RUL   THORACOTOMY/LOBECTOMY Left 03/10/2017   Procedure: THORACOTOMY WITH LUNG WEDGE RESECTION POSSIBLE LOBECTOMY;  Surgeon: Volney Lye, MD;  Location: [facility] ORS;  Service: Thoracic;  Laterality: Left;   TUBAL LIGATION      Family History  Problem Relation Age of Onset   Alcohol abuse Mother  Heart attack Father    Breast cancer Neg Hx     Social History:  reports that she quit smoking about 35 years ago. Her smoking use included cigarettes. She started smoking about 70 years ago. She has a 35 pack-year smoking history. She has never used smokeless tobacco. She reports current alcohol use. She reports that she does not use drugs.   Allergies:  Allergies  Allergen Reactions   Ace Inhibitors Swelling   Contrast Media [Iodinated Contrast Media]    Cortisone     Patient had facial swelling and itching from cortisone injection IM    Current Medications: Current Outpatient Medications  Medication Sig Dispense Refill   amLODipine  (NORVASC ) 5 MG tablet Take 5 mg by mouth at bedtime.     atenolol  (TENORMIN ) 50 MG tablet Take 50 mg by mouth in the morning.     cetirizine (ZYRTEC) 10 MG tablet Take 10 mg by mouth daily.     cholecalciferol  (VITAMIN D3) 25 MCG (1000 UNIT) tablet Take 1,000 Units by mouth daily.     dextromethorphan-guaiFENesin  (MUCINEX  DM) 30-600 MG 12hr tablet Take 1 tablet by mouth 2 (two) times daily as needed for cough.     donepezil  (ARICEPT ) 10 MG tablet Take 10 mg by mouth at bedtime.     fluticasone  (FLONASE ) 50 MCG/ACT nasal spray Place 1 spray into the nose daily as needed  for allergies.      simvastatin  (ZOCOR ) 20 MG tablet Take 20 mg by mouth at bedtime.     trospium  (SANCTURA ) 20 MG tablet Take 1 tablet (20 mg total) by mouth 2 (two) times daily as needed (frequency,urgency,bladder spasm). 20 tablet 0   vitamin B-12 (CYANOCOBALAMIN ) 100 MCG tablet Take 100 mcg by mouth daily.     No current facility-administered medications for this visit.   Facility-Administered Medications Ordered in Other Visits  Medication Dose Route Frequency Provider Last Rate Last Admin   0.9 %  sodium chloride  infusion   Intravenous Continuous Babara Call, MD 10 mL/hr at 02/22/24 0937 New Bag at 02/22/24 0937   durvalumab  (IMFINZI ) 500 mg in sodium chloride  0.9 % 100 mL chemo infusion  10 mg/kg (Treatment Plan Recorded) Intravenous Once Babara Call, MD       Review of Systems  Constitutional:  Positive for fatigue. Negative for appetite change, chills and fever.  HENT:   Positive for hearing loss. Negative for voice change.   Eyes:  Negative for eye problems.  Respiratory:  Negative for chest tightness and cough.   Cardiovascular:  Negative for chest pain.  Gastrointestinal:  Negative for abdominal distention, abdominal pain, blood in stool and nausea.  Endocrine: Negative for hot flashes.  Genitourinary:  Negative for difficulty urinating and frequency.   Musculoskeletal:  Negative for arthralgias.       Chronic intermittent back pain  Skin:  Negative for itching and rash.  Neurological:  Negative for extremity weakness.  Hematological:  Negative for adenopathy.  Psychiatric/Behavioral:  Negative for confusion.     Performance status (ECOG): 0 - Asymptomatic  Vital Signs BP (!) 146/66 Comment: Rechecked BP. Patient is aware of high BP readings and states that BP is usually high when she gets here and goes down after her treatment.  Pulse 63   Temp (!) 96.4 F (35.8 C) (Tympanic)   Resp 18   Wt 131 lb 8 oz (59.6 kg)   SpO2 95%   BMI 24.05 kg/m   Physical  Exam Constitutional:  General: She is not in acute distress.    Appearance: She is not diaphoretic.  HENT:     Head: Normocephalic and atraumatic.  Eyes:     General: No scleral icterus. Cardiovascular:     Rate and Rhythm: Normal rate and regular rhythm.  Pulmonary:     Effort: Pulmonary effort is normal. No respiratory distress.  Abdominal:     General: There is no distension.     Palpations: Abdomen is soft.     Tenderness: There is no abdominal tenderness.  Musculoskeletal:        General: Normal range of motion.     Cervical back: Normal range of motion and neck supple.  Skin:    General: Skin is dry.     Findings: Lesion present. No erythema.  Neurological:     Mental Status: She is alert and oriented to person, place, and time. Mental status is at baseline.  Psychiatric:        Mood and Affect: Mood and affect normal.     RADIOGRAPHIC STUDIES: I have personally reviewed the radiological images as listed and agreed with the findings in the report. DG OR UROLOGY CYSTO IMAGE ([facility] ONLY) Result Date: 12/29/2023 There is no interpretation for this exam.  This order is for images obtained during a surgical procedure.  Please See Surgeries Tab for more information regarding the procedure.   CT Chest Wo Contrast Result Date: 11/27/2023 CLINICAL DATA:  History of right upper lobe adenocarcinoma status post wedge resection 03/13/2014 and left lower lobe adenocarcinoma status post wedge resection 03/10/2017 with locally recurrent non-small-cell lung cancer status post chemoradiation, now on immunotherapy. * Tracking Code: BO * EXAM: CT CHEST WITHOUT CONTRAST TECHNIQUE: Multidetector CT imaging of the chest was performed following the standard protocol without IV contrast. RADIATION DOSE REDUCTION: This exam was performed according to the departmental dose-optimization program which includes automated exposure control, adjustment of the mA and/or kV according to patient size  and/or use of iterative reconstruction technique. COMPARISON:  CT chest dated 07/27/2023 FINDINGS: Cardiovascular: Normal heart size. No significant pericardial fluid/thickening. Great vessels are normal in course and caliber. Coronary artery calcifications and aortic atherosclerosis. Mediastinum/Nodes: Imaged thyroid  gland without nodules meeting criteria for imaging follow-up by size. Small hiatal hernia. Decreased size of previously hypermetabolic precarinal lymph node measuring 5 mm (2:57), previously 10 mm. New lymphadenopathy. Lungs/Pleura: The central airways are patent. Postsurgical changes of the right upper and left lower lobes. Biapical pleural-parenchymal scarring. Mild-to-moderate centrilobular and paraseptal emphysema. Unchanged architectural distortion in the medial left upper lobe (4:33). Scattered pulmonary nodules are also unchanged: -8 x 6 mm posteromedial right upper lobe (4:58) -6 mm perifissural left upper lobe (4:85) -3 mm anterior right middle lobe (4:95) -8 x 6 mm basilar left lower lobe (4:124) Unchanged posterior right lower lobe ground-glass nodule measures 1.7 x 1.4 cm (4:86). No pneumothorax. No pleural effusion. Upper abdomen: Unchanged hepatic hypodensities, likely cysts. Musculoskeletal: No acute or abnormal lytic or blastic osseous lesions. Multilevel degenerative changes of the thoracic spine. IMPRESSION: 1. Continued decrease in size of previously hypermetabolic precarinal lymph node. No new lymphadenopathy. 2. Unchanged bilateral solid pulmonary nodules and right lower lobe ground-glass nodule. 3. Aortic Atherosclerosis (ICD10-I70.0) and Emphysema (ICD10-J43.9). Coronary artery calcifications. Assessment for potential risk factor modification, dietary therapy or pharmacologic therapy may be warranted, if clinically indicated. Electronically Signed   By: Limin  Xu M.D.   On: 11/27/2023 12:09    Laboratory results were reviewed by me    Latest  Ref Rng & Units 02/22/2024     8:37 AM 02/07/2024   10:40 AM 01/24/2024    9:57 AM  CBC  WBC 4.0 - 10.5 K/uL 6.2  5.0  5.5   Hemoglobin 12.0 - 15.0 g/dL 85.8  86.7  85.6   Hematocrit 36.0 - 46.0 % 42.3  39.5  43.5   Platelets 150 - 400 K/uL 227  216  166       Latest Ref Rng & Units 02/22/2024    8:37 AM 02/07/2024   10:40 AM 01/24/2024    9:57 AM  CMP  Glucose 70 - 99 mg/dL 898  871  91   BUN 8 - 23 mg/dL 13  17  15    Creatinine 0.44 - 1.00 mg/dL 9.25  9.17  9.17   Sodium 135 - 145 mmol/L 142  141  141   Potassium 3.5 - 5.1 mmol/L 3.9  3.9  4.3   Chloride 98 - 111 mmol/L 104  106  104   CO2 22 - 32 mmol/L 25  24  25    Calcium  8.9 - 10.3 mg/dL 9.7  9.5  9.2   Total Protein 6.5 - 8.1 g/dL 6.8  6.2  6.3   Total Bilirubin 0.0 - 1.2 mg/dL 0.4  0.3  0.5   Alkaline Phos 38 - 126 U/L 69  62  54   AST 15 - 41 U/L 27  24  22    ALT 0 - 44 U/L 16  15  14

## 2024-02-22 NOTE — Assessment & Plan Note (Signed)
 Immunotherapy plan as listed above

## 2024-02-22 NOTE — Assessment & Plan Note (Signed)
 Normal TSH T4 2 weeks ago.  Recommend patient to take vitamin D  and B12 supplements

## 2024-03-07 ENCOUNTER — Inpatient Hospital Stay

## 2024-03-07 ENCOUNTER — Inpatient Hospital Stay: Admitting: Oncology

## 2024-03-21 ENCOUNTER — Inpatient Hospital Stay

## 2024-03-21 ENCOUNTER — Inpatient Hospital Stay: Admitting: Oncology

## 2024-07-02 ENCOUNTER — Ambulatory Visit: Admitting: Urology
